# Patient Record
Sex: Female | Born: 1940 | Race: White | Hispanic: No | Marital: Married | Smoking: Never smoker
Health system: Southern US, Community
[De-identification: ages and names within clinical notes are randomized; demographics above are authoritative.]

## PROBLEM LIST (undated history)

## (undated) DIAGNOSIS — M51369 Other intervertebral disc degeneration, lumbar region without mention of lumbar back pain or lower extremity pain: Secondary | ICD-10-CM

## (undated) DIAGNOSIS — I4891 Unspecified atrial fibrillation: Secondary | ICD-10-CM

## (undated) DIAGNOSIS — G4730 Sleep apnea, unspecified: Secondary | ICD-10-CM

## (undated) DIAGNOSIS — R7303 Prediabetes: Secondary | ICD-10-CM

## (undated) DIAGNOSIS — Z87442 Personal history of urinary calculi: Secondary | ICD-10-CM

## (undated) DIAGNOSIS — Z8673 Personal history of transient ischemic attack (TIA), and cerebral infarction without residual deficits: Secondary | ICD-10-CM

## (undated) DIAGNOSIS — H9190 Unspecified hearing loss, unspecified ear: Secondary | ICD-10-CM

## (undated) DIAGNOSIS — I6789 Other cerebrovascular disease: Secondary | ICD-10-CM

## (undated) DIAGNOSIS — Z7901 Long term (current) use of anticoagulants: Secondary | ICD-10-CM

## (undated) DIAGNOSIS — F329 Major depressive disorder, single episode, unspecified: Secondary | ICD-10-CM

## (undated) DIAGNOSIS — K5790 Diverticulosis of intestine, part unspecified, without perforation or abscess without bleeding: Secondary | ICD-10-CM

## (undated) DIAGNOSIS — R011 Cardiac murmur, unspecified: Secondary | ICD-10-CM

## (undated) DIAGNOSIS — E785 Hyperlipidemia, unspecified: Secondary | ICD-10-CM

## (undated) DIAGNOSIS — M7122 Synovial cyst of popliteal space [Baker], left knee: Secondary | ICD-10-CM

## (undated) DIAGNOSIS — J4540 Moderate persistent asthma, uncomplicated: Secondary | ICD-10-CM

## (undated) DIAGNOSIS — F32A Depression, unspecified: Secondary | ICD-10-CM

## (undated) DIAGNOSIS — K219 Gastro-esophageal reflux disease without esophagitis: Secondary | ICD-10-CM

## (undated) DIAGNOSIS — M5030 Other cervical disc degeneration, unspecified cervical region: Secondary | ICD-10-CM

## (undated) DIAGNOSIS — I10 Essential (primary) hypertension: Secondary | ICD-10-CM

## (undated) DIAGNOSIS — I639 Cerebral infarction, unspecified: Secondary | ICD-10-CM

## (undated) DIAGNOSIS — K224 Dyskinesia of esophagus: Secondary | ICD-10-CM

## (undated) DIAGNOSIS — M1990 Unspecified osteoarthritis, unspecified site: Secondary | ICD-10-CM

## (undated) HISTORY — DX: Cerebral infarction, unspecified: I63.9

## (undated) HISTORY — PX: TONSILLECTOMY: SUR1361

## (undated) HISTORY — PX: CARDIAC CATHETERIZATION: SHX172

## (undated) HISTORY — DX: Unspecified atrial fibrillation: I48.91

## (undated) HISTORY — PX: COLONOSCOPY: SHX174

## (undated) SURGERY — Surgical Case
Anesthesia: *Unknown

---

## 2004-09-29 ENCOUNTER — Ambulatory Visit: Payer: Self-pay | Admitting: Internal Medicine

## 2005-11-28 ENCOUNTER — Ambulatory Visit: Payer: Self-pay | Admitting: Internal Medicine

## 2006-12-06 ENCOUNTER — Ambulatory Visit: Payer: Self-pay | Admitting: Internal Medicine

## 2007-01-02 ENCOUNTER — Ambulatory Visit: Payer: Self-pay | Admitting: Unknown Physician Specialty

## 2007-01-02 HISTORY — PX: COLONOSCOPY: SHX174

## 2007-07-28 ENCOUNTER — Inpatient Hospital Stay: Payer: Self-pay | Admitting: Internal Medicine

## 2007-07-28 ENCOUNTER — Other Ambulatory Visit: Payer: Self-pay

## 2007-08-02 ENCOUNTER — Ambulatory Visit: Payer: Self-pay | Admitting: Internal Medicine

## 2007-10-09 ENCOUNTER — Ambulatory Visit: Payer: Self-pay | Admitting: Unknown Physician Specialty

## 2007-10-09 HISTORY — PX: ESOPHAGOGASTRODUODENOSCOPY: SHX1529

## 2008-01-08 ENCOUNTER — Ambulatory Visit: Payer: Self-pay | Admitting: Internal Medicine

## 2009-01-08 ENCOUNTER — Ambulatory Visit: Payer: Self-pay | Admitting: Internal Medicine

## 2010-03-17 ENCOUNTER — Ambulatory Visit: Payer: Self-pay | Admitting: Internal Medicine

## 2010-11-23 ENCOUNTER — Ambulatory Visit: Payer: Self-pay | Admitting: Internal Medicine

## 2011-04-04 ENCOUNTER — Ambulatory Visit: Payer: Self-pay | Admitting: Internal Medicine

## 2012-04-04 ENCOUNTER — Ambulatory Visit: Payer: Self-pay | Admitting: Family Medicine

## 2013-05-05 ENCOUNTER — Ambulatory Visit: Payer: Self-pay | Admitting: Family Medicine

## 2014-05-06 ENCOUNTER — Ambulatory Visit: Payer: Self-pay | Admitting: Family Medicine

## 2014-11-13 DIAGNOSIS — Z9841 Cataract extraction status, right eye: Secondary | ICD-10-CM

## 2014-11-13 HISTORY — DX: Cataract extraction status, right eye: Z98.41

## 2015-09-09 ENCOUNTER — Ambulatory Visit: Payer: Medicare PPO | Admitting: Certified Registered Nurse Anesthetist

## 2015-09-09 ENCOUNTER — Encounter
Admission: RE | Disposition: A | Discharge status: Home / Self Care | Payer: Self-pay | Source: Ambulatory Visit | Attending: Ophthalmology

## 2015-09-09 ENCOUNTER — Ambulatory Visit
Admission: RE | Admit: 2015-09-09 | Discharge: 2015-09-09 | Disposition: A | Discharge status: Home / Self Care | Payer: Medicare PPO | Source: Ambulatory Visit | Attending: Ophthalmology | Admitting: Ophthalmology

## 2015-09-09 ENCOUNTER — Encounter: Payer: Self-pay | Admitting: *Deleted

## 2015-09-09 DIAGNOSIS — H269 Unspecified cataract: Secondary | ICD-10-CM | POA: Diagnosis present

## 2015-09-09 DIAGNOSIS — H9190 Unspecified hearing loss, unspecified ear: Secondary | ICD-10-CM | POA: Insufficient documentation

## 2015-09-09 DIAGNOSIS — Z7982 Long term (current) use of aspirin: Secondary | ICD-10-CM | POA: Insufficient documentation

## 2015-09-09 DIAGNOSIS — Z882 Allergy status to sulfonamides status: Secondary | ICD-10-CM | POA: Diagnosis not present

## 2015-09-09 DIAGNOSIS — F329 Major depressive disorder, single episode, unspecified: Secondary | ICD-10-CM | POA: Diagnosis not present

## 2015-09-09 DIAGNOSIS — Z9889 Other specified postprocedural states: Secondary | ICD-10-CM | POA: Insufficient documentation

## 2015-09-09 DIAGNOSIS — I10 Essential (primary) hypertension: Secondary | ICD-10-CM | POA: Diagnosis not present

## 2015-09-09 DIAGNOSIS — K219 Gastro-esophageal reflux disease without esophagitis: Secondary | ICD-10-CM | POA: Insufficient documentation

## 2015-09-09 DIAGNOSIS — H2512 Age-related nuclear cataract, left eye: Secondary | ICD-10-CM | POA: Diagnosis not present

## 2015-09-09 DIAGNOSIS — Z79899 Other long term (current) drug therapy: Secondary | ICD-10-CM | POA: Diagnosis not present

## 2015-09-09 HISTORY — DX: Gastro-esophageal reflux disease without esophagitis: K21.9

## 2015-09-09 HISTORY — DX: Cardiac murmur, unspecified: R01.1

## 2015-09-09 HISTORY — DX: Dyskinesia of esophagus: K22.4

## 2015-09-09 HISTORY — DX: Depression, unspecified: F32.A

## 2015-09-09 HISTORY — PX: CATARACT EXTRACTION W/PHACO: SHX586

## 2015-09-09 HISTORY — DX: Essential (primary) hypertension: I10

## 2015-09-09 HISTORY — DX: Major depressive disorder, single episode, unspecified: F32.9

## 2015-09-09 HISTORY — DX: Unspecified hearing loss, unspecified ear: H91.90

## 2015-09-09 SURGERY — PHACOEMULSIFICATION, CATARACT, WITH IOL INSERTION
Anesthesia: Monitor Anesthesia Care | Laterality: Left

## 2015-09-09 MED ORDER — BALANCED SALT IO SOLN
INTRAOCULAR | Status: DC | PRN
  Administered 2015-09-09: 200 mL via INTRAOCULAR

## 2015-09-09 MED ORDER — TETRACAINE HCL 0.5 % OP SOLN
1.0000 [drp] | Freq: Once | OPHTHALMIC | Status: AC
  Administered 2015-09-09: 1 [drp] via OPHTHALMIC

## 2015-09-09 MED ORDER — CYCLOPENTOLATE HCL 2 % OP SOLN
1.0000 [drp] | OPHTHALMIC | Status: DC
  Administered 2015-09-09 (×4): 1 [drp] via OPHTHALMIC

## 2015-09-09 MED ORDER — PHENYLEPHRINE HCL 10 % OP SOLN
OPHTHALMIC | Status: AC
  Administered 2015-09-09: 1 [drp] via OPHTHALMIC
  Filled 2015-09-09: qty 5

## 2015-09-09 MED ORDER — CYCLOPENTOLATE HCL 2 % OP SOLN
OPHTHALMIC | Status: AC
  Administered 2015-09-09: 1 [drp] via OPHTHALMIC
  Filled 2015-09-09: qty 2

## 2015-09-09 MED ORDER — SODIUM CHLORIDE 0.9 % IV SOLN
INTRAVENOUS | Status: DC
  Administered 2015-09-09: 07:00:00 via INTRAVENOUS

## 2015-09-09 MED ORDER — NA HYALUR & NA CHOND-NA HYALUR 0.4-0.35 ML IO KIT
PACK | INTRAOCULAR | Status: DC | PRN
  Administered 2015-09-09: 1 mL via INTRAOCULAR

## 2015-09-09 MED ORDER — EPINEPHRINE HCL 1 MG/ML IJ SOLN
INTRAMUSCULAR | Status: AC
  Filled 2015-09-09: qty 1

## 2015-09-09 MED ORDER — MOXIFLOXACIN HCL 0.5 % OP SOLN
OPHTHALMIC | Status: DC | PRN
  Administered 2015-09-09: 2 [drp] via OPHTHALMIC

## 2015-09-09 MED ORDER — CEFUROXIME OPHTHALMIC INJECTION 1 MG/0.1 ML
INJECTION | OPHTHALMIC | Status: DC | PRN
  Administered 2015-09-09: 0.1 mL via INTRACAMERAL

## 2015-09-09 MED ORDER — TETRACAINE HCL 0.5 % OP SOLN
OPHTHALMIC | Status: AC
  Administered 2015-09-09: 1 [drp] via OPHTHALMIC
  Filled 2015-09-09: qty 2

## 2015-09-09 MED ORDER — LIDOCAINE HCL (PF) 4 % IJ SOLN
INTRAMUSCULAR | Status: AC
  Filled 2015-09-09: qty 5

## 2015-09-09 MED ORDER — PHENYLEPHRINE HCL 10 % OP SOLN
1.0000 [drp] | OPHTHALMIC | Status: DC
  Administered 2015-09-09 (×4): 1 [drp] via OPHTHALMIC

## 2015-09-09 MED ORDER — NA CHONDROIT SULF-NA HYALURON 40-30 MG/ML IO SOLN
INTRAOCULAR | Status: DC | PRN
  Administered 2015-09-09: 0.75 mL via INTRAOCULAR

## 2015-09-09 MED ORDER — CEFUROXIME OPHTHALMIC INJECTION 1 MG/0.1 ML
INJECTION | OPHTHALMIC | Status: AC
  Filled 2015-09-09: qty 0.1

## 2015-09-09 MED ORDER — LIDOCAINE HCL (PF) 4 % IJ SOLN
INTRAOCULAR | Status: DC | PRN
  Administered 2015-09-09: .5 mL via OPHTHALMIC

## 2015-09-09 MED ORDER — FENTANYL CITRATE (PF) 100 MCG/2ML IJ SOLN
INTRAMUSCULAR | Status: DC | PRN
  Administered 2015-09-09: 50 ug via INTRAVENOUS

## 2015-09-09 MED ORDER — MOXIFLOXACIN HCL 0.5 % OP SOLN
1.0000 [drp] | OPHTHALMIC | Status: DC
  Administered 2015-09-09 (×3): 1 [drp] via OPHTHALMIC

## 2015-09-09 MED ORDER — MIDAZOLAM HCL 2 MG/2ML IJ SOLN
INTRAMUSCULAR | Status: DC | PRN
  Administered 2015-09-09: 1 mg via INTRAVENOUS

## 2015-09-09 MED ORDER — MOXIFLOXACIN HCL 0.5 % OP SOLN
OPHTHALMIC | Status: AC
  Administered 2015-09-09: 1 [drp] via OPHTHALMIC
  Filled 2015-09-09: qty 3

## 2015-09-09 MED ORDER — NA HYALUR & NA CHOND-NA HYALUR 0.55-0.5 ML IO KIT
PACK | INTRAOCULAR | Status: AC
  Filled 2015-09-09: qty 1.05

## 2015-09-09 SURGICAL SUPPLY — 22 items
CANNULA ANT/CHMB 27GA (MISCELLANEOUS) ×3 IMPLANT
CUP MEDICINE 2OZ PLAST GRAD ST (MISCELLANEOUS) ×3 IMPLANT
GLOVE BIO SURGEON STRL SZ7 (GLOVE) ×3 IMPLANT
GLOVE SURG LX 6.5 MICRO (GLOVE) ×4
GLOVE SURG LX STRL 6.5 MICRO (GLOVE) ×2 IMPLANT
GOWN STRL REUS W/ TWL LRG LVL3 (GOWN DISPOSABLE) ×2 IMPLANT
GOWN STRL REUS W/TWL LRG LVL3 (GOWN DISPOSABLE) ×4
LENS IOL ACRSF IQ PC 15.0 (Intraocular Lens) ×1 IMPLANT
LENS IOL ACRYSOF IQ POST 15.0 (Intraocular Lens) ×3 IMPLANT
NEEDLE FILTER BLUNT 18X 1/2SAF (NEEDLE) ×2
NEEDLE FILTER BLUNT 18X1 1/2 (NEEDLE) ×1 IMPLANT
PACK CATARACT (MISCELLANEOUS) ×3 IMPLANT
PACK CATARACT BRASINGTON LX (MISCELLANEOUS) ×3 IMPLANT
PACK EYE AFTER SURG (MISCELLANEOUS) ×3 IMPLANT
SOL BSS BAG (MISCELLANEOUS) ×3
SOL PREP PVP 2OZ (MISCELLANEOUS) ×3
SOLUTION BSS BAG (MISCELLANEOUS) ×1 IMPLANT
SOLUTION PREP PVP 2OZ (MISCELLANEOUS) ×1 IMPLANT
SYR 3ML LL SCALE MARK (SYRINGE) ×6 IMPLANT
SYR TB 1ML 27GX1/2 LL (SYRINGE) ×3 IMPLANT
WATER STERILE IRR 1000ML POUR (IV SOLUTION) ×3 IMPLANT
WIPE NON LINTING 3.25X3.25 (MISCELLANEOUS) ×3 IMPLANT

## 2015-09-09 NOTE — H&P (Signed)
The history and physical was faxed to the hospital. The history and physical was reviewed by me and no changes have occurred.   

## 2015-09-09 NOTE — Anesthesia Postprocedure Evaluation (Signed)
  Anesthesia Post-op Note  Patient: Emma Rivera  Procedure(s) Performed: Procedure(s) with comments: CATARACT EXTRACTION PHACO AND INTRAOCULAR LENS PLACEMENT (IOC) (Left) - US   1:10.5 AP     8.6 CDE  6.05 casette lot #  [id number]  Anesthesia type:MAC  Patient location: PACU  Post pain: Pain level controlled  Post assessment: Post-op Vital signs reviewed, Patient's Cardiovascular Status Stable, Respiratory Function Stable, Patent Airway and No signs of Nausea or vomiting  Post vital signs: Reviewed and stable  Last Vitals:  Filed Vitals:   09/09/15 0851  BP: 147/67  Pulse: 57  Temp: 36.8 C  Resp: 16    Level of consciousness: awake, alert  and patient cooperative  Complications: No apparent anesthesia complications  

## 2015-09-09 NOTE — Anesthesia Preprocedure Evaluation (Signed)
Anesthesia Evaluation  Patient identified by MRN, date of birth, ID band Patient awake    Airway Mallampati: III  TM Distance: >3 FB     Dental  (+) Teeth Intact   Pulmonary    breath sounds clear to auscultation       Cardiovascular hypertension,  Rhythm:Regular Rate:Normal     Neuro/Psych    GI/Hepatic   Endo/Other    Renal/GU      Musculoskeletal   Abdominal   Peds  Hematology   Anesthesia Other Findings   Reproductive/Obstetrics                             Anesthesia Physical Anesthesia Plan  ASA: II  Anesthesia Plan: MAC   Post-op Pain Management:    Induction:   Airway Management Planned: Nasal Cannula  Additional Equipment:   Intra-op Plan:   Post-operative Plan:   Informed Consent: I have reviewed the patients History and Physical, chart, labs and discussed the procedure including the risks, benefits and alternatives for the proposed anesthesia with the patient or authorized representative who has indicated his/her understanding and acceptance.     Plan Discussed with:   Anesthesia Plan Comments:         Anesthesia Quick Evaluation

## 2015-09-09 NOTE — Op Note (Signed)
09/09/2015  PRE-OP DIAGNOSIS: Cataract (ICD-10 H25.12) Nuclear sclerotic cataract, LEFT EYE  Post operative diagnosis: Cataract (ICD-10 H25.12) Nuclear sclerotic cataract, LEFT EYE  Procedure: Phacoemulsification with introcular lens [id number])   SURGEON: Surgeon(s) and Role:    * Lia HoppingNisha Marnita Poirier, MD - Primary  ANESTHESIA: Choice   ESTIMATED BLOOD LOSS: MINIMAL  COMPLICATIONS: None  OPERATIVE DESCRIPTION:  Therapeutic options were discussed with the patient preoperatively, including a discussion of risks and benefits of surgery.  Informed consent was obtained. A dilated fundus exam was performed within 6 months.   The patient was premedicated and brought to the operating room and placed on the operating table in the supine position.  Topical tetracaine was instilled.  After adequate anesthesia, the patient was prepped and draped in the usual fashion.  A wire lid speculum was inserted and the microscope was positioned.  A sideport was used to create a paracentesis site and a mixture of preservative-free lidocaine, BSS, and epinephrine was was instilled into the anterior chamber, followed by viscoelastic.  A clear corneal incision was created using a keratome blade.  Capsulorrhexis was then performed.  In situ phacoemulsification was performed.  Cortical material was removed with the irrigation-aspiration unit.  Viscoelastic was instilled to open the capsular bag.  A posterior chamber intraocular lens, model 15.0 diopters, was inserted and positioned.  Irrigation-aspiration was used to remove all viscoelastic. Intracameral cefuroxime was injected into the eye. Wounds were checked for leakage and confirmed to be secure.  Lid speculum was removed and a shield was placed over the eye.  Patient was returned to the recovery room in stable condition.  IMPLANTS:   Implant Name Type Inv. Item Serial No. Manufacturer Lot No. LRB No. Used  IMPLANT LENS SN60WF 15.0 - [id number] Intraocular  Lens IMPLANT LENS SN60WF 15.0 1219 ALCON   Left 1     Postoperative care and discharge medication counseling was discussed with the patient or the parents prior to discharge

## 2015-09-09 NOTE — Anesthesia Procedure Notes (Signed)
Procedure Name: MAC Performed by: Cailee Blanke Pre-anesthesia Checklist: Patient identified, Emergency Drugs available, Suction available, Patient being monitored and Timeout performed Oxygen Delivery Method: Nasal cannula       

## 2015-09-09 NOTE — Discharge Instructions (Signed)

## 2015-09-09 NOTE — Anesthesia Postprocedure Evaluation (Signed)
  Anesthesia Post-op Note  Patient: Emma Rivera  Procedure(s) Performed: Procedure(s) with comments: CATARACT EXTRACTION PHACO AND INTRAOCULAR LENS PLACEMENT (IOC) (Left) - US   1:10.5 AP     8.6 CDE  6.05 casette lot #  [id number]  Anesthesia type:MAC  Patient location: PACU  Post pain: Pain level controlled  Post assessment: Post-op Vital signs reviewed, Patient's Cardiovascular Status Stable, Respiratory Function Stable, Patent Airway and No signs of Nausea or vomiting  Post vital signs: Reviewed and stable  Last Vitals:  Filed Vitals:   09/09/15 0851  BP: 147/67  Pulse: 57  Temp: 36.8 C  Resp: 16    Level of consciousness: awake, alert  and patient cooperative  Complications: No apparent anesthesia complications

## 2015-09-09 NOTE — Transfer of Care (Signed)
Immediate Anesthesia Transfer of Care Note  Patient: Emma Rivera  Procedure(s) Performed: Procedure(s) with comments: CATARACT EXTRACTION PHACO AND INTRAOCULAR LENS PLACEMENT (IOC) (Left) - US   1:10.5 AP     8.6 CDE  6.05 casette lot #  [id number]  Patient Location: PACU  Anesthesia Type:MAC  Level of Consciousness: awake, alert  and oriented  Airway & Oxygen Therapy: Patient Spontanous Breathing and Patient connected to nasal cannula oxygen  Post-op Assessment: Report given to RN and Post -op Vital signs reviewed and stable  Post vital signs: Reviewed and stable  Last Vitals:  Filed Vitals:   09/09/15 0821  BP: 153/66  Pulse: 61  Temp: 36.8 C  Resp: 20    Complications: No apparent anesthesia complications

## 2015-09-27 ENCOUNTER — Encounter: Payer: Self-pay | Admitting: *Deleted

## 2015-09-30 ENCOUNTER — Ambulatory Visit: Payer: Medicare PPO | Admitting: Certified Registered Nurse Anesthetist

## 2015-09-30 ENCOUNTER — Ambulatory Visit
Admission: RE | Admit: 2015-09-30 | Discharge: 2015-09-30 | Disposition: A | Discharge status: Home / Self Care | Payer: Medicare PPO | Source: Ambulatory Visit | Attending: Ophthalmology | Admitting: Ophthalmology

## 2015-09-30 ENCOUNTER — Encounter
Admission: RE | Disposition: A | Discharge status: Home / Self Care | Payer: Self-pay | Source: Ambulatory Visit | Attending: Ophthalmology

## 2015-09-30 ENCOUNTER — Encounter: Payer: Self-pay | Admitting: *Deleted

## 2015-09-30 DIAGNOSIS — K219 Gastro-esophageal reflux disease without esophagitis: Secondary | ICD-10-CM | POA: Diagnosis not present

## 2015-09-30 DIAGNOSIS — F329 Major depressive disorder, single episode, unspecified: Secondary | ICD-10-CM | POA: Insufficient documentation

## 2015-09-30 DIAGNOSIS — H9193 Unspecified hearing loss, bilateral: Secondary | ICD-10-CM | POA: Diagnosis not present

## 2015-09-30 DIAGNOSIS — Z7982 Long term (current) use of aspirin: Secondary | ICD-10-CM | POA: Diagnosis not present

## 2015-09-30 DIAGNOSIS — Z79899 Other long term (current) drug therapy: Secondary | ICD-10-CM | POA: Insufficient documentation

## 2015-09-30 DIAGNOSIS — H269 Unspecified cataract: Secondary | ICD-10-CM | POA: Diagnosis present

## 2015-09-30 DIAGNOSIS — Z882 Allergy status to sulfonamides status: Secondary | ICD-10-CM | POA: Diagnosis not present

## 2015-09-30 DIAGNOSIS — I10 Essential (primary) hypertension: Secondary | ICD-10-CM | POA: Insufficient documentation

## 2015-09-30 DIAGNOSIS — R011 Cardiac murmur, unspecified: Secondary | ICD-10-CM | POA: Diagnosis not present

## 2015-09-30 DIAGNOSIS — Z9849 Cataract extraction status, unspecified eye: Secondary | ICD-10-CM | POA: Insufficient documentation

## 2015-09-30 DIAGNOSIS — H2511 Age-related nuclear cataract, right eye: Secondary | ICD-10-CM | POA: Diagnosis not present

## 2015-09-30 HISTORY — PX: CATARACT EXTRACTION W/PHACO: SHX586

## 2015-09-30 SURGERY — PHACOEMULSIFICATION, CATARACT, WITH IOL INSERTION
Anesthesia: Monitor Anesthesia Care | Laterality: Right | Wound class: Clean

## 2015-09-30 MED ORDER — TETRACAINE HCL 0.5 % OP SOLN
1.0000 [drp] | OPHTHALMIC | Status: DC | PRN
  Administered 2015-09-30: 1 [drp] via OPHTHALMIC

## 2015-09-30 MED ORDER — NA HYALUR & NA CHOND-NA HYALUR 0.55-0.5 ML IO KIT
PACK | INTRAOCULAR | Status: AC
  Filled 2015-09-30: qty 1.05

## 2015-09-30 MED ORDER — MIDAZOLAM HCL 2 MG/2ML IJ SOLN
INTRAMUSCULAR | Status: DC | PRN
  Administered 2015-09-30: 0.5 mg via INTRAVENOUS

## 2015-09-30 MED ORDER — BALANCED SALT IO SOLN
INTRAOCULAR | Status: DC | PRN
  Administered 2015-09-30: 200 mL via INTRAOCULAR

## 2015-09-30 MED ORDER — FENTANYL CITRATE (PF) 100 MCG/2ML IJ SOLN
INTRAMUSCULAR | Status: DC | PRN
  Administered 2015-09-30: 25 ug via INTRAVENOUS

## 2015-09-30 MED ORDER — CEFUROXIME OPHTHALMIC INJECTION 1 MG/0.1 ML
INJECTION | OPHTHALMIC | Status: AC
  Filled 2015-09-30: qty 0.1

## 2015-09-30 MED ORDER — PHENYLEPHRINE HCL 10 % OP SOLN
1.0000 [drp] | OPHTHALMIC | Status: AC | PRN
  Administered 2015-09-30 (×4): 1 [drp] via OPHTHALMIC

## 2015-09-30 MED ORDER — PHENYLEPHRINE HCL 10 % OP SOLN
OPHTHALMIC | Status: AC
  Filled 2015-09-30: qty 5

## 2015-09-30 MED ORDER — ONDANSETRON HCL 4 MG/2ML IJ SOLN: 4.0000 mg | Freq: Once | INTRAMUSCULAR | Status: DC | PRN

## 2015-09-30 MED ORDER — FENTANYL CITRATE (PF) 100 MCG/2ML IJ SOLN: 25.0000 ug | INTRAMUSCULAR | Status: DC | PRN

## 2015-09-30 MED ORDER — CYCLOPENTOLATE HCL 2 % OP SOLN
OPHTHALMIC | Status: AC
  Filled 2015-09-30: qty 2

## 2015-09-30 MED ORDER — CYCLOPENTOLATE HCL 2 % OP SOLN
1.0000 [drp] | OPHTHALMIC | Status: AC | PRN
  Administered 2015-09-30 (×4): 1 [drp] via OPHTHALMIC

## 2015-09-30 MED ORDER — CEFUROXIME OPHTHALMIC INJECTION 1 MG/0.1 ML
INJECTION | OPHTHALMIC | Status: DC | PRN
  Administered 2015-09-30: 1 mg via INTRACAMERAL

## 2015-09-30 MED ORDER — MOXIFLOXACIN HCL 0.5 % OP SOLN
OPHTHALMIC | Status: DC | PRN
  Administered 2015-09-30: 1 [drp]

## 2015-09-30 MED ORDER — LIDOCAINE HCL (PF) 4 % IJ SOLN
INTRAOCULAR | Status: DC | PRN
  Administered 2015-09-30: 4 mL

## 2015-09-30 MED ORDER — NA HYALUR & NA CHOND-NA HYALUR 0.4-0.35 ML IO KIT
PACK | INTRAOCULAR | Status: DC | PRN
  Administered 2015-09-30: .35 mL via INTRAOCULAR

## 2015-09-30 MED ORDER — SODIUM CHLORIDE 0.9 % IV SOLN
INTRAVENOUS | Status: DC
  Administered 2015-09-30: 10:00:00 via INTRAVENOUS

## 2015-09-30 MED ORDER — MOXIFLOXACIN HCL 0.5 % OP SOLN
OPHTHALMIC | Status: AC
  Filled 2015-09-30: qty 3

## 2015-09-30 MED ORDER — EPINEPHRINE HCL 1 MG/ML IJ SOLN
INTRAMUSCULAR | Status: AC
  Filled 2015-09-30: qty 1

## 2015-09-30 MED ORDER — MOXIFLOXACIN HCL 0.5 % OP SOLN
1.0000 [drp] | OPHTHALMIC | Status: AC | PRN
  Administered 2015-09-30 (×3): 1 [drp] via OPHTHALMIC

## 2015-09-30 MED ORDER — LIDOCAINE HCL (PF) 4 % IJ SOLN
INTRAMUSCULAR | Status: AC
  Filled 2015-09-30: qty 5

## 2015-09-30 MED ORDER — TETRACAINE HCL 0.5 % OP SOLN
OPHTHALMIC | Status: AC
  Filled 2015-09-30: qty 2

## 2015-09-30 SURGICAL SUPPLY — 22 items
CANNULA ANT/CHMB 27GA (MISCELLANEOUS) ×3 IMPLANT
CUP MEDICINE 2OZ PLAST GRAD ST (MISCELLANEOUS) ×3 IMPLANT
GLOVE BIO SURGEON STRL SZ7 (GLOVE) ×3 IMPLANT
GLOVE SURG LX 6.5 MICRO (GLOVE) ×4
GLOVE SURG LX STRL 6.5 MICRO (GLOVE) ×2 IMPLANT
GOWN STRL REUS W/ TWL LRG LVL3 (GOWN DISPOSABLE) ×2 IMPLANT
GOWN STRL REUS W/TWL LRG LVL3 (GOWN DISPOSABLE) ×4
LENS IOL ACRSF IQ PC 15.5 (Intraocular Lens) ×1 IMPLANT
LENS IOL ACRYSOF IQ POST 15.5 (Intraocular Lens) ×3 IMPLANT
NEEDLE FILTER BLUNT 18X 1/2SAF (NEEDLE) ×2
NEEDLE FILTER BLUNT 18X1 1/2 (NEEDLE) ×1 IMPLANT
PACK CATARACT (MISCELLANEOUS) ×3 IMPLANT
PACK CATARACT BRASINGTON LX (MISCELLANEOUS) ×3 IMPLANT
PACK EYE AFTER SURG (MISCELLANEOUS) ×3 IMPLANT
SOL BSS BAG (MISCELLANEOUS) ×3
SOL PREP PVP 2OZ (MISCELLANEOUS) ×3
SOLUTION BSS BAG (MISCELLANEOUS) ×1 IMPLANT
SOLUTION PREP PVP 2OZ (MISCELLANEOUS) ×1 IMPLANT
SYR 3ML LL SCALE MARK (SYRINGE) ×6 IMPLANT
SYR TB 1ML 27GX1/2 LL (SYRINGE) ×3 IMPLANT
WATER STERILE IRR 1000ML POUR (IV SOLUTION) ×3 IMPLANT
WIPE NON LINTING 3.25X3.25 (MISCELLANEOUS) ×3 IMPLANT

## 2015-09-30 NOTE — H&P (Signed)
The history and physical was faxed to the hospital. The history and physical was reviewed by me and no changes have occurred.   

## 2015-09-30 NOTE — Op Note (Signed)
  09/30/2015  PRE-OP DIAGNOSIS: Cataract (ICD-10 H25.11) Nuclear sclerotic catarct, RIGHT EYE  Post operative diagnosis: Cataract (ICD-10 H25.11) Nuclear sclerotic cataract, RIGHT EYE  Procedure: Phacoemulsification with introcular lens [id number]([id number])   SURGEON: Surgeon(s) and Role:    * Lia HoppingNisha Hamna Asa, MD - Primary  ANESTHESIA: Choice   ESTIMATED BLOOD LOSS: MINIMAL  COMPLICATIONS: None  OPERATIVE DESCRIPTION:   Therapeutic options were discussed with the patient preoperatively, including a discussion of risks and benefits of surgery.  Informed consent was obtained. A dilated fundus exam was performed within 6 months.   The patient was premedicated and brought to the operating room and placed on the operating table in the supine position.  Topical tetracaine was instilled.  After adequate anesthesia, the patient was prepped and draped in the usual fashion.  A wire lid speculum was inserted and the microscope was positioned.  A sideport was used to create a paracentesis site and a mixture of preservative-free lidocaine, BSS, and epinephrine was was instilled into the anterior chamber, followed by viscoelastic.  A clear corneal incision was created using a keratome blade.  Capsulorrhexis was then performed.  In situ phacoemulsification was performed.  Cortical material was removed with the irrigation-aspiration unit.  Viscoelastic was instilled to open the capsular bag.  A posterior chamber intraocular lens, model 15.5 diopters, was inserted and positioned.  Irrigation-aspiration was used to remove all viscoelastic. Intracameral cefuroxime was injected into the eye. Wounds were checked for leakage and confirmed to be secure.  Lid speculum was removed and a shield was placed over the eye.  Patient was returned to the recovery room in stable condition. IMPLANTS:   Implant Name Type Inv. Item Serial No. Manufacturer Lot No. LRB No. Used  IMPLANT LENS SN60WF 15.5 - [id number] Intraocular  Lens IMPLANT LENS SN60WF 15.5 [id number] ALCON   Right 1     Postoperative care and discharge medication counseling was discussed with the patient or the parents prior to discharge

## 2015-09-30 NOTE — Anesthesia Preprocedure Evaluation (Signed)
Anesthesia Evaluation  Patient identified by MRN, date of birth, ID band Patient awake    Reviewed: Allergy & Precautions, NPO status , Patient's Chart, lab work & pertinent test results  Airway Mallampati: II  TM Distance: >3 FB Neck ROM: Full    Dental  (+) Caps   Pulmonary neg pulmonary ROS,    Pulmonary exam normal breath sounds clear to auscultation       Cardiovascular hypertension, Pt. on medications Normal cardiovascular exam+ Valvular Problems/Murmurs   Heart murmur   Neuro/Psych Depression    GI/Hepatic Neg liver ROS, GERD  Medicated and Controlled,  Endo/Other  negative endocrine ROS  Renal/GU negative Renal ROS  negative genitourinary   Musculoskeletal negative musculoskeletal ROS (+)   Abdominal Normal abdominal exam  (+)   Peds negative pediatric ROS (+)  Hematology negative hematology ROS (+)   Anesthesia Other Findings   Reproductive/Obstetrics                             Anesthesia Physical Anesthesia Plan  ASA: II  Anesthesia Plan: MAC   Post-op Pain Management:    Induction: Intravenous  Airway Management Planned: Nasal Cannula  Additional Equipment:   Intra-op Plan:   Post-operative Plan:   Informed Consent: I have reviewed the patients History and Physical, chart, labs and discussed the procedure including the risks, benefits and alternatives for the proposed anesthesia with the patient or authorized representative who has indicated his/her understanding and acceptance.   Dental advisory given  Plan Discussed with: CRNA and Surgeon  Anesthesia Plan Comments:         Anesthesia Quick Evaluation

## 2015-09-30 NOTE — Transfer of Care (Signed)
Immediate Anesthesia Transfer of Care Note  Patient: Emma Rivera  Procedure(s) Performed: Procedure(s) with comments: CATARACT EXTRACTION PHACO AND INTRAOCULAR LENS PLACEMENT (IOC) (Right) - US 01:00.3 AP%: 11.6 CDE: 7.00 FLUID LOT# [id number]  Patient Location: PACU  Anesthesia Type:MAC  Level of Consciousness: awake  Airway & Oxygen Therapy: Patient Spontanous Breathing  Post-op Assessment: Report given to RN and Post -op Vital signs reviewed and stable  Post vital signs: Reviewed and stable  Last Vitals:  Filed Vitals:   09/30/15 1140  BP:   Pulse:   Temp:   Resp: 16    Complications: No apparent anesthesia complications

## 2015-09-30 NOTE — Discharge Instructions (Signed)
AMBULATORY SURGERY  DISCHARGE INSTRUCTIONS   1) The drugs that you were given will stay in your system until tomorrow so for the next 24 hours you should not:  A) Drive an automobile B) Make any legal decisions C) Drink any alcoholic beverage   2) You may resume regular meals tomorrow.  Today it is better to start with liquids and gradually work up to solid foods.  You may eat anything you prefer, but it is better to start with liquids, then soup and crackers, and gradually work up to solid foods.   3) Please notify your doctor immediately if you have any unusual bleeding, trouble breathing, redness and pain at the surgery site, drainage, fever, or pain not relieved by medication.    4) Additional Instructions:   Eye Surgery Discharge Instructions  Expect mild scratchy sensation or mild soreness. DO NOT RUB YOUR EYE!  The day of surgery:  Minimal physical activity, but bed rest is not required  No reading, computer work, or close hand work  No bending, lifting, or straining.  May watch TV  For 24 hours:  No driving, legal decisions, or alcoholic beverages  Safety precautions  Eat anything you prefer: It is better to start with liquids, then soup then solid foods.  _____ Eye patch should be worn until postoperative exam tomorrow.  ____ Solar shield eyeglasses should be worn for comfort in the sunlight/patch while sleeping  Resume all regular medications including aspirin or Coumadin if these were discontinued prior to surgery. You may shower, bathe, shave, or wash your hair. Tylenol may be taken for mild discomfort.  Call your doctor if you experience significant pain, nausea, or vomiting, fever > 101 or other signs of infection. [phone] or [phone] Specific instructions:  Follow-up Information    Follow up with Lia HoppingNisha Mukherjee, MD.   Specialty:  Ophthalmology   Why:  November 18 at 9:35am   Contact information:   [address] [place]  [id number] [phone]          Please contact your physician with any problems or Same Day Surgery at [phone], Monday through Friday 6 am to 4 pm, or [place] [facility] Main number at [phone].

## 2015-09-30 NOTE — Anesthesia Postprocedure Evaluation (Signed)
  Anesthesia Post-op Note  Patient: Emma Rivera  Procedure(s) Performed: Procedure(s) with comments: CATARACT EXTRACTION PHACO AND INTRAOCULAR LENS PLACEMENT (IOC) (Right) - US 01:00.3 AP%: 11.6 CDE: 7.00 FLUID LOT# [id number]  Anesthesia type:MAC  Patient location: PACU  Post pain: Pain level controlled  Post assessment: Post-op Vital signs reviewed, Patient's Cardiovascular Status Stable, Respiratory Function Stable, Patent Airway and No signs of Nausea or vomiting  Post vital signs: Reviewed and stable  Last Vitals:  Filed Vitals:   09/30/15 1140  BP:   Pulse:   Temp:   Resp: 16    Level of consciousness: awake, alert  and patient cooperative  Complications: No apparent anesthesia complications

## 2015-12-21 ENCOUNTER — Other Ambulatory Visit: Payer: Self-pay | Admitting: Family Medicine

## 2015-12-21 DIAGNOSIS — Z1231 Encounter for screening mammogram for malignant neoplasm of breast: Secondary | ICD-10-CM

## 2015-12-31 ENCOUNTER — Ambulatory Visit: Payer: Medicare PPO

## 2016-01-06 ENCOUNTER — Ambulatory Visit
Admission: RE | Admit: 2016-01-06 | Discharge: 2016-01-06 | Disposition: A | Discharge status: Home / Self Care | Payer: Medicare Other | Source: Ambulatory Visit | Attending: Family Medicine | Admitting: Family Medicine

## 2016-01-06 ENCOUNTER — Other Ambulatory Visit: Payer: Self-pay | Admitting: Family Medicine

## 2016-01-06 DIAGNOSIS — Z1231 Encounter for screening mammogram for malignant neoplasm of breast: Secondary | ICD-10-CM | POA: Insufficient documentation

## 2016-11-28 ENCOUNTER — Other Ambulatory Visit: Payer: Self-pay | Admitting: Family Medicine

## 2016-11-28 DIAGNOSIS — Z1231 Encounter for screening mammogram for malignant neoplasm of breast: Secondary | ICD-10-CM

## 2017-01-08 ENCOUNTER — Ambulatory Visit
Admission: RE | Admit: 2017-01-08 | Discharge: 2017-01-08 | Disposition: A | Discharge status: Home / Self Care | Payer: Medicare Other | Source: Ambulatory Visit | Attending: Family Medicine | Admitting: Family Medicine

## 2017-01-08 DIAGNOSIS — Z1231 Encounter for screening mammogram for malignant neoplasm of breast: Secondary | ICD-10-CM

## 2017-04-03 ENCOUNTER — Encounter: Payer: Self-pay | Admitting: *Deleted

## 2017-04-04 ENCOUNTER — Ambulatory Visit: Payer: Medicare Other | Admitting: Anesthesiology

## 2017-04-04 ENCOUNTER — Encounter
Admission: RE | Disposition: A | Discharge status: Home / Self Care | Payer: Self-pay | Source: Ambulatory Visit | Attending: Unknown Physician Specialty

## 2017-04-04 ENCOUNTER — Ambulatory Visit
Admission: RE | Admit: 2017-04-04 | Discharge: 2017-04-04 | Disposition: A | Discharge status: Home / Self Care | Payer: Medicare Other | Source: Ambulatory Visit | Attending: Unknown Physician Specialty | Admitting: Unknown Physician Specialty

## 2017-04-04 DIAGNOSIS — K5730 Diverticulosis of large intestine without perforation or abscess without bleeding: Secondary | ICD-10-CM | POA: Insufficient documentation

## 2017-04-04 DIAGNOSIS — K219 Gastro-esophageal reflux disease without esophagitis: Secondary | ICD-10-CM | POA: Diagnosis not present

## 2017-04-04 DIAGNOSIS — Z1211 Encounter for screening for malignant neoplasm of colon: Secondary | ICD-10-CM | POA: Insufficient documentation

## 2017-04-04 DIAGNOSIS — R7303 Prediabetes: Secondary | ICD-10-CM | POA: Insufficient documentation

## 2017-04-04 DIAGNOSIS — F329 Major depressive disorder, single episode, unspecified: Secondary | ICD-10-CM | POA: Diagnosis not present

## 2017-04-04 DIAGNOSIS — Z79899 Other long term (current) drug therapy: Secondary | ICD-10-CM | POA: Diagnosis not present

## 2017-04-04 DIAGNOSIS — Z7982 Long term (current) use of aspirin: Secondary | ICD-10-CM | POA: Insufficient documentation

## 2017-04-04 DIAGNOSIS — Z803 Family history of malignant neoplasm of breast: Secondary | ICD-10-CM | POA: Insufficient documentation

## 2017-04-04 DIAGNOSIS — K640 First degree hemorrhoids: Secondary | ICD-10-CM | POA: Diagnosis not present

## 2017-04-04 DIAGNOSIS — I10 Essential (primary) hypertension: Secondary | ICD-10-CM | POA: Diagnosis not present

## 2017-04-04 HISTORY — PX: COLONOSCOPY WITH PROPOFOL: SHX5780

## 2017-04-04 HISTORY — DX: Prediabetes: R73.03

## 2017-04-04 SURGERY — COLONOSCOPY WITH PROPOFOL
Anesthesia: General

## 2017-04-04 MED ORDER — SODIUM CHLORIDE 0.9 % IV SOLN: INTRAVENOUS | Status: DC

## 2017-04-04 MED ORDER — GLYCOPYRROLATE 0.2 MG/ML IJ SOLN
INTRAMUSCULAR | Status: AC
  Filled 2017-04-04: qty 1

## 2017-04-04 MED ORDER — PROPOFOL 10 MG/ML IV BOLUS
INTRAVENOUS | Status: AC
  Filled 2017-04-04: qty 20

## 2017-04-04 MED ORDER — GLYCOPYRROLATE 0.2 MG/ML IJ SOLN
INTRAMUSCULAR | Status: DC | PRN
  Administered 2017-04-04: 0.2 mg via INTRAVENOUS

## 2017-04-04 MED ORDER — SODIUM CHLORIDE 0.9 % IV SOLN
INTRAVENOUS | Status: DC
  Administered 2017-04-04: 10:00:00 via INTRAVENOUS

## 2017-04-04 MED ORDER — PROPOFOL 500 MG/50ML IV EMUL
INTRAVENOUS | Status: DC | PRN
  Administered 2017-04-04: 90 ug/kg/min via INTRAVENOUS

## 2017-04-04 MED ORDER — PROPOFOL 10 MG/ML IV BOLUS
INTRAVENOUS | Status: DC | PRN
  Administered 2017-04-04: 80 mg via INTRAVENOUS

## 2017-04-04 NOTE — Anesthesia Preprocedure Evaluation (Signed)
Anesthesia Evaluation  Patient identified by MRN, date of birth, ID band Patient awake    Reviewed: Allergy & Precautions, NPO status , Patient's Chart, lab work & pertinent test results  History of Anesthesia Complications Negative for: history of anesthetic complications  Airway Mallampati: II  TM Distance: >3 FB Neck ROM: Full    Dental  (+) Caps   Pulmonary neg pulmonary ROS,           Cardiovascular Exercise Tolerance: Good hypertension, Pt. on medications (-) angina(-) CAD, (-) Past MI, (-) Cardiac Stents and (-) CABG (-) dysrhythmias + Valvular Problems/Murmurs   Heart murmur   Neuro/Psych PSYCHIATRIC DISORDERS    GI/Hepatic Neg liver ROS, GERD  Medicated and Controlled,  Endo/Other  diabetes (Borderline)  Renal/GU negative Renal ROS  negative genitourinary   Musculoskeletal negative musculoskeletal ROS (+)   Abdominal Normal abdominal exam  (+)   Peds negative pediatric ROS (+)  Hematology negative hematology ROS (+)   Anesthesia Other Findings Past Medical History: No date: Depression No date: Esophageal spasm No date: Esophageal spasm No date: GERD (gastroesophageal reflux disease) No date: Heart murmur No date: HOH (hard of hearing) No date: Hypertension No date: Pre-diabetes   Reproductive/Obstetrics negative OB ROS                             Anesthesia Physical  Anesthesia Plan  ASA: II  Anesthesia Plan: General   Post-op Pain Management:    Induction: Intravenous  Airway Management Planned: Nasal Cannula  Additional Equipment:   Intra-op Plan:   Post-operative Plan:   Informed Consent: I have reviewed the patients History and Physical, chart, labs and discussed the procedure including the risks, benefits and alternatives for the proposed anesthesia with the patient or authorized representative who has indicated his/her understanding and acceptance.    Dental advisory given  Plan Discussed with: CRNA and Surgeon  Anesthesia Plan Comments:         Anesthesia Quick Evaluation

## 2017-04-04 NOTE — Transfer of Care (Signed)
Immediate Anesthesia Transfer of Care Note  Patient: Emma Rivera  Procedure(s) Performed: Procedure(s): COLONOSCOPY WITH PROPOFOL (N/A)  Patient Location: PACU and Endoscopy Unit  Anesthesia Type:General  Level of Consciousness: awake, alert  and oriented  Airway & Oxygen Therapy: Patient Spontanous Breathing and Patient connected to nasal cannula oxygen  Post-op Assessment: Report given to RN and Post -op Vital signs reviewed and stable  Post vital signs: Reviewed and stable  Last Vitals:  Vitals:   04/04/17 1123  BP: (!) 97/40  Pulse: (!) 48  Resp: 14  Temp: (!) 35.8 C    Last Pain:  Vitals:   04/04/17 1123  TempSrc: Tympanic         Complications: No apparent anesthesia complications

## 2017-04-04 NOTE — H&P (Signed)
Primary Care Physician:  Marisue IvanLinthavong, Kanhka, MD Primary Gastroenterologist:  Dr. Mechele CollinElliott  Pre-Procedure History & Physical: HPI:  Emma Rivera is a 76 y.o. female is here for an colonoscopy.   Past Medical History:  Diagnosis Date  . Depression   . Esophageal spasm   . Esophageal spasm   . GERD (gastroesophageal reflux disease)   . Heart murmur   . HOH (hard of hearing)   . Hypertension   . Pre-diabetes     Past Surgical History:  Procedure Laterality Date  . CARDIAC CATHETERIZATION    . CATARACT EXTRACTION W/PHACO Left 09/09/2015   Procedure: CATARACT EXTRACTION PHACO AND INTRAOCULAR LENS PLACEMENT (IOC);  Surgeon: Lia HoppingNisha Mukherjee, MD;  Location: [facility] ORS;  Service: Ophthalmology;  Laterality: Left;  US   1:10.5 AP     8.6 CDE  6.05 casette lot #  [id number]  . CATARACT EXTRACTION W/PHACO Right 09/30/2015   Procedure: CATARACT EXTRACTION PHACO AND INTRAOCULAR LENS PLACEMENT (IOC);  Surgeon: Lia HoppingNisha Mukherjee, MD;  Location: [facility] ORS;  Service: Ophthalmology;  Laterality: Right;  US 01:00.3 AP%: 11.6 CDE: 7.00 FLUID LOT# [id number] H  . COLONOSCOPY    . TONSILLECTOMY      Prior to Admission medications   Medication Sig Start Date End Date Taking? Authorizing Provider  amLODipine (NORVASC) 5 MG tablet Take 5 mg by mouth daily.   Yes [provider]  aspirin 81 MG tablet Take 81 mg by mouth daily.   Yes [provider]  citalopram (CELEXA) 20 MG tablet Take 20 mg by mouth daily.   Yes [provider]  losartan-hydrochlorothiazide (HYZAAR) 100-25 MG tablet Take 1 tablet by mouth daily.   Yes [provider]  omeprazole (PRILOSEC) 20 MG capsule Take 20 mg by mouth daily.   Yes [provider]  tolterodine (DETROL LA) 4 MG 24 hr capsule Take 4 mg by mouth daily.   Yes [provider]  Multiple Vitamin (MULTIVITAMIN) tablet Take 1 tablet by mouth daily.    [provider]    Allergies as of 01/22/2017 - Review Complete  09/30/2015  Allergen Reaction Noted  . Sulfa antibiotics  09/03/2015    Family History  Problem Relation Age of Onset  . Breast cancer Maternal Aunt     Social History   Social History  . Marital status: Married    Spouse name: N/A  . Number of children: N/A  . Years of education: N/A   Occupational History  . Not on file.   Social History Main Topics  . Smoking status: Never Smoker  . Smokeless tobacco: Never Used  . Alcohol use Yes     Comment: occasional  . Drug use: No  . Sexual activity: Not on file   Other Topics Concern  . Not on file   Social History Narrative  . No narrative on file    Review of Systems: See HPI, otherwise negative ROS  Physical Exam: There were no vitals taken for this visit. General:   Alert,  pleasant and cooperative in NAD Head:  Normocephalic and atraumatic. Neck:  Supple; no masses or thyromegaly. Lungs:  Clear throughout to auscultation.    Heart:  Regular rate and rhythm. Abdomen:  Soft, nontender and nondistended. Normal bowel sounds, without guarding, and without rebound.   Neurologic:  Alert and  oriented x4;  grossly normal neurologically.  Impression/Plan: Emma Rivera is here for an colonoscopy to be performed for screening colonoscopy  Risks, benefits, limitations, and alternatives regarding  colonoscopy have been reviewed with the patient.  Questions have been answered.  All parties agreeable.   Lynnae Prude, MD  04/04/2017, 11:01 AM

## 2017-04-04 NOTE — Op Note (Signed)
[facility] Gastroenterology Patient Name: Emma Rivera Procedure Date: 04/04/2017 11:01 AM MRN: [id number] Account #: [id number] Date of Birth: December 05, 1940 Admit Type: Outpatient Age: 77 Room: [facility] ENDO ROOM 3 Gender: Female Note Status: Finalized Procedure:            Colonoscopy Indications:          Screening for colorectal malignant neoplasm Providers:            Scot Jun, MD Referring MD:         [name] (Referring MD) Medicines:            Propofol per Anesthesia Complications:        No immediate complications. Procedure:            Pre-Anesthesia Assessment:                       - After reviewing the risks and benefits, the patient                        was deemed in satisfactory condition to undergo the                        procedure.                       After obtaining informed consent, the colonoscope was                        passed under direct vision. Throughout the procedure,                        the patient's blood pressure, pulse, and oxygen                        saturations were monitored continuously. The                        Colonoscope was introduced through the anus and                        advanced to the the cecum, identified by appendiceal                        orifice and ileocecal valve. The colonoscopy was                        performed without difficulty. The patient tolerated the                        procedure well. The quality of the bowel preparation                        was excellent. Findings:      Multiple small-medium-mouthed diverticula were found in the sigmoid       colon, descending colon, transverse colon and ascending colon.      Internal hemorrhoids were found during endoscopy. The hemorrhoids were       small and Grade I (internal hemorrhoids that do not prolapse).      The exam was otherwise without abnormality. Impression:           - Diverticulosis in the  sigmoid colon, in the                        descending colon, in the transverse colon and in the                        ascending colon.                       - Internal hemorrhoids.                       - The examination was otherwise normal.                       - No specimens collected. Recommendation:       No routine repeat colonoscopy needed.                       - The findings and recommendations were discussed with                        the patient's family. Scot Junobert T Kalid Ghan, MD 04/04/2017 11:22:21 AM This report has been signed electronically. Number of Addenda: 0 Note Initiated On: 04/04/2017 11:01 AM Scope Withdrawal Time: 0 hours 6 minutes 42 seconds  Total Procedure Duration: 0 hours 13 minutes 30 seconds       [facility]

## 2017-04-04 NOTE — Anesthesia Post-op Follow-up Note (Cosign Needed)
Anesthesia QCDR form completed.        

## 2017-04-05 ENCOUNTER — Encounter: Payer: Self-pay | Admitting: Unknown Physician Specialty

## 2017-04-05 NOTE — Anesthesia Postprocedure Evaluation (Signed)
Anesthesia Post Note  Patient: Emma Rivera  Procedure(s) Performed: Procedure(s) (LRB): COLONOSCOPY WITH PROPOFOL (N/A)  Patient location during evaluation: Endoscopy Anesthesia Type: General Level of consciousness: awake and alert Pain management: pain level controlled Vital Signs Assessment: post-procedure vital signs reviewed and stable Respiratory status: spontaneous breathing, nonlabored ventilation, respiratory function stable and patient connected to nasal cannula oxygen Cardiovascular status: blood pressure returned to baseline and stable Postop Assessment: no signs of nausea or vomiting Anesthetic complications: no     Last Vitals:  Vitals:   04/04/17 1143 04/04/17 1153  BP: 135/69 (!) 144/61  Pulse: (!) 53 (!) 52  Resp: 17 12  Temp:      Last Pain:  Vitals:   04/05/17 0752  TempSrc:   PainSc: 0-No pain                 Lenard SimmerAndrew Kalandra Masters

## 2017-11-29 ENCOUNTER — Other Ambulatory Visit: Payer: Self-pay | Admitting: Family Medicine

## 2017-11-29 DIAGNOSIS — Z1231 Encounter for screening mammogram for malignant neoplasm of breast: Secondary | ICD-10-CM

## 2018-02-19 ENCOUNTER — Ambulatory Visit
Admission: RE | Admit: 2018-02-19 | Discharge: 2018-02-19 | Disposition: A | Discharge status: Home / Self Care | Payer: Medicare Other | Source: Ambulatory Visit | Attending: Family Medicine | Admitting: Family Medicine

## 2018-02-19 DIAGNOSIS — Z1231 Encounter for screening mammogram for malignant neoplasm of breast: Secondary | ICD-10-CM | POA: Insufficient documentation

## 2018-07-10 ENCOUNTER — Other Ambulatory Visit: Payer: Self-pay | Admitting: Family Medicine

## 2018-07-10 DIAGNOSIS — N644 Mastodynia: Secondary | ICD-10-CM

## 2018-07-18 ENCOUNTER — Ambulatory Visit
Admission: RE | Admit: 2018-07-18 | Discharge: 2018-07-18 | Disposition: A | Discharge status: Home / Self Care | Payer: Medicare Other | Source: Ambulatory Visit | Attending: Family Medicine | Admitting: Family Medicine

## 2018-07-18 DIAGNOSIS — N644 Mastodynia: Secondary | ICD-10-CM

## 2018-12-14 DIAGNOSIS — I639 Cerebral infarction, unspecified: Secondary | ICD-10-CM

## 2018-12-14 DIAGNOSIS — I4891 Unspecified atrial fibrillation: Secondary | ICD-10-CM

## 2018-12-14 HISTORY — DX: Unspecified atrial fibrillation: I48.91

## 2018-12-14 HISTORY — DX: Cerebral infarction, unspecified: I63.9

## 2018-12-16 ENCOUNTER — Inpatient Hospital Stay (HOSPITAL_COMMUNITY)
Admission: EM | Admit: 2018-12-16 | Discharge: 2018-12-18 | DRG: 065 | Disposition: A | Discharge status: Home / Self Care | Payer: Medicare Other | Attending: Neurology | Admitting: Neurology

## 2018-12-16 ENCOUNTER — Emergency Department: Payer: Medicare Other

## 2018-12-16 ENCOUNTER — Other Ambulatory Visit: Payer: Self-pay

## 2018-12-16 ENCOUNTER — Emergency Department
Admission: EM | Admit: 2018-12-16 | Discharge: 2018-12-16 | Disposition: A | Discharge status: Other Acute Inpatient Hospital | Payer: Medicare Other | Attending: Emergency Medicine | Admitting: Emergency Medicine

## 2018-12-16 ENCOUNTER — Emergency Department (HOSPITAL_COMMUNITY): Payer: Medicare Other

## 2018-12-16 ENCOUNTER — Encounter (HOSPITAL_COMMUNITY)
Admission: EM | Disposition: A | Discharge status: Home / Self Care | Payer: Self-pay | Source: Home / Self Care | Attending: Neurology

## 2018-12-16 ENCOUNTER — Encounter (HOSPITAL_COMMUNITY): Payer: Self-pay | Admitting: Certified Registered Nurse Anesthetist

## 2018-12-16 ENCOUNTER — Ambulatory Visit: Admit: 2018-12-16 | Payer: Medicare Other

## 2018-12-16 DIAGNOSIS — I4819 Other persistent atrial fibrillation: Secondary | ICD-10-CM | POA: Diagnosis present

## 2018-12-16 DIAGNOSIS — E66811 Obesity, class 1: Secondary | ICD-10-CM | POA: Diagnosis present

## 2018-12-16 DIAGNOSIS — I63412 Cerebral infarction due to embolism of left middle cerebral artery: Secondary | ICD-10-CM | POA: Diagnosis not present

## 2018-12-16 DIAGNOSIS — R4701 Aphasia: Secondary | ICD-10-CM | POA: Diagnosis present

## 2018-12-16 DIAGNOSIS — Z882 Allergy status to sulfonamides status: Secondary | ICD-10-CM

## 2018-12-16 DIAGNOSIS — Z961 Presence of intraocular lens: Secondary | ICD-10-CM | POA: Diagnosis present

## 2018-12-16 DIAGNOSIS — H9190 Unspecified hearing loss, unspecified ear: Secondary | ICD-10-CM | POA: Diagnosis present

## 2018-12-16 DIAGNOSIS — I6300 Cerebral infarction due to thrombosis of unspecified precerebral artery: Secondary | ICD-10-CM | POA: Diagnosis not present

## 2018-12-16 DIAGNOSIS — Z7982 Long term (current) use of aspirin: Secondary | ICD-10-CM

## 2018-12-16 DIAGNOSIS — Z9842 Cataract extraction status, left eye: Secondary | ICD-10-CM | POA: Diagnosis not present

## 2018-12-16 DIAGNOSIS — R29702 NIHSS score 2: Secondary | ICD-10-CM | POA: Diagnosis present

## 2018-12-16 DIAGNOSIS — Z79899 Other long term (current) drug therapy: Secondary | ICD-10-CM | POA: Diagnosis not present

## 2018-12-16 DIAGNOSIS — E785 Hyperlipidemia, unspecified: Secondary | ICD-10-CM | POA: Diagnosis present

## 2018-12-16 DIAGNOSIS — R011 Cardiac murmur, unspecified: Secondary | ICD-10-CM | POA: Diagnosis present

## 2018-12-16 DIAGNOSIS — I6340 Cerebral infarction due to embolism of unspecified cerebral artery: Secondary | ICD-10-CM

## 2018-12-16 DIAGNOSIS — Z713 Dietary counseling and surveillance: Secondary | ICD-10-CM | POA: Diagnosis not present

## 2018-12-16 DIAGNOSIS — Z9282 Status post administration of tPA (rtPA) in a different facility within the last 24 hours prior to admission to current facility: Secondary | ICD-10-CM | POA: Diagnosis not present

## 2018-12-16 DIAGNOSIS — I63512 Cerebral infarction due to unspecified occlusion or stenosis of left middle cerebral artery: Secondary | ICD-10-CM

## 2018-12-16 DIAGNOSIS — K219 Gastro-esophageal reflux disease without esophagitis: Secondary | ICD-10-CM | POA: Diagnosis present

## 2018-12-16 DIAGNOSIS — I10 Essential (primary) hypertension: Secondary | ICD-10-CM | POA: Insufficient documentation

## 2018-12-16 DIAGNOSIS — E669 Obesity, unspecified: Secondary | ICD-10-CM | POA: Diagnosis present

## 2018-12-16 DIAGNOSIS — Z6833 Body mass index (BMI) 33.0-33.9, adult: Secondary | ICD-10-CM

## 2018-12-16 DIAGNOSIS — I2510 Atherosclerotic heart disease of native coronary artery without angina pectoris: Secondary | ICD-10-CM | POA: Diagnosis present

## 2018-12-16 DIAGNOSIS — I4891 Unspecified atrial fibrillation: Secondary | ICD-10-CM | POA: Diagnosis present

## 2018-12-16 DIAGNOSIS — Z9841 Cataract extraction status, right eye: Secondary | ICD-10-CM | POA: Diagnosis not present

## 2018-12-16 DIAGNOSIS — R7303 Prediabetes: Secondary | ICD-10-CM | POA: Diagnosis present

## 2018-12-16 DIAGNOSIS — I639 Cerebral infarction, unspecified: Secondary | ICD-10-CM | POA: Insufficient documentation

## 2018-12-16 DIAGNOSIS — I361 Nonrheumatic tricuspid (valve) insufficiency: Secondary | ICD-10-CM | POA: Diagnosis not present

## 2018-12-16 HISTORY — DX: Cerebral infarction due to unspecified occlusion or stenosis of left middle cerebral artery: I63.512

## 2018-12-16 LAB — CBC
HCT: 43.3 % (ref 36.0–46.0)
Hemoglobin: 14.2 g/dL (ref 12.0–15.0)
MCH: 32 pg (ref 26.0–34.0)
MCHC: 32.8 g/dL (ref 30.0–36.0)
MCV: 97.5 fL (ref 80.0–100.0)
Platelets: 215 10*3/uL (ref 150–400)
RBC: 4.44 MIL/uL (ref 3.87–5.11)
RDW: 13.1 % (ref 11.5–15.5)
WBC: 9.3 10*3/uL (ref 4.0–10.5)
nRBC: 0 % (ref 0.0–0.2)

## 2018-12-16 LAB — COMPREHENSIVE METABOLIC PANEL
ALK PHOS: 49 U/L (ref 38–126)
ALT: 22 U/L (ref 0–44)
AST: 26 U/L (ref 15–41)
Albumin: 3.9 g/dL (ref 3.5–5.0)
Anion gap: 8 (ref 5–15)
BUN: 19 mg/dL (ref 8–23)
CALCIUM: 9.3 mg/dL (ref 8.9–10.3)
CO2: 27 mmol/L (ref 22–32)
Chloride: 103 mmol/L (ref 98–111)
Creatinine, Ser: 1.06 mg/dL — ABNORMAL HIGH (ref 0.44–1.00)
GFR calc Af Amer: 59 mL/min — ABNORMAL LOW (ref >60)
GFR calc non Af Amer: 51 mL/min — ABNORMAL LOW (ref >60)
Glucose, Bld: 104 mg/dL — ABNORMAL HIGH (ref 70–99)
Potassium: 4 mmol/L (ref 3.5–5.1)
Sodium: 138 mmol/L (ref 135–145)
TOTAL PROTEIN: 7.3 g/dL (ref 6.5–8.1)
Total Bilirubin: 1.3 mg/dL — ABNORMAL HIGH (ref 0.3–1.2)

## 2018-12-16 LAB — DIFFERENTIAL
Abs Immature Granulocytes: 0.03 10*3/uL (ref 0.00–0.07)
Basophils Absolute: 0 10*3/uL (ref 0.0–0.1)
Basophils Relative: 0 %
Eosinophils Absolute: 0.1 10*3/uL (ref 0.0–0.5)
Eosinophils Relative: 1 %
Immature Granulocytes: 0 %
Lymphocytes Relative: 12 %
Lymphs Abs: 1.1 10*3/uL (ref 0.7–4.0)
MONOS PCT: 11 %
Monocytes Absolute: 1.1 10*3/uL — ABNORMAL HIGH (ref 0.1–1.0)
Neutro Abs: 7 10*3/uL (ref 1.7–7.7)
Neutrophils Relative %: 76 %

## 2018-12-16 LAB — PROTIME-INR
INR: 1.06
Prothrombin Time: 13.7 seconds (ref 11.4–15.2)

## 2018-12-16 LAB — APTT: aPTT: 27 seconds (ref 24–36)

## 2018-12-16 LAB — TROPONIN I: Troponin I: <0.03 ng/mL (ref <0.03)

## 2018-12-16 SURGERY — IR WITH ANESTHESIA
Anesthesia: Choice

## 2018-12-16 MED ORDER — HEPARIN (PORCINE) 25000 UT/250ML-% IV SOLN: 800.0000 [IU]/h | INTRAVENOUS | Status: DC

## 2018-12-16 MED ORDER — IOHEXOL 350 MG/ML SOLN
75.0000 mL | Freq: Once | INTRAVENOUS | Status: AC | PRN
  Administered 2018-12-16: 75 mL via INTRAVENOUS

## 2018-12-16 MED ORDER — HEPARIN BOLUS VIA INFUSION
1600.0000 [IU] | Freq: Once | INTRAVENOUS | Status: DC
  Filled 2018-12-16: qty 1600

## 2018-12-16 MED ORDER — ALTEPLASE (STROKE) FULL DOSE INFUSION
0.9000 mg/kg | Freq: Once | INTRAVENOUS | Status: DC
  Administered 2018-12-16: 71.5 mg via INTRAVENOUS

## 2018-12-16 MED ORDER — SODIUM CHLORIDE 0.9 % IV SOLN: 50.0000 mL | Freq: Once | INTRAVENOUS | Status: DC

## 2018-12-16 MED ORDER — ASPIRIN 81 MG PO CHEW
324.0000 mg | CHEWABLE_TABLET | Freq: Once | ORAL | Status: AC
  Administered 2018-12-16: 324 mg via ORAL
  Filled 2018-12-16: qty 4

## 2018-12-16 MED ORDER — ALTEPLASE 100 MG IV SOLR
INTRAVENOUS | Status: AC
  Filled 2018-12-16: qty 100

## 2018-12-16 MED ORDER — STROKE: EARLY STAGES OF RECOVERY BOOK
Freq: Once | Status: AC
  Administered 2018-12-17: 1
  Filled 2018-12-16: qty 1

## 2018-12-16 MED ORDER — CLEVIDIPINE BUTYRATE 0.5 MG/ML IV EMUL: 0.0000 mg/h | INTRAVENOUS | Status: DC

## 2018-12-16 MED ORDER — IOPAMIDOL (ISOVUE-370) INJECTION 76%
100.0000 mL | Freq: Once | INTRAVENOUS | Status: AC | PRN
  Administered 2018-12-16: 100 mL via INTRAVENOUS

## 2018-12-16 MED ORDER — LABETALOL HCL 5 MG/ML IV SOLN
INTRAVENOUS | Status: AC
  Filled 2018-12-16: qty 4

## 2018-12-16 MED ORDER — SODIUM CHLORIDE 0.9 % IV SOLN
INTRAVENOUS | Status: DC
  Administered 2018-12-16 – 2018-12-17 (×2): via INTRAVENOUS

## 2018-12-16 MED ORDER — PANTOPRAZOLE SODIUM 40 MG IV SOLR
40.0000 mg | Freq: Every day | INTRAVENOUS | Status: DC
  Administered 2018-12-16 – 2018-12-17 (×2): 40 mg via INTRAVENOUS
  Filled 2018-12-16 (×2): qty 40

## 2018-12-16 MED ORDER — SENNOSIDES-DOCUSATE SODIUM 8.6-50 MG PO TABS: 1.0000 | ORAL_TABLET | Freq: Every evening | ORAL | Status: DC | PRN

## 2018-12-16 NOTE — Code Documentation (Signed)
77 yo female coming from [facility] where she went to be evaluated for a loss of speech approximately 30 minutes. Pt reports she was on the phone at 0930 when she had a sudden onset of loss of speech. Pt went to [facility] and resolved per EDP. CT Head showed no hemorrhage and CTA showed M2 occlusion. NIHSS 0 at this time per EDP. Pt was at her normal until 1355. 1358 RN noted that patient "could not speak" and aphasia was noted. EDP called Neurology and advised to give tPA. tPA started at 1500 at [facility]. IR activated. Pt brought from [facility] to [facility] by [facility]. During transport, pt began to get better. Stroke Team met patient upon arrival to the ED. Initial NIHSS 1 due to very slight aphasia. CTA/CTP completed. Pt is not a candidate for IR at this time due to NIHSS 1. CTA showed interval resolution of left M2 occlusion. CTP shows a 9 cc penumbra in the left superolateral frontal lobe with no core. Pt brought back to ED room and placed on cardiac monitor. Noted to be in atrial fibrillation 105-118. BP stable and within parameters. Patient to be admitted to [facility]. Stroke workup to be completed. Handoff given to Michelle, RN 

## 2018-12-16 NOTE — ED Notes (Addendum)
Clydie Braun, RN charge nurse given report

## 2018-12-16 NOTE — Progress Notes (Signed)
CODE STROKE- PHARMACY COMMUNICATION   Time CODE STROKE called/page received: 1108  Time response to CODE STROKE was made (in person or via phone): 1115  Time Stroke Kit retrieved from [place] (only if needed): N/A  Name of Provider/Nurse contacted:  Past Medical History:  Diagnosis Date  . Depression   . Esophageal spasm   . Esophageal spasm   . GERD (gastroesophageal reflux disease)   . Heart murmur   . HOH (hard of hearing)   . Hypertension   . Pre-diabetes    Prior to Admission medications   Medication Sig Start Date End Date Taking? Authorizing Provider  albuterol (PROVENTIL HFA;VENTOLIN HFA) 108 (90 Base) MCG/ACT inhaler Inhale 2 puffs into the lungs every 4 (four) hours as needed.    [provider]  amLODipine (NORVASC) 5 MG tablet Take 5 mg by mouth daily.    [provider]  aspirin 81 MG tablet Take 81 mg by mouth daily.    [provider]  citalopram (CELEXA) 20 MG tablet Take 20 mg by mouth daily.    [provider]  hydrochlorothiazide (HYDRODIURIL) 25 MG tablet Take 25 mg by mouth daily. 09/17/18   [provider]  losartan (COZAAR) 100 MG tablet Take 100 mg by mouth daily. 09/17/18   [provider]  mometasone (NASONEX) 50 MCG/ACT nasal spray Place 2 sprays into the nose daily.    [provider]  Multiple Vitamin (MULTIVITAMIN) tablet Take 1 tablet by mouth daily.    [provider]  omeprazole (PRILOSEC) 20 MG capsule Take 20 mg by mouth daily.    [provider]  tolterodine (DETROL LA) 4 MG 24 hr capsule Take 4 mg by mouth daily.    [provider]  losartan-hydrochlorothiazide (HYZAAR) 100-25 MG tablet Take 1 tablet by mouth daily.  12/16/18  [provider]    Charlett Nose ,PharmD Clinical Pharmacist  12/16/2018  11:35 AM

## 2018-12-16 NOTE — ED Notes (Signed)
Nurse assisted pt to the restroom. Pt in NAD and ambulatory with no assist. Pt stating some lightheadedness while sitting in the bed and with ambulation.

## 2018-12-16 NOTE — ED Notes (Signed)
Spoke with Neurologist Willeen Niece concerning patients inability to urinate even with the pressure and pain.  Per MD can use in and out due to TPA being completed over 4 hours previous.  Upon reassessment of patient, patient was able to use purwick catheter to urinate.  200 cc in suction canister.  Patient states pain has diminished after urination.

## 2018-12-16 NOTE — H&P (Addendum)
Neurology history and physical    CC: Aphasia  History is obtained from: EMS and patient  HPI: Emma Rivera is a 78 y.o. female with history of prediabetes, hypertension, heart murmur, esophageal spasms and hard of hearing.  Patient presented [facility].  At that time her main issue was that she was on the phone this morning at approximately 9:30 AM and had been witnessed per her family that she was not able to get her words out.  She denied any focal motor weakness or slurred speech.  While in the ED all her symptoms had resolved and NIH stroke scale was 0.  She obtained a CT of the brain which showed no acute changes.  However at 1358 her symptoms again were present and then disappeared at 1430.  Patient was administered tPA at 1500 hrs. and [facility].  [facility] she did have a CT angiogram of head and neck.  The reading showed a emergent left M2 occlusion/embolism with partial reconstitution of downstream vessels.  It also showed mild atherosclerosis without flow-limiting stenosis or embolic source seen in the neck.  While arriving [facility] patient was noted to have mild aphasia and trouble following some commands otherwise normal exam.  Patient was immediately brought to CT to obtain a follow-up CTA of head and neck along with a CT perfusion.   Repeat CTA:  1. Interval resolution of left M2 occlusion. No new intracranial large vessel occlusion. 2. CT perfusion demonstrates no core infarct by standard perfusion criteria. 3. CT perfusion demonstrates a 9 cc focus of oligemia in the left superolateral frontal lobe which may reflect sequelae of recent ischemia from M2 occlusion or possibly a persistent small branch occlusion. 4. Patent carotid and vertebral arteries. No dissection, aneurysm, or hemodynamically significant stenosis utilizing NASCET criteria.   LKW: 1358 on 12/16/2018 tpa given?:  Yes and administered at 1500  hrs. Premorbid modified Rankin scale (mRS): 0 NIH stroke scale: 2   ROS: A 14 point ROS was performed and is negative except as noted in the HPI.   Past Medical History:  Diagnosis Date  . Depression   . Esophageal spasm   . Esophageal spasm   . GERD (gastroesophageal reflux disease)   . Heart murmur   . HOH (hard of hearing)   . Hypertension   . Pre-diabetes     Family History  Problem Relation Age of Onset  . Breast cancer Maternal Aunt     Social History:   reports that she has never smoked. She has never used smokeless tobacco. She reports current alcohol use. She reports that she does not use drugs.  Medications  Current Facility-Administered Medications:  .  iopamidol (ISOVUE-370) 76 % injection 100 mL, 100 mL, Intravenous, Once PRN, Long, Arlyss RepressJoshua G, MD  Current Outpatient Medications:  .  albuterol (PROVENTIL HFA;VENTOLIN HFA) 108 (90 Base) MCG/ACT inhaler, Inhale 2 puffs into the lungs every 4 (four) hours as needed., Disp: , Rfl:  .  amLODipine (NORVASC) 5 MG tablet, Take 5 mg by mouth daily., Disp: , Rfl:  .  aspirin 81 MG tablet, Take 81 mg by mouth daily., Disp: , Rfl:  .  citalopram (CELEXA) 20 MG tablet, Take 20 mg by mouth daily., Disp: , Rfl:  .  hydrochlorothiazide (HYDRODIURIL) 25 MG tablet, Take 25 mg by mouth daily., Disp: , Rfl:  .  losartan (COZAAR) 100 MG tablet, Take 100 mg by mouth daily., Disp: , Rfl:  .  mometasone (NASONEX) 50 MCG/ACT nasal spray, Place 2 sprays into the nose daily., Disp: , Rfl:  .  Multiple Vitamin (MULTIVITAMIN) tablet, Take 1 tablet by mouth daily., Disp: , Rfl:  .  omeprazole (PRILOSEC) 20 MG capsule, Take 20 mg by mouth daily., Disp: , Rfl:  .  tolterodine (DETROL LA) 4 MG 24 hr capsule, Take 4 mg by mouth daily., Disp: , Rfl:    Exam: Current vital signs: There were no vitals taken for this visit. Vital signs in last 24 hours: Temp:  [97.9 F (36.6 C)] 97.9 F (36.6 C) (02/03 1050) Pulse Rate:  [41-145] 105 (02/03  1504) Resp:  [11-21] 20 (02/03 1504) BP: (106-131)/(60-95) 130/93 (02/03 1504) SpO2:  [91 %-98 %] 98 % (02/03 1504) Weight:  [79.4 kg] 79.4 kg (02/03 1050)  Physical Exam  Constitutional: Appears well-developed and well-nourished.  Psych: Affect appropriate to situation Eyes: No scleral injection HENT: No OP obstrucion Head: Normocephalic.  Cardiovascular: Normal rate and regular rhythm.  Respiratory: Effort normal, non-labored breathing GI: Soft.  No distension. There is no tenderness.  Skin: WDI  Neuro: Mental Status: Patient is awake, alert, oriented to person, place, month, year, however she did have mild difficulty naming objects (badge, index finger and ring finger). She also has slight difficulty with 3 step commands as she was able to touch her right ear and point to the ceiling but when asked to do with her thumb she uses her index finger. Patient is able to give a clear and coherent history. No dysarthria.  Very mild expressive aphasia as noted above Cranial Nerves: II: Visual Fields are full. III,IV, VI: EOMI without ptosis or diploplia.  Pupils are equal, round, and reactive to light.   V: Facial sensation is symmetric to temperature VII: Slight right facial droop.  VIII: hearing is intact to voice X: Uvula elevates symmetrically XI: Shoulder shrug is symmetric. XII: tongue is midline without atrophy or fasciculations.  Motor: Tone is normal. Bulk is normal. 5/5 strength was present in all four extremities.  No pronator drift.  Sensory: Sensation is symmetric to light touch and temperature in the arms and legs. Deep Tendon Reflexes: 2+ and symmetric in the biceps and patellae.  Plantars: Toes are downgoing bilaterally.  Cerebellar: FNF and HKS are intact bilaterally  Labs I have reviewed labs in epic and the results pertinent to this consultation are:  CBC    Component Value Date/Time   WBC 9.3 12/16/2018 1058   RBC 4.44 12/16/2018 1058   HGB 14.2  12/16/2018 1058   HCT 43.3 12/16/2018 1058   PLT 215 12/16/2018 1058   MCV 97.5 12/16/2018 1058   MCH 32.0 12/16/2018 1058   MCHC 32.8 12/16/2018 1058   RDW 13.1 12/16/2018 1058   LYMPHSABS 1.1 12/16/2018 1058   MONOABS 1.1 (H) 12/16/2018 1058   EOSABS 0.1 12/16/2018 1058   BASOSABS 0.0 12/16/2018 1058    CMP     Component Value Date/Time   NA 138 12/16/2018 1058   K 4.0 12/16/2018 1058   CL 103 12/16/2018 1058   CO2 27 12/16/2018 1058   GLUCOSE 104 (H) 12/16/2018 1058   BUN 19 12/16/2018 1058   CREATININE 1.06 (H) 12/16/2018 1058   CALCIUM 9.3 12/16/2018 1058   PROT 7.3 12/16/2018 1058   ALBUMIN 3.9 12/16/2018 1058   AST 26 12/16/2018 1058   ALT 22 12/16/2018 1058   ALKPHOS 49 12/16/2018 1058   BILITOT 1.3 (H) 12/16/2018 1058   GFRNONAA 51 (L)  12/16/2018 1058   GFRAA 59 (L) 12/16/2018 1058    Lipid Panel  No results found for: CHOL, TRIG, HDL, CHOLHDL, VLDL, LDLCALC, LDLDIRECT   Imaging I have reviewed the images obtained:  CT-scan of the brain- negative for hemorrhage or visible acute infarct  Initial CTA of head and neck-  emergent left M2 occlusion/embolism with partial reconstruction of downstream vessels.  It also showed mild atherosclerosis without flow-limiting stenosis or embolic source seen in the neck.  Follow up CTA of head and neck along with perfusion: 1. Interval resolution of left M2 occlusion. No new intracranial large vessel occlusion. 2. CT perfusion demonstrates no core infarct by standard perfusion criteria. 3. CT perfusion demonstrates a 9 cc focus of oligemia in the left superolateral frontal lobe which may reflect sequelae of recent ischemia from M2 occlusion or possibly a persistent small branch occlusion. 4. Patent carotid and vertebral arteries. No dissection, aneurysm, or hemodynamically significant stenosis utilizing NASCET criteria.    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  At this point she has a NIH stroke scale of 2 based on subtle language deficit. 1. Left M2 occlusion resolved after tPA on repeat CTA 2. Perfusion deficit and neurological deficit on exam too mild to justify endovascular treatment from a risk/benefit standpoint 3. Classifiable as having failed ASA monotherapy   Recommendations: # MRI of the brain without contrast #Transthoracic Echo,  # Start patient on Plavix 75 mg daily if repeat CT head at 24 hours is negative for hemorrhagic conversion # Start or continue Atorvastatin 40 mg po qd # BP goal: Post-tPA guideline of BP up to 180/105; treat if BP is above this threshold # HBAIC and Lipid profile # Telemetry monitoring # Frequent neuro checks # NPO until passes stroke swallow screen # PT/OT/ST therapies and recommendations when able  CNS -Close neuro monitoring  -PM&R consult  HEME -transfuse for hgb < 7  Prophylaxis DVT: SCD  Diet: NPO until cleared by speech  Code Status: Full Code    50 minutes spent in the emergent neurological evaluation and management of this acute stroke patient.    I have interviewed and examined the patient. I have performed the neurological exam, which was documented by Felicie Morn, PA Electronically signed: Dr. Caryl Pina             # please page stroke NP  Or  PA  Or MD from 8am -4 pm  as this patient from this time will be  followed by the stroke.   You can look them up on [email or web address]  Password [email or web address]

## 2018-12-16 NOTE — Progress Notes (Signed)
   12/16/18 1100  Clinical Encounter Type  Visited With Family  Visit Type Code  Stress Factors  Family Stress Factors Lack of knowledge   Chaplain visited with the husband and neighbor/best-friend of the patient, who was receiving a CT at the time. Husband awaiting news from head CT. No needs at this time. Will return.

## 2018-12-16 NOTE — Consult Note (Addendum)
Referring Physician: Dr. Scotty CourtStafford ( ED)     Reason for Consult: Acute aphasia, R/O stroke   HPI: Emma Rivera is an 78 y.o. female with PMH significant for hearing loss, HTN presented to the hospital accompanied by her family due to a sudden onset of expressive aphasia started this morning while she was on the phone at 930 AM and witnessed by her family, pt reported that she was not able to get her words out, she denied any focal motor weakness, no slurred speech, no LOC, no vision changes, no numbness, by the time she was evaluated in the ED her symptoms has resolved, her NIHss was 0, she had CT brain which showed no acute changes, she stated that she takes daily ASA 81 mg but no blood thinner, pt denied any SOB, no N/V, no Dizziness, no chest pain, no fevers. In ED pt was in afib with RVR, HR>130  Date last known well: 12/16/2018 Time last known well: 930 AM  tPA Given: no, symptoms resolved with NIHss 0  Past Medical History Past Medical History:  Diagnosis Date  . Depression   . Esophageal spasm   . Esophageal spasm   . GERD (gastroesophageal reflux disease)   . Heart murmur   . HOH (hard of hearing)   . Hypertension   . Pre-diabetes     Surgical History Past Surgical History:  Procedure Laterality Date  . CARDIAC CATHETERIZATION    . CATARACT EXTRACTION W/PHACO Left 09/09/2015   Procedure: CATARACT EXTRACTION PHACO AND INTRAOCULAR LENS PLACEMENT (IOC);  Surgeon: Lia HoppingNisha Mukherjee, MD;  Location: [facility] ORS;  Service: Ophthalmology;  Laterality: Left;  US   1:10.5 AP     8.6 CDE  6.05 casette lot #  [id number]  . CATARACT EXTRACTION W/PHACO Right 09/30/2015   Procedure: CATARACT EXTRACTION PHACO AND INTRAOCULAR LENS PLACEMENT (IOC);  Surgeon: Lia HoppingNisha Mukherjee, MD;  Location: [facility] ORS;  Service: Ophthalmology;  Laterality: Right;  US 01:00.3 AP%: 11.6 CDE: 7.00 FLUID LOT# [id number]  . COLONOSCOPY    . COLONOSCOPY WITH PROPOFOL N/A 04/04/2017   Procedure: COLONOSCOPY WITH PROPOFOL;   Surgeon: Scot JunElliott, Robert T, MD;  [facility] END[facility];  Service: Endoscopy;  Laterality: N/A;  . TONSILLECTOMY      Family History  Family History  Problem Relation Age of Onset  . Breast cancer Maternal Aunt     Social History:   reports that she has never smoked. She has never used smokeless tobacco. She reports current alcohol use. She reports that she does not use drugs.  Allergies:  Allergies  Allergen Reactions  . Sulfa Antibiotics Nausea Only    Home Medications:  (Not in a hospital admission)   Hospital Medications . aspirin  324 mg Oral Once    ROS: Negative except of what is reported in HPI     Physical Examination:  Vitals:   12/16/18 1050  BP: 123/90  Pulse: (!) 145  Resp: 19  Temp: 97.9 F (36.6 C)  TempSrc: Oral  SpO2: 91%  Weight: 79.4 kg  Height: 5\' 2"  (1.575 m)    General -  Heart - Irregular rate and afib rhythm - no murmer appreciated Lungs - Clear to auscultation  Abdomen - Soft - non tender Extremities - Distal pulses intact - no edema Skin - Warm and dry   Neurologic Examination:  Mental Status:   NIHSS 0 1a Level of Conscious:0 1b LOC Questions: 0 1c LOC Commands: 0 2 Best Gaze: 0 3 Visual: 0 4 Facial  Palsy: 0 5a Motor Arm - left: 0 5b Motor Arm - Right: 0 6a Motor Leg - Left: 0 6b Motor Leg - Right: 0 7 Limb Ataxia: 0 8 Sensory: 0 9 Best Language: 0 10 Dysarthria:0 11 Extinct. and Inattention:0 TOTAL: 0   LABORATORY STUDIES:  Basic Metabolic Panel: No results for input(s): NA, K, CL, CO2, GLUCOSE, BUN, CREATININE, CALCIUM, MG, PHOS in the last 168 hours.  Liver Function Tests: No results for input(s): AST, ALT, ALKPHOS, BILITOT, PROT, ALBUMIN in the last 168 hours. No results for input(s): LIPASE, AMYLASE in the last 168 hours. No results for input(s): AMMONIA in the last 168 hours.  CBC: Recent Labs  Lab 12/16/18 1058  WBC 9.3  NEUTROABS 7.0  HGB 14.2  HCT 43.3  MCV 97.5  PLT 215    Cardiac  Enzymes: No results for input(s): CKTOTAL, CKMB, CKMBINDEX, TROPONINI in the last 168 hours.  BNP: Invalid input(s): POCBNP  CBG: No results for input(s): GLUCAP in the last 168 hours.  Microbiology:   Coagulation Studies: No results for input(s): LABPROT, INR in the last 72 hours.  Urinalysis: No results for input(s): COLORURINE, LABSPEC, PHURINE, GLUCOSEU, HGBUR, BILIRUBINUR, KETONESUR, PROTEINUR, UROBILINOGEN, NITRITE, LEUKOCYTESUR in the last 168 hours.  Invalid input(s): APPERANCEUR  Lipid Panel:  No results found for: CHOL, TRIG, HDL, CHOLHDL, VLDL, LDLCALC  HgbA1C:  No results found for: HGBA1C  Urine Drug Screen:  No results found for: LABOPIA, COCAINSCRNUR, LABBENZ, AMPHETMU, THCU, LABBARB   Alcohol Level:  No results for input(s): ETH in the last 168 hours.  Miscellaneous labs:  EKG  EKG   IMAGING: Ct Head Code Stroke Wo Contrast  Result Date: 12/16/2018 CLINICAL DATA:  Code stroke. Speech difficulty beginning at 9:30 a.m. EXAM: CT HEAD WITHOUT CONTRAST TECHNIQUE: Contiguous axial images were obtained from the base of the skull through the vertex without intravenous contrast. COMPARISON:  None. FINDINGS: Brain: No evidence of acute infarction, hemorrhage, hydrocephalus, extra-axial collection or mass lesion/mass effect. Mild for age cerebral volume loss and chronic small-vessel ischemia in the cerebral white matter. Vascular: No hyperdense vessel or unexpected calcification. Skull: Normal. Negative for fracture or focal lesion. Sinuses/Orbits: Bilateral cataract resection Other: These results were called by telephone at the time of interpretation on 12/16/2018 at 11:14 am to Dr. Sharman Cheek , who verbally acknowledged these results. ASPECTS [facility] Stroke Program Early CT Score) - Ganglionic level infarction (caudate, lentiform nuclei, internal capsule, insula, M1-M3 cortex): 7 - Supraganglionic infarction (M4-M6 cortex): 3 Total score (0-10 with 10 being normal):  10 IMPRESSION: Negative for hemorrhage or visible acute infarct. Electronically Signed   By: Marnee Spring M.D.   On: 12/16/2018 11:15      Assessment: 78 y.o. female        Plan:    No IVTPA due to NIHss of 0, symptoms resloved  Stat CTA head and neck to R/O LVO  Continue daily asa 81 mg  Pt is in Afib, start AC ( Eliquis)  MRI brain WO contrast  Stroke work up: A1C, ECHO, Lipide profile, PT/OT and speech  Lipitor 80 mg po QHS LDL goal < 70  Risk factor modification  Telemetry monitoring  Frequent neuro checks  Fall Precautions  DVT prophelaxis  NPO until swallowing evaluation has been passed (aspirin suppository if needed)  Plan discussed in setails with the patient and her family at bedside, plan also has been communicated with the ED staff.   Jonna Munro, MD    Addendum:  CTA  showed L M2 occlusion, pt was initially intact, she was in the process of being transferred to [facility] for neuroendovascular evaluation and possible thrombectomy, heparin gtt was orderd, before heparin gtt was started pt had another transient episode of aphasia, IVTPA was started this time since pt has bneen fluctuating, IVTPA started at 1500, pt exam was normal before she was transferred to [place] at 1510.  Jonna Munro, MD

## 2018-12-16 NOTE — ED Provider Notes (Signed)
[facility] Emergency Department Provider Note  ____________________________________________  Time seen: Approximately 2:06 PM  I have reviewed the triage vital signs and the nursing notes.   HISTORY  Chief Complaint Aphasia    HPI Emma Rivera is a 78 y.o. female with a history of hypertension GERD and prediabetes who comes to the ED for evaluation of difficulty speaking.  She was in her usual state of health, talking to a friend on the phone this morning at about 9:30 AM she had a sudden onset of inability to speak.  She found her husband in the kitchen and was ambulatory and seemed to have full strength but was unable to speak at that time.  The husband called EMS and by the time they arrived the patient was starting to feel better.  She reports limited recollection of events in the kitchen or the phone call.  No trauma recently.  No thunderclap headache.  On arrival to the hospital patient states that she feels back to normal.  She still does not have full recollection of the events when she could not talk on the phone or in the kitchen.  Denies any weakness or altered sensation in the body, no vision changes.  No headache.      Past Medical History:  Diagnosis Date  . Depression   . Esophageal spasm   . Esophageal spasm   . GERD (gastroesophageal reflux disease)   . Heart murmur   . HOH (hard of hearing)   . Hypertension   . Pre-diabetes      There are no active problems to display for this patient.    Past Surgical History:  Procedure Laterality Date  . CARDIAC CATHETERIZATION    . CATARACT EXTRACTION W/PHACO Left 09/09/2015   Procedure: CATARACT EXTRACTION PHACO AND INTRAOCULAR LENS PLACEMENT (IOC);  Surgeon: Lia HoppingNisha Mukherjee, MD;  Location: [facility] ORS;  Service: Ophthalmology;  Laterality: Left;  US   1:10.5 AP     8.6 CDE  6.05 casette lot #  [id number]  . CATARACT EXTRACTION W/PHACO Right 09/30/2015   Procedure: CATARACT EXTRACTION PHACO AND  INTRAOCULAR LENS PLACEMENT (IOC);  Surgeon: Lia HoppingNisha Mukherjee, MD;  Location: [facility] ORS;  Service: Ophthalmology;  Laterality: Right;  US 01:00.3 AP%: 11.6 CDE: 7.00 FLUID LOT# [id number]  . COLONOSCOPY    . COLONOSCOPY WITH PROPOFOL N/A 04/04/2017   Procedure: COLONOSCOPY WITH PROPOFOL;  Surgeon: Scot JunElliott, Robert T, MD;  [facility];  Service: Endoscopy;  Laterality: N/A;  . TONSILLECTOMY       Prior to Admission medications   Medication Sig Start Date End Date Taking? Authorizing Provider  albuterol (PROVENTIL HFA;VENTOLIN HFA) 108 (90 Base) MCG/ACT inhaler Inhale 2 puffs into the lungs every 4 (four) hours as needed.   Yes [provider]  amLODipine (NORVASC) 5 MG tablet Take 5 mg by mouth daily.   Yes [provider]  aspirin 81 MG tablet Take 81 mg by mouth daily.   Yes [provider]  citalopram (CELEXA) 20 MG tablet Take 20 mg by mouth daily.   Yes [provider]  hydrochlorothiazide (HYDRODIURIL) 25 MG tablet Take 25 mg by mouth daily. 09/17/18  Yes [provider]  losartan (COZAAR) 100 MG tablet Take 100 mg by mouth daily. 09/17/18  Yes [provider]  mometasone (NASONEX) 50 MCG/ACT nasal spray Place 2 sprays into the nose daily.   Yes [provider]  Multiple Vitamin (MULTIVITAMIN) tablet Take 1 tablet by mouth daily.  Yes [provider]  omeprazole (PRILOSEC) 20 MG capsule Take 20 mg by mouth daily.   Yes [provider]  tolterodine (DETROL LA) 4 MG 24 hr capsule Take 4 mg by mouth daily.   Yes [provider]     Allergies Sulfa antibiotics   Family History  Problem Relation Age of Onset  . Breast cancer Maternal Aunt     Social History Social History   Tobacco Use  . Smoking status: Never Smoker  . Smokeless tobacco: Never Used  Substance Use Topics  . Alcohol use: Yes    Comment: occasional  . Drug use: No    Review of Systems  Constitutional:   No  fever or chills.  ENT:   No sore throat. No rhinorrhea. Cardiovascular:   No chest pain or syncope. Respiratory:   No dyspnea or cough. Gastrointestinal:   Negative for abdominal pain, vomiting and diarrhea.  Musculoskeletal:   Negative for focal pain or swelling All other systems reviewed and are negative except as documented above in ROS and HPI.  ____________________________________________   PHYSICAL EXAM:  VITAL SIGNS: ED Triage Vitals [12/16/18 1050]  Enc Vitals Group     BP 123/90     Pulse Rate (!) 145     Resp 19     Temp 97.9 F (36.6 C)     Temp Source Oral     SpO2 91 %     Weight 175 lb (79.4 kg)     Height 5\' 2"  (1.575 m)     Head Circumference      Peak Flow      Pain Score 0     Pain Loc      Pain Edu?      Excl. in GC?     Vital signs reviewed, nursing assessments reviewed.   Constitutional:   Alert and oriented. Non-toxic appearance. Eyes:   Conjunctivae are normal. EOMI. PERRL. ENT      Head:   Normocephalic and atraumatic.      Nose:   No congestion/rhinnorhea.       Mouth/Throat:   MMM, no pharyngeal erythema. No peritonsillar mass.       Neck:   No meningismus. Full ROM. Hematological/Lymphatic/Immunilogical:   No cervical lymphadenopathy. Cardiovascular:   RRR. Symmetric bilateral radial and DP pulses.  No murmurs. Cap refill less than 2 seconds. Respiratory:   Normal respiratory effort without tachypnea/retractions. Breath sounds are clear and equal bilaterally. No wheezes/rales/rhonchi. Gastrointestinal:   Soft and nontender. Non distended. There is no CVA tenderness.  No rebound, rigidity, or guarding. Musculoskeletal:   Normal range of motion in all extremities. No joint effusions.  No lower extremity tenderness.  No edema. Neurologic:   Normal speech and language.  Motor grossly intact. Sensation intact No pronator drift Normal finger-to-nose NIH stroke scale 0 No acute focal neurologic deficits are appreciated.  Skin:    Skin is  warm, dry and intact. No rash noted.  No petechiae, purpura, or bullae.  ____________________________________________    LABS (pertinent positives/negatives) (all labs ordered are listed, but only abnormal results are displayed) Labs Reviewed  DIFFERENTIAL - Abnormal; Notable for the following components:      Result Value   Monocytes Absolute 1.1 (*)    All other components within normal limits  COMPREHENSIVE METABOLIC PANEL - Abnormal; Notable for the following components:   Glucose, Bld 104 (*)    Creatinine, Ser 1.06 (*)    Total Bilirubin 1.3 (*)  GFR calc non Af Amer 51 (*)    GFR calc Af Amer 59 (*)    All other components within normal limits  CBC  TROPONIN I  PROTIME-INR  APTT  CBG MONITORING, ED   ____________________________________________   EKG  Interpreted by me Atrial fibrillation rate of 96, normal axis and intervals.  Poor R wave progression, normal ST segments and T waves.  ____________________________________________    RADIOLOGY  Ct Angio Head W Or Wo Contrast  Result Date: 12/16/2018 CLINICAL DATA:  Acute aphasia, now resolved EXAM: CT ANGIOGRAPHY HEAD AND NECK TECHNIQUE: Multidetector CT imaging of the head and neck was performed using the standard protocol during bolus administration of intravenous contrast. Multiplanar CT image reconstructions and MIPs were obtained to evaluate the vascular anatomy. Carotid stenosis measurements (when applicable) are obtained utilizing NASCET criteria, using the distal internal carotid diameter as the denominator. CONTRAST:  75mL OMNIPAQUE IOHEXOL 350 MG/ML SOLN COMPARISON:  Noncontrast head CT earlier today FINDINGS: CTA NECK FINDINGS Aortic arch: Unremarkable or covered Right carotid system: Vessels are smooth and widely patent. Probable mild atheromatous thickening of the common carotid wall. Left carotid system: Vessels are smooth and sufficiently patent. Low-density plaque at the ICA bulb. Atheromatous wall  thickening of the common carotid. Vertebral arteries: Proximal subclavian atheromatous wall thickening on the left. The vertebrals are widely patent. Skeleton: Cervical spine degeneration with C3-4 anterolisthesis. Other neck: Heterogeneous thyroid, more so on the right, without discrete nodule. Upper chest: Patchy air trapping. Review of the MIP images confirms the above findings CTA HEAD FINDINGS Anterior circulation: Left M2 branch cut off with downstream variable reconstitution of the 2 attenuated downstream vessels. There is history of new onset atrial fibrillation. No additional embolism is seen. Overall mild atheromatous changes, most notably a mild stenosis of the left ICA at the clinoid segment Posterior circulation: The vertebrobasilar arteries are smooth and widely patent. No branch occlusion or flow limiting stenosis Venous sinuses: Patent Anatomic variants: None significant Delayed phase: Not obtained These results were called by telephone at the time of interpretation on 12/16/2018 at 11:55 am to Dr. Sharman Cheek , who verbally acknowledged these results. Review of the MIP images confirms the above findings IMPRESSION: 1. Emergent left M2 occlusion/embolism with partial reconstitution of downstream vessels. 2. Mild atherosclerosis without flow limiting stenosis or embolic source seen in the neck. Electronically Signed   By: Marnee Spring M.D.   On: 12/16/2018 12:01   Ct Angio Neck W And/or Wo Contrast  Result Date: 12/16/2018 CLINICAL DATA:  Acute aphasia, now resolved EXAM: CT ANGIOGRAPHY HEAD AND NECK TECHNIQUE: Multidetector CT imaging of the head and neck was performed using the standard protocol during bolus administration of intravenous contrast. Multiplanar CT image reconstructions and MIPs were obtained to evaluate the vascular anatomy. Carotid stenosis measurements (when applicable) are obtained utilizing NASCET criteria, using the distal internal carotid diameter as the denominator.  CONTRAST:  75mL OMNIPAQUE IOHEXOL 350 MG/ML SOLN COMPARISON:  Noncontrast head CT earlier today FINDINGS: CTA NECK FINDINGS Aortic arch: Unremarkable or covered Right carotid system: Vessels are smooth and widely patent. Probable mild atheromatous thickening of the common carotid wall. Left carotid system: Vessels are smooth and sufficiently patent. Low-density plaque at the ICA bulb. Atheromatous wall thickening of the common carotid. Vertebral arteries: Proximal subclavian atheromatous wall thickening on the left. The vertebrals are widely patent. Skeleton: Cervical spine degeneration with C3-4 anterolisthesis. Other neck: Heterogeneous thyroid, more so on the right, without discrete nodule. Upper chest: Patchy air  trapping. Review of the MIP images confirms the above findings CTA HEAD FINDINGS Anterior circulation: Left M2 branch cut off with downstream variable reconstitution of the 2 attenuated downstream vessels. There is history of new onset atrial fibrillation. No additional embolism is seen. Overall mild atheromatous changes, most notably a mild stenosis of the left ICA at the clinoid segment Posterior circulation: The vertebrobasilar arteries are smooth and widely patent. No branch occlusion or flow limiting stenosis Venous sinuses: Patent Anatomic variants: None significant Delayed phase: Not obtained These results were called by telephone at the time of interpretation on 12/16/2018 at 11:55 am to Dr. Sharman Cheek , who verbally acknowledged these results. Review of the MIP images confirms the above findings IMPRESSION: 1. Emergent left M2 occlusion/embolism with partial reconstitution of downstream vessels. 2. Mild atherosclerosis without flow limiting stenosis or embolic source seen in the neck. Electronically Signed   By: Marnee Spring M.D.   On: 12/16/2018 12:01   Ct Head Code Stroke Wo Contrast  Result Date: 12/16/2018 CLINICAL DATA:  Code stroke. Speech difficulty beginning at 9:30 a.m. EXAM:  CT HEAD WITHOUT CONTRAST TECHNIQUE: Contiguous axial images were obtained from the base of the skull through the vertex without intravenous contrast. COMPARISON:  None. FINDINGS: Brain: No evidence of acute infarction, hemorrhage, hydrocephalus, extra-axial collection or mass lesion/mass effect. Mild for age cerebral volume loss and chronic small-vessel ischemia in the cerebral white matter. Vascular: No hyperdense vessel or unexpected calcification. Skull: Normal. Negative for fracture or focal lesion. Sinuses/Orbits: Bilateral cataract resection Other: These results were called by telephone at the time of interpretation on 12/16/2018 at 11:14 am to Dr. Sharman Cheek , who verbally acknowledged these results. ASPECTS [facility] Stroke Program Early CT Score) - Ganglionic level infarction (caudate, lentiform nuclei, internal capsule, insula, M1-M3 cortex): 7 - Supraganglionic infarction (M4-M6 cortex): 3 Total score (0-10 with 10 being normal): 10 IMPRESSION: Negative for hemorrhage or visible acute infarct. Electronically Signed   By: Marnee Spring M.D.   On: 12/16/2018 11:15    ____________________________________________   PROCEDURES .Critical Care Performed by: Sharman Cheek, MD Authorized by: Sharman Cheek, MD   Critical care provider statement:    Critical care time (minutes):  40   Critical care time was exclusive of:  Separately billable procedures and treating other patients   Critical care was necessary to treat or prevent imminent or life-threatening deterioration of the following conditions:  CNS failure or compromise   Critical care was time spent personally by me on the following activities:  Development of treatment plan with patient or surrogate, discussions with consultants, evaluation of patient's response to treatment, examination of patient, obtaining history from patient or surrogate, ordering and performing treatments and interventions, ordering and review of laboratory  studies, ordering and review of radiographic studies, pulse oximetry, re-evaluation of patient's condition and review of old charts    ____________________________________________  DIFFERENTIAL DIAGNOSIS   Intracranial hemorrhage, ischemic stroke/TIA  CLINICAL IMPRESSION / ASSESSMENT AND PLAN / ED COURSE  Pertinent labs & imaging results that were available during my care of the patient were reviewed by me and considered in my medical decision making (see chart for details).    Patient presents with sudden onset of aphasia earlier today, resolved on its own after about 30 minutes.  Currently asymptomatic.  Clinical Course as of Dec 16 1509  Mon Dec 16, 2018  1116 D/w radiology - CT negative.  Currently in room together with neuro. Will discuss recs after his exam.   [  PS]  1208 CT NGO discussed with radiology and neurology, finding of embolism in the left M2.  Recommendation from neurology is transfer for neuro interventional evaluation/thrombectomy at [facility].  [facility] contacted by Diplomatic Services operational officer.   [PS]  1220 D/w [facility] neuro, who will discuss with other team members regarding suitability for endovascular procedure.    [PS]  1313 Discussed with [facility]. neurology, recommends no immediate intervention but the patient should be transferred to [facility] hospitalist service for further monitoring of her symptoms.   [PS]  1403 Patient now with recurrent aphasia.  Started at about 1:55 PM.  Will recontact neurology regarding expedited intervention.   [PS]  1405 Discussed with [place] neurology Dr. Lavonna Monarch again, recommends expedited transfer to [facility] for endovascular procedure, would not TPA at this time.   [PS]  1448 Discussed with Dr. Otelia Limes at [facility], recommends starting TPA in anticipation of immediate transfer with [facility] for angio/endovasc. Intervention at [facility]. D/w Dr. Arma Heading again to confirm pt meets criteria for TPA   [PS]    Clinical Course User Index [PS] Sharman Cheek, MD      ----------------------------------------- 3:10 PM on 12/16/2018 -----------------------------------------  Patient consented for TPA and was started at about 3:00 PM.  [facility] was at bedside and is transporting patient to [facility] for further evaluation and management.  Symptoms appear to be improving again although not back to baseline.  Hemodynamically stable.  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 Dictation errors may occur despite best attempts at proofreading.   Sharman Cheek, MD 12/16/18 [phone]

## 2018-12-16 NOTE — ED Notes (Signed)
Patient transported to CT 

## 2018-12-16 NOTE — ED Notes (Signed)
Dr. Stafford at pt's bedside. 

## 2018-12-16 NOTE — Progress Notes (Signed)
ANTICOAGULATION CONSULT NOTE - Initial Consult  Pharmacy Consult for heparin Indication: atrial fibrillation and stroke  Allergies  Allergen Reactions  . Sulfa Antibiotics Nausea Only    Patient Measurements: Height: 5\' 2"  (157.5 cm) Weight: 175 lb (79.4 kg) IBW/kg (Calculated) : 50.1 Heparin Dosing Weight: 67.7 kg  Vital Signs: Temp: 97.9 F (36.6 C) (02/03 1050) Temp Source: Oral (02/03 1050) BP: 109/95 (02/03 1300) Pulse Rate: 41 (02/03 1300)  Labs: Recent Labs    12/16/18 1058 12/16/18 1135  HGB 14.2  --   HCT 43.3  --   PLT 215  --   APTT  --  27  LABPROT  --  13.7  INR  --  1.06  CREATININE 1.06*  --   TROPONINI <0.03  --     Estimated Creatinine Clearance: 44.1 mL/min (A) (by C-G formula based on SCr of 1.06 mg/dL (H)).   Medications:  No oral anticoagulant listed on PTA med list (med rec not yet done), Neuro note confirms this as well  Assessment: 78 yo female with acute stroke today, NIHSS 0, and AFib with RVR. Discussed pt with ED MD who stated embolic stroke caused by AFib likely, need to anticoagulate pt. Discussed with ED MD and settled on half bolus of heparin and starting heparin drip at lower end (more stroke dosing than AFib dosing).   Hgb 14.2, plt count 215, aPTT 27, INR 1.06   Goal of Therapy:  Heparin level 0.3-0.7 units/ml Monitor platelets by anticoagulation protocol: Yes   Plan:  Heparin bolus 1600 units IV x1 then heparin drip at 800 units/hr (=8 ml/hr) Heparin level 8h after start of heparin drip CBC in AM - continue to monitor for s/sx of bleeding  Pharmacy will continue to follow.   Crist FatWang, Pax Reasoner L 12/16/2018,1:40 PM

## 2018-12-16 NOTE — ED Notes (Signed)
EMTALA reviewed by charge RN 

## 2018-12-16 NOTE — ED Notes (Signed)
Nurse grabbed Dr. Scotty CourtStafford due to pt's family stating pt "can't speak." Pt has no ability to verbalize at this time.

## 2018-12-16 NOTE — ED Triage Notes (Signed)
Pt arrives [facility]. Sudden onset of expressive apashia while on the phone. NIH negative with EMS. VSS, CBG 118. HR irregular. No hx a fib. Hx of HTN.   Family member states this happened about 9:30. States was still able to say words but was stumbling over words. He states that pt came into kitchen after phone call and he asked her a question and pt didn't answer. States she was just looking off into space for a few minutes.   Pt is A&O, following commands. Denies pian. Denies weakness. Moving all extremities.   Family states pt was c/o of abd pain Saturday.

## 2018-12-16 NOTE — ED Notes (Signed)
Nurse asked to hold Heparin until Neuro speak with Dr. Scotty Court. Verbal from Dr. Scotty Court.

## 2018-12-16 NOTE — ED Provider Notes (Signed)
[facility] EMERGENCY DEPARTMENT Provider Note   CSN: [id number] Arrival date & time: 12/16/18  1534     History   Chief Complaint No chief complaint on file.   HPI Emma Rivera is a 78 y.o. female presenting for evaluation of stroke.  Patient states at 930 this morning she had inability to speak.  She went to [facility] ED, and her symptoms were resolved without intervention.  Imaging at that time showed embolism of the left M2.  While in the ER, patient had recurrence of aphasia at 155.  TPA started, patient transported.  Upon arrival to the ER, patient reports aphasia has improved.  She denies headache, vision changes, chest pain, shortness of breath, nausea, vomiting, dumping, urinary symptoms, normal bowel movements.  Patient reports a history of hypertension, no other medical problems.  She is not on blood thinners.  Pt's workup at [facility] ED reviewed.   HPI  Past Medical History:  Diagnosis Date  . Depression   . Esophageal spasm   . Esophageal spasm   . GERD (gastroesophageal reflux disease)   . Heart murmur   . HOH (hard of hearing)   . Hypertension   . Pre-diabetes     Patient Active Problem List   Diagnosis Date Noted  . CVA (cerebral vascular accident) (HCC) 12/16/2018    Past Surgical History:  Procedure Laterality Date  . CARDIAC CATHETERIZATION    . CATARACT EXTRACTION W/PHACO Left 09/09/2015   Procedure: CATARACT EXTRACTION PHACO AND INTRAOCULAR LENS PLACEMENT (IOC);  Surgeon: Lia Hopping, MD;  Location: [facility] ORS;  Service: Ophthalmology;  Laterality: Left;  [place]   1:10.5 AP     8.6 CDE  6.05 casette lot #  [id number]  . CATARACT EXTRACTION W/PHACO Right 09/30/2015   Procedure: CATARACT EXTRACTION PHACO AND INTRAOCULAR LENS PLACEMENT (IOC);  Surgeon: Lia Hopping, MD;  Location: [facility] ORS;  Service: Ophthalmology;  Laterality: Right;  [place] 01:00.3 AP%: 11.6 CDE: 7.00 FLUID LOT# [id number]  . COLONOSCOPY    . COLONOSCOPY WITH PROPOFOL N/A  04/04/2017   Procedure: COLONOSCOPY WITH PROPOFOL;  Surgeon: Scot Jun, MD;  Location: [facility] ENDOSCOPY;  Service: Endoscopy;  Laterality: N/A;  . TONSILLECTOMY       OB History   No obstetric history on file.      Home Medications    Prior to Admission medications   Medication Sig Start Date End Date Taking? Authorizing Provider  amLODipine (NORVASC) 5 MG tablet Take 5 mg by mouth daily.   Yes [provider]  aspirin 81 MG tablet Take 81 mg by mouth daily.   Yes [provider]  citalopram (CELEXA) 20 MG tablet Take 20 mg by mouth daily.   Yes [provider]  hydrochlorothiazide (HYDRODIURIL) 25 MG tablet Take 25 mg by mouth daily. 09/17/18  Yes [provider]  losartan (COZAAR) 100 MG tablet Take 100 mg by mouth daily. 09/17/18  Yes [provider]  Multiple Vitamin (MULTIVITAMIN) tablet Take 1 tablet by mouth daily.   Yes [provider]  omeprazole (PRILOSEC) 20 MG capsule Take 20 mg by mouth daily.   Yes [provider]  tolterodine (DETROL LA) 2 MG 24 hr capsule Take 2 mg by mouth 2 (two) times daily.    Yes [provider]    Family History Family History  Problem Relation Age of Onset  . Breast cancer Maternal Aunt     Social History Social History   Tobacco Use  . Smoking  status: Never Smoker  . Smokeless tobacco: Never Used  Substance Use Topics  . Alcohol use: Yes    Comment: occasional  . Drug use: No     Allergies   Sulfa antibiotics   Review of Systems Review of Systems  Neurological: Positive for speech difficulty (resolving).  All other systems reviewed and are negative.    Physical Exam Updated Vital Signs BP 128/82 (BP Location: Left Arm)   Pulse 82   Temp 98.7 F (37.1 C) (Oral)   Resp (!) 21   Ht 5\' 2"  (1.575 m)   Wt 82.3 kg   SpO2 96%   BMI 33.19 kg/m   Physical Exam Vitals signs and nursing note reviewed.  Constitutional:      General: She is not  in acute distress.    Appearance: She is well-developed.     Comments: Laying in the bed in no acute distress  HENT:     Head: Normocephalic and atraumatic.  Eyes:     Extraocular Movements: Extraocular movements intact.     Conjunctiva/sclera: Conjunctivae normal.     Pupils: Pupils are equal, round, and reactive to light.     Comments: EOMI and PERRLA.  No nystagmus.  Neck:     Musculoskeletal: Normal range of motion and neck supple.  Cardiovascular:     Rate and Rhythm: Normal rate and regular rhythm.  Pulmonary:     Effort: Pulmonary effort is normal. No respiratory distress.     Breath sounds: Normal breath sounds. No wheezing.  Abdominal:     General: There is no distension.     Palpations: Abdomen is soft.     Tenderness: There is no abdominal tenderness.  Musculoskeletal: Normal range of motion.     Comments: Strength of upper lower extremities intact bilaterally.  Sensation intact bilaterally.  Skin:    General: Skin is warm and dry.     Capillary Refill: Capillary refill takes less than 2 seconds.  Neurological:     General: No focal deficit present.     Mental Status: She is alert and oriented to person, place, and time.     GCS: GCS eye subscore is 4. GCS verbal subscore is 5. GCS motor subscore is 6.     Cranial Nerves: Cranial nerves are intact.     Sensory: Sensation is intact.     Motor: Motor function is intact.     Coordination: Coordination is intact.     Comments: No focal neurologic deficits.  CN intact.  Nose to finger intact.  Grip strength intact.  5 movement and coordination intact.      ED Treatments / Results  Labs (all labs ordered are listed, but only abnormal results are displayed) Labs Reviewed  MRSA PCR SCREENING  HEMOGLOBIN A1C  LIPID PANEL    EKG None  Radiology Ct Angio Head W Or Wo Contrast  Result Date: 12/16/2018 CLINICAL DATA:  78 y/o  F; evaluation of stroke. EXAM: CT ANGIOGRAPHY HEAD AND NECK CT PERFUSION BRAIN TECHNIQUE:  Multidetector CT imaging of the head and neck was performed using the standard protocol during bolus administration of intravenous contrast. Multiplanar CT image reconstructions and MIPs were obtained to evaluate the vascular anatomy. Carotid stenosis measurements (when applicable) are obtained utilizing NASCET criteria, using the distal internal carotid diameter as the denominator. Multiphase CT imaging of the brain was performed following IV bolus contrast injection. Subsequent parametric perfusion maps were calculated using RAPID software. CONTRAST:  100mL ISOVUE-370 IOPAMIDOL (ISOVUE-370) INJECTION  76% COMPARISON:  12/16/2018 CT head, CTA head, CTA neck. FINDINGS: CTA NECK FINDINGS Aortic arch: Standard branching. Imaged portion shows no evidence of aneurysm or dissection. No significant stenosis of the major arch vessel origins. Right carotid system: No evidence of dissection, stenosis (50% or greater) or occlusion. Left carotid system: No evidence of dissection, stenosis (50% or greater) or occlusion. Non stenotic mixed plaque of carotid bifurcation. Vertebral arteries: Codominant. No evidence of dissection, stenosis (50% or greater) or occlusion. Skeleton: Cervical spondylosis with moderate discogenic degenerative changes from C4-C7. C3-4 and C7-T1 grade 1 anterolisthesis. Predominant left-sided upper cervical facet arthropathy. Other neck: Heterogeneous thyroid. Upper chest: Negative. Review of the MIP images confirms the above findings CTA HEAD FINDINGS Anterior circulation: No significant stenosis, proximal occlusion, aneurysm, or vascular malformation. Interval resolution of left M2 occlusion (series 13, image 31 compared with prior series 11, image 26). Mild atherosclerosis of carotid siphons with mild left paraclinoid ICA stenosis. Posterior circulation: No significant stenosis, proximal occlusion, aneurysm, or vascular malformation. Venous sinuses: As permitted by contrast timing, patent. Anatomic  variants: Complete circle-of-Willis. Review of the MIP images confirms the above findings CT Brain Perfusion Findings: CBF (<30%) Volume: 0mL Perfusion (Tmax>6.0s) volume: 9mL Mismatch Volume: 9mL Infarction Location:No core infarct by standard perfusion criteria. Small focus of oligemia in the left superolateral frontal lobe. IMPRESSION: 1. Interval resolution of left M2 occlusion. No new intracranial large vessel occlusion. 2. CT perfusion demonstrates no core infarct by standard perfusion criteria. 3. CT perfusion demonstrates a 9 cc focus of oligemia in the left superolateral frontal lobe which may reflect sequelae of recent ischemia from M2 occlusion or possibly a persistent small branch occlusion. 4. Patent carotid and vertebral arteries. No dissection, aneurysm, or hemodynamically significant stenosis utilizing NASCET criteria. These results were called by telephone at the time of interpretation on 12/16/2018 at 4:08 pm to Dr. Otelia LimesLindzen , who verbally acknowledged these results. Electronically Signed   By: Mitzi HansenLance  Furusawa-Stratton M.D.   On: 12/16/2018 16:21   Ct Angio Head W Or Wo Contrast  Result Date: 12/16/2018 CLINICAL DATA:  Acute aphasia, now resolved EXAM: CT ANGIOGRAPHY HEAD AND NECK TECHNIQUE: Multidetector CT imaging of the head and neck was performed using the standard protocol during bolus administration of intravenous contrast. Multiplanar CT image reconstructions and MIPs were obtained to evaluate the vascular anatomy. Carotid stenosis measurements (when applicable) are obtained utilizing NASCET criteria, using the distal internal carotid diameter as the denominator. CONTRAST:  75mL OMNIPAQUE IOHEXOL 350 MG/ML SOLN COMPARISON:  Noncontrast head CT earlier today FINDINGS: CTA NECK FINDINGS Aortic arch: Unremarkable or covered Right carotid system: Vessels are smooth and widely patent. Probable mild atheromatous thickening of the common carotid wall. Left carotid system: Vessels are smooth and  sufficiently patent. Low-density plaque at the ICA bulb. Atheromatous wall thickening of the common carotid. Vertebral arteries: Proximal subclavian atheromatous wall thickening on the left. The vertebrals are widely patent. Skeleton: Cervical spine degeneration with C3-4 anterolisthesis. Other neck: Heterogeneous thyroid, more so on the right, without discrete nodule. Upper chest: Patchy air trapping. Review of the MIP images confirms the above findings CTA HEAD FINDINGS Anterior circulation: Left M2 branch cut off with downstream variable reconstitution of the 2 attenuated downstream vessels. There is history of new onset atrial fibrillation. No additional embolism is seen. Overall mild atheromatous changes, most notably a mild stenosis of the left ICA at the clinoid segment Posterior circulation: The vertebrobasilar arteries are smooth and widely patent. No branch occlusion or flow limiting stenosis  Venous sinuses: Patent Anatomic variants: None significant Delayed phase: Not obtained These results were called by telephone at the time of interpretation on 12/16/2018 at 11:55 am to Dr. Sharman Cheek , who verbally acknowledged these results. Review of the MIP images confirms the above findings IMPRESSION: 1. Emergent left M2 occlusion/embolism with partial reconstitution of downstream vessels. 2. Mild atherosclerosis without flow limiting stenosis or embolic source seen in the neck. Electronically Signed   By: Marnee Spring M.D.   On: 12/16/2018 12:01   Ct Angio Neck W Or Wo Contrast  Result Date: 12/16/2018 CLINICAL DATA:  78 y/o  F; evaluation of stroke. EXAM: CT ANGIOGRAPHY HEAD AND NECK CT PERFUSION BRAIN TECHNIQUE: Multidetector CT imaging of the head and neck was performed using the standard protocol during bolus administration of intravenous contrast. Multiplanar CT image reconstructions and MIPs were obtained to evaluate the vascular anatomy. Carotid stenosis measurements (when applicable) are  obtained utilizing NASCET criteria, using the distal internal carotid diameter as the denominator. Multiphase CT imaging of the brain was performed following IV bolus contrast injection. Subsequent parametric perfusion maps were calculated using RAPID software. CONTRAST:  ISOVUE-370 IOPAMIDOL (ISOVUE-370) INJECTION 76% COMPARISON:  12/16/2018 CT head, CTA head, CTA neck. FINDINGS: CTA NECK FINDINGS Aortic arch: Standard branching. Imaged portion shows no evidence of aneurysm or dissection. No significant stenosis of the major arch vessel origins. Right carotid system: No evidence of dissection, stenosis (50% or greater) or occlusion. Left carotid system: No evidence of dissection, stenosis (50% or greater) or occlusion. Non stenotic mixed plaque of carotid bifurcation. Vertebral arteries: Codominant. No evidence of dissection, stenosis (50% or greater) or occlusion. Skeleton: Cervical spondylosis with moderate discogenic degenerative changes from C4-C7. C3-4 and C7-T1 grade 1 anterolisthesis. Predominant left-sided upper cervical facet arthropathy. Other neck: Heterogeneous thyroid. Upper chest: Negative. Review of the MIP images confirms the above findings CTA HEAD FINDINGS Anterior circulation: No significant stenosis, proximal occlusion, aneurysm, or vascular malformation. Interval resolution of left M2 occlusion (series 13, image 31 compared with prior series 11, image 26). Mild atherosclerosis of carotid siphons with mild left paraclinoid ICA stenosis. Posterior circulation: No significant stenosis, proximal occlusion, aneurysm, or vascular malformation. Venous sinuses: As permitted by contrast timing, patent. Anatomic variants: Complete circle-of-Willis. Review of the MIP images confirms the above findings CT Brain Perfusion Findings: CBF (<30%) Volume: 0mL Perfusion (Tmax>6.0s) volume: 9mL Mismatch Volume: 9mL Infarction Location:No core infarct by standard perfusion criteria. Small focus of oligemia in  the left superolateral frontal lobe. IMPRESSION: 1. Interval resolution of left M2 occlusion. No new intracranial large vessel occlusion. 2. CT perfusion demonstrates no core infarct by standard perfusion criteria. 3. CT perfusion demonstrates a 9 cc focus of oligemia in the left superolateral frontal lobe which may reflect sequelae of recent ischemia from M2 occlusion or possibly a persistent small branch occlusion. 4. Patent carotid and vertebral arteries. No dissection, aneurysm, or hemodynamically significant stenosis utilizing NASCET criteria. These results were called by telephone at the time of interpretation on 12/16/2018 at 4:08 pm to Dr. Otelia Limes , who verbally acknowledged these results. Electronically Signed   By: Mitzi Hansen M.D.   On: 12/16/2018 16:21   Ct Angio Neck W And/or Wo Contrast  Result Date: 12/16/2018 CLINICAL DATA:  Acute aphasia, now resolved EXAM: CT ANGIOGRAPHY HEAD AND NECK TECHNIQUE: Multidetector CT imaging of the head and neck was performed using the standard protocol during bolus administration of intravenous contrast. Multiplanar CT image reconstructions and MIPs were obtained to evaluate the  vascular anatomy. Carotid stenosis measurements (when applicable) are obtained utilizing NASCET criteria, using the distal internal carotid diameter as the denominator. CONTRAST:  75mL OMNIPAQUE IOHEXOL 350 MG/ML SOLN COMPARISON:  Noncontrast head CT earlier today FINDINGS: CTA NECK FINDINGS Aortic arch: Unremarkable or covered Right carotid system: Vessels are smooth and widely patent. Probable mild atheromatous thickening of the common carotid wall. Left carotid system: Vessels are smooth and sufficiently patent. Low-density plaque at the ICA bulb. Atheromatous wall thickening of the common carotid. Vertebral arteries: Proximal subclavian atheromatous wall thickening on the left. The vertebrals are widely patent. Skeleton: Cervical spine degeneration with C3-4 anterolisthesis.  Other neck: Heterogeneous thyroid, more so on the right, without discrete nodule. Upper chest: Patchy air trapping. Review of the MIP images confirms the above findings CTA HEAD FINDINGS Anterior circulation: Left M2 branch cut off with downstream variable reconstitution of the 2 attenuated downstream vessels. There is history of new onset atrial fibrillation. No additional embolism is seen. Overall mild atheromatous changes, most notably a mild stenosis of the left ICA at the clinoid segment Posterior circulation: The vertebrobasilar arteries are smooth and widely patent. No branch occlusion or flow limiting stenosis Venous sinuses: Patent Anatomic variants: None significant Delayed phase: Not obtained These results were called by telephone at the time of interpretation on 12/16/2018 at 11:55 am to Dr. Sharman Cheek , who verbally acknowledged these results. Review of the MIP images confirms the above findings IMPRESSION: 1. Emergent left M2 occlusion/embolism with partial reconstitution of downstream vessels. 2. Mild atherosclerosis without flow limiting stenosis or embolic source seen in the neck. Electronically Signed   By: Marnee Spring M.D.   On: 12/16/2018 12:01   Ct Cerebral Perfusion W Contrast  Result Date: 12/16/2018 CLINICAL DATA:  78 y/o  F; evaluation of stroke. EXAM: CT ANGIOGRAPHY HEAD AND NECK CT PERFUSION BRAIN TECHNIQUE: Multidetector CT imaging of the head and neck was performed using the standard protocol during bolus administration of intravenous contrast. Multiplanar CT image reconstructions and MIPs were obtained to evaluate the vascular anatomy. Carotid stenosis measurements (when applicable) are obtained utilizing NASCET criteria, using the distal internal carotid diameter as the denominator. Multiphase CT imaging of the brain was performed following IV bolus contrast injection. Subsequent parametric perfusion maps were calculated using RAPID software. CONTRAST:  ISOVUE-370  IOPAMIDOL (ISOVUE-370) INJECTION 76% COMPARISON:  12/16/2018 CT head, CTA head, CTA neck. FINDINGS: CTA NECK FINDINGS Aortic arch: Standard branching. Imaged portion shows no evidence of aneurysm or dissection. No significant stenosis of the major arch vessel origins. Right carotid system: No evidence of dissection, stenosis (50% or greater) or occlusion. Left carotid system: No evidence of dissection, stenosis (50% or greater) or occlusion. Non stenotic mixed plaque of carotid bifurcation. Vertebral arteries: Codominant. No evidence of dissection, stenosis (50% or greater) or occlusion. Skeleton: Cervical spondylosis with moderate discogenic degenerative changes from C4-C7. C3-4 and C7-T1 grade 1 anterolisthesis. Predominant left-sided upper cervical facet arthropathy. Other neck: Heterogeneous thyroid. Upper chest: Negative. Review of the MIP images confirms the above findings CTA HEAD FINDINGS Anterior circulation: No significant stenosis, proximal occlusion, aneurysm, or vascular malformation. Interval resolution of left M2 occlusion (series 13, image 31 compared with prior series 11, image 26). Mild atherosclerosis of carotid siphons with mild left paraclinoid ICA stenosis. Posterior circulation: No significant stenosis, proximal occlusion, aneurysm, or vascular malformation. Venous sinuses: As permitted by contrast timing, patent. Anatomic variants: Complete circle-of-Willis. Review of the MIP images confirms the above findings CT Brain Perfusion Findings: CBF (<30%)  Volume: 38mL Perfusion (Tmax>6.0s) volume: 70mL Mismatch Volume: 28mL Infarction Location:No core infarct by standard perfusion criteria. Small focus of oligemia in the left superolateral frontal lobe. IMPRESSION: 1. Interval resolution of left M2 occlusion. No new intracranial large vessel occlusion. 2. CT perfusion demonstrates no core infarct by standard perfusion criteria. 3. CT perfusion demonstrates a 9 cc focus of oligemia in the left  superolateral frontal lobe which may reflect sequelae of recent ischemia from M2 occlusion or possibly a persistent small branch occlusion. 4. Patent carotid and vertebral arteries. No dissection, aneurysm, or hemodynamically significant stenosis utilizing NASCET criteria. These results were called by telephone at the time of interpretation on 12/16/2018 at 4:08 pm to Dr. Otelia Limes , who verbally acknowledged these results. Electronically Signed   By: Mitzi Hansen M.D.   On: 12/16/2018 16:21   Ct Head Code Stroke Wo Contrast  Result Date: 12/16/2018 CLINICAL DATA:  Code stroke. Speech difficulty beginning at 9:30 a.m. EXAM: CT HEAD WITHOUT CONTRAST TECHNIQUE: Contiguous axial images were obtained from the base of the skull through the vertex without intravenous contrast. COMPARISON:  None. FINDINGS: Brain: No evidence of acute infarction, hemorrhage, hydrocephalus, extra-axial collection or mass lesion/mass effect. Mild for age cerebral volume loss and chronic small-vessel ischemia in the cerebral white matter. Vascular: No hyperdense vessel or unexpected calcification. Skull: Normal. Negative for fracture or focal lesion. Sinuses/Orbits: Bilateral cataract resection Other: These results were called by telephone at the time of interpretation on 12/16/2018 at 11:14 am to Dr. Sharman Cheek , who verbally acknowledged these results. ASPECTS [facility] Stroke Program Early CT Score) - Ganglionic level infarction (caudate, lentiform nuclei, internal capsule, insula, M1-M3 cortex): 7 - Supraganglionic infarction (M4-M6 cortex): 3 Total score (0-10 with 10 being normal): 10 IMPRESSION: Negative for hemorrhage or visible acute infarct. Electronically Signed   By: Marnee Spring M.D.   On: 12/16/2018 11:15    Procedures Procedures (including critical care time)  Medications Ordered in ED Medications   stroke: mapping our early stages of recovery book (has no administration in time range)  0.9 %  sodium  chloride infusion ( Intravenous IV Pump [employer] 12/17/18 0024)  pantoprazole (PROTONIX) injection 40 mg (40 mg Intravenous Given 12/16/18 2248)  clevidipine (CLEVIPREX) infusion 0.5 mg/mL (0 mg/hr Intravenous Hold 12/16/18 1825)  senna-docusate (Senokot-S) tablet 1 tablet (has no administration in time range)  iopamidol (ISOVUE-370) 76 % injection 100 mL (100 mLs Intravenous Contrast Given 12/16/18 1557)     Initial Impression / Assessment and Plan / ED Course  I have reviewed the triage vital signs and the nursing notes.  Pertinent labs & imaging results that were available during my care of the patient were reviewed by me and considered in my medical decision making (see chart for details).     Pt presenting for evaluation of aphasia and stroke.  Physical exam upon my evaluation reassuring, no neuro deficits.  Patient has already received TPA.  Repeat CT head imaging was obtained upon arrival to the ED.  Patient to be admitted by neurology.  CTA shows improvement of M2 occlusion. On reassessment, pt remains stable and without new neuro deficits.   Final Clinical Impressions(s) / ED Diagnoses   Final diagnoses:  Cerebrovascular accident (CVA) due to embolism of cerebral artery [facility])    ED Discharge Orders    None       Alveria Apley, PA-C 12/17/18 0045    Maia Plan, MD 12/17/18 1325

## 2018-12-16 NOTE — ED Notes (Signed)
Pt stating some issues in the last 3 months with confusion when arriving to places she knows, but was unsure what was causing it.

## 2018-12-17 ENCOUNTER — Inpatient Hospital Stay (HOSPITAL_COMMUNITY): Payer: Medicare Other

## 2018-12-17 ENCOUNTER — Ambulatory Visit (HOSPITAL_COMMUNITY): Payer: Medicare Other

## 2018-12-17 DIAGNOSIS — I6300 Cerebral infarction due to thrombosis of unspecified precerebral artery: Secondary | ICD-10-CM

## 2018-12-17 DIAGNOSIS — I4819 Other persistent atrial fibrillation: Secondary | ICD-10-CM

## 2018-12-17 LAB — MRSA PCR SCREENING: MRSA BY PCR: NEGATIVE

## 2018-12-17 LAB — LIPID PANEL
Cholesterol: 164 mg/dL (ref 0–200)
HDL: 56 mg/dL (ref >40)
LDL Cholesterol: 97 mg/dL (ref 0–99)
Total CHOL/HDL Ratio: 2.9 RATIO
Triglycerides: 54 mg/dL (ref <150)
VLDL: 11 mg/dL (ref 0–40)

## 2018-12-17 LAB — HEMOGLOBIN A1C
Hgb A1c MFr Bld: 5.7 % — ABNORMAL HIGH (ref 4.8–5.6)
Mean Plasma Glucose: 116.89 mg/dL

## 2018-12-17 MED ORDER — ONE-DAILY MULTI VITAMINS PO TABS: 1.0000 | ORAL_TABLET | Freq: Every day | ORAL | Status: DC

## 2018-12-17 MED ORDER — ATORVASTATIN CALCIUM 10 MG PO TABS
20.0000 mg | ORAL_TABLET | Freq: Every day | ORAL | Status: DC
  Administered 2018-12-17: 20 mg via ORAL
  Filled 2018-12-17: qty 2

## 2018-12-17 MED ORDER — CITALOPRAM HYDROBROMIDE 10 MG PO TABS
20.0000 mg | ORAL_TABLET | Freq: Every day | ORAL | Status: DC
  Administered 2018-12-17 – 2018-12-18 (×2): 20 mg via ORAL
  Filled 2018-12-17 (×2): qty 2

## 2018-12-17 MED ORDER — APIXABAN 5 MG PO TABS
5.0000 mg | ORAL_TABLET | Freq: Two times a day (BID) | ORAL | Status: DC
  Administered 2018-12-17 – 2018-12-18 (×2): 5 mg via ORAL
  Filled 2018-12-17 (×2): qty 1

## 2018-12-17 MED ORDER — ADULT MULTIVITAMIN W/MINERALS CH
1.0000 | ORAL_TABLET | Freq: Every day | ORAL | Status: DC
  Administered 2018-12-17 – 2018-12-18 (×2): 1 via ORAL
  Filled 2018-12-17 (×2): qty 1

## 2018-12-17 MED ORDER — FESOTERODINE FUMARATE ER 4 MG PO TB24
4.0000 mg | ORAL_TABLET | Freq: Every day | ORAL | Status: DC
  Administered 2018-12-17 – 2018-12-18 (×2): 4 mg via ORAL
  Filled 2018-12-17 (×2): qty 1

## 2018-12-17 NOTE — Evaluation (Signed)
Speech Language Pathology Evaluation Patient Details Name: Emma Rivera MRN: [id number] DOB: [birth year] Today's Date: 12/17/2018 Time: [id number] SLP Time Calculation (min) (ACUTE ONLY): 29 min  Problem List:  Patient Active Problem List   Diagnosis Date Noted  . CVA (cerebral vascular accident) (HCC) 12/16/2018   Past Medical History:  Past Medical History:  Diagnosis Date  . Depression   . Esophageal spasm   . Esophageal spasm   . GERD (gastroesophageal reflux disease)   . Heart murmur   . HOH (hard of hearing)   . Hypertension   . Pre-diabetes    Past Surgical History:  Past Surgical History:  Procedure Laterality Date  . CARDIAC CATHETERIZATION    . CATARACT EXTRACTION W/PHACO Left 09/09/2015   Procedure: CATARACT EXTRACTION PHACO AND INTRAOCULAR LENS PLACEMENT (IOC);  Surgeon: Lia HoppingNisha Mukherjee, MD;  Location: [facility] ORS;  Service: Ophthalmology;  Laterality: Left;  US   1:10.5 AP     8.6 CDE  6.05 casette lot #  [id number]  . CATARACT EXTRACTION W/PHACO Right 09/30/2015   Procedure: CATARACT EXTRACTION PHACO AND INTRAOCULAR LENS PLACEMENT (IOC);  Surgeon: Lia HoppingNisha Mukherjee, MD;  Location: [facility] ORS;  Service: Ophthalmology;  Laterality: Right;  US 01:00.3 AP%: 11.6 CDE: 7.00 FLUID LOT# [id number]  . COLONOSCOPY    . COLONOSCOPY WITH PROPOFOL N/A 04/04/2017   Procedure: COLONOSCOPY WITH PROPOFOL;  Surgeon: Scot JunElliott, Robert T, MD;  [facility];  Service: Endoscopy;  Laterality: N/A;  . TONSILLECTOMY     HPI:  Pt is a 78 y.o. female with history of prediabetes, hypertension, heart murmur, esophageal spasms and hard of hearing.  He presented to [facility] due to her not being able to get her words out. While in the ED all her symptoms had resolved but at 1358 her symptoms again were present and then disappeared at 1430. The CT of the head of 12/16/18 was negative for hemorrhage or visible acute infarct. A repeat CT and MRI have been ordered but have not been completed  as yet.    Assessment / Plan / Recommendation Clinical Impression  Pt participated in speech/language evaluation with her daughters, husband, and minister present. The pt reported that her spech has been improving, and is currently approximately 85% back to baseline but she still has difficulty with word retrieval. The pt's family agreed, that her language skills are notably improved but are still not at baseline. Pt presents with mild expressive aphasia characterized by impaired word retrieval and mild difficulty with auditory comprehension of complex information. She benefited from additional processing time and this strategy was sucessfull in facilitating accuracy but she expressed that the need for this time has caused her fatigue. Skilled SLP services are clinically indicated at this time for aphasia intervention.     SLP Assessment  SLP Recommendation/Assessment: Patient needs continued Speech Lanaguage Pathology Services SLP Visit Diagnosis: Aphasia (R47.01)    Follow Up Recommendations  Outpatient SLP    Frequency and Duration min 2x/week  2 weeks      SLP Evaluation Cognition  Overall Cognitive Status: Within Functional Limits for tasks assessed Arousal/Alertness: Awake/alert Orientation Level: Oriented X4 Attention: Focused Focused Attention: Appears intact Memory: Appears intact(Immediate: 3/3; Delayed 2/3; 3/3 with min cues) Awareness: Appears intact Safety/Judgment: Appears intact       Comprehension  Auditory Comprehension Overall Auditory Comprehension: Appears within functional limits for tasks assessed Yes/No Questions: Within Functional Limits(Increased processing time needed for complex yes/no) Commands: Within Functional Limits Conversation: Complex EffectiveTechniques: Extra processing time Reading  Comprehension Reading Status: Within funtional limits    Expression Expression Primary Mode of Expression: Verbal Verbal Expression Overall Verbal Expression:  Impaired(MIldly) Initiation: Impaired Automatic Speech: Counting;Day of week;Month of year(WNL) Level of Generative/Spontaneous Verbalization: Conversation Repetition: No impairment Naming: Impairment Responsive: (5/5) Confrontation: (10/10) Divergent: Other (comment)(Concrete: 12/minute; Abstract: 7/minute) Other Naming Comments: Pt required additional processing time during responsive naming tasks.  Pragmatics: No impairment   Oral / Motor  Oral Motor/Sensory Function Overall Oral Motor/Sensory Function: Within functional limits Motor Speech Overall Motor Speech: Appears within functional limits for tasks assessed Respiration: Within functional limits Phonation: Normal Resonance: Within functional limits Articulation: Within functional limitis Intelligibility: Intelligible Motor Planning: Witnin functional limits Motor Speech Errors: Not applicable   Emma Weckwerth I. Emma Clock, MS, CCC-SLP Acute Rehabilitation Services Office number [phone] Pager [phone]                   Emma Rivera 12/17/2018, 10:29 AM

## 2018-12-17 NOTE — CV Procedure (Signed)
2D echo attempted but pt in chair and OT is waiting to see her. Will try later

## 2018-12-17 NOTE — Consult Note (Addendum)
Cardiology Consultation:   Patient ID: Emma Rivera MRN: [id number]; DOB: May 05, 1941  Admit date: 12/16/2018 Date of Consult: 12/17/2018  Primary Care Provider: [name], MD Primary Cardiologist: No primary care provider on file. new lives in [place] MD there Primary Electrophysiologist:  None    Patient Profile:   Emma Rivera is a 78 y.o. female with a hx of HTN, murmur, pre diabetes and coronary cath with no disease many years ago though records say PTCA who is being seen today for the evaluation of a fib with admit yesterday for CVA at the request of Dr. Pearlean Rivera.  History of Present Illness:   Emma Rivera with hx of HTN, murmur, and pre-diabetes and hx of coronary cath but no coronary artery disease, no angioplasty and told she had spasm or aorta. Echo 01/2017 with normal LV function, also normal RV function.  Mild valvular stenosis.    She was in her usual state of health until yesterday and developed expressive aphasia while on the phone.  Also with staring into space.  On presentation to ER was in a fib.   Her symptoms returned prior to transport to [facility] tPA given at that time  Pt has no awareness of a fib, she notes in the past may have felt irregular HR but did not last long.  She denies chest pain and she chronically has DOE more so with walking up the stairs, she does do silver sneakers and no SOB with that exercise.             EKG:  The EKG was personally reviewed and demonstrates:  A fib rate controlled at 96  Telemetry:  Telemetry was personally reviewed and demonstrates:  A fib rate up to 130s at times, mostly around 100  Labs  Troponin <0.3  Na 138 K+ 4.0  Cr. 1.06 LDL 97, TChol 164 HDL 56 Hgb 14.2, WBC 9.3 plts 215.   Past Medical History:  Diagnosis Date  . Depression   . Esophageal spasm   . Esophageal spasm   . GERD (gastroesophageal reflux disease)   . Heart murmur   . HOH (hard of hearing)   . Hypertension   . Pre-diabetes     Past Surgical History:   Procedure Laterality Date  . CARDIAC CATHETERIZATION    . CATARACT EXTRACTION W/PHACO Left 09/09/2015   Procedure: CATARACT EXTRACTION PHACO AND INTRAOCULAR LENS PLACEMENT (IOC);  Surgeon: Lia Hopping, MD;  Location: [facility] ORS;  Service: Ophthalmology;  Laterality: Left;  [place]   1:10.5 AP     8.6 CDE  6.05 casette lot #  [id number]  . CATARACT EXTRACTION W/PHACO Right 09/30/2015   Procedure: CATARACT EXTRACTION PHACO AND INTRAOCULAR LENS PLACEMENT (IOC);  Surgeon: Lia Hopping, MD;  Location: [facility] ORS;  Service: Ophthalmology;  Laterality: Right;  [place] 01:00.3 AP%: 11.6 CDE: 7.00 FLUID LOT# [id number]  . COLONOSCOPY    . COLONOSCOPY WITH PROPOFOL N/A 04/04/2017   Procedure: COLONOSCOPY WITH PROPOFOL;  Surgeon: Scot Jun, MD;  Location: [facility] ENDOSCOPY;  Service: Endoscopy;  Laterality: N/A;  . TONSILLECTOMY       Home Medications:  Prior to Admission medications   Medication Sig Start Date End Date Taking? Authorizing Provider  amLODipine (NORVASC) 5 MG tablet Take 5 mg by mouth daily.   Yes [provider]  aspirin 81 MG tablet Take 81 mg by mouth daily.   Yes [provider]  citalopram (CELEXA) 20 MG tablet Take 20 mg by mouth daily.  Yes [provider]  hydrochlorothiazide (HYDRODIURIL) 25 MG tablet Take 25 mg by mouth daily. 09/17/18  Yes [provider]  losartan (COZAAR) 100 MG tablet Take 100 mg by mouth daily. 09/17/18  Yes [provider]  Multiple Vitamin (MULTIVITAMIN) tablet Take 1 tablet by mouth daily.   Yes [provider]  omeprazole (PRILOSEC) 20 MG capsule Take 20 mg by mouth daily.   Yes [provider]  tolterodine (DETROL LA) 2 MG 24 hr capsule Take 2 mg by mouth 2 (two) times daily.    Yes [provider]    Inpatient Medications: Scheduled Meds: . atorvastatin  20 mg Oral q1800  . citalopram  20 mg Oral Daily  . fesoterodine  4 mg Oral Daily  . multivitamin with minerals  1 tablet  Oral Daily  . pantoprazole (PROTONIX) IV  40 mg Intravenous QHS   Continuous Infusions: . sodium chloride 75 mL/hr at 12/17/18 0751  . clevidipine Stopped (12/16/18 1825)   PRN Meds: senna-docusate  Allergies:    Allergies  Allergen Reactions  . Sulfa Antibiotics Nausea Only    Social History:   Social History   Socioeconomic History  . Marital status: Married    Spouse name: Not on file  . Number of children: Not on file  . Years of education: Not on file  . Highest education level: Not on file  Occupational History  . Not on file  Social Needs  . Financial resource strain: Not on file  . Food insecurity:    Worry: Not on file    Inability: Not on file  . Transportation needs:    Medical: Not on file    Non-medical: Not on file  Tobacco Use  . Smoking status: Never Smoker  . Smokeless tobacco: Never Used  Substance and Sexual Activity  . Alcohol use: Yes    Comment: occasional  . Drug use: No  . Sexual activity: Not on file  Lifestyle  . Physical activity:    Days per week: Not on file    Minutes per session: Not on file  . Stress: Not on file  Relationships  . Social connections:    Talks on phone: Not on file    Gets together: Not on file    Attends religious service: Not on file    Active member of club or organization: Not on file    Attends meetings of clubs or organizations: Not on file    Relationship status: Not on file  . Intimate partner violence:    Fear of current or ex partner: Not on file    Emotionally abused: Not on file    Physically abused: Not on file    Forced sexual activity: Not on file  Other Topics Concern  . Not on file  Social History Narrative  . Not on file    Family History:    Family History  Problem Relation Age of Onset  . Breast cancer Maternal Aunt      ROS:  Please see the history of present illness.  General:no colds or fevers, no weight changes Skin:no rashes or ulcers HEENT:no blurred vision, no  congestion CV:see HPI PUL:see HPI GI:no diarrhea constipation or melena, no indigestion GU:no hematuria, no dysuria MS:no joint pain, no claudication Neuro:no syncope, no lightheadedness Endo:pre- diabetes, no thyroid disease  All other ROS reviewed and negative.     Physical Exam/Data:   Vitals:   12/17/18 0900 12/17/18 1000 12/17/18 1100 12/17/18 1200  BP: 101/69 123/89 117/71   Pulse: (!) 107 (!) 50    Resp: 16 14    Temp:    98.8 F (37.1 C)  TempSrc:    Oral  SpO2: 94% 96% 96%   Weight:      Height:        Intake/Output Summary (Last 24 hours) at 12/17/2018 1357 Last data filed at 12/17/2018 1100 Gross per 24 hour  Intake 645.71 ml  Output 100 ml  Net 545.71 ml   Last 3 Weights 12/16/2018 12/16/2018 09/09/2015  Weight (lbs) 181 lb 7 oz 175 lb 170 lb  Weight (kg) 82.3 kg 79.379 kg 77.111 kg     Body mass index is 33.19 kg/m.  General:  Well nourished, well developed, in no acute distress HEENT: normal Lymph: no adenopathy Neck: no JVD Endocrine:  No thryomegaly Vascular: No carotid bruits; pedal pulses 2+ bilaterally   Cardiac:  irreg irreg; no murmur gallup rub or click Lungs:  clear to auscultation bilaterally, no wheezing, rhonchi or rales  Abd: soft, nontender, no hepatomegaly  Ext: no edema Musculoskeletal:  No deformities, BUE and BLE strength normal and equal Skin: warm and dry  Neuro:  Alert and oriented X 3 MAE follows commands, no focal abnormalities noted Psych:  Normal affect    Relevant CV Studies: Echo pending  Laboratory Data:  Chemistry Recent Labs  Lab 12/16/18 1058  NA 138  K 4.0  CL 103  CO2 27  GLUCOSE 104*  BUN 19  CREATININE 1.06*  CALCIUM 9.3  GFRNONAA 51*  GFRAA 59*  ANIONGAP 8    Recent Labs  Lab 12/16/18 1058  PROT 7.3  ALBUMIN 3.9  AST 26  ALT 22  ALKPHOS 49  BILITOT 1.3*   Hematology Recent Labs  Lab 12/16/18 1058  WBC 9.3  RBC 4.44  HGB 14.2  HCT 43.3  MCV 97.5  MCH 32.0  MCHC 32.8  RDW 13.1    PLT 215   Cardiac Enzymes Recent Labs  Lab 12/16/18 1058  TROPONINI <0.03   No results for input(s): TROPIPOC in the last 168 hours.  BNPNo results for input(s): BNP, PROBNP in the last 168 hours.  DDimer No results for input(s): DDIMER in the last 168 hours.  Radiology/Studies:  Ct Angio Head W Or Wo Contrast  Result Date: 12/16/2018 CLINICAL DATA:  78 y/o  F; evaluation of stroke. EXAM: CT ANGIOGRAPHY HEAD AND NECK CT PERFUSION BRAIN TECHNIQUE: Multidetector CT imaging of the head and neck was performed using the standard protocol during bolus administration of intravenous contrast. Multiplanar CT image reconstructions and MIPs were obtained to evaluate the vascular anatomy. Carotid stenosis measurements (when applicable) are obtained utilizing NASCET criteria, using the distal internal carotid diameter as the denominator. Multiphase CT imaging of the brain was performed following IV bolus contrast injection. Subsequent parametric perfusion maps were calculated using RAPID software. CONTRAST:  ISOVUE-370 IOPAMIDOL (ISOVUE-370) INJECTION 76% COMPARISON:  12/16/2018 CT head, CTA head, CTA neck. FINDINGS: CTA NECK FINDINGS Aortic arch: Standard branching. Imaged portion shows no evidence of aneurysm or dissection. No significant stenosis of the major arch vessel origins. Right carotid system: No evidence of dissection, stenosis (50% or greater) or occlusion. Left carotid system: No evidence of dissection, stenosis (50% or greater) or occlusion. Non stenotic mixed plaque of carotid bifurcation. Vertebral arteries: Codominant. No evidence of dissection, stenosis (50% or greater) or occlusion. Skeleton: Cervical spondylosis with moderate discogenic degenerative changes from C4-C7. C3-4 and C7-T1 grade 1 anterolisthesis.  Predominant left-sided upper cervical facet arthropathy. Other neck: Heterogeneous thyroid. Upper chest: Negative. Review of the MIP images confirms the above findings CTA HEAD  FINDINGS Anterior circulation: No significant stenosis, proximal occlusion, aneurysm, or vascular malformation. Interval resolution of left M2 occlusion (series 13, image 31 compared with prior series 11, image 26). Mild atherosclerosis of carotid siphons with mild left paraclinoid ICA stenosis. Posterior circulation: No significant stenosis, proximal occlusion, aneurysm, or vascular malformation. Venous sinuses: As permitted by contrast timing, patent. Anatomic variants: Complete circle-of-Willis. Review of the MIP images confirms the above findings CT Brain Perfusion Findings: CBF (<30%) Volume: 57mL Perfusion (Tmax>6.0s) volume: 51mL Mismatch Volume: 34mL Infarction Location:No core infarct by standard perfusion criteria. Small focus of oligemia in the left superolateral frontal lobe. IMPRESSION: 1. Interval resolution of left M2 occlusion. No new intracranial large vessel occlusion. 2. CT perfusion demonstrates no core infarct by standard perfusion criteria. 3. CT perfusion demonstrates a 9 cc focus of oligemia in the left superolateral frontal lobe which may reflect sequelae of recent ischemia from M2 occlusion or possibly a persistent small branch occlusion. 4. Patent carotid and vertebral arteries. No dissection, aneurysm, or hemodynamically significant stenosis utilizing NASCET criteria. These results were called by telephone at the time of interpretation on 12/16/2018 at 4:08 pm to Dr. Otelia Limes , who verbally acknowledged these results. Electronically Signed   By: Mitzi Hansen M.D.   On: 12/16/2018 16:21   Ct Angio Head W Or Wo Contrast  Result Date: 12/16/2018 CLINICAL DATA:  Acute aphasia, now resolved EXAM: CT ANGIOGRAPHY HEAD AND NECK TECHNIQUE: Multidetector CT imaging of the head and neck was performed using the standard protocol during bolus administration of intravenous contrast. Multiplanar CT image reconstructions and MIPs were obtained to evaluate the vascular anatomy. Carotid stenosis  measurements (when applicable) are obtained utilizing NASCET criteria, using the distal internal carotid diameter as the denominator. CONTRAST:  70mL OMNIPAQUE IOHEXOL 350 MG/ML SOLN COMPARISON:  Noncontrast head CT earlier today FINDINGS: CTA NECK FINDINGS Aortic arch: Unremarkable or covered Right carotid system: Vessels are smooth and widely patent. Probable mild atheromatous thickening of the common carotid wall. Left carotid system: Vessels are smooth and sufficiently patent. Low-density plaque at the ICA bulb. Atheromatous wall thickening of the common carotid. Vertebral arteries: Proximal subclavian atheromatous wall thickening on the left. The vertebrals are widely patent. Skeleton: Cervical spine degeneration with C3-4 anterolisthesis. Other neck: Heterogeneous thyroid, more so on the right, without discrete nodule. Upper chest: Patchy air trapping. Review of the MIP images confirms the above findings CTA HEAD FINDINGS Anterior circulation: Left M2 branch cut off with downstream variable reconstitution of the 2 attenuated downstream vessels. There is history of new onset atrial fibrillation. No additional embolism is seen. Overall mild atheromatous changes, most notably a mild stenosis of the left ICA at the clinoid segment Posterior circulation: The vertebrobasilar arteries are smooth and widely patent. No branch occlusion or flow limiting stenosis Venous sinuses: Patent Anatomic variants: None significant Delayed phase: Not obtained These results were called by telephone at the time of interpretation on 12/16/2018 at 11:55 am to Dr. Sharman Cheek , who verbally acknowledged these results. Review of the MIP images confirms the above findings IMPRESSION: 1. Emergent left M2 occlusion/embolism with partial reconstitution of downstream vessels. 2. Mild atherosclerosis without flow limiting stenosis or embolic source seen in the neck. Electronically Signed   By: Marnee Spring M.D.   On: 12/16/2018 12:01    Ct Angio Neck W Or Wo Contrast  Result  Date: 12/16/2018 CLINICAL DATA:  78 y/o  F; evaluation of stroke. EXAM: CT ANGIOGRAPHY HEAD AND NECK CT PERFUSION BRAIN TECHNIQUE: Multidetector CT imaging of the head and neck was performed using the standard protocol during bolus administration of intravenous contrast. Multiplanar CT image reconstructions and MIPs were obtained to evaluate the vascular anatomy. Carotid stenosis measurements (when applicable) are obtained utilizing NASCET criteria, using the distal internal carotid diameter as the denominator. Multiphase CT imaging of the brain was performed following IV bolus contrast injection. Subsequent parametric perfusion maps were calculated using RAPID software. CONTRAST:  ISOVUE-370 IOPAMIDOL (ISOVUE-370) INJECTION 76% COMPARISON:  12/16/2018 CT head, CTA head, CTA neck. FINDINGS: CTA NECK FINDINGS Aortic arch: Standard branching. Imaged portion shows no evidence of aneurysm or dissection. No significant stenosis of the major arch vessel origins. Right carotid system: No evidence of dissection, stenosis (50% or greater) or occlusion. Left carotid system: No evidence of dissection, stenosis (50% or greater) or occlusion. Non stenotic mixed plaque of carotid bifurcation. Vertebral arteries: Codominant. No evidence of dissection, stenosis (50% or greater) or occlusion. Skeleton: Cervical spondylosis with moderate discogenic degenerative changes from C4-C7. C3-4 and C7-T1 grade 1 anterolisthesis. Predominant left-sided upper cervical facet arthropathy. Other neck: Heterogeneous thyroid. Upper chest: Negative. Review of the MIP images confirms the above findings CTA HEAD FINDINGS Anterior circulation: No significant stenosis, proximal occlusion, aneurysm, or vascular malformation. Interval resolution of left M2 occlusion (series 13, image 31 compared with prior series 11, image 26). Mild atherosclerosis of carotid siphons with mild left paraclinoid ICA stenosis.  Posterior circulation: No significant stenosis, proximal occlusion, aneurysm, or vascular malformation. Venous sinuses: As permitted by contrast timing, patent. Anatomic variants: Complete circle-of-Willis. Review of the MIP images confirms the above findings CT Brain Perfusion Findings: CBF (<30%) Volume: 0mL Perfusion (Tmax>6.0s) volume: 9mL Mismatch Volume: 9mL Infarction Location:No core infarct by standard perfusion criteria. Small focus of oligemia in the left superolateral frontal lobe. IMPRESSION: 1. Interval resolution of left M2 occlusion. No new intracranial large vessel occlusion. 2. CT perfusion demonstrates no core infarct by standard perfusion criteria. 3. CT perfusion demonstrates a 9 cc focus of oligemia in the left superolateral frontal lobe which may reflect sequelae of recent ischemia from M2 occlusion or possibly a persistent small branch occlusion. 4. Patent carotid and vertebral arteries. No dissection, aneurysm, or hemodynamically significant stenosis utilizing NASCET criteria. These results were called by telephone at the time of interpretation on 12/16/2018 at 4:08 pm to Dr. Otelia Limes , who verbally acknowledged these results. Electronically Signed   By: Mitzi Hansen M.D.   On: 12/16/2018 16:21   Ct Angio Neck W And/or Wo Contrast  Result Date: 12/16/2018 CLINICAL DATA:  Acute aphasia, now resolved EXAM: CT ANGIOGRAPHY HEAD AND NECK TECHNIQUE: Multidetector CT imaging of the head and neck was performed using the standard protocol during bolus administration of intravenous contrast. Multiplanar CT image reconstructions and MIPs were obtained to evaluate the vascular anatomy. Carotid stenosis measurements (when applicable) are obtained utilizing NASCET criteria, using the distal internal carotid diameter as the denominator. CONTRAST:  75mL OMNIPAQUE IOHEXOL 350 MG/ML SOLN COMPARISON:  Noncontrast head CT earlier today FINDINGS: CTA NECK FINDINGS Aortic arch: Unremarkable or covered  Right carotid system: Vessels are smooth and widely patent. Probable mild atheromatous thickening of the common carotid wall. Left carotid system: Vessels are smooth and sufficiently patent. Low-density plaque at the ICA bulb. Atheromatous wall thickening of the common carotid. Vertebral arteries: Proximal subclavian atheromatous wall thickening on the left. The vertebrals  are widely patent. Skeleton: Cervical spine degeneration with C3-4 anterolisthesis. Other neck: Heterogeneous thyroid, more so on the right, without discrete nodule. Upper chest: Patchy air trapping. Review of the MIP images confirms the above findings CTA HEAD FINDINGS Anterior circulation: Left M2 branch cut off with downstream variable reconstitution of the 2 attenuated downstream vessels. There is history of new onset atrial fibrillation. No additional embolism is seen. Overall mild atheromatous changes, most notably a mild stenosis of the left ICA at the clinoid segment Posterior circulation: The vertebrobasilar arteries are smooth and widely patent. No branch occlusion or flow limiting stenosis Venous sinuses: Patent Anatomic variants: None significant Delayed phase: Not obtained These results were called by telephone at the time of interpretation on 12/16/2018 at 11:55 am to Dr. Sharman CheekPHILLIP STAFFORD , who verbally acknowledged these results. Review of the MIP images confirms the above findings IMPRESSION: 1. Emergent left M2 occlusion/embolism with partial reconstitution of downstream vessels. 2. Mild atherosclerosis without flow limiting stenosis or embolic source seen in the neck. Electronically Signed   By: Marnee SpringJonathon  Watts M.D.   On: 12/16/2018 12:01   Mr Brain Wo Contrast  Result Date: 12/17/2018 CLINICAL DATA:  Stroke, follow-up. Left MCA branch vessel occlusion. Status post tPA. EXAM: MRI HEAD WITHOUT CONTRAST TECHNIQUE: Multiplanar, multiecho pulse sequences of the brain and surrounding structures were obtained without intravenous  contrast. COMPARISON:  CTA head and neck 12/16/2018 FINDINGS: Brain: 3 focal areas of restricted diffusion are consistent with small foci of acute nonhemorrhagic infarction. The largest involves the left insular cortex measuring up to 6 mm. Two subcortical areas are present in the subinsular region and higher anterior left frontal lobe. T2 signal changes are associated with each of these areas. Moderate atrophy and white matter disease is present in addition. Dilated perivascular spaces are present in the basal ganglia. White matter changes extend into the brainstem. Remote posterior inferior left cerebellar infarct is present. The ventricles are of proportionate to the degree of atrophy. No significant extraaxial fluid collection is present. Vascular: Flow is present in the major intracranial arteries. Skull and upper cervical spine: The craniocervical junction is normal. Upper cervical spine is within normal limits. Marrow signal is unremarkable. Sinuses/Orbits: The paranasal sinuses and mastoid air cells are clear. Bilateral lens replacements are noted. Globes and orbits are otherwise unremarkable. IMPRESSION: 1. Three punctate focal areas of restricted diffusion indicating acute nonhemorrhagic infarction in the left MCA territory without other larger confluent infarct. The largest area involves the left insular cortex. 2. Atrophy and white matter disease is moderately advanced for age. This is consistent with chronic microvascular ischemic changes. 3. White matter disease extends into the brainstem. 4. 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 Electronically Signed   By: Marin Robertshristopher  Mattern M.D.   On: 12/17/2018 12:04   Ct Cerebral Perfusion W Contrast  Result Date: 12/16/2018 CLINICAL DATA:  78 y/o  F; evaluation of stroke. EXAM: CT ANGIOGRAPHY HEAD AND NECK CT PERFUSION BRAIN TECHNIQUE: Multidetector CT imaging of the head and neck was performed using the standard protocol during bolus  administration of intravenous contrast. Multiplanar CT image reconstructions and MIPs were obtained to evaluate the vascular anatomy. Carotid stenosis measurements (when applicable) are obtained utilizing NASCET criteria, using the distal internal carotid diameter as the denominator. Multiphase CT imaging of the brain was performed following IV bolus contrast injection. Subsequent parametric perfusion maps were calculated using RAPID software. CONTRAST:  100mL ISOVUE-370 IOPAMIDOL (ISOVUE-370) INJECTION 76% COMPARISON:  12/16/2018 CT head, CTA head, CTA  neck. FINDINGS: CTA NECK FINDINGS Aortic arch: Standard branching. Imaged portion shows no evidence of aneurysm or dissection. No significant stenosis of the major arch vessel origins. Right carotid system: No evidence of dissection, stenosis (50% or greater) or occlusion. Left carotid system: No evidence of dissection, stenosis (50% or greater) or occlusion. Non stenotic mixed plaque of carotid bifurcation. Vertebral arteries: Codominant. No evidence of dissection, stenosis (50% or greater) or occlusion. Skeleton: Cervical spondylosis with moderate discogenic degenerative changes from C4-C7. C3-4 and C7-T1 grade 1 anterolisthesis. Predominant left-sided upper cervical facet arthropathy. Other neck: Heterogeneous thyroid. Upper chest: Negative. Review of the MIP images confirms the above findings CTA HEAD FINDINGS Anterior circulation: No significant stenosis, proximal occlusion, aneurysm, or vascular malformation. Interval resolution of left M2 occlusion (series 13, image 31 compared with prior series 11, image 26). Mild atherosclerosis of carotid siphons with mild left paraclinoid ICA stenosis. Posterior circulation: No significant stenosis, proximal occlusion, aneurysm, or vascular malformation. Venous sinuses: As permitted by contrast timing, patent. Anatomic variants: Complete circle-of-Willis. Review of the MIP images confirms the above findings CT Brain  Perfusion Findings: CBF (<30%) Volume: 0mL Perfusion (Tmax>6.0s) volume: 9mL Mismatch Volume: 9mL Infarction Location:No core infarct by standard perfusion criteria. Small focus of oligemia in the left superolateral frontal lobe. IMPRESSION: 1. Interval resolution of left M2 occlusion. No new intracranial large vessel occlusion. 2. CT perfusion demonstrates no core infarct by standard perfusion criteria. 3. CT perfusion demonstrates a 9 cc focus of oligemia in the left superolateral frontal lobe which may reflect sequelae of recent ischemia from M2 occlusion or possibly a persistent small branch occlusion. 4. Patent carotid and vertebral arteries. No dissection, aneurysm, or hemodynamically significant stenosis utilizing NASCET criteria. These results were called by telephone at the time of interpretation on 12/16/2018 at 4:08 pm to Dr. Otelia LimesLindzen , who verbally acknowledged these results. Electronically Signed   By: Mitzi HansenLance  Furusawa-Stratton M.D.   On: 12/16/2018 16:21   Ct Head Code Stroke Wo Contrast  Result Date: 12/16/2018 CLINICAL DATA:  Code stroke. Speech difficulty beginning at 9:30 a.m. EXAM: CT HEAD WITHOUT CONTRAST TECHNIQUE: Contiguous axial images were obtained from the base of the skull through the vertex without intravenous contrast. COMPARISON:  None. FINDINGS: Brain: No evidence of acute infarction, hemorrhage, hydrocephalus, extra-axial collection or mass lesion/mass effect. Mild for age cerebral volume loss and chronic small-vessel ischemia in the cerebral white matter. Vascular: No hyperdense vessel or unexpected calcification. Skull: Normal. Negative for fracture or focal lesion. Sinuses/Orbits: Bilateral cataract resection Other: These results were called by telephone at the time of interpretation on 12/16/2018 at 11:14 am to Dr. Sharman CheekPHILLIP STAFFORD , who verbally acknowledged these results. ASPECTS [facility]([place] Stroke Program Early CT Score) - Ganglionic level infarction (caudate, lentiform nuclei,  internal capsule, insula, M1-M3 cortex): 7 - Supraganglionic infarction (M4-M6 cortex): 3 Total score (0-10 with 10 being normal): 10 IMPRESSION: Negative for hemorrhage or visible acute infarct. Electronically Signed   By: Marnee SpringJonathon  Watts M.D.   On: 12/16/2018 11:15    Assessment and Plan:   1. A fib new found with presentation of CVA.  CHA2DS2VASC score of 6,  [facility] hours [facility] post tPA infusion given at 1500 12/16/18.  And no hemorrhage on MRI of head.  No need for ASA per neuro  -hx of cardiac cath with normal coronary arteries, + spasm of Aorta per pt  No chest pain, no awareness of atrial fib.   Dr. Elease HashimotoNahser to see.  Most likely rate control and anticoagualtion, for 3-4 weeks the DCCV.  Will need anticoagulation.  2.   CVA with L M2 occlusion and tPA given yesterday afternoon with second episode of aphasia.  Symptoms improved still with difficulty with word retrieval.    3.   HTN  127/80 to 106/66 current meds none   Had been on amlodipine, HCTZ and losartan  Monitor BP for now.  meds depending on echo as well.    4.   CAD with hx of cardiac cath per pt and no angioplasty or stent  Troponin now neg   5.   HLD LDL 97, T chol 164, HDL 56 on lipitor       For questions or updates, please contact [facility] Please consult [email or web address] for contact info under     Signed, Nada Boozer, NP  12/17/2018 1:57 PM  Attending Note:   The patient was seen and examined.  Agree with assessment and plan as noted above.  Changes made to the above note as needed.  Patient seen and independently examined with  Nada Boozer, NP .   We discussed all aspects of the encounter. I agree with the assessment and plan as stated above.  1.   Atrial fib:   New dx of Afib.   Now presents with a CVA . Her HR is well controlled.    Will need to start Eliquis .  We discussed the relatively noncritical nature of atrial fibrillation.  We did discuss the absolute need for  anticoagulation. Getting echo .  Has a cruise set up for the end of February.  I think will be just fine for her to go on her cruise without any limitations.  2.  Hypertension: Pressure is fairly well controlled.  I suspect that she is eating less salt while in the hospital.      I have spent a total of 40 minutes with patient reviewing hospital  notes , telemetry, EKGs, labs and examining patient as well as establishing an assessment and plan that was discussed with the patient. > 50% of time was spent in direct patient care.    Vesta Mixer, Montez Hageman., MD, [facility] 12/17/2018, 4:07 PM [address],  [address] Office [phone] Pager [phone]

## 2018-12-17 NOTE — Evaluation (Signed)
Physical Therapy Evaluation Patient Details Name: Emma Rivera MRN: [id number] DOB: November 08, 1941 Today's Date: 12/17/2018   History of Present Illness  Emma Rivera is a 78 y.o. female with history of prediabetes, hypertension, heart murmur, esophageal spasms and hard of hearing.  Patient transferred from [facility] due to aphasia. CTA revealed L M2 occulsion. tPA given.  Clinical Impression  Pt admitted with above. Pt with mild word finding difficulties otherwise functioning near baseline. PT to assess higher level balance and stair negotiation next session and anticipate patient to progress quickly and not need further PT s/p discharge, when deemed medical stable.    Follow Up Recommendations No PT follow up;Supervision - Intermittent    Equipment Recommendations  None recommended by PT    Recommendations for Other Services       Precautions / Restrictions Precautions Precautions: Fall Precaution Comments: received tPA Restrictions Weight Bearing Restrictions: No      Mobility  Bed Mobility Overal bed mobility: Modified Independent             General bed mobility comments: HOB flat, no physical assist, no dizziness  Transfers Overall transfer level: Modified independent Equipment used: None             General transfer comment: no difficulty  Ambulation/Gait Ambulation/Gait assistance: Min guard Gait Distance (Feet): 300 Feet Assistive device: None Gait Pattern/deviations: WFL(Within Functional Limits) Gait velocity: wfl Gait velocity interpretation: >2.62 ft/sec, indicative of community ambulatory General Gait Details: pt initially guarded with decreased pace however transitioned to normal pace and appropriate gait pattern, no episodes of LOB  Stairs            Wheelchair Mobility    Modified Rankin (Stroke Patients Only) Modified Rankin (Stroke Patients Only) Pre-Morbid Rankin Score: No symptoms Modified Rankin: No significant  disability     Balance Overall balance assessment: Mild deficits observed, not formally tested                                           Pertinent Vitals/Pain Pain Assessment: No/denies pain    Home Living Family/patient expects to be discharged to:: Private residence Living Arrangements: Spouse/significant other Available Help at Discharge: Family;Available 24 hours/day Type of Home: House Home Access: Level entry     Home Layout: Multi-level Home Equipment: None      Prior Function Level of Independence: Independent         Comments: pt very active, does meals on wheels, red hatter, involved with church     Hand Dominance   Dominant Hand: Right    Extremity/Trunk Assessment   Upper Extremity Assessment Upper Extremity Assessment: Overall WFL for tasks assessed    Lower Extremity Assessment Lower Extremity Assessment: Overall WFL for tasks assessed    Cervical / Trunk Assessment Cervical / Trunk Assessment: Normal  Communication   Communication: Expressive difficulties(mild word finding difficulties)  Cognition Arousal/Alertness: Awake/alert Behavior During Therapy: WFL for tasks assessed/performed Overall Cognitive Status: Within Functional Limits for tasks assessed                                        General Comments General comments (skin integrity, edema, etc.): Pt's HR with sporadic jump into 140s however pt with AFIB, pt assisted to bathroom, pt tolieted and washed hands  with supervision    Exercises     Assessment/Plan    PT Assessment Patient needs continued PT services  PT Problem List Decreased activity tolerance       PT Treatment Interventions Gait training;Stair training;Functional mobility training;Therapeutic activities;Therapeutic exercise;Balance training;Neuromuscular re-education;Cognitive remediation    PT Goals (Current goals can be found in the Care Plan section)  Acute Rehab PT  Goals Patient Stated Goal: go home PT Goal Formulation: With patient Time For Goal Achievement: 12/31/18 Potential to Achieve Goals: Good    Frequency Min 4X/week   Barriers to discharge        Co-evaluation               AM-PAC PT "6 Clicks" Mobility  Outcome Measure Help needed turning from your back to your side while in a flat bed without using bedrails?: None Help needed moving from lying on your back to sitting on the side of a flat bed without using bedrails?: None Help needed moving to and from a bed to a chair (including a wheelchair)?: A Little Help needed standing up from a chair using your arms (e.g., wheelchair or bedside chair)?: A Little Help needed to walk in hospital room?: A Little Help needed climbing 3-5 steps with a railing? : A Little 6 Click Score: 20    End of Session Equipment Utilized During Treatment: Gait belt Activity Tolerance: Patient tolerated treatment well Patient left: in chair;with call bell/phone within reach;with family/visitor present Nurse Communication: Need for lift equipment PT Visit Diagnosis: Unsteadiness on feet (R26.81)    Time: 3790-2409 PT Time Calculation (min) (ACUTE ONLY): 25 min   Charges:   PT Evaluation $PT Eval Low Complexity: 1 Low PT Treatments $Gait Training: 8-22 mins        Lewis Shock, PT, DPT Acute Rehabilitation Services Pager #: [phone] Office #: [phone]   Iona Hansen 12/17/2018, 2:10 PM

## 2018-12-17 NOTE — Progress Notes (Addendum)
STROKE TEAM PROGRESS NOTE   INTERVAL HISTORY Her husband and 2 dtrs are at the bedside.  She is lying in the bed. Pt with new AF, felt to be etiology of stroke. Cardiology consulted to manage. Hold cardiac meds for now until they see. ST to assess language, passed swallow. Plan transfer to floor later today it diet controlled.  Vitals:   12/17/18 0500 12/17/18 0600 12/17/18 0700 12/17/18 0800  BP: 109/78 114/78 116/65 90/76  Pulse: (!) 102 64 94 (!) 41  Resp: 18 (!) 9 19 15   Temp:    98.1 F (36.7 C)  TempSrc:    Oral  SpO2: 94% 97% 93% 95%  Weight:      Height:        CBC:  Recent Labs  Lab 12/16/18 1058  WBC 9.3  NEUTROABS 7.0  HGB 14.2  HCT 43.3  MCV 97.5  PLT 215    Basic Metabolic Panel:  Recent Labs  Lab 12/16/18 1058  NA 138  K 4.0  CL 103  CO2 27  GLUCOSE 104*  BUN 19  CREATININE 1.06*  CALCIUM 9.3   Lipid Panel:     Component Value Date/Time   CHOL 164 12/17/2018 0507   TRIG 54 12/17/2018 0507   HDL 56 12/17/2018 0507   CHOLHDL 2.9 12/17/2018 0507   VLDL 11 12/17/2018 0507   LDLCALC 97 12/17/2018 0507   HgbA1c:  Lab Results  Component Value Date   HGBA1C 5.7 (H) 12/17/2018   Urine Drug Screen: No results found for: LABOPIA, COCAINSCRNUR, LABBENZ, AMPHETMU, THCU, LABBARB  Alcohol Level No results found for: ETH  IMAGING Ct Angio Head W Or Wo Contrast  Result Date: 12/16/2018 CLINICAL DATA:  78 y/o  F; evaluation of stroke. EXAM: CT ANGIOGRAPHY HEAD AND NECK CT PERFUSION BRAIN TECHNIQUE: Multidetector CT imaging of the head and neck was performed using the standard protocol during bolus administration of intravenous contrast. Multiplanar CT image reconstructions and MIPs were obtained to evaluate the vascular anatomy. Carotid stenosis measurements (when applicable) are obtained utilizing NASCET criteria, using the distal internal carotid diameter as the denominator. Multiphase CT imaging of the brain was performed following IV bolus contrast  injection. Subsequent parametric perfusion maps were calculated using RAPID software. CONTRAST:  100mL ISOVUE-370 IOPAMIDOL (ISOVUE-370) INJECTION 76% COMPARISON:  12/16/2018 CT head, CTA head, CTA neck. FINDINGS: CTA NECK FINDINGS Aortic arch: Standard branching. Imaged portion shows no evidence of aneurysm or dissection. No significant stenosis of the major arch vessel origins. Right carotid system: No evidence of dissection, stenosis (50% or greater) or occlusion. Left carotid system: No evidence of dissection, stenosis (50% or greater) or occlusion. Non stenotic mixed plaque of carotid bifurcation. Vertebral arteries: Codominant. No evidence of dissection, stenosis (50% or greater) or occlusion. Skeleton: Cervical spondylosis with moderate discogenic degenerative changes from C4-C7. C3-4 and C7-T1 grade 1 anterolisthesis. Predominant left-sided upper cervical facet arthropathy. Other neck: Heterogeneous thyroid. Upper chest: Negative. Review of the MIP images confirms the above findings CTA HEAD FINDINGS Anterior circulation: No significant stenosis, proximal occlusion, aneurysm, or vascular malformation. Interval resolution of left M2 occlusion (series 13, image 31 compared with prior series 11, image 26). Mild atherosclerosis of carotid siphons with mild left paraclinoid ICA stenosis. Posterior circulation: No significant stenosis, proximal occlusion, aneurysm, or vascular malformation. Venous sinuses: As permitted by contrast timing, patent. Anatomic variants: Complete circle-of-Willis. Review of the MIP images confirms the above findings CT Brain Perfusion Findings: CBF (<30%) Volume: 0mL Perfusion (Tmax>6.0s) volume: 9mL  Mismatch Volume: 9mL Infarction Location:No core infarct by standard perfusion criteria. Small focus of oligemia in the left superolateral frontal lobe. IMPRESSION: 1. Interval resolution of left M2 occlusion. No new intracranial large vessel occlusion. 2. CT perfusion demonstrates no core  infarct by standard perfusion criteria. 3. CT perfusion demonstrates a 9 cc focus of oligemia in the left superolateral frontal lobe which may reflect sequelae of recent ischemia from M2 occlusion or possibly a persistent small branch occlusion. 4. Patent carotid and vertebral arteries. No dissection, aneurysm, or hemodynamically significant stenosis utilizing NASCET criteria. These results were called by telephone at the time of interpretation on 12/16/2018 at 4:08 pm to Dr. Otelia Limes , who verbally acknowledged these results. Electronically Signed   By: Mitzi Hansen M.D.   On: 12/16/2018 16:21   Ct Angio Head W Or Wo Contrast  Result Date: 12/16/2018 CLINICAL DATA:  Acute aphasia, now resolved EXAM: CT ANGIOGRAPHY HEAD AND NECK TECHNIQUE: Multidetector CT imaging of the head and neck was performed using the standard protocol during bolus administration of intravenous contrast. Multiplanar CT image reconstructions and MIPs were obtained to evaluate the vascular anatomy. Carotid stenosis measurements (when applicable) are obtained utilizing NASCET criteria, using the distal internal carotid diameter as the denominator. CONTRAST:  75mL OMNIPAQUE IOHEXOL 350 MG/ML SOLN COMPARISON:  Noncontrast head CT earlier today FINDINGS: CTA NECK FINDINGS Aortic arch: Unremarkable or covered Right carotid system: Vessels are smooth and widely patent. Probable mild atheromatous thickening of the common carotid wall. Left carotid system: Vessels are smooth and sufficiently patent. Low-density plaque at the ICA bulb. Atheromatous wall thickening of the common carotid. Vertebral arteries: Proximal subclavian atheromatous wall thickening on the left. The vertebrals are widely patent. Skeleton: Cervical spine degeneration with C3-4 anterolisthesis. Other neck: Heterogeneous thyroid, more so on the right, without discrete nodule. Upper chest: Patchy air trapping. Review of the MIP images confirms the above findings CTA HEAD  FINDINGS Anterior circulation: Left M2 branch cut off with downstream variable reconstitution of the 2 attenuated downstream vessels. There is history of new onset atrial fibrillation. No additional embolism is seen. Overall mild atheromatous changes, most notably a mild stenosis of the left ICA at the clinoid segment Posterior circulation: The vertebrobasilar arteries are smooth and widely patent. No branch occlusion or flow limiting stenosis Venous sinuses: Patent Anatomic variants: None significant Delayed phase: Not obtained These results were called by telephone at the time of interpretation on 12/16/2018 at 11:55 am to Dr. Sharman Cheek , who verbally acknowledged these results. Review of the MIP images confirms the above findings IMPRESSION: 1. Emergent left M2 occlusion/embolism with partial reconstitution of downstream vessels. 2. Mild atherosclerosis without flow limiting stenosis or embolic source seen in the neck. Electronically Signed   By: Marnee Spring M.D.   On: 12/16/2018 12:01   Ct Angio Neck W Or Wo Contrast  Result Date: 12/16/2018 CLINICAL DATA:  78 y/o  F; evaluation of stroke. EXAM: CT ANGIOGRAPHY HEAD AND NECK CT PERFUSION BRAIN TECHNIQUE: Multidetector CT imaging of the head and neck was performed using the standard protocol during bolus administration of intravenous contrast. Multiplanar CT image reconstructions and MIPs were obtained to evaluate the vascular anatomy. Carotid stenosis measurements (when applicable) are obtained utilizing NASCET criteria, using the distal internal carotid diameter as the denominator. Multiphase CT imaging of the brain was performed following IV bolus contrast injection. Subsequent parametric perfusion maps were calculated using RAPID software. CONTRAST:  ISOVUE-370 IOPAMIDOL (ISOVUE-370) INJECTION 76% COMPARISON:  12/16/2018 CT head,  CTA head, CTA neck. FINDINGS: CTA NECK FINDINGS Aortic arch: Standard branching. Imaged portion shows no evidence  of aneurysm or dissection. No significant stenosis of the major arch vessel origins. Right carotid system: No evidence of dissection, stenosis (50% or greater) or occlusion. Left carotid system: No evidence of dissection, stenosis (50% or greater) or occlusion. Non stenotic mixed plaque of carotid bifurcation. Vertebral arteries: Codominant. No evidence of dissection, stenosis (50% or greater) or occlusion. Skeleton: Cervical spondylosis with moderate discogenic degenerative changes from C4-C7. C3-4 and C7-T1 grade 1 anterolisthesis. Predominant left-sided upper cervical facet arthropathy. Other neck: Heterogeneous thyroid. Upper chest: Negative. Review of the MIP images confirms the above findings CTA HEAD FINDINGS Anterior circulation: No significant stenosis, proximal occlusion, aneurysm, or vascular malformation. Interval resolution of left M2 occlusion (series 13, image 31 compared with prior series 11, image 26). Mild atherosclerosis of carotid siphons with mild left paraclinoid ICA stenosis. Posterior circulation: No significant stenosis, proximal occlusion, aneurysm, or vascular malformation. Venous sinuses: As permitted by contrast timing, patent. Anatomic variants: Complete circle-of-Willis. Review of the MIP images confirms the above findings CT Brain Perfusion Findings: CBF (<30%) Volume: 0mL Perfusion (Tmax>6.0s) volume: 9mL Mismatch Volume: 9mL Infarction Location:No core infarct by standard perfusion criteria. Small focus of oligemia in the left superolateral frontal lobe. IMPRESSION: 1. Interval resolution of left M2 occlusion. No new intracranial large vessel occlusion. 2. CT perfusion demonstrates no core infarct by standard perfusion criteria. 3. CT perfusion demonstrates a 9 cc focus of oligemia in the left superolateral frontal lobe which may reflect sequelae of recent ischemia from M2 occlusion or possibly a persistent small branch occlusion. 4. Patent carotid and vertebral arteries. No  dissection, aneurysm, or hemodynamically significant stenosis utilizing NASCET criteria. These results were called by telephone at the time of interpretation on 12/16/2018 at 4:08 pm to Dr. Otelia Limes , who verbally acknowledged these results. Electronically Signed   By: Mitzi Hansen M.D.   On: 12/16/2018 16:21   Ct Angio Neck W And/or Wo Contrast  Result Date: 12/16/2018 CLINICAL DATA:  Acute aphasia, now resolved EXAM: CT ANGIOGRAPHY HEAD AND NECK TECHNIQUE: Multidetector CT imaging of the head and neck was performed using the standard protocol during bolus administration of intravenous contrast. Multiplanar CT image reconstructions and MIPs were obtained to evaluate the vascular anatomy. Carotid stenosis measurements (when applicable) are obtained utilizing NASCET criteria, using the distal internal carotid diameter as the denominator. CONTRAST:  75mL OMNIPAQUE IOHEXOL 350 MG/ML SOLN COMPARISON:  Noncontrast head CT earlier today FINDINGS: CTA NECK FINDINGS Aortic arch: Unremarkable or covered Right carotid system: Vessels are smooth and widely patent. Probable mild atheromatous thickening of the common carotid wall. Left carotid system: Vessels are smooth and sufficiently patent. Low-density plaque at the ICA bulb. Atheromatous wall thickening of the common carotid. Vertebral arteries: Proximal subclavian atheromatous wall thickening on the left. The vertebrals are widely patent. Skeleton: Cervical spine degeneration with C3-4 anterolisthesis. Other neck: Heterogeneous thyroid, more so on the right, without discrete nodule. Upper chest: Patchy air trapping. Review of the MIP images confirms the above findings CTA HEAD FINDINGS Anterior circulation: Left M2 branch cut off with downstream variable reconstitution of the 2 attenuated downstream vessels. There is history of new onset atrial fibrillation. No additional embolism is seen. Overall mild atheromatous changes, most notably a mild stenosis of the  left ICA at the clinoid segment Posterior circulation: The vertebrobasilar arteries are smooth and widely patent. No branch occlusion or flow limiting stenosis Venous sinuses: Patent Anatomic variants:  None significant Delayed phase: Not obtained These results were called by telephone at the time of interpretation on 12/16/2018 at 11:55 am to Dr. Sharman CheekPHILLIP STAFFORD , who verbally acknowledged these results. Review of the MIP images confirms the above findings IMPRESSION: 1. Emergent left M2 occlusion/embolism with partial reconstitution of downstream vessels. 2. Mild atherosclerosis without flow limiting stenosis or embolic source seen in the neck. Electronically Signed   By: Marnee SpringJonathon  Watts M.D.   On: 12/16/2018 12:01   Ct Cerebral Perfusion W Contrast  Result Date: 12/16/2018 CLINICAL DATA:  78 y/o  F; evaluation of stroke. EXAM: CT ANGIOGRAPHY HEAD AND NECK CT PERFUSION BRAIN TECHNIQUE: Multidetector CT imaging of the head and neck was performed using the standard protocol during bolus administration of intravenous contrast. Multiplanar CT image reconstructions and MIPs were obtained to evaluate the vascular anatomy. Carotid stenosis measurements (when applicable) are obtained utilizing NASCET criteria, using the distal internal carotid diameter as the denominator. Multiphase CT imaging of the brain was performed following IV bolus contrast injection. Subsequent parametric perfusion maps were calculated using RAPID software. CONTRAST:  100mL ISOVUE-370 IOPAMIDOL (ISOVUE-370) INJECTION 76% COMPARISON:  12/16/2018 CT head, CTA head, CTA neck. FINDINGS: CTA NECK FINDINGS Aortic arch: Standard branching. Imaged portion shows no evidence of aneurysm or dissection. No significant stenosis of the major arch vessel origins. Right carotid system: No evidence of dissection, stenosis (50% or greater) or occlusion. Left carotid system: No evidence of dissection, stenosis (50% or greater) or occlusion. Non stenotic mixed  plaque of carotid bifurcation. Vertebral arteries: Codominant. No evidence of dissection, stenosis (50% or greater) or occlusion. Skeleton: Cervical spondylosis with moderate discogenic degenerative changes from C4-C7. C3-4 and C7-T1 grade 1 anterolisthesis. Predominant left-sided upper cervical facet arthropathy. Other neck: Heterogeneous thyroid. Upper chest: Negative. Review of the MIP images confirms the above findings CTA HEAD FINDINGS Anterior circulation: No significant stenosis, proximal occlusion, aneurysm, or vascular malformation. Interval resolution of left M2 occlusion (series 13, image 31 compared with prior series 11, image 26). Mild atherosclerosis of carotid siphons with mild left paraclinoid ICA stenosis. Posterior circulation: No significant stenosis, proximal occlusion, aneurysm, or vascular malformation. Venous sinuses: As permitted by contrast timing, patent. Anatomic variants: Complete circle-of-Willis. Review of the MIP images confirms the above findings CT Brain Perfusion Findings: CBF (<30%) Volume: 0mL Perfusion (Tmax>6.0s) volume: 9mL Mismatch Volume: 9mL Infarction Location:No core infarct by standard perfusion criteria. Small focus of oligemia in the left superolateral frontal lobe. IMPRESSION: 1. Interval resolution of left M2 occlusion. No new intracranial large vessel occlusion. 2. CT perfusion demonstrates no core infarct by standard perfusion criteria. 3. CT perfusion demonstrates a 9 cc focus of oligemia in the left superolateral frontal lobe which may reflect sequelae of recent ischemia from M2 occlusion or possibly a persistent small branch occlusion. 4. Patent carotid and vertebral arteries. No dissection, aneurysm, or hemodynamically significant stenosis utilizing NASCET criteria. These results were called by telephone at the time of interpretation on 12/16/2018 at 4:08 pm to Dr. Otelia LimesLindzen , who verbally acknowledged these results. Electronically Signed   By: Mitzi HansenLance   Furusawa-Stratton M.D.   On: 12/16/2018 16:21   Ct Head Code Stroke Wo Contrast  Result Date: 12/16/2018 CLINICAL DATA:  Code stroke. Speech difficulty beginning at 9:30 a.m. EXAM: CT HEAD WITHOUT CONTRAST TECHNIQUE: Contiguous axial images were obtained from the base of the skull through the vertex without intravenous contrast. COMPARISON:  None. FINDINGS: Brain: No evidence of acute infarction, hemorrhage, hydrocephalus, extra-axial collection or mass lesion/mass effect. Mild  for age cerebral volume loss and chronic small-vessel ischemia in the cerebral white matter. Vascular: No hyperdense vessel or unexpected calcification. Skull: Normal. Negative for fracture or focal lesion. Sinuses/Orbits: Bilateral cataract resection Other: These results were called by telephone at the time of interpretation on 12/16/2018 at 11:14 am to Dr. Sharman Cheek , who verbally acknowledged these results. ASPECTS [facility] Stroke Program Early CT Score) - Ganglionic level infarction (caudate, lentiform nuclei, internal capsule, insula, M1-M3 cortex): 7 - Supraganglionic infarction (M4-M6 cortex): 3 Total score (0-10 with 10 being normal): 10 IMPRESSION: Negative for hemorrhage or visible acute infarct. Electronically Signed   By: Marnee Spring M.D.   On: 12/16/2018 11:15    PHYSICAL EXAM  Pleasant elderly Caucasian lady currently not in distress. . Afebrile. Head is nontraumatic. Neck is supple without bruit.    Cardiac exam no murmur or gallop. Lungs are clear to auscultation. Distal pulses are well felt. Neurological Exam ;  Awake  Alert oriented x 3. Normal speech and language visit on the minimum word hesitancy. Able to name and repeat quite well. Good comprehension.[place] movements full without nystagmus.fundi were not visualized. Vision acuity and fields appear normal. Hearing is normal. Palatal movements are normal. Face symmetric. Tongue midline. Normal strength, tone, reflexes and coordination. Normal sensation.  Gait deferred.   ASSESSMENT/PLAN Ms. KRYSSA GREENSTONE is a 79 y.o. female with history of prediabetes, hypertension, heart murmur, esophageal spasms and hard of hearing presenting to [facility] with aphasia, found to have a L M2 occlusion. Treated with tPA 12/16/2018 at 1500 and transferred to [facility]. Once at [facility], repeat imaging showed resolution of M2 clot.   Stroke:  left frontal infarct s/p IV tPA, infarct embolic secondary to new dx AF  Code Stroke CT head 2/3 1115 No acute stroke. ASPECTS 10.     CTA head & neck 2/3 1201 ELVO L M2 w/ partial reconstitution. Mild atherosclerosis   CTA head & neck, CT perfusion 2/3 1608 interval resolution L M2 occlusion. No core infarct. 9cc L frontal lobe ischemia.  MRI  Tiny punctate left insular cortex and LMCA branch infarcts  2D Echo  pending   LDL 97  HgbA1c 5.7  SCDs for VTE prophylaxis  Passed swallow, ok for diet  aspirin 81 mg daily prior to admission, now on No antithrombotic as within 24 h of tPA.  See [facility] recommendations below  Therapy recommendations:  Pending. Ok to be OOB  Disposition:  pending   Atrial Fibrillation, new dx  Home anticoagulation:  No antithrombotic continued in the hospital  CHA2DS2-VASc Score = 6, ?2 oral anticoagulation recommended  Age in Years:  ?78   +2    Sex:  Female   Female   +1    Hypertension History:  yes   +1     Diabetes Mellitus:  0  Congestive Heart Failure History:  0  Vascular Disease History:  0     Stroke/TIA/Thromboembolism History:  yes   +2 . Cardiology consult for new AF . Defer card meds to cardiology . Ok for [facility] once 24h post tPA completed if MRI without significant hmg. No indication for additional aspirin along with AC from the stroke standpoint  Hypertension  Stable . BP goal per post tPA guidelines x 24h  . Long-term BP goal normotensive  Hyperlipidemia  Home meds:  No statin  LDL 97, goal < 70  Add statin LIPITOR  20  Continue statin at discharge  Pre-Diabetes  HgbA1c  5.7, at goal <  7.0  Other Stroke Risk Factors  Advanced age  ETOH use, advised to drink no more than 1 drink(s) a day  Obesity, Body mass index is 33.19 kg/m., recommend weight loss, diet and exercise as appropriate   Other Active Problems  Normal memory changes, no noted cognitive decline PTA  Cr 1.06  Hospital day # 1  Annie Main, MSN, APRN, ANVP-BC, AGPCNP-BC Advanced Practice Stroke Nurse [facility] See Amion for Schedule & Pager information 12/17/2018 9:27 AM  I have personally obtained history,examined this patient, reviewed notes, independently viewed imaging studies, participated in medical decision making and plan of care.ROS completed by me personally and pertinent positives fully documented  I have made any additions or clarifications directly to the above note. Agree with note above.  She presented with fluctuating aphasia due to left M2 clot which resolved after IV tPA and is doing well. She has new onset atrial fibrillation. Recommend anticoagulation with eliquis and appreciate cardiology help for A. Fib management. Continue ongoing stroke workup and aggressive risk factor modification.Continue strict blood pressure control an neurological monitoring as per post TPA protocol. Long discussion with patient, husband, daughters and answered questions.This patient is critically ill and at significant risk of neurological worsening, death and care requires constant monitoring of vital signs, hemodynamics,respiratory and cardiac monitoring, extensive review of multiple databases, frequent neurological assessment, discussion with family, other specialists and medical decision making of high complexity.I have made any additions or clarifications directly to the above note.This critical care time does not reflect procedure time, or teaching time or supervisory time of PA/NP/Med Resident etc but could involve care discussion time.  I spent 30 minutes of neurocritical care  time  in the care of  this patient.      Delia Heady, MD Medical Director [facility] Stroke Center Pager: [phone] 12/17/2018 4:23 PM  To contact Stroke Continuity provider, please refer to [email or web address]. After hours, contact General Neurology

## 2018-12-17 NOTE — Discharge Instructions (Signed)

## 2018-12-17 NOTE — Progress Notes (Signed)
ANTICOAGULATION CONSULT NOTE - Initial Consult  Pharmacy Consult for Apixaban Indication: atrial fibrillation and stroke  Allergies  Allergen Reactions  . Sulfa Antibiotics Nausea Only    Patient Measurements: Height: 5\' 2"  (157.5 cm) Weight: 181 lb 7 oz (82.3 kg) IBW/kg (Calculated) : 50.1  Vital Signs: Temp: 98.8 F (37.1 C) (02/04 1200) Temp Source: Oral (02/04 1200) BP: 131/82 (02/04 1400) Pulse Rate: 110 (02/04 1400)  Labs: Recent Labs    12/16/18 1058 12/16/18 1135  HGB 14.2  --   HCT 43.3  --   PLT 215  --   APTT  --  27  LABPROT  --  13.7  INR  --  1.06  CREATININE 1.06*  --   TROPONINI <0.03  --     Estimated Creatinine Clearance: 44.9 mL/min (A) (by C-G formula based on SCr of 1.06 mg/dL (H)).   Medical History: Past Medical History:  Diagnosis Date  . Depression   . Esophageal spasm   . Esophageal spasm   . GERD (gastroesophageal reflux disease)   . Heart murmur   . HOH (hard of hearing)   . Hypertension   . Pre-diabetes     Assessment: 15 YOF who presented on 2/3 as a code stroke with MRI confirming new acute nonhemorrhagic infarctions in the L-MCA. The patient did receive IV tPA while at [facility] prior to transfer to [facility] around 1500. Work- up has now revealed, new onset Afib. A follow-up MRI this afternoon did not show hemorrhagic transformation and the stroke team has ordered the start of apixaban since now >24h out for tPA. Pharmacy has been consulted to dose.   Given age<80, wt>60 kg, SCr<1.5 - will initiate with 5 mg bid dosing.  Goal of Therapy:  Appropriate anticoagulation for indication and hepatic/renal function    Plan:  - Start Apixaban 5 mg bid - Pharmacy will plan to educate prior to discharge - Will sign off of consult but continue to monitor peripherally for signs/symptoms of bleeding and any necessary dose adjustments.  Thank you for allowing pharmacy to be a part of this patient's care.  Georgina Pillion, PharmD,  BCPS Clinical Pharmacist Clinical phone for 12/17/2018: [phone] 12/17/2018 3:43 PM   **Pharmacist phone directory can now be found on [email or web address] ([email or web address]).  Listed under [facility].

## 2018-12-17 NOTE — Evaluation (Signed)
Occupational Therapy Evaluation Patient Details Name: Emma Rivera MRN: [id number] DOB: 03-27-1941 Today's Date: 12/17/2018    History of Present Illness Emma Rivera is a 78 y.o. female with history of prediabetes, hypertension, heart murmur, esophageal spasms and hard of hearing.  Patient transferred from [facility] due to aphasia. CTA revealed L M2 occulsion. tPA given.   Clinical Impression   PTA patient independent and driving.  Admitted for above and limited by problem list below, including decreased short term memory.  Patient functioning at baseline independent level for self care, transfers and mobility.  Completed cognitive short blessed assessment with patient, revealing short term memory deficits and patient educated on compensatory techniques to maximize independence and safety.  Anticipate she will progress well, will follow while admitted but anticipate no needs after dc.     Follow Up Recommendations  Supervision/Assistance - 24 hour;No OT follow up    Equipment Recommendations  None recommended by OT    Recommendations for Other Services       Precautions / Restrictions Precautions Precautions: Fall Precaution Comments: received tPA Restrictions Weight Bearing Restrictions: No      Mobility Bed Mobility Overal bed mobility: Modified Independent             General bed mobility comments: HOB flat, no physical assist returning to supine   Transfers Overall transfer level: Modified independent Equipment used: None             General transfer comment: no difficulty    Balance Overall balance assessment: Mild deficits observed, not formally tested                                         ADL either performed or assessed with clinical judgement   ADL Overall ADL's : Modified independent;At baseline                                       General ADL Comments: modified independent level for bathing, dressing,  grooming and toilet transfers      Vision Baseline Vision/History: Wears glasses(with prisms (hx double vision)) Wears Glasses: At all times Patient Visual Report: No change from baseline Vision Assessment?: No apparent visual deficits     Perception     Praxis      Pertinent Vitals/Pain Pain Assessment: No/denies pain     Hand Dominance Right   Extremity/Trunk Assessment Upper Extremity Assessment Upper Extremity Assessment: Overall WFL for tasks assessed   Lower Extremity Assessment Lower Extremity Assessment: Defer to PT evaluation   Cervical / Trunk Assessment Cervical / Trunk Assessment: Normal   Communication Communication Communication: Expressive difficulties(mild word finding difficulties)   Cognition Arousal/Alertness: Awake/alert Behavior During Therapy: WFL for tasks assessed/performed Overall Cognitive Status: Impaired/Different from baseline Area of Impairment: Memory                     Memory: Decreased short-term memory         General Comments: Short blessed test completed: scoring 10/28 significant impairments in short term memory only    General Comments  HR increased to 120s during mobility, educated on BE-FAST and reviewed techniques to compensate for STM changes since admission     Exercises     Shoulder Instructions      Home Living Family/patient  expects to be discharged to:: Private residence Living Arrangements: Spouse/significant other Available Help at Discharge: Family;Available 24 hours/day Type of Home: House Home Access: Level entry     Home Layout: Multi-level Alternate Level Stairs-Number of Steps: 8 Alternate Level Stairs-Rails: Right Bathroom Shower/Tub: Chief Strategy Officer: Standard     Home Equipment: None          Prior Functioning/Environment Level of Independence: Independent        Comments: pt very active, does meals on wheels, red hatter, involved with church        OT  Problem List: Decreased activity tolerance;Decreased cognition      OT Treatment/Interventions: Cognitive remediation/compensation;Self-care/ADL training;Therapeutic activities    OT Goals(Current goals can be found in the care plan section) Acute Rehab OT Goals Patient Stated Goal: go home OT Goal Formulation: With patient Time For Goal Achievement: 12/31/18 Potential to Achieve Goals: Good  OT Frequency: Min 2X/week   Barriers to D/C:            Co-evaluation              AM-PAC OT "6 Clicks" Daily Activity     Outcome Measure Help from another person eating meals?: None Help from another person taking care of personal grooming?: None Help from another person toileting, which includes using toliet, bedpan, or urinal?: None Help from another person bathing (including washing, rinsing, drying)?: None Help from another person to put on and taking off regular upper body clothing?: None Help from another person to put on and taking off regular lower body clothing?: None 6 Click Score: 24   End of Session Nurse Communication: Mobility status;Other (comment)(short term memory)  Activity Tolerance: Patient tolerated treatment well Patient left: in bed;with call bell/phone within reach;with family/visitor present  OT Visit Diagnosis: Other symptoms and signs involving the nervous system (R29.898);Other symptoms and signs involving cognitive function                Time: 1354-1415 OT Time Calculation (min): 21 min Charges:  OT General Charges $OT Visit: 1 Visit OT Evaluation $OT Eval Moderate Complexity: 1 Mod  Chancy Milroy, OT Acute Rehabilitation Services Pager [phone] Office [phone]    Chancy Milroy 12/17/2018, 3:29 PM

## 2018-12-17 NOTE — Progress Notes (Signed)
PT Cancellation Note  Patient Details Name: MCCALL DEBERG MRN: [id number] DOB: [birth year]   Cancelled Treatment:    Reason Eval/Treat Not Completed: Patient at procedure or test/unavailable. Pt off floor at MRI. PT to return as able to complete PT eval.   Lewis Shock, PT, DPT Acute Rehabilitation Services Pager #: [phone] Office #: [phone]    Iona Hansen 12/17/2018, 11:17 AM

## 2018-12-18 ENCOUNTER — Encounter (HOSPITAL_COMMUNITY): Payer: Self-pay

## 2018-12-18 ENCOUNTER — Inpatient Hospital Stay (HOSPITAL_COMMUNITY): Payer: Medicare Other

## 2018-12-18 ENCOUNTER — Other Ambulatory Visit: Payer: Self-pay

## 2018-12-18 DIAGNOSIS — I4891 Unspecified atrial fibrillation: Secondary | ICD-10-CM

## 2018-12-18 DIAGNOSIS — I4819 Other persistent atrial fibrillation: Secondary | ICD-10-CM | POA: Diagnosis present

## 2018-12-18 DIAGNOSIS — E785 Hyperlipidemia, unspecified: Secondary | ICD-10-CM | POA: Diagnosis present

## 2018-12-18 DIAGNOSIS — R7303 Prediabetes: Secondary | ICD-10-CM | POA: Diagnosis present

## 2018-12-18 DIAGNOSIS — E669 Obesity, unspecified: Secondary | ICD-10-CM | POA: Diagnosis present

## 2018-12-18 DIAGNOSIS — I361 Nonrheumatic tricuspid (valve) insufficiency: Secondary | ICD-10-CM

## 2018-12-18 DIAGNOSIS — I10 Essential (primary) hypertension: Secondary | ICD-10-CM | POA: Diagnosis present

## 2018-12-18 LAB — ECHOCARDIOGRAM COMPLETE
Height: 62 in
Weight: 2948.87 oz

## 2018-12-18 MED ORDER — PANTOPRAZOLE SODIUM 40 MG PO TBEC: 40.0000 mg | DELAYED_RELEASE_TABLET | Freq: Every day | ORAL | Status: DC

## 2018-12-18 MED ORDER — METOPROLOL SUCCINATE ER 25 MG PO TB24: 25.0000 mg | ORAL_TABLET | Freq: Every day | ORAL | 2 refills | Status: DC

## 2018-12-18 MED ORDER — APIXABAN 5 MG PO TABS: 5.0000 mg | ORAL_TABLET | Freq: Two times a day (BID) | ORAL | 0 refills | Status: DC

## 2018-12-18 MED ORDER — ATORVASTATIN CALCIUM 20 MG PO TABS: 20.0000 mg | ORAL_TABLET | Freq: Every day | ORAL | 2 refills | Status: DC

## 2018-12-18 MED ORDER — METOPROLOL SUCCINATE ER 25 MG PO TB24
25.0000 mg | ORAL_TABLET | Freq: Every day | ORAL | Status: DC
  Administered 2018-12-18: 25 mg via ORAL
  Filled 2018-12-18: qty 1

## 2018-12-18 MED FILL — ELIQUIS 5 MG TABLET: 5 | 30 days supply | Qty: 60 | Fill #0

## 2018-12-18 NOTE — Progress Notes (Signed)
  Echocardiogram 2D Echocardiogram has been performed.  Karole Oo G Drew Herman 12/18/2018, 11:26 AM

## 2018-12-18 NOTE — Care Management Note (Signed)
Case Management Note  Patient Details  Name: Emma Rivera MRN: [id number] Date of Birth: [birth year]  Subjective/Objective:     Pt admitted with a stroke. She is from home with spouse.                Action/Plan: Pt discharging home with self care. Pt will be on Eliquis. [facility]OC pharmacy filled the medication and used the 30 day free card. Pt to f/u with her pharmacy on the cost post 30 days.  Spouse to provide transportation home.  Expected Discharge Date:  12/18/18               Expected Discharge Plan:  Home/Self Care  In-House Referral:     Discharge planning Services     Post Acute Care Choice:    Choice offered to:     DME Arranged:    DME Agency:     HH Arranged:    HH Agency:     Status of Service:  Completed, signed off  If discussed at [employer] of Stay Meetings, dates discussed:    Additional Comments:  Kermit Balo, RN 12/18/2018, 2:31 PM

## 2018-12-18 NOTE — Progress Notes (Signed)
Physical Therapy Treatment and Discharge Patient Details Name: Emma Rivera MRN: [id number] DOB: [birth year] Today's Date: 12/18/2018    History of Present Illness Emma Rivera is a 78 y.o. female with history of prediabetes, hypertension, heart murmur, esophageal spasms and hard of hearing.  Patient transferred from [facility] due to aphasia. CTA revealed L M2 occulsion. tPA given.    PT Comments    Pt progressing well towards goals. Performed gait and stair training at a supervision to independent level this session and reports she feels close to baseline with mobility. Scored 22 on DGI indicating low fall risk.  Educated about BE FAST acronym in recognizing CVA symptoms. No further acute PT needs at this time. Will d/c from acute PT services. If needs change, please re-consult.   Follow Up Recommendations  No PT follow up;Supervision - Intermittent     Equipment Recommendations  None recommended by PT    Recommendations for Other Services       Precautions / Restrictions Precautions Precautions: Fall Restrictions Weight Bearing Restrictions: No    Mobility  Bed Mobility Overal bed mobility: Independent                Transfers Overall transfer level: Independent Equipment used: None                Ambulation/Gait Ambulation/Gait assistance: Independent Gait Distance (Feet): 250 Feet Assistive device: None Gait Pattern/deviations: WFL(Within Functional Limits) Gait velocity: WFL    General Gait Details: Able to perform dynamic gait tasks of DGI without LOB this session. Reports feeling back to baseline with ambulation.    Stairs Stairs: Yes Stairs assistance: Supervision Stair Management: One rail Right;Alternating pattern;Forwards Number of Stairs: 4 General stair comments: Overall safe stair navigation. Supervision for safety. No LOB noted.    Wheelchair Mobility    Modified Rankin (Stroke Patients Only) Modified Rankin (Stroke Patients  Only) Pre-Morbid Rankin Score: No symptoms Modified Rankin: No significant disability     Balance Overall balance assessment: Independent                               Standardized Balance Assessment Standardized Balance Assessment : Dynamic Gait Index   Dynamic Gait Index Level Surface: Normal Change in Gait Speed: Normal Gait with Horizontal Head Turns: Normal Gait with Vertical Head Turns: Normal Gait and Pivot Turn: Normal Step Over Obstacle: Mild Impairment Step Around Obstacles: Normal Steps: Mild Impairment Total Score: 22      Cognition Arousal/Alertness: Awake/alert Behavior During Therapy: WFL for tasks assessed/performed Overall Cognitive Status: Within Functional Limits for tasks assessed                                        Exercises      General Comments General comments (skin integrity, edema, etc.): Reviewed BE FAST acronym with pt and pt's husband.       Pertinent Vitals/Pain Pain Assessment: No/denies pain    Home Living                      Prior Function            PT Goals (current goals can now be found in the care plan section) Acute Rehab PT Goals Patient Stated Goal: to go home today PT Goal Formulation: With patient Time For Goal Achievement:  12/31/18 Potential to Achieve Goals: Good Progress towards PT goals: Goals met/education completed, patient discharged from PT    Frequency    Min 4X/week      PT Plan Current plan remains appropriate    Co-evaluation              AM-PAC PT "6 Clicks" Mobility   Outcome Measure  Help needed turning from your back to your side while in a flat bed without using bedrails?: None Help needed moving from lying on your back to sitting on the side of a flat bed without using bedrails?: None Help needed moving to and from a bed to a chair (including a wheelchair)?: None Help needed standing up from a chair using your arms (e.g., wheelchair or  bedside chair)?: None Help needed to walk in hospital room?: None Help needed climbing 3-5 steps with a railing? : None 6 Click Score: 24    End of Session Equipment Utilized During Treatment: Gait belt Activity Tolerance: Patient tolerated treatment well Patient left: in chair;with call bell/phone within reach;with family/visitor present Nurse Communication: Mobility status PT Visit Diagnosis: Unsteadiness on feet (R26.81)     Time: 5183-4373 PT Time Calculation (min) (ACUTE ONLY): 16 min  Charges:  $Gait Training: 8-22 mins                     Leighton Ruff, PT, DPT  Acute Rehabilitation Services  Pager: [phone] Office: [phone]    Rudean Hitt 12/18/2018, 12:30 PM

## 2018-12-18 NOTE — Progress Notes (Signed)
Pt d/c, instructions given, all pt belongings left with pt, IV access removed. Pt left unit with volunteer services and spouse. Emma Rivera

## 2018-12-18 NOTE — Progress Notes (Signed)
ANTICOAGULATION CONSULT NOTE  Pharmacy Consult for Apixaban Indication: atrial fibrillation and stroke  Allergies  Allergen Reactions  . 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 The patient did receive IV tPA while at [facility] prior to transfer to [facility] around 1500. Work- up has now revealed, new onset Afib. A follow-up MRI this afternoon did not show hemorrhagic transformation and the stroke team has ordered the start of apixaban since now >24h out for tPA. Pharmacy has been consulted to dose.   Given age<80, wt>60 kg, SCr<1.5 - will initiate with 5 mg bid dosing.  Goal of Therapy:  Appropriate anticoagulation for indication and hepatic/renal function    Plan:  Eliquis 5 mg po BID Education complete  Delivered 30 day supply of medication to room using Eliquis free 30 day card   Thank you for allowing pharmacy to be a part of this patient's care. Okey Regal, PharmD [phone]  12/18/2018 10:01 AM   **Pharmacist phone directory can now be found on [email or web address] ([email or web address]).  Listed under [facility].

## 2018-12-18 NOTE — Discharge Summary (Addendum)
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 Depression   . Esophageal spasm   . Esophageal spasm   . GERD (gastroesophageal reflux disease)   . Heart murmur   . HOH (hard of hearing)   . Hypertension   . Pre-diabetes    Past Surgical History:  Procedure Laterality Date  . CARDIAC CATHETERIZATION    . CATARACT EXTRACTION W/PHACO Left 09/09/2015   Procedure: CATARACT EXTRACTION PHACO AND INTRAOCULAR LENS PLACEMENT (IOC);  Surgeon: Lia Hopping, MD;  Location: [facility] ORS;  Service: Ophthalmology;  Laterality: Left;  [place]   1:10.5 AP     8.6 CDE  6.05 casette lot #  [id number]  . CATARACT EXTRACTION W/PHACO Right 09/30/2015   Procedure: CATARACT EXTRACTION PHACO AND INTRAOCULAR LENS PLACEMENT (IOC);  Surgeon: Lia Hopping, MD;  Location: [facility] ORS;  Service: Ophthalmology;  Laterality: Right;  [place] 01:00.3 AP%: 11.6 CDE: 7.00 FLUID LOT# [id number]  . COLONOSCOPY    . COLONOSCOPY WITH PROPOFOL N/A 04/04/2017   Procedure: COLONOSCOPY WITH PROPOFOL;  Surgeon: Scot Jun, MD;  Location: [facility] ENDOSCOPY;  Service: Endoscopy;  Laterality: N/A;  . TONSILLECTOMY      Allergies as of 12/18/2018      Reactions   Sulfa Antibiotics Nausea Only      Medication List    STOP taking these medications   amLODipine 5 MG tablet Commonly known as:  NORVASC   aspirin 81 MG tablet   hydrochlorothiazide 25 MG tablet Commonly known as:  HYDRODIURIL      TAKE these medications   apixaban 5 MG Tabs tablet Commonly known as:  ELIQUIS Take 1 tablet (5 mg total) by mouth 2 (two) times daily.   atorvastatin 20 MG tablet Commonly known as:  LIPITOR Take 1 tablet (20 mg total) by mouth daily at 6 PM.   citalopram 20 MG tablet Commonly known as:  CELEXA Take 20 mg by mouth daily.   losartan 100 MG tablet Commonly known as:  COZAAR Take 100 mg by mouth daily.   metoprolol succinate 25 MG 24 hr tablet Commonly known as:  TOPROL-XL Take 1 tablet (25 mg total) by mouth daily. Start taking on:  December 19, 2018   multivitamin tablet Take 1 tablet by mouth daily.   omeprazole 20 MG capsule Commonly known as:  PRILOSEC Take 20 mg by mouth daily.   tolterodine 2 MG 24 hr capsule Commonly known as:  DETROL LA Take 2 mg by mouth 2 (two) times daily.       LABORATORY STUDIES CBC    Component Value Date/Time   WBC 9.3 12/16/2018 1058   RBC 4.44 12/16/2018 1058   HGB 14.2 12/16/2018 1058   HCT 43.3 12/16/2018 1058   PLT 215 12/16/2018 1058   MCV 97.5 12/16/2018 1058   MCH 32.0 12/16/2018 1058   MCHC 32.8 12/16/2018 1058   RDW 13.1 12/16/2018 1058  LYMPHSABS 1.1 12/16/2018 1058   MONOABS 1.1 (H) 12/16/2018 1058   EOSABS 0.1 12/16/2018 1058   BASOSABS 0.0 12/16/2018 1058   CMP    Component Value Date/Time   NA 138 12/16/2018 1058   K 4.0 12/16/2018 1058   CL 103 12/16/2018 1058   CO2 27 12/16/2018 1058   GLUCOSE 104 (H) 12/16/2018 1058   BUN 19 12/16/2018 1058   CREATININE 1.06 (H) 12/16/2018 1058   CALCIUM 9.3 12/16/2018 1058   PROT 7.3 12/16/2018 1058   ALBUMIN 3.9 12/16/2018 1058   AST 26 12/16/2018 1058   ALT 22 12/16/2018 1058   ALKPHOS 49 12/16/2018 1058   BILITOT 1.3 (H) 12/16/2018 1058   GFRNONAA 51 (L) 12/16/2018 1058   GFRAA 59 (L) 12/16/2018 1058   COAGS Lab Results  Component Value Date   INR 1.06 12/16/2018   Lipid Panel    Component Value Date/Time   CHOL 164 12/17/2018 0507   TRIG 54  12/17/2018 0507   HDL 56 12/17/2018 0507   CHOLHDL 2.9 12/17/2018 0507   VLDL 11 12/17/2018 0507   LDLCALC 97 12/17/2018 0507   HgbA1C  Lab Results  Component Value Date   HGBA1C 5.7 (H) 12/17/2018    SIGNIFICANT DIAGNOSTIC STUDIES Ct Angio Head W Or Wo Contrast  Result Date: 12/16/2018 CLINICAL DATA:  78 y/o  F; evaluation of stroke. EXAM: CT ANGIOGRAPHY HEAD AND NECK CT PERFUSION BRAIN TECHNIQUE: Multidetector CT imaging of the head and neck was performed using the standard protocol during bolus administration of intravenous contrast. Multiplanar CT image reconstructions and MIPs were obtained to evaluate the vascular anatomy. Carotid stenosis measurements (when applicable) are obtained utilizing NASCET criteria, using the distal internal carotid diameter as the denominator. Multiphase CT imaging of the brain was performed following IV bolus contrast injection. Subsequent parametric perfusion maps were calculated using RAPID software. CONTRAST:  100mL ISOVUE-370 IOPAMIDOL (ISOVUE-370) INJECTION 76% COMPARISON:  12/16/2018 CT head, CTA head, CTA neck. FINDINGS: CTA NECK FINDINGS Aortic arch: Standard branching. Imaged portion shows no evidence of aneurysm or dissection. No significant stenosis of the major arch vessel origins. Right carotid system: No evidence of dissection, stenosis (50% or greater) or occlusion. Left carotid system: No evidence of dissection, stenosis (50% or greater) or occlusion. Non stenotic mixed plaque of carotid bifurcation. Vertebral arteries: Codominant. No evidence of dissection, stenosis (50% or greater) or occlusion. Skeleton: Cervical spondylosis with moderate discogenic degenerative changes from C4-C7. C3-4 and C7-T1 grade 1 anterolisthesis. Predominant left-sided upper cervical facet arthropathy. Other neck: Heterogeneous thyroid. Upper chest: Negative. Review of the MIP images confirms the above findings CTA HEAD FINDINGS Anterior circulation: No significant stenosis,  proximal occlusion, aneurysm, or vascular malformation. Interval resolution of left M2 occlusion (series 13, image 31 compared with prior series 11, image 26). Mild atherosclerosis of carotid siphons with mild left paraclinoid ICA stenosis. Posterior circulation: No significant stenosis, proximal occlusion, aneurysm, or vascular malformation. Venous sinuses: As permitted by contrast timing, patent. Anatomic variants: Complete circle-of-Willis. Review of the MIP images confirms the above findings CT Brain Perfusion Findings: CBF (<30%) Volume: 0mL Perfusion (Tmax>6.0s) volume: 9mL Mismatch Volume: 9mL Infarction Location:No core infarct by standard perfusion criteria. Small focus of oligemia in the left superolateral frontal lobe. IMPRESSION: 1. Interval resolution of left M2 occlusion. No new intracranial large vessel occlusion. 2. CT perfusion demonstrates no core infarct by standard perfusion criteria. 3. CT perfusion demonstrates a 9 cc focus of oligemia in the left superolateral frontal lobe which may reflect  sequelae of recent ischemia from M2 occlusion or possibly a persistent small branch occlusion. 4. Patent carotid and vertebral arteries. No dissection, aneurysm, or hemodynamically significant stenosis utilizing NASCET criteria. These results were called by telephone at the time of interpretation on 12/16/2018 at 4:08 pm to Dr. Otelia LimesLindzen , who verbally acknowledged these results. Electronically Signed   By: Mitzi HansenLance  Furusawa-Stratton M.D.   On: 12/16/2018 16:21   Ct Angio Head W Or Wo Contrast  Result Date: 12/16/2018 CLINICAL DATA:  Acute aphasia, now resolved EXAM: CT ANGIOGRAPHY HEAD AND NECK TECHNIQUE: Multidetector CT imaging of the head and neck was performed using the standard protocol during bolus administration of intravenous contrast. Multiplanar CT image reconstructions and MIPs were obtained to evaluate the vascular anatomy. Carotid stenosis measurements (when applicable) are obtained utilizing  NASCET criteria, using the distal internal carotid diameter as the denominator. CONTRAST:  75mL OMNIPAQUE IOHEXOL 350 MG/ML SOLN COMPARISON:  Noncontrast head CT earlier today FINDINGS: CTA NECK FINDINGS Aortic arch: Unremarkable or covered Right carotid system: Vessels are smooth and widely patent. Probable mild atheromatous thickening of the common carotid wall. Left carotid system: Vessels are smooth and sufficiently patent. Low-density plaque at the ICA bulb. Atheromatous wall thickening of the common carotid. Vertebral arteries: Proximal subclavian atheromatous wall thickening on the left. The vertebrals are widely patent. Skeleton: Cervical spine degeneration with C3-4 anterolisthesis. Other neck: Heterogeneous thyroid, more so on the right, without discrete nodule. Upper chest: Patchy air trapping. Review of the MIP images confirms the above findings CTA HEAD FINDINGS Anterior circulation: Left M2 branch cut off with downstream variable reconstitution of the 2 attenuated downstream vessels. There is history of new onset atrial fibrillation. No additional embolism is seen. Overall mild atheromatous changes, most notably a mild stenosis of the left ICA at the clinoid segment Posterior circulation: The vertebrobasilar arteries are smooth and widely patent. No branch occlusion or flow limiting stenosis Venous sinuses: Patent Anatomic variants: None significant Delayed phase: Not obtained These results were called by telephone at the time of interpretation on 12/16/2018 at 11:55 am to Dr. Sharman CheekPHILLIP STAFFORD , who verbally acknowledged these results. Review of the MIP images confirms the above findings IMPRESSION: 1. Emergent left M2 occlusion/embolism with partial reconstitution of downstream vessels. 2. Mild atherosclerosis without flow limiting stenosis or embolic source seen in the neck. Electronically Signed   By: Marnee SpringJonathon  Watts M.D.   On: 12/16/2018 12:01   Ct Angio Neck W Or Wo Contrast  Result Date:  12/16/2018 CLINICAL DATA:  78 y/o  F; evaluation of stroke. EXAM: CT ANGIOGRAPHY HEAD AND NECK CT PERFUSION BRAIN TECHNIQUE: Multidetector CT imaging of the head and neck was performed using the standard protocol during bolus administration of intravenous contrast. Multiplanar CT image reconstructions and MIPs were obtained to evaluate the vascular anatomy. Carotid stenosis measurements (when applicable) are obtained utilizing NASCET criteria, using the distal internal carotid diameter as the denominator. Multiphase CT imaging of the brain was performed following IV bolus contrast injection. Subsequent parametric perfusion maps were calculated using RAPID software. CONTRAST:  100mL ISOVUE-370 IOPAMIDOL (ISOVUE-370) INJECTION 76% COMPARISON:  12/16/2018 CT head, CTA head, CTA neck. FINDINGS: CTA NECK FINDINGS Aortic arch: Standard branching. Imaged portion shows no evidence of aneurysm or dissection. No significant stenosis of the major arch vessel origins. Right carotid system: No evidence of dissection, stenosis (50% or greater) or occlusion. Left carotid system: No evidence of dissection, stenosis (50% or greater) or occlusion. Non stenotic mixed plaque of carotid bifurcation. Vertebral arteries:  Codominant. No evidence of dissection, stenosis (50% or greater) or occlusion. Skeleton: Cervical spondylosis with moderate discogenic degenerative changes from C4-C7. C3-4 and C7-T1 grade 1 anterolisthesis. Predominant left-sided upper cervical facet arthropathy. Other neck: Heterogeneous thyroid. Upper chest: Negative. Review of the MIP images confirms the above findings CTA HEAD FINDINGS Anterior circulation: No significant stenosis, proximal occlusion, aneurysm, or vascular malformation. Interval resolution of left M2 occlusion (series 13, image 31 compared with prior series 11, image 26). Mild atherosclerosis of carotid siphons with mild left paraclinoid ICA stenosis. Posterior circulation: No significant stenosis,  proximal occlusion, aneurysm, or vascular malformation. Venous sinuses: As permitted by contrast timing, patent. Anatomic variants: Complete circle-of-Willis. Review of the MIP images confirms the above findings CT Brain Perfusion Findings: CBF (<30%) Volume: 0mL Perfusion (Tmax>6.0s) volume: 9mL Mismatch Volume: 9mL Infarction Location:No core infarct by standard perfusion criteria. Small focus of oligemia in the left superolateral frontal lobe. IMPRESSION: 1. Interval resolution of left M2 occlusion. No new intracranial large vessel occlusion. 2. CT perfusion demonstrates no core infarct by standard perfusion criteria. 3. CT perfusion demonstrates a 9 cc focus of oligemia in the left superolateral frontal lobe which may reflect sequelae of recent ischemia from M2 occlusion or possibly a persistent small branch occlusion. 4. Patent carotid and vertebral arteries. No dissection, aneurysm, or hemodynamically significant stenosis utilizing NASCET criteria. These results were called by telephone at the time of interpretation on 12/16/2018 at 4:08 pm to Dr. Otelia Limes , who verbally acknowledged these results. Electronically Signed   By: Mitzi Hansen M.D.   On: 12/16/2018 16:21   Ct Angio Neck W And/or Wo Contrast  Result Date: 12/16/2018 CLINICAL DATA:  Acute aphasia, now resolved EXAM: CT ANGIOGRAPHY HEAD AND NECK TECHNIQUE: Multidetector CT imaging of the head and neck was performed using the standard protocol during bolus administration of intravenous contrast. Multiplanar CT image reconstructions and MIPs were obtained to evaluate the vascular anatomy. Carotid stenosis measurements (when applicable) are obtained utilizing NASCET criteria, using the distal internal carotid diameter as the denominator. CONTRAST:  75mL OMNIPAQUE IOHEXOL 350 MG/ML SOLN COMPARISON:  Noncontrast head CT earlier today FINDINGS: CTA NECK FINDINGS Aortic arch: Unremarkable or covered Right carotid system: Vessels are smooth and  widely patent. Probable mild atheromatous thickening of the common carotid wall. Left carotid system: Vessels are smooth and sufficiently patent. Low-density plaque at the ICA bulb. Atheromatous wall thickening of the common carotid. Vertebral arteries: Proximal subclavian atheromatous wall thickening on the left. The vertebrals are widely patent. Skeleton: Cervical spine degeneration with C3-4 anterolisthesis. Other neck: Heterogeneous thyroid, more so on the right, without discrete nodule. Upper chest: Patchy air trapping. Review of the MIP images confirms the above findings CTA HEAD FINDINGS Anterior circulation: Left M2 branch cut off with downstream variable reconstitution of the 2 attenuated downstream vessels. There is history of new onset atrial fibrillation. No additional embolism is seen. Overall mild atheromatous changes, most notably a mild stenosis of the left ICA at the clinoid segment Posterior circulation: The vertebrobasilar arteries are smooth and widely patent. No branch occlusion or flow limiting stenosis Venous sinuses: Patent Anatomic variants: None significant Delayed phase: Not obtained These results were called by telephone at the time of interpretation on 12/16/2018 at 11:55 am to Dr. Sharman Cheek , who verbally acknowledged these results. Review of the MIP images confirms the above findings IMPRESSION: 1. Emergent left M2 occlusion/embolism with partial reconstitution of downstream vessels. 2. Mild atherosclerosis without flow limiting stenosis or embolic source seen in the  neck. Electronically Signed   By: Marnee Spring M.D.   On: 12/16/2018 12:01   Mr Brain Wo Contrast  Result Date: 12/17/2018 CLINICAL DATA:  Stroke, follow-up. Left MCA branch vessel occlusion. Status post tPA. EXAM: MRI HEAD WITHOUT CONTRAST TECHNIQUE: Multiplanar, multiecho pulse sequences of the brain and surrounding structures were obtained without intravenous contrast. COMPARISON:  CTA head and neck  12/16/2018 FINDINGS: Brain: 3 focal areas of restricted diffusion are consistent with small foci of acute nonhemorrhagic infarction. The largest involves the left insular cortex measuring up to 6 mm. Two subcortical areas are present in the subinsular region and higher anterior left frontal lobe. T2 signal changes are associated with each of these areas. Moderate atrophy and white matter disease is present in addition. Dilated perivascular spaces are present in the basal ganglia. White matter changes extend into the brainstem. Remote posterior inferior left cerebellar infarct is present. The ventricles are of proportionate to the degree of atrophy. No significant extraaxial fluid collection is present. Vascular: Flow is present in the major intracranial arteries. Skull and upper cervical spine: The craniocervical junction is normal. Upper cervical spine is within normal limits. Marrow signal is unremarkable. Sinuses/Orbits: The paranasal sinuses and mastoid air cells are clear. Bilateral lens replacements are noted. Globes and orbits are otherwise unremarkable. IMPRESSION: 1. Three punctate focal areas of restricted diffusion indicating acute nonhemorrhagic infarction in the left MCA territory without other larger confluent infarct. The largest area involves the left insular cortex. 2. Atrophy and white matter disease is moderately advanced for age. This is consistent with chronic microvascular ischemic changes. 3. White matter disease extends into the brainstem. 4. 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 Electronically Signed   By: Marin Roberts M.D.   On: 12/17/2018 12:04   Ct Cerebral Perfusion W Contrast  Result Date: 12/16/2018 CLINICAL DATA:  78 y/o  F; evaluation of stroke. EXAM: CT ANGIOGRAPHY HEAD AND NECK CT PERFUSION BRAIN TECHNIQUE: Multidetector CT imaging of the head and neck was performed using the standard protocol during bolus administration of intravenous contrast.  Multiplanar CT image reconstructions and MIPs were obtained to evaluate the vascular anatomy. Carotid stenosis measurements (when applicable) are obtained utilizing NASCET criteria, using the distal internal carotid diameter as the denominator. Multiphase CT imaging of the brain was performed following IV bolus contrast injection. Subsequent parametric perfusion maps were calculated using RAPID software. CONTRAST:  ISOVUE-370 IOPAMIDOL (ISOVUE-370) INJECTION 76% COMPARISON:  12/16/2018 CT head, CTA head, CTA neck. FINDINGS: CTA NECK FINDINGS Aortic arch: Standard branching. Imaged portion shows no evidence of aneurysm or dissection. No significant stenosis of the major arch vessel origins. Right carotid system: No evidence of dissection, stenosis (50% or greater) or occlusion. Left carotid system: No evidence of dissection, stenosis (50% or greater) or occlusion. Non stenotic mixed plaque of carotid bifurcation. Vertebral arteries: Codominant. No evidence of dissection, stenosis (50% or greater) or occlusion. Skeleton: Cervical spondylosis with moderate discogenic degenerative changes from C4-C7. C3-4 and C7-T1 grade 1 anterolisthesis. Predominant left-sided upper cervical facet arthropathy. Other neck: Heterogeneous thyroid. Upper chest: Negative. Review of the MIP images confirms the above findings CTA HEAD FINDINGS Anterior circulation: No significant stenosis, proximal occlusion, aneurysm, or vascular malformation. Interval resolution of left M2 occlusion (series 13, image 31 compared with prior series 11, image 26). Mild atherosclerosis of carotid siphons with mild left paraclinoid ICA stenosis. Posterior circulation: No significant stenosis, proximal occlusion, aneurysm, or vascular malformation. Venous sinuses: As permitted by contrast timing, patent. Anatomic variants:  Complete circle-of-Willis. Review of the MIP images confirms the above findings CT Brain Perfusion Findings: CBF (<30%) Volume: 25mL  Perfusion (Tmax>6.0s) volume: 81mL Mismatch Volume: 68mL Infarction Location:No core infarct by standard perfusion criteria. Small focus of oligemia in the left superolateral frontal lobe. IMPRESSION: 1. Interval resolution of left M2 occlusion. No new intracranial large vessel occlusion. 2. CT perfusion demonstrates no core infarct by standard perfusion criteria. 3. CT perfusion demonstrates a 9 cc focus of oligemia in the left superolateral frontal lobe which may reflect sequelae of recent ischemia from M2 occlusion or possibly a persistent small branch occlusion. 4. Patent carotid and vertebral arteries. No dissection, aneurysm, or hemodynamically significant stenosis utilizing NASCET criteria. These results were called by telephone at the time of interpretation on 12/16/2018 at 4:08 pm to Dr. Otelia Limes , who verbally acknowledged these results. Electronically Signed   By: Mitzi Hansen M.D.   On: 12/16/2018 16:21   Ct Head Code Stroke Wo Contrast  Result Date: 12/16/2018 CLINICAL DATA:  Code stroke. Speech difficulty beginning at 9:30 a.m. EXAM: CT HEAD WITHOUT CONTRAST TECHNIQUE: Contiguous axial images were obtained from the base of the skull through the vertex without intravenous contrast. COMPARISON:  None. FINDINGS: Brain: No evidence of acute infarction, hemorrhage, hydrocephalus, extra-axial collection or mass lesion/mass effect. Mild for age cerebral volume loss and chronic small-vessel ischemia in the cerebral white matter. Vascular: No hyperdense vessel or unexpected calcification. Skull: Normal. Negative for fracture or focal lesion. Sinuses/Orbits: Bilateral cataract resection Other: These results were called by telephone at the time of interpretation on 12/16/2018 at 11:14 am to Dr. Sharman Cheek , who verbally acknowledged these results. ASPECTS [facility] Stroke Program Early CT Score) - Ganglionic level infarction (caudate, lentiform nuclei, internal capsule, insula, M1-M3 cortex): 7 -  Supraganglionic infarction (M4-M6 cortex): 3 Total score (0-10 with 10 being normal): 10 IMPRESSION: Negative for hemorrhage or visible acute infarct. Electronically Signed   By: Marnee Spring M.D.   On: 12/16/2018 11:15    2D Echocardiogram   1. The left ventricle has normal systolic function of 60-65%. The cavity size is normal. There is no increased left ventricular wall thickness. Left ventricular diastology could not be evaluated secondary to atrial fibrillation.  2. The right ventricle has normal systolic function. The cavity in normal in size. There is no increase in right ventricular wall thickness.  3. Mildly dilated left atrial size.  4. The mitral valve is normal in structure There is mild thickening.  5. The aortic valve is tricuspid. There is mild thickening of the aortic valve.  6. Normal LV systolic function; mild TR.     HISTORY OF PRESENT ILLNESS EMY TARANTINO is a 78 y.o. female with history of prediabetes, hypertension, heart murmur, esophageal spasms and hard of hearing.  Patient presented [facility].  At that time her main issue was that she was on the phone this morning 12/16/2018 at approximately 9:30 AM and had been witnessed per her family that she was not able to get her words out.  She denied any focal motor weakness or slurred speech.  While in the ED all her symptoms had resolved and NIH stroke scale was 0.  She obtained a CT of the brain which showed no acute changes.  However at 1358 her symptoms again were present and then disappeared at 1430.  Patient was administered tPA at 1500 hrs. and transferred to [facility].  While at [facility] she did have a CT angiogram of head  and neck.  The reading showed a emergent left M2 occlusion/embolism with partial reconstitution of downstream vessels.  It also showed mild atherosclerosis without flow-limiting stenosis or embolic source seen in the neck. Premorbid modified Rankin scale (mRS): 0. NIH stroke scale: 2.  While arriving at [facility] patient was noted to have mild aphasia and trouble following some commands otherwise normal exam.  Patient was immediately taken for follow-up CTA of head and neck along with a CT perfusion which showed interval resolution of left M2 occlusion with no new occlusion. No core infarct seen on standard perfusion criteria with a 9 cc focus of oligemia in the left superolateral frontal lobe which may reflect sequelae of recent ischemia from M2 occlusion or possibly a persistent small branch occlusion.  She was admitted to the neuro ICU for further evaluation and treatment.    HOSPITAL COURSE Ms. BRIYAH WHEELWRIGHT is a 78 y.o. 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 Treated with tPA 12/16/2018 at 1500 and transferred to [facility]. Once at [facility], repeat imaging showed resolution of M2 clot.  Infarct felt to be embolic secondary to new diagnosis of atrial fibrillation.  Cardiology consulted. Started on Eliquis.  Planned cardioversion in 3-4 weeks. OP SLP recommmended.  Stroke:  left frontal infarct s/p IV tPA, infarct embolic secondary to new dx AF  Code Stroke CT head 2/3 1115 No acute stroke. ASPECTS 10.     CTA head & neck 2/3 1201 ELVO L M2 w/ partial reconstitution. Mild atherosclerosis   CTA head & neck, CT perfusion 2/3 1608 interval resolution L M2 occlusion. No core infarct. 9cc L frontal lobe ischemia.  MRI  Tiny punctate left insular cortex and LMCA branch infarcts  2D Echo  EF 60-65%. No source of embolus   LDL 97  HgbA1c 5.7  aspirin 81 mg daily prior to admission, now on Eliquis bid.   Therapy recommendations:  OP SLP, no PT or OT  Disposition:  return home with family  Atrial Fibrillation, new dx  Home anticoagulation:  No antithrombotic continued in the hospital  CHA2DS2-VASc Score = 6, ?2 oral anticoagulation recommended              Age in Years:  ?63   +2                        Sex:  Female   Female   +1                      Hypertension History:  yes   +1                        Diabetes Mellitus:  0    Congestive Heart Failure History:  0             Vascular Disease History:  0                           Stroke/TIA/Thromboembolism History:  yes   +2  Cardiology consulted for new AF  They plan cardioversion in 3 to 4 weeks.  They will set follow-up appointment.    Hypertension  Stable  Blood pressure stable in hospital off home medications, felt to be due to low-salt /heart healthy diet   Home meds:  norvasc 5, HCTZ 25, cozaar 100  BP 100s in hospital along with new addition of metoprolol  Resume cozaar. Continue metoprolol  Hold HCTZ and norvasc  Follow up with medical MD and cardiologist. Discussed with husband.  BP goal normotensive  Hyperlipidemia  Home meds:  No statin  LDL 97, goal < 70  Added statin LIPITOR  20  Continue statin at discharge  Pre-Diabetes  HgbA1c 5.7, at goal < 7.0  Other Stroke Risk Factors  Advanced age  ETOH use, advised to drink no more than 1 drink(s) a day  Obesity, Body mass index is 33.19 kg/m., recommend weight loss, diet and exercise as appropriate   Other Active Problems  Normal memory changes, no noted cognitive decline PTA  Cr 1.06   DISCHARGE EXAM Blood pressure 133/75, pulse 72, temperature 97.8 F (36.6 C), temperature source Oral, resp. rate 18, height 5\' 2"  (1.575 m), weight 83.6 kg, SpO2 97 %. Pleasant elderly Caucasian lady currently not in distress. Afebrile. Head is nontraumatic. Neck is supple without bruit. Cardiac exam no murmur or gallop. Lungs are clear to auscultation. Distal pulses are well felt. Neurological Exam Awake  Alert oriented x 3. Normal speech and language visit on the minimum word hesitancy. Able to name and repeat quite well. Good comprehension.[place] movements full without nystagmus.fundi were not visualized.  Vision acuity and fields appear normal. Hearing is normal. Palatal movements are normal. Face symmetric. Tongue midline. Normal strength, tone, reflexes and coordination. Normal sensation. Gait deferred.  Discharge Diet   heart healthy carb modified thin liquids  DISCHARGE PLAN  Disposition:  Home with family  Outpatient speech therapy for language, cognition  Eliquis (apixaban) daily for secondary stroke prevention.  Cardioversion planned in 3 to 4 weeks.  Cardiology will set up follow-up appointment.  Ongoing risk factor control by Primary Care Physician at time of discharge  Follow-up [name], MD in 2 weeks.  Follow-up in [facility] in 4 weeks, office to schedule an appointment.   40 minutes were spent preparing discharge.  Annie Main, MSN, APRN, ANVP-BC, AGPCNP-BC Advanced Practice Stroke Nurse [facility] See Amion for Schedule & Pager information 12/18/2018 1:39 PM   I have personally obtained history,examined this patient, reviewed notes, independently viewed imaging studies, participated in medical decision making and plan of care.ROS completed by me personally and pertinent positives fully documented  I have made any additions or clarifications directly to the above note. Agree with note above.    Delia Heady, MD Medical Director [facility] Pager: [phone] 12/18/2018 8:15 PM

## 2018-12-18 NOTE — Progress Notes (Signed)
Progress Note  Patient Name: Emma Rivera Date of Encounter: 12/18/2018  Primary Cardiologist: New , Zanaria Morell   Subjective   78 year old female history of hypertension, prediabetes with a recent stroke.  She was found to have new onset atrial fibrillation.  She is feeling quite well.  She remains in atrial fibrillation.  She cannot tell that her heart rate is irregular. She received TPA when she was found to have a stroke and has made a near complete recovery following her stroke.  Inpatient Medications    Scheduled Meds: . apixaban  5 mg Oral BID  . atorvastatin  20 mg Oral q1800  . citalopram  20 mg Oral Daily  . fesoterodine  4 mg Oral Daily  . multivitamin with minerals  1 tablet Oral Daily  . pantoprazole  40 mg Oral QHS   Continuous Infusions: . sodium chloride 75 mL/hr at 12/17/18 0751   PRN Meds: senna-docusate   Vital Signs    Vitals:   12/18/18 0100 12/18/18 0300 12/18/18 0339 12/18/18 0725  BP: 103/63 (!) 137/53 109/70 133/80  Pulse: 95 96 89 74  Resp: 19 18 18 18   Temp: 98.4 F (36.9 C) 98.6 F (37 C) 97.8 F (36.6 C) 97.8 F (36.6 C)  TempSrc: Oral Oral Oral Oral  SpO2: 94% 96% 99% 97%  Weight:      Height:        Intake/Output Summary (Last 24 hours) at 12/18/2018 1013 Last data filed at 12/18/2018 0600 Gross per 24 hour  Intake 1835 ml  Output 1001 ml  Net 834 ml   Last 3 Weights 12/17/2018 12/16/2018 12/16/2018  Weight (lbs) 184 lb 4.9 oz 181 lb 7 oz 175 lb  Weight (kg) 83.6 kg 82.3 kg 79.379 kg      Telemetry    Atrial fib with controlled V response  - Personally Reviewed  ECG     - Personally Reviewed  Physical Exam    GEN:   Middle age female, NAD    Neck: No JVD Cardiac:   Irreg. Irreg. Marland Kitchen  Respiratory: Clear to auscultation bilaterally. GI: Soft, nontender, non-distended  MS: No edema; No deformity. Neuro:  Nonfocal  Psych: Normal affect   Labs    Chemistry Recent Labs  Lab 12/16/18 1058  NA 138  K 4.0  CL 103  CO2 27    GLUCOSE 104*  BUN 19  CREATININE 1.06*  CALCIUM 9.3  PROT 7.3  ALBUMIN 3.9  AST 26  ALT 22  ALKPHOS 49  BILITOT 1.3*  GFRNONAA 51*  GFRAA 59*  ANIONGAP 8     Hematology Recent Labs  Lab 12/16/18 1058  WBC 9.3  RBC 4.44  HGB 14.2  HCT 43.3  MCV 97.5  MCH 32.0  MCHC 32.8  RDW 13.1  PLT 215    Cardiac Enzymes Recent Labs  Lab 12/16/18 1058  TROPONINI <0.03   No results for input(s): TROPIPOC in the last 168 hours.   BNPNo results for input(s): BNP, PROBNP in the last 168 hours.   DDimer No results for input(s): DDIMER in the last 168 hours.   Radiology    Ct Angio Head W Or Wo Contrast  Result Date: 12/16/2018 CLINICAL DATA:  78 y/o  F; evaluation of stroke. EXAM: CT ANGIOGRAPHY HEAD AND NECK CT PERFUSION BRAIN TECHNIQUE: Multidetector CT imaging of the head and neck was performed using the standard protocol during bolus administration of intravenous contrast. Multiplanar CT image reconstructions and MIPs were obtained  to evaluate the vascular anatomy. Carotid stenosis measurements (when applicable) are obtained utilizing NASCET criteria, using the distal internal carotid diameter as the denominator. Multiphase CT imaging of the brain was performed following IV bolus contrast injection. Subsequent parametric perfusion maps were calculated using RAPID software. CONTRAST:  ISOVUE-370 IOPAMIDOL (ISOVUE-370) INJECTION 76% COMPARISON:  12/16/2018 CT head, CTA head, CTA neck. FINDINGS: CTA NECK FINDINGS Aortic arch: Standard branching. Imaged portion shows no evidence of aneurysm or dissection. No significant stenosis of the major arch vessel origins. Right carotid system: No evidence of dissection, stenosis (50% or greater) or occlusion. Left carotid system: No evidence of dissection, stenosis (50% or greater) or occlusion. Non stenotic mixed plaque of carotid bifurcation. Vertebral arteries: Codominant. No evidence of dissection, stenosis (50% or greater) or occlusion.  Skeleton: Cervical spondylosis with moderate discogenic degenerative changes from C4-C7. C3-4 and C7-T1 grade 1 anterolisthesis. Predominant left-sided upper cervical facet arthropathy. Other neck: Heterogeneous thyroid. Upper chest: Negative. Review of the MIP images confirms the above findings CTA HEAD FINDINGS Anterior circulation: No significant stenosis, proximal occlusion, aneurysm, or vascular malformation. Interval resolution of left M2 occlusion (series 13, image 31 compared with prior series 11, image 26). Mild atherosclerosis of carotid siphons with mild left paraclinoid ICA stenosis. Posterior circulation: No significant stenosis, proximal occlusion, aneurysm, or vascular malformation. Venous sinuses: As permitted by contrast timing, patent. Anatomic variants: Complete circle-of-Willis. Review of the MIP images confirms the above findings CT Brain Perfusion Findings: CBF (<30%) Volume: 54mL Perfusion (Tmax>6.0s) volume: 37mL Mismatch Volume: 19mL Infarction Location:No core infarct by standard perfusion criteria. Small focus of oligemia in the left superolateral frontal lobe. IMPRESSION: 1. Interval resolution of left M2 occlusion. No new intracranial large vessel occlusion. 2. CT perfusion demonstrates no core infarct by standard perfusion criteria. 3. CT perfusion demonstrates a 9 cc focus of oligemia in the left superolateral frontal lobe which may reflect sequelae of recent ischemia from M2 occlusion or possibly a persistent small branch occlusion. 4. Patent carotid and vertebral arteries. No dissection, aneurysm, or hemodynamically significant stenosis utilizing NASCET criteria. These results were called by telephone at the time of interpretation on 12/16/2018 at 4:08 pm to Dr. Otelia Limes , who verbally acknowledged these results. Electronically Signed   By: Mitzi Hansen M.D.   On: 12/16/2018 16:21   Ct Angio Head W Or Wo Contrast  Result Date: 12/16/2018 CLINICAL DATA:  Acute aphasia, now  resolved EXAM: CT ANGIOGRAPHY HEAD AND NECK TECHNIQUE: Multidetector CT imaging of the head and neck was performed using the standard protocol during bolus administration of intravenous contrast. Multiplanar CT image reconstructions and MIPs were obtained to evaluate the vascular anatomy. Carotid stenosis measurements (when applicable) are obtained utilizing NASCET criteria, using the distal internal carotid diameter as the denominator. CONTRAST:  55mL OMNIPAQUE IOHEXOL 350 MG/ML SOLN COMPARISON:  Noncontrast head CT earlier today FINDINGS: CTA NECK FINDINGS Aortic arch: Unremarkable or covered Right carotid system: Vessels are smooth and widely patent. Probable mild atheromatous thickening of the common carotid wall. Left carotid system: Vessels are smooth and sufficiently patent. Low-density plaque at the ICA bulb. Atheromatous wall thickening of the common carotid. Vertebral arteries: Proximal subclavian atheromatous wall thickening on the left. The vertebrals are widely patent. Skeleton: Cervical spine degeneration with C3-4 anterolisthesis. Other neck: Heterogeneous thyroid, more so on the right, without discrete nodule. Upper chest: Patchy air trapping. Review of the MIP images confirms the above findings CTA HEAD FINDINGS Anterior circulation: Left M2 branch cut off with downstream variable reconstitution  of the 2 attenuated downstream vessels. There is history of new onset atrial fibrillation. No additional embolism is seen. Overall mild atheromatous changes, most notably a mild stenosis of the left ICA at the clinoid segment Posterior circulation: The vertebrobasilar arteries are smooth and widely patent. No branch occlusion or flow limiting stenosis Venous sinuses: Patent Anatomic variants: None significant Delayed phase: Not obtained These results were called by telephone at the time of interpretation on 12/16/2018 at 11:55 am to Dr. Sharman CheekPHILLIP STAFFORD , who verbally acknowledged these results. Review of the  MIP images confirms the above findings IMPRESSION: 1. Emergent left M2 occlusion/embolism with partial reconstitution of downstream vessels. 2. Mild atherosclerosis without flow limiting stenosis or embolic source seen in the neck. Electronically Signed   By: Marnee SpringJonathon  Watts M.D.   On: 12/16/2018 12:01   Ct Angio Neck W Or Wo Contrast  Result Date: 12/16/2018 CLINICAL DATA:  78 y/o  F; evaluation of stroke. EXAM: CT ANGIOGRAPHY HEAD AND NECK CT PERFUSION BRAIN TECHNIQUE: Multidetector CT imaging of the head and neck was performed using the standard protocol during bolus administration of intravenous contrast. Multiplanar CT image reconstructions and MIPs were obtained to evaluate the vascular anatomy. Carotid stenosis measurements (when applicable) are obtained utilizing NASCET criteria, using the distal internal carotid diameter as the denominator. Multiphase CT imaging of the brain was performed following IV bolus contrast injection. Subsequent parametric perfusion maps were calculated using RAPID software. CONTRAST:  100mL ISOVUE-370 IOPAMIDOL (ISOVUE-370) INJECTION 76% COMPARISON:  12/16/2018 CT head, CTA head, CTA neck. FINDINGS: CTA NECK FINDINGS Aortic arch: Standard branching. Imaged portion shows no evidence of aneurysm or dissection. No significant stenosis of the major arch vessel origins. Right carotid system: No evidence of dissection, stenosis (50% or greater) or occlusion. Left carotid system: No evidence of dissection, stenosis (50% or greater) or occlusion. Non stenotic mixed plaque of carotid bifurcation. Vertebral arteries: Codominant. No evidence of dissection, stenosis (50% or greater) or occlusion. Skeleton: Cervical spondylosis with moderate discogenic degenerative changes from C4-C7. C3-4 and C7-T1 grade 1 anterolisthesis. Predominant left-sided upper cervical facet arthropathy. Other neck: Heterogeneous thyroid. Upper chest: Negative. Review of the MIP images confirms the above findings  CTA HEAD FINDINGS Anterior circulation: No significant stenosis, proximal occlusion, aneurysm, or vascular malformation. Interval resolution of left M2 occlusion (series 13, image 31 compared with prior series 11, image 26). Mild atherosclerosis of carotid siphons with mild left paraclinoid ICA stenosis. Posterior circulation: No significant stenosis, proximal occlusion, aneurysm, or vascular malformation. Venous sinuses: As permitted by contrast timing, patent. Anatomic variants: Complete circle-of-Willis. Review of the MIP images confirms the above findings CT Brain Perfusion Findings: CBF (<30%) Volume: 0mL Perfusion (Tmax>6.0s) volume: 9mL Mismatch Volume: 9mL Infarction Location:No core infarct by standard perfusion criteria. Small focus of oligemia in the left superolateral frontal lobe. IMPRESSION: 1. Interval resolution of left M2 occlusion. No new intracranial large vessel occlusion. 2. CT perfusion demonstrates no core infarct by standard perfusion criteria. 3. CT perfusion demonstrates a 9 cc focus of oligemia in the left superolateral frontal lobe which may reflect sequelae of recent ischemia from M2 occlusion or possibly a persistent small branch occlusion. 4. Patent carotid and vertebral arteries. No dissection, aneurysm, or hemodynamically significant stenosis utilizing NASCET criteria. These results were called by telephone at the time of interpretation on 12/16/2018 at 4:08 pm to Dr. Otelia LimesLindzen , who verbally acknowledged these results. Electronically Signed   By: Mitzi HansenLance  Furusawa-Stratton M.D.   On: 12/16/2018 16:21   Ct Angio Neck  W And/or Wo Contrast  Result Date: 12/16/2018 CLINICAL DATA:  Acute aphasia, now resolved EXAM: CT ANGIOGRAPHY HEAD AND NECK TECHNIQUE: Multidetector CT imaging of the head and neck was performed using the standard protocol during bolus administration of intravenous contrast. Multiplanar CT image reconstructions and MIPs were obtained to evaluate the vascular anatomy.  Carotid stenosis measurements (when applicable) are obtained utilizing NASCET criteria, using the distal internal carotid diameter as the denominator. CONTRAST:  75mL OMNIPAQUE IOHEXOL 350 MG/ML SOLN COMPARISON:  Noncontrast head CT earlier today FINDINGS: CTA NECK FINDINGS Aortic arch: Unremarkable or covered Right carotid system: Vessels are smooth and widely patent. Probable mild atheromatous thickening of the common carotid wall. Left carotid system: Vessels are smooth and sufficiently patent. Low-density plaque at the ICA bulb. Atheromatous wall thickening of the common carotid. Vertebral arteries: Proximal subclavian atheromatous wall thickening on the left. The vertebrals are widely patent. Skeleton: Cervical spine degeneration with C3-4 anterolisthesis. Other neck: Heterogeneous thyroid, more so on the right, without discrete nodule. Upper chest: Patchy air trapping. Review of the MIP images confirms the above findings CTA HEAD FINDINGS Anterior circulation: Left M2 branch cut off with downstream variable reconstitution of the 2 attenuated downstream vessels. There is history of new onset atrial fibrillation. No additional embolism is seen. Overall mild atheromatous changes, most notably a mild stenosis of the left ICA at the clinoid segment Posterior circulation: The vertebrobasilar arteries are smooth and widely patent. No branch occlusion or flow limiting stenosis Venous sinuses: Patent Anatomic variants: None significant Delayed phase: Not obtained These results were called by telephone at the time of interpretation on 12/16/2018 at 11:55 am to Dr. Sharman Cheek , who verbally acknowledged these results. Review of the MIP images confirms the above findings IMPRESSION: 1. Emergent left M2 occlusion/embolism with partial reconstitution of downstream vessels. 2. Mild atherosclerosis without flow limiting stenosis or embolic source seen in the neck. Electronically Signed   By: Marnee Spring M.D.   On:  12/16/2018 12:01   Mr Brain Wo Contrast  Result Date: 12/17/2018 CLINICAL DATA:  Stroke, follow-up. Left MCA branch vessel occlusion. Status post tPA. EXAM: MRI HEAD WITHOUT CONTRAST TECHNIQUE: Multiplanar, multiecho pulse sequences of the brain and surrounding structures were obtained without intravenous contrast. COMPARISON:  CTA head and neck 12/16/2018 FINDINGS: Brain: 3 focal areas of restricted diffusion are consistent with small foci of acute nonhemorrhagic infarction. The largest involves the left insular cortex measuring up to 6 mm. Two subcortical areas are present in the subinsular region and higher anterior left frontal lobe. T2 signal changes are associated with each of these areas. Moderate atrophy and white matter disease is present in addition. Dilated perivascular spaces are present in the basal ganglia. White matter changes extend into the brainstem. Remote posterior inferior left cerebellar infarct is present. The ventricles are of proportionate to the degree of atrophy. No significant extraaxial fluid collection is present. Vascular: Flow is present in the major intracranial arteries. Skull and upper cervical spine: The craniocervical junction is normal. Upper cervical spine is within normal limits. Marrow signal is unremarkable. Sinuses/Orbits: The paranasal sinuses and mastoid air cells are clear. Bilateral lens replacements are noted. Globes and orbits are otherwise unremarkable. IMPRESSION: 1. Three punctate focal areas of restricted diffusion indicating acute nonhemorrhagic infarction in the left MCA territory without other larger confluent infarct. The largest area involves the left insular cortex. 2. Atrophy and white matter disease is moderately advanced for age. This is consistent with chronic microvascular ischemic changes. 3. White  matter disease extends into the brainstem. 4. 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 Electronically Signed   By: Marin Roberts  M.D.   On: 12/17/2018 12:04   Ct Cerebral Perfusion W Contrast  Result Date: 12/16/2018 CLINICAL DATA:  78 y/o  F; evaluation of stroke. EXAM: CT ANGIOGRAPHY HEAD AND NECK CT PERFUSION BRAIN TECHNIQUE: Multidetector CT imaging of the head and neck was performed using the standard protocol during bolus administration of intravenous contrast. Multiplanar CT image reconstructions and MIPs were obtained to evaluate the vascular anatomy. Carotid stenosis measurements (when applicable) are obtained utilizing NASCET criteria, using the distal internal carotid diameter as the denominator. Multiphase CT imaging of the brain was performed following IV bolus contrast injection. Subsequent parametric perfusion maps were calculated using RAPID software. CONTRAST:  ISOVUE-370 IOPAMIDOL (ISOVUE-370) INJECTION 76% COMPARISON:  12/16/2018 CT head, CTA head, CTA neck. FINDINGS: CTA NECK FINDINGS Aortic arch: Standard branching. Imaged portion shows no evidence of aneurysm or dissection. No significant stenosis of the major arch vessel origins. Right carotid system: No evidence of dissection, stenosis (50% or greater) or occlusion. Left carotid system: No evidence of dissection, stenosis (50% or greater) or occlusion. Non stenotic mixed plaque of carotid bifurcation. Vertebral arteries: Codominant. No evidence of dissection, stenosis (50% or greater) or occlusion. Skeleton: Cervical spondylosis with moderate discogenic degenerative changes from C4-C7. C3-4 and C7-T1 grade 1 anterolisthesis. Predominant left-sided upper cervical facet arthropathy. Other neck: Heterogeneous thyroid. Upper chest: Negative. Review of the MIP images confirms the above findings CTA HEAD FINDINGS Anterior circulation: No significant stenosis, proximal occlusion, aneurysm, or vascular malformation. Interval resolution of left M2 occlusion (series 13, image 31 compared with prior series 11, image 26). Mild atherosclerosis of carotid siphons with mild  left paraclinoid ICA stenosis. Posterior circulation: No significant stenosis, proximal occlusion, aneurysm, or vascular malformation. Venous sinuses: As permitted by contrast timing, patent. Anatomic variants: Complete circle-of-Willis. Review of the MIP images confirms the above findings CT Brain Perfusion Findings: CBF (<30%) Volume: 0mL Perfusion (Tmax>6.0s) volume: 9mL Mismatch Volume: 9mL Infarction Location:No core infarct by standard perfusion criteria. Small focus of oligemia in the left superolateral frontal lobe. IMPRESSION: 1. Interval resolution of left M2 occlusion. No new intracranial large vessel occlusion. 2. CT perfusion demonstrates no core infarct by standard perfusion criteria. 3. CT perfusion demonstrates a 9 cc focus of oligemia in the left superolateral frontal lobe which may reflect sequelae of recent ischemia from M2 occlusion or possibly a persistent small branch occlusion. 4. Patent carotid and vertebral arteries. No dissection, aneurysm, or hemodynamically significant stenosis utilizing NASCET criteria. These results were called by telephone at the time of interpretation on 12/16/2018 at 4:08 pm to Dr. Otelia Limes , who verbally acknowledged these results. Electronically Signed   By: Mitzi Hansen M.D.   On: 12/16/2018 16:21   Ct Head Code Stroke Wo Contrast  Result Date: 12/16/2018 CLINICAL DATA:  Code stroke. Speech difficulty beginning at 9:30 a.m. EXAM: CT HEAD WITHOUT CONTRAST TECHNIQUE: Contiguous axial images were obtained from the base of the skull through the vertex without intravenous contrast. COMPARISON:  None. FINDINGS: Brain: No evidence of acute infarction, hemorrhage, hydrocephalus, extra-axial collection or mass lesion/mass effect. Mild for age cerebral volume loss and chronic small-vessel ischemia in the cerebral white matter. Vascular: No hyperdense vessel or unexpected calcification. Skull: Normal. Negative for fracture or focal lesion. Sinuses/Orbits:  Bilateral cataract resection Other: These results were called by telephone at the time of interpretation on 12/16/2018 at 11:14 am to  Dr. Sharman Cheek , who verbally acknowledged these results. ASPECTS [facility] Stroke Program Early CT Score) - Ganglionic level infarction (caudate, lentiform nuclei, internal capsule, insula, M1-M3 cortex): 7 - Supraganglionic infarction (M4-M6 cortex): 3 Total score (0-10 with 10 being normal): 10 IMPRESSION: Negative for hemorrhage or visible acute infarct. Electronically Signed   By: Marnee Spring M.D.   On: 12/16/2018 11:15    Cardiac Studies     Patient Profile     78 y.o. female with a new diagnosis of atrial fibrillation.  She also has a history of hypertension and prediabetes.  She was admitted with a stroke.  Assessment & Plan    1.  Atrial fibrillation: Her rate is fairly well controlled.  It gets a little fast when she is walking.  We will add Toprol-XL 25 mg a day.  She is on Eliquis 5 mg twice a day.  Anticipate doing a cardioversion in approximately 4 weeks.  She is has a cruise planned approximately 3 weeks from now and we anticipate getting her back into the office following her cruise and setting her up for an outpatient cardioversion.  2.  Hypertension: Her blood pressure has been very well controlled here in the hospital.  I suspect it is because her diet has been better than it is when she is outside the hospital. Amlodipine, losartan, and HCTZ have all been held. Pressure remains fairly normal. Back in the office in approximately 2 weeks to make sure that her blood pressure remained stable.  She is doing very well.  Anticipate that she could be discharged to home very soon-possibly today.   [facility] will sign off.   Medication Recommendations:  Continue meds  Other recommendations (labs, testing, etc):   Follow up as an outpatient:  Follow up with me or APP in 2 weeks .  For questions or updates, please contact [facility] Please consult [email or web address] for contact info under        Signed, Kristeen Miss, MD  12/18/2018, 10:13 AM

## 2018-12-18 NOTE — Progress Notes (Signed)
Physical Therapy Discharge Patient Details Name: Emma Rivera MRN: [id number] DOB: 12/12/1940 Today's Date: 12/18/2018 Time: 1016-1032 PT Time Calculation (min) (ACUTE ONLY): 16 min  Patient discharged from PT services secondary to goals met and no further PT needs identified.  Please see latest therapy progress note for current level of functioning and progress toward goals.    Progress and discharge plan discussed with patient and/or caregiver: Patient/Caregiver agrees with plan   See treatment note for further details.   Brittany Groseclose, PT, DPT  Acute Rehabilitation Services  Pager: [phone] Office: [phone]      Brittany S Groseclose 12/18/2018, 12:33 PM  

## 2018-12-23 ENCOUNTER — Telehealth: Payer: Self-pay | Admitting: Nurse Practitioner

## 2018-12-23 NOTE — Telephone Encounter (Addendum)
-----   Message from Vesta Mixer, MD sent at 12/19/2018  9:27 AM EST ----- Good morning Emma Rivera is a patient who I just picked up after she had a CVA She was found to have Afib Started on Eliquis She also has HTN but we have greatly cut back on her BP meds because her BP was running low / normal in the hospital  I have asked her to record her BP daily and she is to see [place] or an APP in 2 weeks. She is leaving on a cruise in 3 weeks and I wanted to make sure she was stable for the cruise  I told her to call us if her BP needed attention sooner than the 2 week follow up so you may get a phone call  from her     Left message for patient to call back to schedule appointment

## 2018-12-23 NOTE — Telephone Encounter (Signed)
New Message   Patient returning phone call would like to speak to nurse before making appointment, due to seeing her PCP and he made some changes so she would like to discuss it with the nurse.

## 2018-12-23 NOTE — Telephone Encounter (Signed)
Patient states she went to her PCP today and was advised that ECG did not show a fib, reports HR was low so he advised her to reduce Toprol to 12.5 mg once daily. He also restarted her HCTZ for high BP.  She has a follow-up appointment with Dr. Burnadette Pop on 2/21 prior to her cruise. She states at this time she will call back to follow-up after the cruise if she continues to have a fib. I answered questions to her satisfaction and she thanked me for the call.

## 2018-12-25 ENCOUNTER — Other Ambulatory Visit: Payer: Self-pay | Admitting: *Deleted

## 2018-12-25 NOTE — Patient Outreach (Signed)
[facility]) Care Management  12/25/2018  Emma Rivera 1941-06-27 [id number]   EMMI-stroke  RED ON EMMI ALERT Day # 6 Date: Tuesday 12/24/2018 1005  Red Alert Reason: Questions/problems with meds? Yes Smoked or been around smoke? Yes Insurance: united health care medicare   [facility] admissions x 1 ED visits x 1  in the last 6 months  Last admission 12/16/2018 to 12/18/2018  Outreach attempt # 1  Patient is able to verify HIPAA [facility] Care Management RN reviewed and addressed red alert with patient  Emma Rivera reports that all the documented answers to the above EMMI questions were incorrect She denies having questions/problems with meds nor has she smoked or been around smoke   Emma Rivera reports mechanical errors with the EMMI questioniong She reports questions were repeated and when she did not respond it would pick up answers and proceed to the next question. She reports pausing for periods of time to allow the automated system to discontinue speaking   She reports she is doing very well since her discharge and denies any medical concerns She is getting ready to go on a cruise   Social: Emma Rivera lives at home with her spouse  She is independent with her care needs She denies concern with transportation to medical appointments   Conditions: CVA, atrial fibrillation on elquis  HTN, HDL, prediabetes, obesity, GERD, heart murmur, HOH, esophageal spasm, depression   Medications: denies concerns with taking medications as prescribed, affording medications, side effects of medications and questions about medications    Appointments: Saw primary MD Linthavong on 12/23/2018 follows up on 01/03/2019   Advance Directives: She has a living will and POA   Consent: [facility] RN CM reviewed [facility] services with patient. Patient gave verbal consent for services.   Advised patient that there will be further automated EMMI- post discharge calls to assess how the patient is doing following  the recent hospitalization Advised the patient that another call may be received from a nurse if any of their responses were abnormal. Patient voiced understanding and was appreciative of f/u call.   Plan: [facility] RN CM will close case at this time as patient has been assessed and no needs identified/needs resolved.   Pt encouraged to return a call to [facility] RN CM prn  [facility] RN CM sent a successful outreach letter as discussed with [facility] brochure enclosed for review  [name] RN CM will prepare for case closure per call attempts workflow if no return call from this patient.  An unsuccessful outreach letter was sent on ______.   Emma L. Noelle Penner, RN, BSN, CCM [facility] Telephonic Care Management Care Coordinator Office number [phone] Mobile number [phone]  Main [facility] number [phone] Fax number [phone]

## 2019-01-01 ENCOUNTER — Other Ambulatory Visit: Payer: Self-pay | Admitting: *Deleted

## 2019-01-01 NOTE — Patient Outreach (Signed)
[facility]) Care Management  01/01/2019  Emma Rivera 09-06-1941 [id number]  EMMI- stroke  RED ON EMMI ALERT Day # 13 Date: Tuesday 12/31/2018 1000 Red Alert Reason: Feeling worse overall? Yes Questions/problems with meds? Yes Problems refilling medications?Yes  Insurance:  united health care medicare  [facility] admissions x 1  ED visits x 1 in the last 6 months    Outreach attempt # 1  Patient is able to verify HIPAA [facility] Care Management RN reviewed and addressed red alert with patient  Emma Rivera states the [facility] automated system was repeating questions and unable to understand her responses on 12/31/2018  She reports all her answers to the above questions are "No" She denies feeling worse overall. She denies questions/problems with meds or refilling medications  Social: Emma Rivera lives at home with her spouse Dimas Aguas She is independent with her care needs She denies concern with transportation to medical appointments   Conditions: CVA, atrial fibrillation on elquis  HTN, HDL, prediabetes, obesity, GERD, heart murmur, HOH, esophageal spasm, depression   Medications: denies concerns with taking medications as prescribed, affording medications, side effects of medications and questions about medications    Appointments: Saw primary MD Linthavong on 12/23/2018 follows up on 01/03/2019   Advance Directives: She has a living will and POA   Consent: [facility] RN CM reviewed [facility] services with patient. Patient gave verbal consent for services.   Advised patient that there will be further automated EMMI-post discharge calls to assess how the patient is doing following the recent hospitalization Advised the patient that another call may be received from a nurse if any of their responses were abnormal. Patient voiced understanding and was appreciative of f/u call.   Plan: [facility] RN CM will close case at this time as patient has been assessed and no needs  identified/needs resolved.   Pt encouraged to return a call to [facility] RN CM prn   [name], RN, BSN, CCM [facility] Telephonic Care Management Care Coordinator Office number [phone] Mobile number [phone]  Main [facility] number [phone] Fax number [phone]

## 2019-01-03 DIAGNOSIS — I6523 Occlusion and stenosis of bilateral carotid arteries: Secondary | ICD-10-CM | POA: Insufficient documentation

## 2019-01-14 ENCOUNTER — Encounter: Payer: Self-pay | Admitting: Physician Assistant

## 2019-01-14 ENCOUNTER — Other Ambulatory Visit: Payer: Self-pay | Admitting: *Deleted

## 2019-01-14 ENCOUNTER — Ambulatory Visit: Payer: Medicare Other | Admitting: Physician Assistant

## 2019-01-14 VITALS — BP 122/68 | HR 60 | Ht 62.0 in | Wt 179.8 lb

## 2019-01-14 DIAGNOSIS — I480 Paroxysmal atrial fibrillation: Secondary | ICD-10-CM

## 2019-01-14 DIAGNOSIS — I4891 Unspecified atrial fibrillation: Secondary | ICD-10-CM

## 2019-01-14 DIAGNOSIS — I10 Essential (primary) hypertension: Secondary | ICD-10-CM

## 2019-01-14 NOTE — Progress Notes (Signed)
Cardiology Office Note    Date:  01/14/2019   ID:  Emma Rivera, DOB 1941/07/23, MRN [id number]  PCP:  [name], MD  Cardiologist: Dr. Elease Hashimoto  Chief Complaint: Hospital follow up  History of Present Illness:   Emma Rivera is a 78 y.o. female history of hypertension, prediabetes with a recent stroke 12/2018 in setting of new onset atrial fibrillation presents for hospital follow up. She received TPA when she was found to have a stroke and has made a near complete recovery following her stroke. Remained in afib.  She cannot tell that her heart rate is irregular. Plan for outpatient cardioversion after 4 weeks of anticoagulation with Eliquis.   Per telephone note 12/23/18. Patient was not in afib at PCP office. Reduced Toprol to 12.5 due to slow rate. Restarted HCTZ for high BP.   Here today for follow up. She was in afib at reate of 84 bpm (reviewed ekg personally, will scan in system)when seen by PCP 01/03/19 prior to going to cruise. No issue while on vacation. In sinus rhythm today. She has severe fatigue. Seems no energy.  She has DOE since discharge. No change. She sometime tells when goes out of rhythm. She can feel irregular pulse at radial site. No chest pain, orthopnea, PND, syncope or melena. Compliant with medication. HR in 60-80s range.   Past Medical History:  Diagnosis Date  . Depression   . Esophageal spasm   . Esophageal spasm   . GERD (gastroesophageal reflux disease)   . Heart murmur   . HOH (hard of hearing)   . Hypertension   . Pre-diabetes     Past Surgical History:  Procedure Laterality Date  . CARDIAC CATHETERIZATION    . CATARACT EXTRACTION W/PHACO Left 09/09/2015   Procedure: CATARACT EXTRACTION PHACO AND INTRAOCULAR LENS PLACEMENT (IOC);  Surgeon: Lia Hopping, MD;  Location: [facility] ORS;  Service: Ophthalmology;  Laterality: Left;  [place]   1:10.5 AP     8.6 CDE  6.05 casette lot #  [id number]  . CATARACT EXTRACTION W/PHACO Right 09/30/2015   Procedure: CATARACT EXTRACTION PHACO AND INTRAOCULAR LENS PLACEMENT (IOC);  Surgeon: Lia Hopping, MD;  Location: [facility] ORS;  Service: Ophthalmology;  Laterality: Right;  [place] 01:00.3 AP%: 11.6 CDE: 7.00 FLUID LOT# [id number]  . COLONOSCOPY    . COLONOSCOPY WITH PROPOFOL N/A 04/04/2017   Procedure: COLONOSCOPY WITH PROPOFOL;  Surgeon: Scot Jun, MD;  Location[facility] END[facility]COPY;  Service: Endoscopy;  Laterality: N/A;  . TONSILLECTOMY      Current Medications: Prior to Admission medications   Medication Sig Start Date End Date Taking? Authorizing Provider  apixaban (ELIQUIS) 5 MG TABS tablet Take 1 tablet (5 mg total) by mouth 2 (two) times daily. 12/18/18   Layne Benton, NP  atorvastatin (LIPITOR) 20 MG tablet Take 1 tablet (20 mg total) by mouth daily at 6 PM. 12/18/18   Layne Benton, NP  citalopram (CELEXA) 20 MG tablet Take 20 mg by mouth daily.    [provider]  hydrochlorothiazide (HYDRODIURIL) 25 MG tablet Take 25 mg by mouth daily. 12/28/18   [provider]  losartan (COZAAR) 100 MG tablet Take 100 mg by mouth daily. 09/17/18   [provider]  metoprolol succinate (TOPROL-XL) 25 MG 24 hr tablet Take 1 tablet (25 mg total) by mouth daily. 12/19/18   Layne Benton, NP  Multiple Vitamin (MULTIVITAMIN) tablet Take 1 tablet by mouth daily.    [provider]  omeprazole (  PRILOSEC) 20 MG capsule Take 20 mg by mouth daily.    [provider]  tolterodine (DETROL LA) 2 MG 24 hr capsule Take 2 mg by mouth 2 (two) times daily.     [provider]    Allergies:   Sulfa antibiotics   Social History   Socioeconomic History  . Marital status: Married    Spouse name: Not on file  . Number of children: Not on file  . Years of education: Not on file  . Highest education level: Not on file  Occupational History  . Not on file  Social Needs  . Financial resource strain: Not on file  . Food insecurity:    Worry: Not on file     Inability: Not on file  . Transportation needs:    Medical: Not on file    Non-medical: Not on file  Tobacco Use  . Smoking status: Never Smoker  . Smokeless tobacco: Never Used  Substance and Sexual Activity  . Alcohol use: Yes    Comment: occasional  . Drug use: No  . Sexual activity: Not on file  Lifestyle  . Physical activity:    Days per week: Not on file    Minutes per session: Not on file  . Stress: Not on file  Relationships  . Social connections:    Talks on phone: Not on file    Gets together: Not on file    Attends religious service: Not on file    Active member of club or organization: Not on file    Attends meetings of clubs or organizations: Not on file    Relationship status: Not on file  Other Topics Concern  . Not on file  Social History Narrative  . Not on file     Family History:  The patient's family history includes Breast cancer in her maternal aunt.  ROS:   Please see the history of present illness.    ROS All other systems reviewed and are negative.   PHYSICAL EXAM:   VS:  BP 122/68   Pulse 60   Ht  (1.575 m)   Wt 179 lb 12.8 oz (81.6 kg)   SpO2 97%   BMI 32.89 kg/m    GEN: Well nourished, well developed, in no acute distress  HEENT: normal  Neck: no JVD, carotid bruits, or masses Cardiac: RRR; no murmurs, rubs, or gallops,no edema  Respiratory:  clear to auscultation bilaterally, normal work of breathing GI: soft, nontender, nondistended, + BS MS: no deformity or atrophy  Skin: warm and dry, no rash Neuro:  Alert and Oriented x 3, Strength and sensation are intact Psych: euthymic mood, full affect  Wt Readings from Last 3 Encounters:  01/14/19 179 lb 12.8 oz (81.6 kg)  12/17/18 184 lb 4.9 oz (83.6 kg)  12/16/18 175 lb (79.4 kg)      Studies/Labs Reviewed:   EKG:  EKG is ordered today.  The ekg ordered today demonstrates SR at rate of 61 bpm, PACs  Recent Labs: 12/16/2018: ALT 22; BUN 19; Creatinine, Ser 1.06; Hemoglobin  14.2; Platelets 215; Potassium 4.0; Sodium 138   Lipid Panel    Component Value Date/Time   CHOL 164 12/17/2018 0507   TRIG 54 12/17/2018 0507   HDL 56 12/17/2018 0507   CHOLHDL 2.9 12/17/2018 0507   VLDL 11 12/17/2018 0507   LDLCALC 97 12/17/2018 0507    Additional studies/ records that were reviewed today include:   Echocardiogram: 12/18/2018  1.  The left ventricle has normal systolic function of 60-65%. The cavity size is normal. There is no increased left ventricular wall thickness. Left ventricular diastology could not be evaluated secondary to atrial fibrillation.  2. The right ventricle has normal systolic function. The cavity in normal in size. There is no increase in right ventricular wall thickness.  3. Mildly dilated left atrial size.  4. The mitral valve is normal in structure There is mild thickening.  5. The aortic valve is tricuspid. There is mild thickening of the aortic valve.  6. Normal LV systolic function; mild TR.   ASSESSMENT & PLAN:    1. PAF -she was in afib 2/21. Sometime she can tell when goes out of rhythm. She is fatigue. Denies palpitations but lack of energy is main issue. Discussed antiarrhythmic if going in and out of rhythm frequently. Suspects this based on her symptoms. Will get 30 days monitor for further evaluation. Continue Toprol XL at 12.5mg  daily (reduced by PCP due to bradycardia). Now her HR ranges in 60-80s.  -  CHADSVASCs score of 6 for age, sex, HTN and stroke.  Continue Eliquis. No bleeding issue  2. HTN - BP stable on current medications  She likes to follow up at [facility] office as she lives there.   Medication Adjustments/Labs and Tests Ordered: Current medicines are reviewed at length with the patient today.  Concerns regarding medicines are outlined above.  Medication changes, Labs and Tests ordered today are listed in the Patient Instructions below. Patient Instructions  Medication Instructions:  Your physician recommends  that you continue on your current medications as directed. Please refer to the Current Medication list given to you today.  If you need a refill on your cardiac medications before your next appointment, please call your pharmacy.   Lab work: None ordered  If you have labs (blood work) drawn today and your tests are completely normal, you will receive your results only by: Marland Kitchen MyChart Message (if you have MyChart) OR . A paper copy in the mail If you have any lab test that is abnormal or we need to change your treatment, we will call you to review the results.  Testing/Procedures: Your physician has recommended that you wear an event monitor. Event monitors are medical devices that record the heart's electrical activity. Doctors most often [place] these monitors to diagnose arrhythmias. Arrhythmias are problems with the speed or rhythm of the heartbeat. The monitor is a small, portable device. You can wear one while you do your normal daily activities. This is usually used to diagnose what is causing palpitations/syncope (passing out).    Follow-Up: Your physician recommends that you schedule a follow-up appointment in: 6-8 WEEKS WITH 1ST AVAILABLE PROVIDER IN THE [place] OFFICE.  PREFERABLY DR. END   Any Other Special Instructions Will Be Listed Below (If Applicable).  Ambulatory Cardiac Monitoring An ambulatory cardiac monitor is a small recording device that is used to detect abnormal heart rhythms (arrhythmias). Most monitors are connected by wires to flat, sticky disks (electrodes) that are then attached to your chest. You may need to wear a monitor if you have had symptoms such as:  Fast heartbeats (palpitations).  Dizziness.  Fainting or light-headedness.  Unexplained weakness.  Shortness of breath. There are several types of monitors. Some common monitors include:  Holter monitor. This records your heart rhythm continuously, usually for 24-48 hours.  Event (episodic) monitor.  This monitor has a symptoms button, and when pushed, it will begin recording. You need  to activate this monitor to record when you have a heart-related symptom.  Automatic detection monitor. This monitor will begin recording when it detects an abnormal heartbeat. What are the risks? Generally, these devices are safe to use. However, it is possible that the skin under the electrodes will become irritated. How to prepare for monitoring Your health care provider will prepare your chest for the electrode placement and show you how to use the monitor.  Do not apply lotions to your chest before monitoring.  Follow directions on how to care for the monitor, and how to return the monitor when the testing period is complete. How to use your cardiac monitor  Follow directions about how long to wear the monitor, and if you can take the monitor off in order to shower or bathe. ? Do not let the monitor get wet. ? Do not bathe, swim, or use a hot tub while wearing the monitor.  Keep your skin clean. Do not put body lotion or moisturizer on your chest.  Change the electrodes as told by your health care provider, or any time they stop sticking to your skin. You may need to use medical tape to keep them on.  Try to put the electrodes in slightly different places on your chest to help prevent skin irritation. Follow directions from your health care provider about where to place the electrodes.  Make sure the monitor is safely clipped to your clothing or in a location close to your body as recommended by your health care provider.  If your monitor has a symptoms button, press the button to mark an event as soon as you feel a heart-related symptom, such as: ? Dizziness. ? Weakness. ? Light-headedness. ? Palpitations. ? Thumping or pounding in your chest. ? Shortness of breath. ? Unexplained weakness.  Keep a diary of your activities, such as walking, doing chores, and taking medicine. It is very  important to note what you were doing when you pushed the button to record your symptoms. This will help your health care provider determine what might be contributing to your symptoms.  Send the recorded information as recommended by your health care provider. It may take some time for your health care provider to process the results.  Change the batteries as told by your health care provider.  Keep electronic devices away from your monitor. These include: ? Tablets. ? MP3 players. ? Cell phones.  While wearing your monitor you should avoid: ? Electric blankets. ? Firefighter. ? Electric toothbrushes. ? Microwave ovens. ? Magnets. ? Metal detectors. Get help right away if:  You have chest pain.  You have shortness of breath or extreme difficulty breathing.  You develop a very fast heartbeat that does not get better.  You develop dizziness that does not go away.  You faint or constantly feel like you are about to faint. Summary  An ambulatory cardiac monitor is a small recording device that is used to detect abnormal heart rhythms (arrhythmias).  Make sure you understand how to send the information from the monitor to your health care provider.  It is important to press the button on the monitor when you have any heart-related symptoms.  Keep a diary of your activities, such as walking, doing chores, and taking medicine. It is very important to note what you were doing when you pushed the button to record your symptoms. This will help your health care provider learn what might be causing your symptoms. This information is not  intended to replace advice given to you by your health care provider. Make sure you discuss any questions you have with your health care provider. Document Released: 08/08/2008 Document Revised: 08/15/2017 Document Reviewed: 10/14/2016 Elsevier Interactive Patient Education  [address]       Signed, [place], [place]  01/14/2019  1:55 PM    [facility] [address] [place], [place], [place]  [address] Phone: [phone]; Fax: [phone]

## 2019-01-14 NOTE — Patient Instructions (Signed)
Medication Instructions:  Your physician recommends that you continue on your current medications as directed. Please refer to the Current Medication list given to you today.  If you need a refill on your cardiac medications before your next appointment, please call your pharmacy.   Lab work: None ordered  If you have labs (blood work) drawn today and your tests are completely normal, you will receive your results only by: Marland Kitchen MyChart Message (if you have MyChart) OR . A paper copy in the mail If you have any lab test that is abnormal or we need to change your treatment, we will call you to review the results.  Testing/Procedures: Your physician has recommended that you wear an event monitor. Event monitors are medical devices that record the heart's electrical activity. Doctors most often [place] these monitors to diagnose arrhythmias. Arrhythmias are problems with the speed or rhythm of the heartbeat. The monitor is a small, portable device. You can wear one while you do your normal daily activities. This is usually used to diagnose what is causing palpitations/syncope (passing out).    Follow-Up: Your physician recommends that you schedule a follow-up appointment in: 6-8 WEEKS WITH 1ST AVAILABLE PROVIDER IN THE [place] OFFICE.  PREFERABLY DR. END   Any Other Special Instructions Will Be Listed Below (If Applicable).  Ambulatory Cardiac Monitoring An ambulatory cardiac monitor is a small recording device that is used to detect abnormal heart rhythms (arrhythmias). Most monitors are connected by wires to flat, sticky disks (electrodes) that are then attached to your chest. You may need to wear a monitor if you have had symptoms such as:  Fast heartbeats (palpitations).  Dizziness.  Fainting or light-headedness.  Unexplained weakness.  Shortness of breath. There are several types of monitors. Some common monitors include:  Holter monitor. This records your heart rhythm continuously,  usually for 24-48 hours.  Event (episodic) monitor. This monitor has a symptoms button, and when pushed, it will begin recording. You need to activate this monitor to record when you have a heart-related symptom.  Automatic detection monitor. This monitor will begin recording when it detects an abnormal heartbeat. What are the risks? Generally, these devices are safe to use. However, it is possible that the skin under the electrodes will become irritated. How to prepare for monitoring Your health care provider will prepare your chest for the electrode placement and show you how to use the monitor.  Do not apply lotions to your chest before monitoring.  Follow directions on how to care for the monitor, and how to return the monitor when the testing period is complete. How to use your cardiac monitor  Follow directions about how long to wear the monitor, and if you can take the monitor off in order to shower or bathe. ? Do not let the monitor get wet. ? Do not bathe, swim, or use a hot tub while wearing the monitor.  Keep your skin clean. Do not put body lotion or moisturizer on your chest.  Change the electrodes as told by your health care provider, or any time they stop sticking to your skin. You may need to use medical tape to keep them on.  Try to put the electrodes in slightly different places on your chest to help prevent skin irritation. Follow directions from your health care provider about where to place the electrodes.  Make sure the monitor is safely clipped to your clothing or in a location close to your body as recommended by your health care  provider.  If your monitor has a symptoms button, press the button to mark an event as soon as you feel a heart-related symptom, such as: ? Dizziness. ? Weakness. ? Light-headedness. ? Palpitations. ? Thumping or pounding in your chest. ? Shortness of breath. ? Unexplained weakness.  Keep a diary of your activities, such as  walking, doing chores, and taking medicine. It is very important to note what you were doing when you pushed the button to record your symptoms. This will help your health care provider determine what might be contributing to your symptoms.  Send the recorded information as recommended by your health care provider. It may take some time for your health care provider to process the results.  Change the batteries as told by your health care provider.  Keep electronic devices away from your monitor. These include: ? Tablets. ? MP3 players. ? Cell phones.  While wearing your monitor you should avoid: ? Electric blankets. ? Firefighter. ? Electric toothbrushes. ? Microwave ovens. ? Magnets. ? Metal detectors. Get help right away if:  You have chest pain.  You have shortness of breath or extreme difficulty breathing.  You develop a very fast heartbeat that does not get better.  You develop dizziness that does not go away.  You faint or constantly feel like you are about to faint. Summary  An ambulatory cardiac monitor is a small recording device that is used to detect abnormal heart rhythms (arrhythmias).  Make sure you understand how to send the information from the monitor to your health care provider.  It is important to press the button on the monitor when you have any heart-related symptoms.  Keep a diary of your activities, such as walking, doing chores, and taking medicine. It is very important to note what you were doing when you pushed the button to record your symptoms. This will help your health care provider learn what might be causing your symptoms. This information is not intended to replace advice given to you by your health care provider. Make sure you discuss any questions you have with your health care provider. Document Released: 08/08/2008 Document Revised: 08/15/2017 Document Reviewed: 10/14/2016 Elsevier Interactive Patient Education  2019 [employer].

## 2019-01-21 ENCOUNTER — Encounter: Payer: Self-pay | Admitting: *Deleted

## 2019-01-21 ENCOUNTER — Ambulatory Visit (INDEPENDENT_AMBULATORY_CARE_PROVIDER_SITE_OTHER): Payer: Medicare Other

## 2019-01-21 ENCOUNTER — Other Ambulatory Visit: Payer: Self-pay | Admitting: *Deleted

## 2019-01-21 DIAGNOSIS — I4729 Other ventricular tachycardia: Secondary | ICD-10-CM

## 2019-01-21 DIAGNOSIS — I4891 Unspecified atrial fibrillation: Secondary | ICD-10-CM

## 2019-01-21 HISTORY — DX: Other ventricular tachycardia: I47.29

## 2019-01-21 NOTE — Progress Notes (Unsigned)
Patient to come in today for event monitor. Clarified with Ward Givens, NP that ok to order ZIO XT instead.

## 2019-02-03 ENCOUNTER — Telehealth: Payer: Self-pay | Admitting: Adult Health

## 2019-02-03 NOTE — Telephone Encounter (Signed)
I called pt back about her appt tomorrow. I stated due to th e COVID 19 epidemic we are severely reducing in office visits. We are offering telephone visits via phone with pts consent if they are stable. Pt stated she is feeling fine. Pt gave consent for telephone office visit and knows her insurance will be filed.

## 2019-02-03 NOTE — Telephone Encounter (Signed)
Pt is requesting a call regarding her appt tomorrow

## 2019-02-04 ENCOUNTER — Other Ambulatory Visit: Payer: Self-pay

## 2019-02-04 ENCOUNTER — Telehealth (INDEPENDENT_AMBULATORY_CARE_PROVIDER_SITE_OTHER): Payer: Medicare Other | Admitting: Adult Health

## 2019-02-04 DIAGNOSIS — E785 Hyperlipidemia, unspecified: Secondary | ICD-10-CM

## 2019-02-04 DIAGNOSIS — I480 Paroxysmal atrial fibrillation: Secondary | ICD-10-CM | POA: Diagnosis not present

## 2019-02-04 DIAGNOSIS — I63412 Cerebral infarction due to embolism of left middle cerebral artery: Secondary | ICD-10-CM

## 2019-02-04 DIAGNOSIS — I10 Essential (primary) hypertension: Secondary | ICD-10-CM | POA: Diagnosis not present

## 2019-02-04 NOTE — Progress Notes (Signed)
[facility] [address] [place]. [place] [address] [phone]     Virtual Visit via Telephone Note  I connected with [name] on 02/04/19 at  2:15 PM EDT by telephone located at [facility] and verified that I am speaking with the correct person using two identifiers who reports being located in her own home.    I discussed the limitations, risks, security and privacy concerns of performing an evaluation and management service by telephone and the availability of in person appointments. I also discussed with the patient that there may be a patient responsible charge related to this service. The patient expressed understanding and agreed to proceed.   History of Present Illness:  Emma Rivera is a 78 y.o. female who was initially scheduled for face-to-face office visit today at this time but due to COVID19, face-to-face office visit rescheduled for non-face-to-face telephone visit.   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 Fib.  Eliquis initiated for secondary stroke prevention and A. fib management with recommendation of following with cardiology outpatient.  Since she has been home, she states she has been doing well with complete resolution of aphasic symptoms and denies any recurrence or new stroke/TIA symptoms.  She has continued on Eliquis without side effects of bleeding or bruising.  Continues on atorvastatin without side effects myalgias.  She does monitor BP at home which has been stable.  She continues to follow with cardiology for PAF and Eliquis management and has completed 14-day cardiac monitor to assess for PAF burden.  She does report daytime fatigue, easily to nap and snores.  No further concerns at this time.  Denies new or worsening stroke/TIA symptoms.    Observations/Objective:  All imaging during admission including CT head, CT angio  head/neck, CT cerebral perfusion, 2D echo and MRI brain personally reviewed  LDL 97 A1c 5.7   Assessment and Plan:  [name] participated in telephone visit today with discussion in regards to recent left frontal infarct secondary to newly diagnosed A. fib.  She has been doing well since discharge without residual or recurrent symptoms.  1. Left frontal infarct: Continue Eliquis (apixaban) daily  and Lipitor for secondary stroke prevention. Maintain strict control of hypertension with blood pressure goal below 130/90, diabetes with hemoglobin A1c goal below 6.5% and cholesterol with LDL cholesterol (bad cholesterol) goal below 70 mg/dL.  I also advised the patient to eat a healthy diet with plenty of whole grains, cereals, fruits and vegetables, exercise regularly with at least 30 minutes of continuous activity daily and maintain ideal body weight. 2. PAF: Continue Eliquis along with ongoing follow-up with cardiologist for monitoring and management 3. HTN: Advised to continue current treatment regimen.  Advised to continue to monitor at home along with continued follow-up with PCP for management 4. HLD: Advised to continue current treatment regimen along with continued follow-up with PCP for future prescribing and monitoring of lipid panel 5. Potential OSA: She would like to hold off at this time and undergoing evaluation for potential sleep apnea willing to rediscuss at follow-up appointment.  Education regarding importance of treating sleep apnea for both risk factor management and symptom management    Follow Up Instructions:   She will follow-up in 3 to 4 months or call earlier if needed    I discussed the assessment and treatment plan with the patient.  The patient was provided an  opportunity to ask questions and all were answered to their satisfaction. The patient agreed with the plan and verbalized an understanding of the instructions.   I provided 20 minutes of non-face-to-face  time during this encounter.    George Hugh, AGNP-BC  [facility] [address] [place], [place] [address]  Phone [phone] Fax [phone] Note: This document was prepared with digital dictation and possible smart phrase technology. Any transcriptional errors that result from this process are unintentional.

## 2019-02-05 NOTE — Progress Notes (Signed)
Agree with above note

## 2019-02-11 ENCOUNTER — Telehealth: Payer: Self-pay | Admitting: Physician Assistant

## 2019-02-11 NOTE — Telephone Encounter (Signed)
Thanks for the update.  Yvonne Kendall, MD [facility] Pager: [phone]

## 2019-02-11 NOTE — Telephone Encounter (Signed)
Reviewed monitor result with patient. She denies any syncope or dizziness. Intermittent palpitations with chest pressure. Will not up-titrate BB given pause. Discuss possible evaluation with EP and may even require pacemaker. She will call us if worsening of symptoms.   She is agree with virtual visit with Dr. Okey Dupre next month. Will send consent.

## 2019-02-24 ENCOUNTER — Telehealth: Payer: Self-pay | Admitting: Internal Medicine

## 2019-02-24 NOTE — Telephone Encounter (Signed)
Virtual Visit Pre-Appointment Phone Call  Steps For Call:  1. Confirm consent - "In the setting of the current Covid19 crisis, you are scheduled for a (phone or video) visit with your provider on (date) at (time).  Just as we do with many in-office visits, in order for you to participate in this visit, we must obtain consent.  If you'd like, I can send this to your mychart (if signed up) or email for you to review.  Otherwise, I can obtain your verbal consent now.  All virtual visits are billed to your insurance company just like a normal visit would be.  By agreeing to a virtual visit, we'd like you to understand that the technology does not allow for your provider to perform an examination, and thus may limit your provider's ability to fully assess your condition.  Finally, though the technology is pretty good, we cannot assure that it will always work on either your or our end, and in the setting of a video visit, we may have to convert it to a phone-only visit.  In either situation, we cannot ensure that we have a secure connection.  Are you willing to proceed?"  2. Give patient instructions for WebEx download to smartphone as below if video visit  3. Advise patient to be prepared with any vital sign or heart rhythm information, their current medicines, and a piece of paper and pen handy for any instructions they may receive the day of their visit  4. Inform patient they will receive a phone call 15 minutes prior to their appointment time (may be from unknown caller ID) so they should be prepared to answer  5. Confirm that appointment type is correct in Epic appointment notes (video vs telephone)    TELEPHONE CALL NOTE  Emma Rivera has been deemed a candidate for a follow-up tele-health visit to limit community exposure during the Covid-19 pandemic. I spoke with the patient via phone to ensure availability of phone/video source, confirm preferred email & phone number, and discuss  instructions and expectations.  I reminded Emma Rivera to be prepared with any vital sign and/or heart rhythm information that could potentially be obtained via home monitoring, at the time of her visit. I reminded Emma Rivera to expect a phone call at the time of her visit if her visit.  Did the patient verbally acknowledge consent to treatment? Yes   Norman Herrlich 02/24/2019 4:10 PM   DOWNLOADING THE WEBEX SOFTWARE TO SMARTPHONE  - If Apple, go to [employer] and type in WebEx in the search bar. Download [employer], the blue/green circle. The app is free but as with any other app downloads, their phone may require them to verify saved payment information or Apple password. The patient does NOT have to create an account.  - If Android, ask patient to go to [employer] and type in WebEx in the search bar. Download [employer], the blue/green circle. The app is free but as with any other app downloads, their phone may require them to verify saved payment information or Android password. The patient does NOT have to create an account.   CONSENT FOR TELE-HEALTH VISIT - PLEASE REVIEW  I hereby voluntarily request, consent and authorize [facility] and its employed or contracted physicians, physician assistants, nurse practitioners or other licensed health care professionals (the Practitioner), to provide me with telemedicine health care services (the Services") as deemed necessary by the treating Practitioner. I  acknowledge and consent to receive the Services by the Practitioner via telemedicine. I understand that the telemedicine visit will involve communicating with the Practitioner through live audiovisual communication technology and the disclosure of certain medical information by electronic transmission. I acknowledge that I have been given the opportunity to request an in-person assessment or other available alternative prior to the telemedicine visit and am  voluntarily participating in the telemedicine visit.  I understand that I have the right to withhold or withdraw my consent to the use of telemedicine in the course of my care at any time, without affecting my right to future care or treatment, and that the Practitioner or I may terminate the telemedicine visit at any time. I understand that I have the right to inspect all information obtained and/or recorded in the course of the telemedicine visit and may receive copies of available information for a reasonable fee.  I understand that some of the potential risks of receiving the Services via telemedicine include:   Delay or interruption in medical evaluation due to technological equipment failure or disruption;  Information transmitted may not be sufficient (e.g. poor resolution of images) to allow for appropriate medical decision making by the Practitioner; and/or   In rare instances, security protocols could fail, causing a breach of personal health information.  Furthermore, I acknowledge that it is my responsibility to provide information about my medical history, conditions and care that is complete and accurate to the best of my ability. I acknowledge that Practitioner's advice, recommendations, and/or decision may be based on factors not within their control, such as incomplete or inaccurate data provided by me or distortions of diagnostic images or specimens that may result from electronic transmissions. I understand that the practice of medicine is not an exact science and that Practitioner makes no warranties or guarantees regarding treatment outcomes. I acknowledge that I will receive a copy of this consent concurrently upon execution via email to the email address I last provided but may also request a printed copy by calling the office of [place].    I understand that my insurance will be billed for this visit.   I have read or had this consent read to me.  I understand the  contents of this consent, which adequately explains the benefits and risks of the Services being provided via telemedicine.   I have been provided ample opportunity to ask questions regarding this consent and the Services and have had my questions answered to my satisfaction.  I give my informed consent for the services to be provided through the use of telemedicine in my medical care  By participating in this telemedicine visit I agree to the above.

## 2019-03-07 ENCOUNTER — Telehealth (INDEPENDENT_AMBULATORY_CARE_PROVIDER_SITE_OTHER): Payer: Medicare Other | Admitting: Internal Medicine

## 2019-03-07 ENCOUNTER — Encounter: Payer: Self-pay | Admitting: Internal Medicine

## 2019-03-07 ENCOUNTER — Other Ambulatory Visit: Payer: Self-pay

## 2019-03-07 VITALS — BP 139/89 | HR 73 | Ht 62.0 in | Wt 175.0 lb

## 2019-03-07 DIAGNOSIS — R0609 Other forms of dyspnea: Secondary | ICD-10-CM | POA: Diagnosis not present

## 2019-03-07 DIAGNOSIS — E785 Hyperlipidemia, unspecified: Secondary | ICD-10-CM | POA: Diagnosis not present

## 2019-03-07 DIAGNOSIS — I4819 Other persistent atrial fibrillation: Secondary | ICD-10-CM

## 2019-03-07 DIAGNOSIS — I10 Essential (primary) hypertension: Secondary | ICD-10-CM

## 2019-03-07 NOTE — Progress Notes (Addendum)
Virtual Visit via Telephone Note   This visit type was conducted due to national recommendations for restrictions regarding the COVID-19 Pandemic (e.g. social distancing) in an effort to limit this patient's exposure and mitigate transmission in our community.  Due to her co-morbid illnesses, this patient is at least at moderate risk for complications without adequate follow up.  This format is felt to be most appropriate for this patient at this time.  The patient did not have access to video technology/had technical difficulties with video requiring transitioning to audio format only (telephone).  All issues noted in this document were discussed and addressed.  No physical exam could be performed with this format.  Please refer to the patient's chart for her  consent to telehealth for [facility].   Evaluation Performed:  Follow-up visit  Date:  03/07/2019   ID:  Emma Rivera, DOB 12-Dec-1940, MRN [id number]  Patient Location: Home Provider Location: Office  PCP:  Emma Ivan, MD  Cardiologist: Previously seen by Nahser during hospitalization in 12/2018 Electrophysiologist:  None   Chief Complaint:  Shortness of breath  History of Present Illness:    Emma Rivera is a 78 y.o. female with recent stroke in 12/2018 with new diagnosis of atrial fibrillation, hypertension, GERD, and esophageal spasm.  She was admitted in early February with acute stroke.  She received TPA and was found to be in atrial fibrillation.  She was discharged on apixaban with plan for outpatient cardioversion if she remained in atrial fibrillation.  At her follow-up visit on 01/13/1929 with [facility], she was noted to be in sinus rhythm.  Subsequent monitor showed predominantly atrial fibrillation.  Today, Emma Rivera reports that she has been feeling quite well.  However, she notes considerable exertional dyspnea when going up the 10 steps in her house or walking for any length of time.  This was present before  her stroke in February, but has worsened.  She denies chest pain, orthopnea, and edema.  She has rare palpitations.  She also notes a single episode of lightheadedness that lasted <1 second and was without associated symptoms.  Emma Rivera also reports hair loss since her stroke.  New medications include apixaban, atorvastatin, and metoprolol.  Of note, metoprolol was decreased from 25 mg daily to 12.5 mg daily due to lethargy.  This has resolved.  She remains on apixaban without significant bleeding.  Blood pressure has ranged 114/79-142/98.  Her pulse remains irregularly but is usually 70-80 bpm when at rest.  The patient does not have symptoms concerning for COVID-19 infection (fever, chills, cough, or new shortness of breath).    Past Medical History:  Diagnosis Date   Atrial fibrillation (HCC)    Depression    Esophageal spasm    Esophageal spasm    GERD (gastroesophageal reflux disease)    Heart murmur    HOH (hard of hearing)    Hypertension    Pre-diabetes    Stroke [facility])    Past Surgical History:  Procedure Laterality Date   CARDIAC CATHETERIZATION     CATARACT EXTRACTION W/PHACO Left 09/09/2015   Procedure: CATARACT EXTRACTION PHACO AND INTRAOCULAR LENS PLACEMENT (IOC);  Surgeon: Lia Hopping, MD;  Location: [facility] ORS;  Service: Ophthalmology;  Laterality: Left;  [place]   1:10.5 AP     8.6 CDE  6.05 casette lot #  [id number]   CATARACT EXTRACTION W/PHACO Right 09/30/2015   Procedure: CATARACT EXTRACTION PHACO AND INTRAOCULAR LENS PLACEMENT (IOC);  Surgeon: Lia Hopping,  MD;  Location: [facility] ORS;  Service: Ophthalmology;  Laterality: Right;  [place] 01:00.3 AP%: 11.6 CDE: 7.00 FLUID LOT# [id number]   COLONOSCOPY     COLONOSCOPY WITH PROPOFOL N/A 04/04/2017   Procedure: COLONOSCOPY WITH PROPOFOL;  Surgeon: Scot Jun, MD;  Location: [facility] ENDOSCOPY;  Service: Endoscopy;  Laterality: N/A;   TONSILLECTOMY       Current Meds  Medication Sig   apixaban  (ELIQUIS) 5 MG TABS tablet Take 1 tablet (5 mg total) by mouth 2 (two) times daily.   atorvastatin (LIPITOR) 20 MG tablet Take 1 tablet (20 mg total) by mouth daily at 6 PM.   citalopram (CELEXA) 20 MG tablet Take 20 mg by mouth daily.   hydrochlorothiazide (HYDRODIURIL) 25 MG tablet Take 25 mg by mouth daily.   losartan (COZAAR) 100 MG tablet Take 100 mg by mouth daily.   metoprolol succinate (TOPROL-XL) 25 MG 24 hr tablet Take 12.5 mg by mouth daily.   Multiple Vitamin (MULTIVITAMIN) tablet Take 1 tablet by mouth daily.   omeprazole (PRILOSEC) 20 MG capsule Take 20 mg by mouth daily.   tolterodine (DETROL LA) 2 MG 24 hr capsule Take 2 mg by mouth 2 (two) times daily.      Allergies:   Sulfa antibiotics   Social History   Tobacco Use   Smoking status: Never Smoker   Smokeless tobacco: Never Used  Substance Use Topics   Alcohol use: Yes    Comment: occasional   Drug use: No     Family Hx: The patient's family history includes Breast cancer in her maternal aunt.  ROS:   Please see the history of present illness.   All other systems reviewed and are negative.   Prior CV studies:   The following studies were reviewed today:  14-day event monitor (01/21/2019): Predominantly atrial fibrillation with two post-termination pauses lasting up to 4.6 seconds, as well as two episodes of likely NSVT lasting up to 18 beats.  TTE (12/18/2018):  1. The left ventricle has normal systolic function of 60-65%. The cavity size is normal. There is no increased left ventricular wall thickness. Left ventricular diastology could not be evaluated secondary to atrial fibrillation.  2. The right ventricle has normal systolic function. The cavity in normal in size. There is no increase in right ventricular wall thickness.  3. Mildly dilated left atrial size.  4. The mitral valve is normal in structure There is mild thickening.  5. The aortic valve is tricuspid. There is mild thickening of the  aortic valve.  6. Normal LV systolic function; mild TR.  Labs/Other Tests and Data Reviewed:    EKG:  An ECG dated 01/14/2019 was personally reviewed today and demonstrated:  Normal sinus rhythm with PACs and rightward axis.  Otherwise, no significant abnormality.Normal sinus rhythm with PAC's and rightward axis.  Otherwise, no significant abnormality.  Recent Labs: 12/16/2018: ALT 22; BUN 19; Creatinine, Ser 1.06; Hemoglobin 14.2; Platelets 215; Potassium 4.0; Sodium 138   Recent Lipid Panel Lab Results  Component Value Date/Time   CHOL 164 12/17/2018 05:07 AM   TRIG 54 12/17/2018 05:07 AM   HDL 56 12/17/2018 05:07 AM   CHOLHDL 2.9 12/17/2018 05:07 AM   LDLCALC 97 12/17/2018 05:07 AM    Wt Readings from Last 3 Encounters:  03/07/19 175 lb (79.4 kg)  01/14/19 179 lb 12.8 oz (81.6 kg)  12/17/18 184 lb 4.9 oz (83.6 kg)     Objective:    Vital Signs:  BP 139/89 (BP  Location: Left Arm, Patient Position: Sitting, Cuff Size: Normal)    Pulse 73    Ht 5\' 2"  (1.575 m)    Wt 175 lb (79.4 kg)    BMI 32.01 kg/m    VITAL SIGNS:  reviewed GEN:  no acute distress (patient seen briefly by video feed before audio connection was lost and visit was converted to telephone).  ASSESSMENT & PLAN:    Persistent atrial fibrillation: Recent monitor showed 99% atrial fibrillation burden, though at least two spontaneous conversions to sinus rhythm with associated offset pauses lasting up to 4.6 seconds were noted.  I am concerned that worsening dyspnea on exertion is a manifestation of atrial fibrillation.  Given that she has converted in the past without maintenance of sinus rhythm, I think that she would benefit from antiarrhythmic therapy.  I will refer her to EP for recommendations.  In the meantime, we will continue current doses of apixaban and metoprolol.  I do not see that a TSH has ever been checked (no value in our system or Care Everywhere).  Given a-fib and recent hair loss, this should be checked  the next time that the patient presents for in-person visit/testing.  Dyspnea on exertion: Most likely due to atrial fibrillation.  LVEF was normal by echo in February.  Underlying CAD is a possibility, though Ms. [name] denies chest pain.  We will manage a-fib, as above.  I have recommended a myocardial perfusion stress test, though I think this would be most helpful after sinus rhythm has been restored.  Hypertension: Blood pressure adequately controlled.  Continue current medications.  Hyperlipidemia: Continue atorvastatin for secondary prevention, with history of stroke.  I will defer ongoing monitoring and management to Ms. [name]' PCP.  COVID-19 Education: The signs and symptoms of COVID-19 were discussed with the patient and how to seek care for testing (follow up with PCP or arrange E-visit).  The importance of social distancing was discussed today.  Time:   Today, I have spent 15 minutes with the patient with telehealth technology discussing the above problems.  An additional 10 minutes were spent reviewing the patient's chart and documenting today's encounter.   Medication Adjustments/Labs and Tests Ordered: Current medicines are reviewed at length with the patient today.  Concerns regarding medicines are outlined above.   Tests Ordered: Referral to electrophysiology.  Medication Changes: None.  Disposition:  Follow up in 3 month(s)  Signed, Yvonne Kendallhristopher Nehemyah Foushee, MD  03/07/2019 9:36 AM    [facility]

## 2019-03-07 NOTE — Patient Instructions (Signed)
Medication Instructions:  Your physician recommends that you continue on your current medications as directed. Please refer to the Current Medication list given to you today.  If you need a refill on your cardiac medications before your next appointment, please call your pharmacy.   Lab work: none If you have labs (blood work) drawn today and your tests are completely normal, you will receive your results only by: Marland Kitchen MyChart Message (if you have MyChart) OR . A paper copy in the mail If you have any lab test that is abnormal or we need to change your treatment, we will call you to review the results.  Testing/Procedures: none  Follow-Up: At [facility], you and your health needs are our priority.  As part of our continuing mission to provide you with exceptional heart care, we have created designated Provider Care Teams.  These Care Teams include your primary Cardiologist (physician) and Advanced Practice Providers (APPs -  Physician Assistants and Nurse Practitioners) who all work together to provide you with the care you need, when you need it. You will need a follow up appointment in 3 months.  Please call our office 2 months in advance to schedule this appointment.  You may see DR Cristal Deer END or one of the following Advanced Practice Providers on your designated Care Team:   Nicolasa Ducking, NP Eula Listen, PA-C . [name], PA-C    You have been referred to Dr Graciela Husbands with Electrophysiology in the next 1-2 weeks. Someone will call you to schedule this appointment. If you do not receive a call, please call us at [phone].

## 2019-03-10 ENCOUNTER — Telehealth: Payer: Self-pay

## 2019-03-10 ENCOUNTER — Telehealth: Payer: Self-pay | Admitting: *Deleted

## 2019-03-10 NOTE — Telephone Encounter (Signed)
Spoke with patient concerning her appointment wit Dr. Elberta Fortis she will keep that appointment which is scheduled for 03/11/2019.

## 2019-03-10 NOTE — Telephone Encounter (Signed)
[name] pt to call reference an appointment with Graciela Husbands 4/30 8:30, if possible

## 2019-03-10 NOTE — Telephone Encounter (Signed)
Virtual Visit Pre-Appointment Phone Call  "(Name), I am calling you today to discuss your upcoming appointment. We are currently trying to limit exposure to the virus that causes COVID-19 by seeing patients at home rather than in the office."  1. "What is the BEST phone number to call the day of the visit?" - include this in appointment notes  2. "Do you have or have access to (through a family member/friend) a smartphone with video capability that we can use for your visit?" a. If yes - list this number in appt notes as "cell" (if different from BEST phone #) and list the appointment type as a VIDEO visit in appointment notes b. If no - list the appointment type as a PHONE visit in appointment notes  3. Confirm consent - "In the setting of the current Covid19 crisis, you are scheduled for a (phone or video) visit with your provider on (date) at (time).  Just as we do with many in-office visits, in order for you to participate in this visit, we must obtain consent.  If you'd like, I can send this to your mychart (if signed up) or email for you to review.  Otherwise, I can obtain your verbal consent now.  All virtual visits are billed to your insurance company just like a normal visit would be.  By agreeing to a virtual visit, we'd like you to understand that the technology does not allow for your provider to perform an examination, and thus may limit your provider's ability to fully assess your condition. If your provider identifies any concerns that need to be evaluated in person, we will make arrangements to do so.  Finally, though the technology is pretty good, we cannot assure that it will always work on either your or our end, and in the setting of a video visit, we may have to convert it to a phone-only visit.  In either situation, we cannot ensure that we have a secure connection.  Are you willing to proceed?" STAFF: Did the patient verbally acknowledge consent to telehealth visit? Document  YES/NO here: YES  4. Advise patient to be prepared - "Two hours prior to your appointment, go ahead and check your blood pressure, pulse, oxygen saturation, and your weight (if you have the equipment to check those) and write them all down. When your visit starts, your provider will ask you for this information. If you have an Apple Watch or Kardia device, please plan to have heart rate information ready on the day of your appointment. Please have a pen and paper handy nearby the day of the visit as well."  5. Give patient instructions for MyChart download to smartphone OR [email or web address] as below if video visit (depending on what platform provider is using)  6. Inform patient they will receive a phone call 15 minutes prior to their appointment time (may be from unknown caller ID) so they should be prepared to answer    TELEPHONE CALL NOTE  Emma Rivera has been deemed a candidate for a follow-up tele-health visit to limit community exposure during the Covid-19 pandemic. I spoke with the patient via phone to ensure availability of phone/video source, confirm preferred email & phone number, and discuss instructions and expectations.  I reminded Emma LeepAnn B Rivera to be prepared with any vital sign and/or heart rhythm information that could potentially be obtained via home monitoring, at the time of her visit. I reminded Emma LeepAnn B Rivera to expect a phone call prior to  her visit.  Baird Lyons, RN 03/10/2019 12:30 PM   INSTRUCTIONS FOR DOWNLOADING THE MYCHART APP TO SMARTPHONE  - The patient must first make sure to have activated MyChart and know their login information - If Apple, go to [employer] and type in MyChart in the search bar and download the app. If Android, ask patient to go to [employer] and type in [place] in the search bar and download the app. The app is free but as with any other app downloads, their phone may require them to verify saved payment information or Apple/Android  password.  - The patient will need to then log into the app with their MyChart username and password, and select [place] as their healthcare provider to link the account. When it is time for your visit, go to the MyChart app, find appointments, and click Begin Video Visit. Be sure to Select Allow for your device to access the Microphone and Camera for your visit. You will then be connected, and your provider will be with you shortly.  **If they have any issues connecting, or need assistance please contact MyChart service desk [phone])**  **If using a computer, in order to ensure the best quality for their visit they will need to use either of the following Internet Browsers: [employer] or Google Chrome**  IF USING DOXIMITY or DOXY.ME - The patient will receive a link just prior to their visit by text.     FULL LENGTH CONSENT FOR TELE-HEALTH VISIT   I hereby voluntarily request, consent and authorize [facility] and its employed or contracted physicians, physician assistants, nurse practitioners or other licensed health care professionals (the Practitioner), to provide me with telemedicine health care services (the "Services") as deemed necessary by the treating Practitioner. I acknowledge and consent to receive the Services by the Practitioner via telemedicine. I understand that the telemedicine visit will involve communicating with the Practitioner through live audiovisual communication technology and the disclosure of certain medical information by electronic transmission. I acknowledge that I have been given the opportunity to request an in-person assessment or other available alternative prior to the telemedicine visit and am voluntarily participating in the telemedicine visit.  I understand that I have the right to withhold or withdraw my consent to the use of telemedicine in the course of my care at any time, without affecting my right to future care or treatment,  and that the Practitioner or I may terminate the telemedicine visit at any time. I understand that I have the right to inspect all information obtained and/or recorded in the course of the telemedicine visit and may receive copies of available information for a reasonable fee.  I understand that some of the potential risks of receiving the Services via telemedicine include:  Marland Kitchen Delay or interruption in medical evaluation due to technological equipment failure or disruption; . Information transmitted may not be sufficient (e.g. poor resolution of images) to allow for appropriate medical decision making by the Practitioner; and/or  . In rare instances, security protocols could fail, causing a breach of personal health information.  Furthermore, I acknowledge that it is my responsibility to provide information about my medical history, conditions and care that is complete and accurate to the best of my ability. I acknowledge that Practitioner's advice, recommendations, and/or decision may be based on factors not within their control, such as incomplete or inaccurate data provided by me or distortions of diagnostic images or specimens that may result from electronic transmissions.  I understand that the practice of medicine is not an exact science and that Practitioner makes no warranties or guarantees regarding treatment outcomes. I acknowledge that I will receive a copy of this consent concurrently upon execution via email to the email address I last provided but may also request a printed copy by calling the office of [place].    I understand that my insurance will be billed for this visit.   I have read or had this consent read to me. . I understand the contents of this consent, which adequately explains the benefits and risks of the Services being provided via telemedicine.  . I have been provided ample opportunity to ask questions regarding this consent and the Services and have had my questions  answered to my satisfaction. . I give my informed consent for the services to be provided through the use of telemedicine in my medical care  By participating in this telemedicine visit I agree to the above.

## 2019-03-11 ENCOUNTER — Other Ambulatory Visit: Payer: Self-pay

## 2019-03-11 ENCOUNTER — Encounter: Payer: Self-pay | Admitting: Cardiology

## 2019-03-11 ENCOUNTER — Other Ambulatory Visit: Payer: Self-pay | Admitting: Cardiology

## 2019-03-11 ENCOUNTER — Telehealth (INDEPENDENT_AMBULATORY_CARE_PROVIDER_SITE_OTHER): Payer: Medicare Other | Admitting: Cardiology

## 2019-03-11 DIAGNOSIS — I4819 Other persistent atrial fibrillation: Secondary | ICD-10-CM

## 2019-03-11 MED ORDER — DILTIAZEM HCL ER COATED BEADS 120 MG PO CP24: 120.0000 mg | ORAL_CAPSULE | Freq: Every day | ORAL | 3 refills | Status: DC

## 2019-03-11 MED ORDER — FLECAINIDE ACETATE 50 MG PO TABS: 50.0000 mg | ORAL_TABLET | Freq: Two times a day (BID) | ORAL | 3 refills | Status: DC

## 2019-03-11 NOTE — H&P (View-Only) (Signed)
Electrophysiology TeleHealth Note   Due to national recommendations of social distancing due to COVID 19, an audio/video telehealth visit is felt to be most appropriate for this patient at this time.  See Epic message for the patient's consent to telehealth for [facility].   Date:  03/11/2019   ID:  Emma Leepnn B Mcphearson, DOB 04-Apr-1941, MRN [id number]  Location: patient's home  Provider location: [address], [place]  Evaluation Performed: Follow-up visit  PCP:  Marisue IvanLinthavong, Kanhka, MD  Cardiologist:  End Electrophysiologist:  Dr Elberta Fortisamnitz  Chief Complaint:  Atrial fibrillaiton  History of Present Illness:    Emma Rivera is a 78 y.o. female who presents via audio/video conferencing for a telehealth visit today.  Since last being seen in our clinic, the patient reports doing very well.  Today, she denies symptoms of palpitations, chest pain, shortness of breath,  lower extremity edema, dizziness, presyncope, or syncope.  The patient is otherwise without complaint today.  The patient denies symptoms of fevers, chills, cough, or new SOB worrisome for COVID 19.  Emma Rivera is a 78 y.o. female who is being seen today for the evaluation of atrial fibrillation at the request of End, Cristal Deerhristopher, MD.  She has a history of atrial fibrillation, hypertension, GERD, and esophageal spasm.  She was admitted to hospital in February with a stroke.  She received TPA.  At that time she was found to be in atrial fibrillation and was discharged on Eliquis.  She had follow-up 02/10/2019 and was noted to be in sinus rhythm.  Subsequent monitoring showed atrial fibrillation.  Unfortunately, she does get short of breath.  This occurs with walking around the house for any length of time.  Prior to her stroke in February, this had worsened.  Today, denies symptoms of palpitations, chest pain, orthopnea, PND, lower extremity edema, claudication, dizziness, presyncope, syncope, bleeding, or neurologic  sequela. The patient is tolerating medications without difficulties.  Her main complaint continues to be shortness of breath.  She does not have any prior cardiac history and thus it is likely that her shortness of breath is due to atrial fibrillation.  Past Medical History:  Diagnosis Date  . Atrial fibrillation (HCC)   . Depression   . Esophageal spasm   . Esophageal spasm   . GERD (gastroesophageal reflux disease)   . Heart murmur   . HOH (hard of hearing)   . Hypertension   . Pre-diabetes   . Stroke [facility](HCC)     Past Surgical History:  Procedure Laterality Date  . CARDIAC CATHETERIZATION    . CATARACT EXTRACTION W/PHACO Left 09/09/2015   Procedure: CATARACT EXTRACTION PHACO AND INTRAOCULAR LENS PLACEMENT (IOC);  Surgeon: Lia HoppingNisha Mukherjee, MD;  Location: [facility];  Service: Ophthalmology;  Laterality: Left;  US   1:10.5 AP     8.6 CDE  6.05 casette lot #  [id number]  . CATARACT EXTRACTION W/PHACO Right 09/30/2015   Procedure: CATARACT EXTRACTION PHACO AND INTRAOCULAR LENS PLACEMENT (IOC);  Surgeon: Lia HoppingNisha Mukherjee, MD;  Location: [facility];  Service: Ophthalmology;  Laterality: Right;  US 01:00.3 AP%: 11.6 CDE: 7.00 FLUID LOT# [id number]  . COLONOSCOPY    . COLONOSCOPY WITH PROPOFOL N/A 04/04/2017   Procedure: COLONOSCOPY WITH PROPOFOL;  Surgeon: Scot JunElliott, Robert T, MD;  [facility];  Service: Endoscopy;  Laterality: N/A;  . TONSILLECTOMY      Current Outpatient Medications  Medication Sig Dispense Refill  . apixaban (ELIQUIS) 5 MG TABS tablet Take  1 tablet (5 mg total) by mouth 2 (two) times daily. 60 tablet 0  . atorvastatin (LIPITOR) 20 MG tablet Take 1 tablet (20 mg total) by mouth daily at 6 PM. 30 tablet 2  . citalopram (CELEXA) 20 MG tablet Take 20 mg by mouth daily.    . hydrochlorothiazide (HYDRODIURIL) 25 MG tablet Take 25 mg by mouth daily.    Marland Kitchen losartan (COZAAR) 100 MG tablet Take 100 mg by mouth daily.    . metoprolol succinate (TOPROL-XL) 25 MG 24 hr  tablet Take 12.5 mg by mouth daily.    . Multiple Vitamin (MULTIVITAMIN) tablet Take 1 tablet by mouth daily.    Marland Kitchen omeprazole (PRILOSEC) 20 MG capsule Take 20 mg by mouth daily.    Marland Kitchen tolterodine (DETROL LA) 2 MG 24 hr capsule Take 2 mg by mouth 2 (two) times daily.      No current facility-administered medications for this visit.     Allergies:   Sulfa antibiotics   Social History:  The patient  reports that she has never smoked. She has never used smokeless tobacco. She reports current alcohol use. She reports that she does not use drugs.   Family History:  The patient's  family history includes Breast cancer in her maternal aunt.   ROS:  Please see the history of present illness.   All other systems are personally reviewed and negative.    Exam:    Vital Signs:  BP 128/83   Pulse 80   Well appearing, alert and conversant, regular work of breathing,  good skin color Eyes- anicteric, neuro- grossly intact, skin- no apparent rash or lesions or cyanosis, mouth- oral mucosa is pink   Labs/Other Tests and Data Reviewed:    Recent Labs: 12/16/2018: ALT 22; BUN 19; Creatinine, Ser 1.06; Hemoglobin 14.2; Platelets 215; Potassium 4.0; Sodium 138   Wt Readings from Last 3 Encounters:  03/07/19 175 lb (79.4 kg)  01/14/19 179 lb 12.8 oz (81.6 kg)  12/17/18 184 lb 4.9 oz (83.6 kg)     Other studies personally reviewed: Additional studies/ records that were reviewed today include: TTE 12/18/18  Review of the above records today demonstrates:    1. The left ventricle has normal systolic function of 60-65%. The cavity size is normal. There is no increased left ventricular wall thickness. Left ventricular diastology could not be evaluated secondary to atrial fibrillation.  2. The right ventricle has normal systolic function. The cavity in normal in size. There is no increase in right ventricular wall thickness.  3. Mildly dilated left atrial size.  4. The mitral valve is normal in structure  There is mild thickening.  5. The aortic valve is tricuspid. There is mild thickening of the aortic valve.  6. Normal LV systolic function; mild TR.  ECG personally reviewed 01/14/19 SR, PACs  Monitor 02/10/19 personally reviewed Predominantly atrial fibrillation with two post-termination pauses lasting up to 4.6 seconds, as well as two episodes of likely NSVT lasting up to 18 beats.   ASSESSMENT & PLAN:    1.  Persistent atrial fibrillation: Monitor shows 99% burden.  She converted to sinus rhythm twice with pauses of up to 4.6 seconds.  It is certainly possible that her dyspnea on exertion is related to her atrial fibrillation.  Currently on Eliquis.  She is symptomatic from her atrial fibrillation.  We Tyron Manetta just use a rhythm control strategy.  We Kamela Blansett plan to start her on 50 mg of flecainide twice a day.  If  she does not convert to normal rhythm, she Milissa Fesperman likely need a cardioversion.  We Zulema Pulaski plan for that potentially next week.  She is also having some hair loss with her metoprolol.  We Nasire Reali stop that and start her on 120 mg of diltiazem.  If flecainide does not control her atrial fibrillation, she would be a candidate for ablation.  This patients CHA2DS2-VASc Score and unadjusted Ischemic Stroke Rate (% per year) is equal to 7.2 % stroke rate/year from a score of 5  Above score calculated as 1 point each if present [CHF, HTN, DM, Vascular=MI/PAD/Aortic Plaque, Age if 65-74, or Female] Above score calculated as 2 points each if present [Age > 75, or Stroke/TIA/TE]  2.  Dyspnea on exertion: Likely due to atrial fibrillation.  She has had a normal echo in the past.  We Lavonya Hoerner plan for a rhythm control strategy.  3.  Hypertension: Currently well controlled  4.  Hyperlipidemia: Continue atorvastatin   COVID 19 screen The patient denies symptoms of COVID 19 at this time.  The importance of social distancing was discussed today.  Follow-up: 3 months  Current medicines are reviewed at length  with the patient today.   The patient does not have concerns regarding her medicines.  The following changes were made today:  none  Labs/ tests ordered today include:  No orders of the defined types were placed in this encounter.  Case discussed with primary cardiology  Patient Risk:  after full review of this patients clinical status, I feel that they are at moderate risk at this time.  Today, I have spent 15 minutes with the patient with telehealth technology discussing atrial fibrillation.    Signed, Nishanth Mccaughan Jorja Loa, MD  03/11/2019 11:31 AM     [facility] [address] [place] [address] [phone] (office) [phone] (fax)

## 2019-03-11 NOTE — Addendum Note (Signed)
Addended by: Baird Lyons on: 03/11/2019 12:35 PM   Modules accepted: Orders

## 2019-03-11 NOTE — Progress Notes (Signed)
Electrophysiology TeleHealth Note   Due to national recommendations of social distancing due to COVID 19, an audio/video telehealth visit is felt to be most appropriate for this patient at this time.  See Epic message for the patient's consent to telehealth for [facility].   Date:  03/11/2019   ID:  Emma Rivera, DOB 04-Apr-1941, MRN [id number]  Location: patient's home  Provider location: [address], [place]  Evaluation Performed: Follow-up visit  PCP:  Marisue IvanLinthavong, Kanhka, MD  Cardiologist:  End Electrophysiologist:  Dr Elberta Fortisamnitz  Chief Complaint:  Atrial fibrillaiton  History of Present Illness:    Emma Rivera is a 78 y.o. female who presents via audio/video conferencing for a telehealth visit today.  Since last being seen in our clinic, the patient reports doing very well.  Today, she denies symptoms of palpitations, chest pain, shortness of breath,  lower extremity edema, dizziness, presyncope, or syncope.  The patient is otherwise without complaint today.  The patient denies symptoms of fevers, chills, cough, or new SOB worrisome for COVID 19.  Emma Rivera is a 78 y.o. female who is being seen today for the evaluation of atrial fibrillation at the request of End, Cristal Deerhristopher, MD.  She has a history of atrial fibrillation, hypertension, GERD, and esophageal spasm.  She was admitted to hospital in February with a stroke.  She received TPA.  At that time she was found to be in atrial fibrillation and was discharged on Eliquis.  She had follow-up 02/10/2019 and was noted to be in sinus rhythm.  Subsequent monitoring showed atrial fibrillation.  Unfortunately, she does get short of breath.  This occurs with walking around the house for any length of time.  Prior to her stroke in February, this had worsened.  Today, denies symptoms of palpitations, chest pain, orthopnea, PND, lower extremity edema, claudication, dizziness, presyncope, syncope, bleeding, or neurologic  sequela. The patient is tolerating medications without difficulties.  Her main complaint continues to be shortness of breath.  She does not have any prior cardiac history and thus it is likely that her shortness of breath is due to atrial fibrillation.  Past Medical History:  Diagnosis Date  . Atrial fibrillation (HCC)   . Depression   . Esophageal spasm   . Esophageal spasm   . GERD (gastroesophageal reflux disease)   . Heart murmur   . HOH (hard of hearing)   . Hypertension   . Pre-diabetes   . Stroke [facility](HCC)     Past Surgical History:  Procedure Laterality Date  . CARDIAC CATHETERIZATION    . CATARACT EXTRACTION W/PHACO Left 09/09/2015   Procedure: CATARACT EXTRACTION PHACO AND INTRAOCULAR LENS PLACEMENT (IOC);  Surgeon: Lia HoppingNisha Mukherjee, MD;  Location: [facility] OR[facility];  Service: Ophthalmology;  Laterality: Left;  US   1:10.5 AP     8.6 CDE  6.05 casette lot #  [id number]  . CATARACT EXTRACTION W/PHACO Right 09/30/2015   Procedure: CATARACT EXTRACTION PHACO AND INTRAOCULAR LENS PLACEMENT (IOC);  Surgeon: Lia HoppingNisha Mukherjee, MD;  Location: [facility];  Service: Ophthalmology;  Laterality: Right;  US 01:00.3 AP%: 11.6 CDE: 7.00 FLUID LOT# [id number] H  . COLONOSCOPY    . COLONOSCOPY WITH PROPOFOL N/A 04/04/2017   Procedure: COLONOSCOPY WITH PROPOFOL;  Surgeon: Scot JunElliott, Robert T, MD;  Location[facility];  Service: Endoscopy;  Laterality: N/A;  . TONSILLECTOMY      Current Outpatient Medications  Medication Sig Dispense Refill  . apixaban (ELIQUIS) 5 MG TABS tablet Take  1 tablet (5 mg total) by mouth 2 (two) times daily. 60 tablet 0  . atorvastatin (LIPITOR) 20 MG tablet Take 1 tablet (20 mg total) by mouth daily at 6 PM. 30 tablet 2  . citalopram (CELEXA) 20 MG tablet Take 20 mg by mouth daily.    . hydrochlorothiazide (HYDRODIURIL) 25 MG tablet Take 25 mg by mouth daily.    Marland Kitchen losartan (COZAAR) 100 MG tablet Take 100 mg by mouth daily.    . metoprolol succinate (TOPROL-XL) 25 MG 24 hr  tablet Take 12.5 mg by mouth daily.    . Multiple Vitamin (MULTIVITAMIN) tablet Take 1 tablet by mouth daily.    Marland Kitchen omeprazole (PRILOSEC) 20 MG capsule Take 20 mg by mouth daily.    Marland Kitchen tolterodine (DETROL LA) 2 MG 24 hr capsule Take 2 mg by mouth 2 (two) times daily.      No current facility-administered medications for this visit.     Allergies:   Sulfa antibiotics   Social History:  The patient  reports that she has never smoked. She has never used smokeless tobacco. She reports current alcohol use. She reports that she does not use drugs.   Family History:  The patient's  family history includes Breast cancer in her maternal aunt.   ROS:  Please see the history of present illness.   All other systems are personally reviewed and negative.    Exam:    Vital Signs:  BP 128/83   Pulse 80   Well appearing, alert and conversant, regular work of breathing,  good skin color Eyes- anicteric, neuro- grossly intact, skin- no apparent rash or lesions or cyanosis, mouth- oral mucosa is pink   Labs/Other Tests and Data Reviewed:    Recent Labs: 12/16/2018: ALT 22; BUN 19; Creatinine, Ser 1.06; Hemoglobin 14.2; Platelets 215; Potassium 4.0; Sodium 138   Wt Readings from Last 3 Encounters:  03/07/19 175 lb (79.4 kg)  01/14/19 179 lb 12.8 oz (81.6 kg)  12/17/18 184 lb 4.9 oz (83.6 kg)     Other studies personally reviewed: Additional studies/ records that were reviewed today include: TTE 12/18/18  Review of the above records today demonstrates:    1. The left ventricle has normal systolic function of 60-65%. The cavity size is normal. There is no increased left ventricular wall thickness. Left ventricular diastology could not be evaluated secondary to atrial fibrillation.  2. The right ventricle has normal systolic function. The cavity in normal in size. There is no increase in right ventricular wall thickness.  3. Mildly dilated left atrial size.  4. The mitral valve is normal in structure  There is mild thickening.  5. The aortic valve is tricuspid. There is mild thickening of the aortic valve.  6. Normal LV systolic function; mild TR.  ECG personally reviewed 01/14/19 SR, PACs  Monitor 02/10/19 personally reviewed Predominantly atrial fibrillation with two post-termination pauses lasting up to 4.6 seconds, as well as two episodes of likely NSVT lasting up to 18 beats.   ASSESSMENT & PLAN:    1.  Persistent atrial fibrillation: Monitor shows 99% burden.  She converted to sinus rhythm twice with pauses of up to 4.6 seconds.  It is certainly possible that her dyspnea on exertion is related to her atrial fibrillation.  Currently on Eliquis.  She is symptomatic from her atrial fibrillation.  We Mattelyn Imhoff just use a rhythm control strategy.  We Janiene Aarons plan to start her on 50 mg of flecainide twice a day.  If  she does not convert to normal rhythm, she Roe Wilner likely need a cardioversion.  We Adelbert Gaspard plan for that potentially next week.  She is also having some hair loss with her metoprolol.  We Bernardine Langworthy stop that and start her on 120 mg of diltiazem.  If flecainide does not control her atrial fibrillation, she would be a candidate for ablation.  This patients CHA2DS2-VASc Score and unadjusted Ischemic Stroke Rate (% per year) is equal to 7.2 % stroke rate/year from a score of 5  Above score calculated as 1 point each if present [CHF, HTN, DM, Vascular=MI/PAD/Aortic Plaque, Age if 65-74, or Female] Above score calculated as 2 points each if present [Age > 75, or Stroke/TIA/TE]  2.  Dyspnea on exertion: Likely due to atrial fibrillation.  She has had a normal echo in the past.  We Silverio Hagan plan for a rhythm control strategy.  3.  Hypertension: Currently well controlled  4.  Hyperlipidemia: Continue atorvastatin   COVID 19 screen The patient denies symptoms of COVID 19 at this time.  The importance of social distancing was discussed today.  Follow-up: 3 months  Current medicines are reviewed at length  with the patient today.   The patient does not have concerns regarding her medicines.  The following changes were made today:  none  Labs/ tests ordered today include:  No orders of the defined types were placed in this encounter.  Case discussed with primary cardiology  Patient Risk:  after full review of this patients clinical status, I feel that they are at moderate risk at this time.  Today, I have spent 15 minutes with the patient with telehealth technology discussing atrial fibrillation.    Signed, Geovanna Simko Martin Adriane Guglielmo, MD  03/11/2019 11:31 AM     [facility] [address] [place] [address] [phone] (office) [phone] (fax)  

## 2019-03-17 ENCOUNTER — Telehealth: Payer: Self-pay | Admitting: Cardiology

## 2019-03-17 NOTE — Telephone Encounter (Signed)
New message  Pt c/o medication issue:  1. Name of Medication: flecainide (TAMBOCOR) 50 MG tablet  2. How are you currently taking this medication (dosage and times per day)? Twice daily  3. Are you having a reaction (difficulty breathing--STAT)? No   4. What is your medication issue?Patient states that she is dizzy, brain fog, flushed and lips are numb. Patient wants to know if these are side effects of the medication?

## 2019-03-17 NOTE — Telephone Encounter (Addendum)
Pt reports feeling anxious, numb lips, fatigue, weak and foggy/confused.   Pt will hold Flecainide until advisement from Dr. Elberta Fortis. Pt reports still being in AFib. Pt aware we will wait for advisement from [facility] about a possibile different medication and/or DCCV. (per note from last week, if still in Afib this week schedule DCCV. However, I explained to pt that Camnitz may want another antiarrythmic on board before DCCV - she understood. I relieved her concern explaining that we can still schedule DCCV this week if needed)

## 2019-03-17 NOTE — Telephone Encounter (Signed)
Lmtcb.  Advised in message to hold medication until she talks to me

## 2019-03-18 MED ORDER — CARVEDILOL 6.25 MG PO TABS: 6.2500 mg | ORAL_TABLET | Freq: Two times a day (BID) | ORAL | 6 refills | Status: DC

## 2019-03-18 NOTE — Telephone Encounter (Signed)
Patient would like to speak to nurse, she is thinking now it's another medication she is taking as she is till having the same symptoms.

## 2019-03-18 NOTE — Telephone Encounter (Signed)
Pt reports she held her Flecainide last night and this morning.  She did take her Diltiazem this morning and symptoms reappeared. Advised to hold Diltiazem until further instructions. Aware I will call her back this afternoon after discussion with Camnitz.

## 2019-03-18 NOTE — Telephone Encounter (Signed)
Advised pt to restart Flecainide. Advised to stop Diltiazem and start Carvedilol 6.25 mg BID.  Pt advised to start this tomorrow as she has already taken Diltiazem this morning. Pt understands I will call her back to arrange DCCV for next week. She is agreeable to plan

## 2019-03-19 NOTE — Telephone Encounter (Signed)
Informed pt that I would contact her tomorrow about DCCV.  Endo is going into mtg now to discuss procedures changes. Pt agreeable to speaking tomorrow.

## 2019-03-20 NOTE — Telephone Encounter (Signed)
Pt feeling better today.  States her she thinks she may be back in NSR. States her BP have been a little low 95/63, HR 54 at 11:20 this am.  At 1:50 it was 120/76, HR 59.  States she is dizzy when BP low.   Advised to hold Hydrochlorothiazide if BP low, per Dr. Elberta Fortis. Pt understands I will follow up on Monday to see how her BP is doing and determine if DCCV needs to be scheduled. Pt agreeable to plan

## 2019-03-24 NOTE — Telephone Encounter (Signed)
Pt reports she is back in AFib.  Pt aware I will arrange for DCCV net week and call her this week to go over instructions. (she understands COVID testing prior) She reports feeling jittery/dizzy still.  Worsening SOB r/t AFib. She also reports BPs have increased since speaking last week and no other low ones. Recent BPs: 142/75, 165/99, 157/94, 156/98, 146/93.  Pt would like to continue to monitor these and not make any changes yet.  States there have already been so many changes and she would like to give this more time before considering another change. Pt and I agreed to speak in next couple days about COVID testing and DCCV

## 2019-03-25 ENCOUNTER — Other Ambulatory Visit: Payer: Self-pay

## 2019-03-25 ENCOUNTER — Telehealth: Payer: Self-pay | Admitting: *Deleted

## 2019-03-25 NOTE — Telephone Encounter (Signed)
Reviewed procedure instructions with pt. DCCV 5/19 Arrive at 9:30 am to [facility] NPO after MN Meds with sip of water morning of. Driver to drop her off/pick her up. F/u scheduled for July. Patient verbalized understanding and agreeable to plan.

## 2019-03-25 NOTE — Telephone Encounter (Signed)
Pre Procedure COVID19 Testing scheduled by scheduler fro May15th @ 11am.

## 2019-03-25 NOTE — Patient Outreach (Signed)
Telephone outreach to patient to obtain mRs was successfully completed. mRs= 1. 

## 2019-03-28 ENCOUNTER — Inpatient Hospital Stay (HOSPITAL_COMMUNITY): Admission: RE | Admit: 2019-03-28 | Payer: Medicare Other | Source: Ambulatory Visit

## 2019-03-28 ENCOUNTER — Other Ambulatory Visit (HOSPITAL_COMMUNITY)
Admission: RE | Admit: 2019-03-28 | Discharge: 2019-03-28 | Disposition: A | Discharge status: Home / Self Care | Payer: Medicare Other | Source: Ambulatory Visit | Attending: Cardiology | Admitting: Cardiology

## 2019-03-28 DIAGNOSIS — Z1159 Encounter for screening for other viral diseases: Secondary | ICD-10-CM | POA: Insufficient documentation

## 2019-03-29 LAB — NOVEL CORONAVIRUS, NAA (HOSP ORDER, SEND-OUT TO REF LAB; TAT 18-24 HRS): SARS-CoV-2, NAA: NOT DETECTED

## 2019-04-01 ENCOUNTER — Ambulatory Visit (HOSPITAL_COMMUNITY)
Admission: RE | Admit: 2019-04-01 | Discharge: 2019-04-01 | Disposition: A | Discharge status: Home / Self Care | Payer: Medicare Other | Attending: Cardiology | Admitting: Cardiology

## 2019-04-01 ENCOUNTER — Ambulatory Visit (HOSPITAL_COMMUNITY): Payer: Medicare Other | Admitting: Certified Registered"

## 2019-04-01 ENCOUNTER — Other Ambulatory Visit: Payer: Self-pay

## 2019-04-01 ENCOUNTER — Encounter (HOSPITAL_COMMUNITY): Payer: Self-pay | Admitting: *Deleted

## 2019-04-01 ENCOUNTER — Encounter (HOSPITAL_COMMUNITY)
Admission: RE | Disposition: A | Discharge status: Home / Self Care | Payer: Self-pay | Source: Home / Self Care | Attending: Cardiology

## 2019-04-01 DIAGNOSIS — E119 Type 2 diabetes mellitus without complications: Secondary | ICD-10-CM | POA: Insufficient documentation

## 2019-04-01 DIAGNOSIS — E785 Hyperlipidemia, unspecified: Secondary | ICD-10-CM | POA: Insufficient documentation

## 2019-04-01 DIAGNOSIS — I4891 Unspecified atrial fibrillation: Secondary | ICD-10-CM

## 2019-04-01 DIAGNOSIS — Z7901 Long term (current) use of anticoagulants: Secondary | ICD-10-CM | POA: Insufficient documentation

## 2019-04-01 DIAGNOSIS — Z8673 Personal history of transient ischemic attack (TIA), and cerebral infarction without residual deficits: Secondary | ICD-10-CM | POA: Diagnosis not present

## 2019-04-01 DIAGNOSIS — I4819 Other persistent atrial fibrillation: Secondary | ICD-10-CM | POA: Insufficient documentation

## 2019-04-01 DIAGNOSIS — Z79899 Other long term (current) drug therapy: Secondary | ICD-10-CM | POA: Diagnosis not present

## 2019-04-01 DIAGNOSIS — F329 Major depressive disorder, single episode, unspecified: Secondary | ICD-10-CM | POA: Diagnosis not present

## 2019-04-01 DIAGNOSIS — Z882 Allergy status to sulfonamides status: Secondary | ICD-10-CM | POA: Insufficient documentation

## 2019-04-01 DIAGNOSIS — R0609 Other forms of dyspnea: Secondary | ICD-10-CM | POA: Diagnosis not present

## 2019-04-01 DIAGNOSIS — K219 Gastro-esophageal reflux disease without esophagitis: Secondary | ICD-10-CM | POA: Diagnosis not present

## 2019-04-01 DIAGNOSIS — I10 Essential (primary) hypertension: Secondary | ICD-10-CM | POA: Diagnosis not present

## 2019-04-01 HISTORY — PX: CARDIOVERSION: SHX1299

## 2019-04-01 LAB — POCT I-STAT 4, (NA,K, GLUC, HGB,HCT)
Glucose, Bld: 96 mg/dL (ref 70–99)
HCT: 43 % (ref 36.0–46.0)
Hemoglobin: 14.6 g/dL (ref 12.0–15.0)
Potassium: 3.8 mmol/L (ref 3.5–5.1)
Sodium: 142 mmol/L (ref 135–145)

## 2019-04-01 SURGERY — CARDIOVERSION
Anesthesia: General

## 2019-04-01 MED ORDER — SODIUM CHLORIDE 0.9 % IV SOLN
INTRAVENOUS | Status: DC | PRN
  Administered 2019-04-01: 10:00:00 via INTRAVENOUS

## 2019-04-01 MED ORDER — PROPOFOL 10 MG/ML IV BOLUS
INTRAVENOUS | Status: DC | PRN
  Administered 2019-04-01: 50 mg via INTRAVENOUS

## 2019-04-01 MED ORDER — LIDOCAINE HCL (CARDIAC) PF 100 MG/5ML IV SOSY
PREFILLED_SYRINGE | INTRAVENOUS | Status: DC | PRN
  Administered 2019-04-01: 20 mg via INTRAVENOUS

## 2019-04-01 MED ORDER — SODIUM CHLORIDE 0.9 % IV SOLN
INTRAVENOUS | Status: AC | PRN
  Administered 2019-04-01: 500 mL via INTRAVENOUS

## 2019-04-01 NOTE — CV Procedure (Signed)
    Cardioversion Note  Emma Rivera [id number] 1940-11-18  Procedure: DC Cardioversion Indications: atrial fibrillation  Procedure Details Consent: Obtained Time Out: Verified patient identification, verified procedure, site/side was marked, verified correct patient position, special equipment/implants available, Radiology Safety Procedures followed,  medications/allergies/relevent history reviewed, required imaging and test results available.  Performed  The patient has been on adequate anticoagulation.  The patient received IV propofol administered by anesthesia staff for sedation.  Synchronous cardioversion was performed at 120 joules.  The cardioversion was successful.   Complications: No apparent complications Patient did tolerate procedure well.   Tobias Alexander, MD, [facility] 04/01/2019, 10:16 AM

## 2019-04-01 NOTE — Interval H&P Note (Signed)
History and Physical Interval Note:  04/01/2019 9:28 AM  Emma Rivera  has presented today for surgery, with the diagnosis of AFIB.  The various methods of treatment have been discussed with the patient and family. After consideration of risks, benefits and other options for treatment, the patient has consented to  Procedure(s): CARDIOVERSION (N/A) as a surgical intervention.  The patient's history has been reviewed, patient examined, no change in status, stable for surgery.  I have reviewed the patient's chart and labs.  Questions were answered to the patient's satisfaction.     Tobias Alexander

## 2019-04-01 NOTE — Anesthesia Postprocedure Evaluation (Signed)
Anesthesia Post Note  Patient: [name]  Procedure(s) Performed: CARDIOVERSION (N/A )     Patient location during evaluation: PACU Anesthesia Type: General Level of consciousness: awake and alert Pain management: pain level controlled Vital Signs Assessment: post-procedure vital signs reviewed and stable Respiratory status: spontaneous breathing, nonlabored ventilation, respiratory function stable and patient connected to nasal cannula oxygen Cardiovascular status: blood pressure returned to baseline and stable Postop Assessment: no apparent nausea or vomiting Anesthetic complications: no    Last Vitals:  Vitals:   04/01/19 1015 04/01/19 1025  BP: (!) 115/59 133/64  Pulse: (!) 55 (!) 57  Resp: 16 15  Temp: 36.5 C   SpO2: 97% 98%    Last Pain:  Vitals:   04/01/19 1015  TempSrc: Oral  PainSc: 0-No pain                 Kennieth Rad

## 2019-04-01 NOTE — Anesthesia Preprocedure Evaluation (Signed)
Anesthesia Evaluation  Patient identified by MRN, date of birth, ID band Patient awake    Reviewed: Allergy & Precautions, NPO status , Patient's Chart, lab work & pertinent test results  History of Anesthesia Complications Negative for: history of anesthetic complications  Airway Mallampati: II  TM Distance: >3 FB Neck ROM: Full    Dental  (+) Caps   Pulmonary neg pulmonary ROS,    breath sounds clear to auscultation       Cardiovascular Exercise Tolerance: Good hypertension, Pt. on medications (-) angina(-) CAD, (-) Past MI, (-) Cardiac Stents and (-) CABG + dysrhythmias Atrial Fibrillation + Valvular Problems/Murmurs  Rhythm:Irregular Rate:Normal  Heart murmur   Neuro/Psych PSYCHIATRIC DISORDERS    GI/Hepatic Neg liver ROS, GERD  Medicated and Controlled,  Endo/Other  diabetes (Borderline)  Renal/GU negative Renal ROS  negative genitourinary   Musculoskeletal negative musculoskeletal ROS (+)   Abdominal Normal abdominal exam  (+)   Peds negative pediatric ROS (+)  Hematology negative hematology ROS (+)   Anesthesia Other Findings   Reproductive/Obstetrics negative OB ROS                             Anesthesia Physical  Anesthesia Plan  ASA: II  Anesthesia Plan: General   Post-op Pain Management:    Induction: Intravenous  PONV Risk Score and Plan: 2 and Treatment may vary due to age or medical condition  Airway Management Planned: Mask and Natural Airway  Additional Equipment:   Intra-op Plan:   Post-operative Plan:   Informed Consent: I have reviewed the patients History and Physical, chart, labs and discussed the procedure including the risks, benefits and alternatives for the proposed anesthesia with the patient or authorized representative who has indicated his/her understanding and acceptance.       Plan Discussed with: CRNA and Surgeon  Anesthesia Plan  Comments:         Anesthesia Quick Evaluation

## 2019-04-01 NOTE — Transfer of Care (Signed)
Immediate Anesthesia Transfer of Care Note  Patient: Emma Rivera  Procedure(s) Performed: CARDIOVERSION (N/A )  Patient Location: Endoscopy Unit  Anesthesia Type:General  Level of Consciousness: awake and drowsy  Airway & Oxygen Therapy: Patient Spontanous Breathing  Post-op Assessment: Report given to RN, Post -op Vital signs reviewed and stable and Patient moving all extremities X 4  Post vital signs: Reviewed and stable  Last Vitals:  Vitals Value Taken Time  BP    Temp    Pulse    Resp    SpO2      Last Pain:  Vitals:   04/01/19 0938  TempSrc: Oral  PainSc: 0-No pain         Complications: No apparent anesthesia complications

## 2019-04-02 ENCOUNTER — Encounter (HOSPITAL_COMMUNITY): Payer: Self-pay | Admitting: Cardiology

## 2019-04-03 LAB — POCT I-STAT 4, (NA,K, GLUC, HGB,HCT)
Glucose, Bld: 99 mg/dL (ref 70–99)
HCT: 42 % (ref 36.0–46.0)
Hemoglobin: 14.3 g/dL (ref 12.0–15.0)
Potassium: >8.5 mmol/L (ref 3.5–5.1)
Sodium: 135 mmol/L (ref 135–145)

## 2019-04-11 MED ORDER — FUROSEMIDE 40 MG PO TABS: 40.0000 mg | ORAL_TABLET | Freq: Every day | ORAL | 0 refills | Status: DC

## 2019-04-11 NOTE — Telephone Encounter (Signed)
Advised pt to take Lasix 40 mg for 3 days.  Rx sent. Advised to increase potassium intake over the next 3 days. Advised that I would follow up on Monday. Advised to call over weekend if symptoms worsen. Patient verbalized understanding and agreeable to plan.

## 2019-04-14 NOTE — Telephone Encounter (Signed)
Pt reports she is still experiencing SOB and she is now back in AFib. Pt aware AFib clinic will contact her to arrange OV this week to evaluate/discuss further.

## 2019-04-17 ENCOUNTER — Ambulatory Visit (HOSPITAL_COMMUNITY)
Admission: RE | Admit: 2019-04-17 | Discharge: 2019-04-17 | Disposition: A | Discharge status: Home / Self Care | Payer: Medicare Other | Source: Ambulatory Visit | Attending: Physician Assistant | Admitting: Physician Assistant

## 2019-04-17 ENCOUNTER — Other Ambulatory Visit: Payer: Self-pay

## 2019-04-17 ENCOUNTER — Encounter (HOSPITAL_COMMUNITY): Payer: Self-pay | Admitting: Physician Assistant

## 2019-04-17 VITALS — BP 124/76 | HR 95 | Ht 62.0 in | Wt 180.4 lb

## 2019-04-17 DIAGNOSIS — F329 Major depressive disorder, single episode, unspecified: Secondary | ICD-10-CM | POA: Diagnosis not present

## 2019-04-17 DIAGNOSIS — E669 Obesity, unspecified: Secondary | ICD-10-CM | POA: Insufficient documentation

## 2019-04-17 DIAGNOSIS — I4819 Other persistent atrial fibrillation: Secondary | ICD-10-CM | POA: Insufficient documentation

## 2019-04-17 DIAGNOSIS — K219 Gastro-esophageal reflux disease without esophagitis: Secondary | ICD-10-CM | POA: Insufficient documentation

## 2019-04-17 DIAGNOSIS — R0600 Dyspnea, unspecified: Secondary | ICD-10-CM | POA: Diagnosis not present

## 2019-04-17 DIAGNOSIS — I10 Essential (primary) hypertension: Secondary | ICD-10-CM | POA: Insufficient documentation

## 2019-04-17 DIAGNOSIS — Z7901 Long term (current) use of anticoagulants: Secondary | ICD-10-CM | POA: Diagnosis not present

## 2019-04-17 DIAGNOSIS — R7303 Prediabetes: Secondary | ICD-10-CM | POA: Diagnosis not present

## 2019-04-17 DIAGNOSIS — R011 Cardiac murmur, unspecified: Secondary | ICD-10-CM | POA: Insufficient documentation

## 2019-04-17 DIAGNOSIS — Z79899 Other long term (current) drug therapy: Secondary | ICD-10-CM | POA: Diagnosis not present

## 2019-04-17 DIAGNOSIS — Z6833 Body mass index (BMI) 33.0-33.9, adult: Secondary | ICD-10-CM | POA: Insufficient documentation

## 2019-04-17 NOTE — Progress Notes (Signed)
Primary Care Physician: Marisue IvanLinthavong, Kanhka, MD Primary Cardiologist: Dr End Primary Electrophysiologist: Dr Elberta Fortisamnitz Referring Physician: Dr Darnelle Bosamnitz   Emma Rivera is a 78 y.o. female with a history of persistent atrial fibrillation, hypertension, GERD, CVA, and esophageal spasm who presents for follow up in the [facility]. Patient is currently on flecainide and Eliquis. She underwent DCCV on 04/01/19 but began to have symptoms of SOB on 04/11/19 which has typically been associated with her afib in the past. She is in rate controlled afib today. She brings in her BP and HR log which shows rates have been well controlled even in afib. She continues to have fatigue and SOB. She denies snoring or significant alcohol use.  Today, she denies symptoms of palpitations, chest pain, orthopnea, PND, lower extremity edema, presyncope, syncope, snoring, daytime somnolence, bleeding, or neurologic sequela. The patient is tolerating medications without difficulties and is otherwise without complaint today.    Atrial Fibrillation Risk Factors:  she does not have symptoms or diagnosis of sleep apnea. she does not have a history of rheumatic fever. she does not have a history of alcohol use. The patient does not have a history of early familial atrial fibrillation or other arrhythmias.  she has a BMI of Body mass index is 33 kg/m.Marland Kitchen. Filed Weights   04/17/19 0856  Weight: 81.8 kg    Family History  Problem Relation Age of Onset  . Breast cancer Maternal Aunt      Atrial Fibrillation Management history:  Previous antiarrhythmic drugs: flecainide Previous cardioversions: 04/03/19 Previous ablations: none CHADS2VASC score: 556 (age, female, CVA, HTN) Anticoagulation history: Eliquis   Past Medical History:  Diagnosis Date  . Atrial fibrillation (HCC)   . Depression   . Esophageal spasm   . Esophageal spasm   . GERD (gastroesophageal reflux disease)   . Heart murmur    . HOH (hard of hearing)   . Hypertension   . Pre-diabetes   . Stroke [facility](HCC)    Past Surgical History:  Procedure Laterality Date  . CARDIAC CATHETERIZATION    . CARDIOVERSION N/A 04/01/2019   Procedure: CARDIOVERSION;  Surgeon: Lars MassonNelson, Katarina H, MD;  [facility];  Service: Cardiovascular;  Laterality: N/A;  . CATARACT EXTRACTION W/PHACO Left 09/09/2015   Procedure: CATARACT EXTRACTION PHACO AND INTRAOCULAR LENS PLACEMENT (IOC);  Surgeon: Lia HoppingNisha Mukherjee, MD;  Location: [facility] ORS;  Service: Ophthalmology;  Laterality: Left;  US   1:10.5 AP     8.6 CDE  6.05 casette lot #  [id number]  . CATARACT EXTRACTION W/PHACO Right 09/30/2015   Procedure: CATARACT EXTRACTION PHACO AND INTRAOCULAR LENS PLACEMENT (IOC);  Surgeon: Lia HoppingNisha Mukherjee, MD;  Location: [facility] ORS;  Service: Ophthalmology;  Laterality: Right;  US 01:00.3 AP%: 11.6 CDE: 7.00 FLUID LOT# [id number]  . COLONOSCOPY    . COLONOSCOPY WITH PROPOFOL N/A 04/04/2017   Procedure: COLONOSCOPY WITH PROPOFOL;  Surgeon: Scot JunElliott, Robert T, MD;  [facility];  Service: Endoscopy;  Laterality: N/A;  . TONSILLECTOMY      Current Outpatient Medications  Medication Sig Dispense Refill  . acetaminophen (TYLENOL) 500 MG tablet Take 1,000 mg by mouth every 6 (six) hours as needed for moderate pain or headache.    Marland Kitchen. apixaban (ELIQUIS) 5 MG TABS tablet Take 1 tablet (5 mg total) by mouth 2 (two) times daily. 60 tablet 0  . atorvastatin (LIPITOR) 20 MG tablet TAKE 1 TABLET (20 MG TOTAL) BY MOUTH DAILY AT 6 PM. 90 tablet 2  .  carvedilol (COREG) 6.25 MG tablet Take 1 tablet (6.25 mg total) by mouth 2 (two) times daily. 60 tablet 6  . citalopram (CELEXA) 20 MG tablet Take 20 mg by mouth daily.    . flecainide (TAMBOCOR) 50 MG tablet Take 1 tablet (50 mg total) by mouth 2 (two) times daily. 60 tablet 3  . furosemide (LASIX) 40 MG tablet Take 1 tablet (40 mg total) by mouth daily. 5 tablet 0  . hydrochlorothiazide (HYDRODIURIL) 25 MG  tablet Take 25 mg by mouth daily.    Marland Kitchen losartan (COZAAR) 100 MG tablet Take 100 mg by mouth daily.    . Multiple Vitamin (MULTIVITAMIN) tablet Take 1 tablet by mouth daily.    Marland Kitchen omeprazole (PRILOSEC) 20 MG capsule Take 20 mg by mouth daily.    Marland Kitchen tolterodine (DETROL LA) 2 MG 24 hr capsule Take 2 mg by mouth 2 (two) times daily.      No current facility-administered medications for this encounter.     Allergies  Allergen Reactions  . Sulfa Antibiotics Nausea Only    Social History   Socioeconomic History  . Marital status: Married    Spouse name: Not on file  . Number of children: Not on file  . Years of education: Not on file  . Highest education level: Not on file  Occupational History  . Not on file  Social Needs  . Financial resource strain: Not on file  . Food insecurity:    Worry: Not on file    Inability: Not on file  . Transportation needs:    Medical: Not on file    Non-medical: Not on file  Tobacco Use  . Smoking status: Never Smoker  . Smokeless tobacco: Never Used  Substance and Sexual Activity  . Alcohol use: Yes    Comment: occasional  . Drug use: No  . Sexual activity: Not on file  Lifestyle  . Physical activity:    Days per week: Not on file    Minutes per session: Not on file  . Stress: Not on file  Relationships  . Social connections:    Talks on phone: Not on file    Gets together: Not on file    Attends religious service: Not on file    Active member of club or organization: Not on file    Attends meetings of clubs or organizations: Not on file    Relationship status: Not on file  . Intimate partner violence:    Fear of current or ex partner: Not on file    Emotionally abused: Not on file    Physically abused: Not on file    Forced sexual activity: Not on file  Other Topics Concern  . Not on file  Social History Narrative  . Not on file     ROS- All systems are reviewed and negative except as per the HPI above.  Physical Exam:  Vitals:   04/17/19 0856  BP: 124/76  Pulse: 95  Weight: 81.8 kg  Height: 5\' 2"  (1.575 m)    GEN- The patient is well appearing obese female, alert and oriented x 3 today.   Head- normocephalic, atraumatic Eyes-  Sclera clear, conjunctiva pink Ears- hearing intact Oropharynx- clear Neck- supple  Lungs- Clear to ausculation bilaterally, normal work of breathing Heart- irregular rate and rhythm, no murmurs, rubs or gallops  GI- soft, NT, ND, + BS Extremities- no clubbing, cyanosis, or edema MS- no significant deformity or atrophy Skin- no rash or lesion Psych-  euthymic mood, full affect Neuro- strength and sensation are intact  Wt Readings from Last 3 Encounters:  04/17/19 81.8 kg  04/01/19 79.4 kg  03/07/19 79.4 kg    EKG today demonstrates afib HR 95, QRS 76, QTc 394  Echo 12/18/18 demonstrated   1. The left ventricle has normal systolic function of 60-65%. The cavity size is normal. There is no increased left ventricular wall thickness. Left ventricular diastology could not be evaluated secondary to atrial fibrillation.  2. The right ventricle has normal systolic function. The cavity in normal in size. There is no increase in right ventricular wall thickness.  3. Mildly dilated left atrial size.  4. The mitral valve is normal in structure There is mild thickening.  5. The aortic valve is tricuspid. There is mild thickening of the aortic valve.  6. Normal LV systolic function; mild TR.  Epic records are reviewed at length today  Assessment and Plan:  1. Persistent atrial fibrillation The patient has persistent atrial fibrillation.  S/p DCCV 04/01/19 with ERAF. Pt failing flecainide. Would be hesitant to increase flecainide further given significant prolongation of PR interval above baseline. We discussed therapeutic options including dofetilide, sotalol, amiodarone, and ablation. After discussing the risks and benefits, pt would like to pursue ablation.  Continue  flecainide 50 mg BID Continue Coreg 6.25 mg BID Continue Eliquis 5 mg BID  This patients CHA2DS2-VASc Score and unadjusted Ischemic Stroke Rate (% per year) is equal to 9.7 % stroke rate/year from a score of 6  Above score calculated as 1 point each if present [CHF, HTN, DM, Vascular=MI/PAD/Aortic Plaque, Age if 65-74, or Female] Above score calculated as 2 points each if present [Age > 75, or Stroke/TIA/TE]   2. Obesity Body mass index is 33 kg/m. Lifestyle modification was discussed at length including regular exercise and weight reduction.  3. HTN Stable, no changes today.  4. Dyspnea Likely related to afib. Plans for stress testing per Dr End noted. No signs of fluid overload.   Follow up with Dr Elberta Fortis to discuss ablation.   Jorja Loa PA-C Afib Clinic [facility] [address] [place], [place] [address] [phone] 04/17/2019 10:06 AM

## 2019-04-24 ENCOUNTER — Telehealth: Payer: Self-pay | Admitting: *Deleted

## 2019-04-24 ENCOUNTER — Telehealth: Payer: Self-pay | Admitting: Cardiology

## 2019-04-24 NOTE — Telephone Encounter (Signed)
Patient would like a call back about the message she sent in earlier today via the patient portal.  She doesn't want a virtual visit, she wants to come into the office.

## 2019-04-24 NOTE — Telephone Encounter (Signed)
   Primary Cardiologist: No primary care provider on file.   Pt contacted.  History and symptoms reviewed.  Pt will f/u with [facility] provider as scheduled.  Pt. advised that we are restricting visitors at this time and request that only patients present for check-in prior to their appointment.  All other visitors should remain in their car.  If necessary, only one visitor may come with the patient, into the building. For everyone's safety, all patients and visitor entering our practice area should expect to be screened again prior to entering our waiting area.  Tamari Redwine  04/24/2019 12:55 PM     COVID-19 Pre-Screening Questions:  . In the past 7 to 10 days have you had a cough,  shortness of breath, headache, congestion, fever (100 or greater) body aches, chills, sore throat, or sudden loss of taste or sense of smell?  no . Have you been around anyone with known Covid 19?  no . Have you been around anyone who is awaiting Covid 19 test results in the past 7 to 10 days?  no Have you been around anyone who has been exposed to Covid 19, or has mentioned symptoms of Covid 19 within the past 7 to 10 days?  no If you have any concerns/questions about symptoms patients report during screening (either on the phone or at threshold). Contact the provider seeing the patient or DOD for further guidance.  If neither are available contact a member of the leadership team.

## 2019-04-25 MED ORDER — CARVEDILOL 3.125 MG PO TABS: 3.1250 mg | ORAL_TABLET | Freq: Two times a day (BID) | ORAL | 3 refills | Status: DC

## 2019-04-25 NOTE — Telephone Encounter (Signed)
Pt reports that she has not been in AFib for 5 days. When she gets up in the morning, pt feels ok.  After taking Flecainide & Carvedilol, w/i 30 minutes she feels bad.  By the afternoon she "is normal again". She is tired of the extreme fatigue, no energy to do anything. She also has some edema b/d she reports not being able to get wedding ring off b/c her fingers are swollen. Yesterday morning 9:40 am HR was 52, this morning at 10:00 am HR was 48. Pt reports HRs  Will discuss w/ Dr. Curt Bears about holding Carvedilol until we see her Tuesday d/t low HRs.  Informed that we cannot stop both medications at the same time (because then we won't know which is causing SE), but can stop one at a time to see if either medication is the culprit. Pt understands I will call her once reviewed/advised by Dr. Curt Bears. She is agreeable to plan.

## 2019-04-25 NOTE — Telephone Encounter (Signed)
Advised pt to decrease Carvedilol in half, to 3.125 mg BID. Will address further at [place] Tuesday. Pt will call if issues worsen over w/e. Patient verbalized understanding and agreeable to plan.

## 2019-04-29 ENCOUNTER — Encounter: Payer: Self-pay | Admitting: Cardiology

## 2019-04-29 ENCOUNTER — Other Ambulatory Visit: Payer: Self-pay

## 2019-04-29 ENCOUNTER — Ambulatory Visit: Payer: Medicare Other | Admitting: Cardiology

## 2019-04-29 VITALS — BP 130/86 | HR 65 | Ht 61.0 in | Wt 181.0 lb

## 2019-04-29 DIAGNOSIS — I480 Paroxysmal atrial fibrillation: Secondary | ICD-10-CM

## 2019-04-29 MED ORDER — FLECAINIDE ACETATE 100 MG PO TABS: 100.0000 mg | ORAL_TABLET | Freq: Two times a day (BID) | ORAL | 3 refills | Status: DC

## 2019-04-29 MED ORDER — CARVEDILOL 3.125 MG PO TABS: 3.1250 mg | ORAL_TABLET | Freq: Two times a day (BID) | ORAL | 1 refills | Status: DC

## 2019-04-29 NOTE — Progress Notes (Signed)
PCP:  Marisue IvanLinthavong, Kanhka, MD Primary Cardiologist: No primary care provider on file. Electrophysiologist: Will Jorja LoaMartin Camnitz, MD   Emma Rivera is a 78 y.o. female who presents today for electrophysiology followup for paroxysmal afib. Since last being seen in the Afib clinic. She is doing well.   She keeps meticulous track of her rhythm, but she only feels a difference in her pulse. She does not have palpitations, but feels an irregular pulse. She has SOB with her ADLs at baseline. She is SOB walking up about 10 stairs, and occasionally with bending over. She denies orthopnea or PND. She occasionally has a "light" feeling in her 1-2 times a week. She can't correlate this with being in sinus or afib. No focal neurological symptoms. No bleeding on Eliquis. She feels better on decreased coreg.     Past Medical History:  Diagnosis Date  . Atrial fibrillation (HCC)   . Depression   . Esophageal spasm   . Esophageal spasm   . GERD (gastroesophageal reflux disease)   . Heart murmur   . HOH (hard of hearing)   . Hypertension   . Pre-diabetes   . Stroke [facility](HCC)    Past Surgical History:  Procedure Laterality Date  . CARDIAC CATHETERIZATION    . CARDIOVERSION N/A 04/01/2019   Procedure: CARDIOVERSION;  Surgeon: Lars MassonNelson, Katarina H, MD;  [facility];  Service: Cardiovascular;  Laterality: N/A;  . CATARACT EXTRACTION W/PHACO Left 09/09/2015   Procedure: CATARACT EXTRACTION PHACO AND INTRAOCULAR LENS PLACEMENT (IOC);  Surgeon: Lia HoppingNisha Mukherjee, MD;  Location: [facility] ORS;  Service: Ophthalmology;  Laterality: Left;  US   1:10.5 AP     8.6 CDE  6.05 casette lot #  [id number]  . CATARACT EXTRACTION W/PHACO Right 09/30/2015   Procedure: CATARACT EXTRACTION PHACO AND INTRAOCULAR LENS PLACEMENT (IOC);  Surgeon: Lia HoppingNisha Mukherjee, MD;  Location: [facility] ORS;  Service: Ophthalmology;  Laterality: Right;  US 01:00.3 AP%: 11.6 CDE: 7.00 FLUID LOT# [id number]  . COLONOSCOPY    . COLONOSCOPY WITH PROPOFOL  N/A 04/04/2017   Procedure: COLONOSCOPY WITH PROPOFOL;  Surgeon: Scot JunElliott, Robert T, MD;  [facility];  Service: Endoscopy;  Laterality: N/A;  . TONSILLECTOMY      Current Outpatient Medications  Medication Sig Dispense Refill  . acetaminophen (TYLENOL) 500 MG tablet Take 1,000 mg by mouth every 6 (six) hours as needed for moderate pain or headache.    Marland Kitchen. apixaban (ELIQUIS) 5 MG TABS tablet Take 1 tablet (5 mg total) by mouth 2 (two) times daily. 60 tablet 0  . atorvastatin (LIPITOR) 20 MG tablet TAKE 1 TABLET (20 MG TOTAL) BY MOUTH DAILY AT 6 PM. 90 tablet 2  . carvedilol (COREG) 3.125 MG tablet Take 1 tablet (3.125 mg total) by mouth 2 (two) times daily. 180 tablet 3  . citalopram (CELEXA) 20 MG tablet Take 20 mg by mouth daily.    . flecainide (TAMBOCOR) 50 MG tablet Take 1 tablet (50 mg total) by mouth 2 (two) times daily. 60 tablet 3  . furosemide (LASIX) 40 MG tablet Take 1 tablet (40 mg total) by mouth daily. 5 tablet 0  . hydrochlorothiazide (HYDRODIURIL) 25 MG tablet Take 25 mg by mouth daily.    Marland Kitchen. losartan (COZAAR) 100 MG tablet Take 100 mg by mouth daily.    . Multiple Vitamin (MULTIVITAMIN) tablet Take 1 tablet by mouth daily.    Marland Kitchen. omeprazole (PRILOSEC) 20 MG capsule Take 20 mg by mouth daily.    Marland Kitchen. tolterodine (DETROL LA)  2 MG 24 hr capsule Take 2 mg by mouth 2 (two) times daily.      No current facility-administered medications for this visit.     Allergies  Allergen Reactions  . Sulfa Antibiotics Nausea Only    Social History   Socioeconomic History  . Marital status: Married    Spouse name: Not on file  . Number of children: Not on file  . Years of education: Not on file  . Highest education level: Not on file  Occupational History  . Not on file  Social Needs  . Financial resource strain: Not on file  . Food insecurity    Worry: Not on file    Inability: Not on file  . Transportation needs    Medical: Not on file    Non-medical: Not on file   Tobacco Use  . Smoking status: Never Smoker  . Smokeless tobacco: Never Used  Substance and Sexual Activity  . Alcohol use: Yes    Comment: occasional  . Drug use: No  . Sexual activity: Not on file  Lifestyle  . Physical activity    Days per week: Not on file    Minutes per session: Not on file  . Stress: Not on file  Relationships  . Social Musicianconnections    Talks on phone: Not on file    Gets together: Not on file    Attends religious service: Not on file    Active member of club or organization: Not on file    Attends meetings of clubs or organizations: Not on file    Relationship status: Not on file  . Intimate partner violence    Fear of current or ex partner: Not on file    Emotionally abused: Not on file    Physically abused: Not on file    Forced sexual activity: Not on file  Other Topics Concern  . Not on file  Social History Narrative  . Not on file     Review of Systems: General: No chills, fever, night sweats or weight changes  Cardiovascular:  No chest pain, dyspnea on exertion, edema, orthopnea, palpitations, paroxysmal nocturnal dyspnea Dermatological: No rash, lesions or masses Respiratory: No cough, dyspnea Urologic: No hematuria, dysuria Abdominal: No nausea, vomiting, diarrhea, bright red blood per rectum, melena, or hematemesis Neurologic: No visual changes, weakness, changes in mental status All other systems reviewed and are otherwise negative except as noted above.  Physical Exam: Vitals:   04/29/19 1417  BP: 130/86  Pulse: 65  Weight: 181 lb (82.1 kg)  Height: 5\' 1"  (1.549 m)    GEN- The patient is well appearing, alert and oriented x 3 today.   HEENT: normocephalic, atraumatic; sclera clear, conjunctiva pink; hearing intact; oropharynx clear; neck supple, no JVP Lymph- no cervical lymphadenopathy Lungs- Clear to ausculation bilaterally, normal work of breathing.  No wheezes, rales, rhonchi Heart- Regular rate and rhythm, no murmurs, rubs  or gallops, PMI not laterally displaced GI- soft, non-tender, non-distended, bowel sounds present, no hepatosplenomegaly Extremities- no clubbing, cyanosis, or edema; DP/PT/radial pulses 2+ bilaterally MS- no significant deformity or atrophy Skin- warm and dry, no rash or lesion Psych- euthymic mood, full affect Neuro- strength and sensation are intact  EKG is ordered today Personal review shows NSR at 65 bpm with PR interval of 216 ms.  Assessment and Plan:  1. Paroxysmal atrial fibrillation S/p DCCV 04/01/19 with ERAF.  - Will increase flecainide to 100 mg BID and follow closely. Discussed ablation at length  today and willing to consider. We will schedule her for this, and re-consider at a visit prior to if she is not feeling better in sinus on flecainide.  Stop coreg with fatigue. Will try on low dose verapamil.  Continue Eliquis 5 mg BID. She denies bleeding or missing any doses.  This patients CHA2DS2-VASc Score and unadjusted Ischemic Stroke Rate (% per year) is equal to 9.7 % stroke rate/year from a score of 6  Above score calculated as 1 point each if present [CHF, HTN, DM, Vascular=MI/PAD/Aortic Plaque, Age if 65-74, or Female] Above score calculated as 2 points each if present [Age > 75, or Stroke/TIA/TE]  2. HTN Stable on current regimen. Med changes as above.   3. Dyspnea Likely related to afib. Plans for stress testing per Dr End noted. Normal EF on Echo 12/18/2018. Euvolemic on exam today.   Shirley Friar, PA-C  04/29/19 2:56 PM   I have seen and examined this patient with Oda Kilts.  Agree with above, note added to reflect my findings.  On exam, RRR, no murmurs, lungs clear.  She has been going in and out of atrial fibrillation over the past few months.  She is tolerating her flecainide, but had a reduced dose of carvedilol due to weakness and fatigue.  We will plan to increase her flecainide to see if that we will keep her in normal rhythm.  We will  also plan for ablation.  Risks of ablation include bleeding, tamponade, heart block, stroke, damage surrounding organs.  She understands these risks and is agreed to the procedure.  Will M. Camnitz MD 04/29/2019 3:13 PM

## 2019-04-29 NOTE — Patient Instructions (Addendum)
Medication Instructions:  Your physician has recommended you make the following change in your medication:  1.  INCREASE Flecainide to 100 mg twice daily  * If you need a refill on your cardiac medications before your next appointment, please call your pharmacy.   Labwork: None ordered  Testing/Procedures: None ordered  Follow-Up: Your physician recommends that you schedule a follow-up appointment in: 2 weeks for nurse visit EKG.   Your physician recommends that you schedule a follow-up appointment in: 4-6 weeks with Dr. Curt Bears.  Thank you for choosing [facility]!!   Trinidad Curet, RN [phone]

## 2019-05-01 ENCOUNTER — Telehealth: Payer: Medicare Other | Admitting: Adult Health

## 2019-05-01 ENCOUNTER — Encounter: Payer: Self-pay | Admitting: Adult Health

## 2019-05-12 ENCOUNTER — Other Ambulatory Visit: Payer: Self-pay

## 2019-05-12 ENCOUNTER — Ambulatory Visit (INDEPENDENT_AMBULATORY_CARE_PROVIDER_SITE_OTHER): Payer: Medicare Other

## 2019-05-12 VITALS — BP 112/70 | HR 66 | Wt 180.0 lb

## 2019-05-12 DIAGNOSIS — I4891 Unspecified atrial fibrillation: Secondary | ICD-10-CM | POA: Diagnosis not present

## 2019-05-12 NOTE — Progress Notes (Signed)
1.) Reason for visit: EKG  2.) Name of MD requesting visit: Dr. Curt Bears  3.) H&P: Afib  4.) Assessment and plan per MD: The patient stated she has been more SOB and weak lately. Dr. Irish Lack (DOD) reviewed the EKG and had no changes.

## 2019-05-13 ENCOUNTER — Telehealth: Payer: Self-pay | Admitting: *Deleted

## 2019-05-13 NOTE — Telephone Encounter (Signed)
I have been in Afib for 11 days. Today I am not in Afib. I feel terrible-dizzy, no energy, short of breath. My blood pressure readings and pulse are following:  11:15am.    84/55.  43  11:45am.    81/52.  45  12:30pm.    99/61.  45  I am concerned.

## 2019-05-13 NOTE — Telephone Encounter (Signed)
Dr. Curt Bears reviewed. Pt advised to hold Carvedilol and Losartan.  Pt aware I will follow up daily for next several days.   Pt will call after hours if issues worsen. Holding ablation spot for 7/31.  Will determine by next week if want to proceed w/ scheduling procedure. Pt also reports slight swelling in hands/feet.  Cannot get ring off finger.  Pt aware I will f/u tomorrow to ensure number have improved and we can discuss couple days of Lasix for fluid retention. Patient verbalized understanding and agreeable to plan.

## 2019-05-14 NOTE — Telephone Encounter (Signed)
BPs improved.  Today it is 124/70 & 136/70. HRs are still in the 40s and pt still experiencing some dizziness. Swelling is stable and has not worsened. D/t current dizziness we will wait to address Lasix tomorrow. Pt aware that Carvedilol may still be lingering in her system. Will follow up tomorrow and if HRs still low I will address w/ Dr. Curt Bears. She will call back, even if after hours, if problems worsen before tomorrow. Patient verbalized understanding and agreeable to plan.

## 2019-05-15 ENCOUNTER — Ambulatory Visit: Payer: Medicare Other | Admitting: Adult Health

## 2019-05-15 ENCOUNTER — Other Ambulatory Visit: Payer: Self-pay

## 2019-05-15 ENCOUNTER — Encounter: Payer: Self-pay | Admitting: Adult Health

## 2019-05-15 VITALS — BP 148/72 | HR 41 | Temp 97.1°F | Ht 61.0 in | Wt 181.8 lb

## 2019-05-15 DIAGNOSIS — E785 Hyperlipidemia, unspecified: Secondary | ICD-10-CM

## 2019-05-15 DIAGNOSIS — I4819 Other persistent atrial fibrillation: Secondary | ICD-10-CM | POA: Diagnosis not present

## 2019-05-15 DIAGNOSIS — I6300 Cerebral infarction due to thrombosis of unspecified precerebral artery: Secondary | ICD-10-CM | POA: Diagnosis not present

## 2019-05-15 DIAGNOSIS — R42 Dizziness and giddiness: Secondary | ICD-10-CM

## 2019-05-15 DIAGNOSIS — I10 Essential (primary) hypertension: Secondary | ICD-10-CM | POA: Diagnosis not present

## 2019-05-15 NOTE — Telephone Encounter (Signed)
BP today 150/81, HR 40 & 116/71, HR 48. Pt had neurology OV today and HR was 41. I had pt medication list reviewed by [facility]. Reviewed w/ Camnitz Pt advised to re-start Losartan.  Advised to stop Flecainide & Carvedilol. Advised to call over w/e if AFib issues begin after stopping medications. Pt aware that he recommends scheduling ablation d/t issues.  Ablation for 7/31 was already on hold for the pt. Pt is agreeable to procedure and holding medications. Aware that I will follow up next week to see how she is doing after stopping the medications and discuss procedure instructions. Patient verbalized understanding and agreeable to plan.

## 2019-05-15 NOTE — Patient Instructions (Addendum)
Continue Eliquis (apixaban) daily  and atorvastatin for secondary stroke prevention  Continue to follow up with PCP regarding cholesterol and blood pressure management   Due to your sleeping tightness sensation, we will check an EEG to ensure there is no abnormality within your brain causing this to happen  Continue to follow with cardiology for atrial fibrillation and Eliquis management along with ongoing management of your blood pressure and heart rate  Continue stay active and maintain a healthy diet  Continue to monitor blood pressure at home  Maintain strict control of hypertension with blood pressure goal below 130/90, diabetes with hemoglobin A1c goal below 6.5% and cholesterol with LDL cholesterol (bad cholesterol) goal below 70 mg/dL. I also advised the patient to eat a healthy diet with plenty of whole grains, cereals, fruits and vegetables, exercise regularly and maintain ideal body weight.  Followup in the future with me in 3 months or call earlier if needed       Thank you for coming to see [place] at [facility]. I hope we have been able to provide you high quality care today.  You may receive a patient satisfaction survey over the next few weeks. We would appreciate your feedback and comments so that we may continue to improve ourselves and the health of our patients.

## 2019-05-15 NOTE — Progress Notes (Signed)
[facility] [address] [place] [phone]       OFFICE FOLLOW UP NOTE  Ms. Emma LeepAnn B Exantus Date of Birth:  09-04-1941 Medical Record Number:  [id number]   Reason for Referral:  hospital stroke follow up     CHIEF COMPLAINT:  Chief Complaint  Patient presents with  . Follow-up    4 mon f/u. Alone. Treatment room. Patient stated that she has a sweeping of the brain feeling. She stated that she feels like she's going to pass out when this happens. She stated that she hasn't passed out and that the "sweeping" feeling doesn't last that long.     HPI:  05/15/19 visit Ms. 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 fib.  She continues to be stable from a stroke standpoint without residual deficits or reoccurring of symptoms.  Continues on Eliquis without bleeding or bruising.  She continues to follow with cardiology for atrial fibrillation and underwent cardioversion on 04/01/2019 which was successful.  Continues on atorvastatin without myalgias.   She does endorse "sweeping" sensation  Zip sensation prior to stroke but now more drawn out sensation Now feels as though its like a hand going over face - feels as though she may pass out - denies vision loss or any other neurological symptoms. Can happen randomly. Lasts only a couple seconds. Lightheadedness. Denies palpitations, SOB or chest pain. Denies pain. Has occurred x2. Has never lost consciousness. Mother had same sensation who passed of a heart attack.  Dizziness - cardiology believes related to medications  Blood pressure this AM 155/78, HR 40 Today at visit 178/78 HR 55 Cardiology has been adjusting medications as she was experiencing hypotension with SBP 80s She also endorses dyspnea sensation She alsmost feels better in Afib   No further concerns at this time.     Initial telemedicine visit 02/04/2019: Emma Rivera is a 78 y.o. female who was initially scheduled for face-to-face office visit today at this time but [facility] to [id number],face-to-face office visit rescheduled for non-face-to-face telephone visit.  [facility] after left frontal infarct s/p L M2 occlusion secondary to new diagnosis of A. Fib.  Eliquis initiated for secondary stroke prevention and A. fib management with recommendation of following with cardiology outpatient.  Since she has been home, she states she has been doing well with complete resolution of aphasic symptoms and denies any recurrence or new stroke/TIA symptoms.  She has continued on Eliquis without side effects of bleeding or bruising.  Continues on atorvastatin without side effects myalgias.  She does monitor BP at home which has been stable.  She continues to follow with cardiology for PAF and Eliquis management and has completed 14-day cardiac monitor to assess for PAF burden.  She does report daytime fatigue, easily to nap and snores.  No further concerns at this time.  Denies new or worsening stroke/TIA symptoms.    ROS:   14 system review of systems performed and negative with exception of dizziness  PMH:  Past Medical History:  Diagnosis Date  . Atrial fibrillation (HCC)   . Depression   . Esophageal spasm   . Esophageal spasm   . GERD (gastroesophageal reflux disease)   . Heart murmur   . HOH (hard of hearing)   . Hypertension   . Pre-diabetes   . Stroke (  HCC)     PSH:  Past Surgical History:  Procedure Laterality Date  . CARDIAC CATHETERIZATION    . CARDIOVERSION N/A 04/01/2019   Procedure: CARDIOVERSION;  Surgeon: Lars MassonNelson, Katarina H, MD;  [facility];  Service: Cardiovascular;  Laterality: N/A;  . CATARACT EXTRACTION W/PHACO Left 09/09/2015   Procedure: CATARACT EXTRACTION PHACO AND INTRAOCULAR LENS PLACEMENT (IOC);  Surgeon: Lia HoppingNisha Mukherjee, MD;  Location:  [facility] ORS;  Service: Ophthalmology;  Laterality: Left;  US   1:10.5 AP     8.6 CDE  6.05 casette lot #  [id number]  . CATARACT EXTRACTION W/PHACO Right 09/30/2015   Procedure: CATARACT EXTRACTION PHACO AND INTRAOCULAR LENS PLACEMENT (IOC);  Surgeon: Lia HoppingNisha Mukherjee, MD;  Location: [facility] ORS;  Service: Ophthalmology;  Laterality: Right;  US 01:00.3 AP%: 11.6 CDE: 7.00 FLUID LOT# [id number]  . COLONOSCOPY    . COLONOSCOPY WITH PROPOFOL N/A 04/04/2017   Procedure: COLONOSCOPY WITH PROPOFOL;  Surgeon: Scot JunElliott, Robert T, MD;  [facility];  Service: Endoscopy;  Laterality: N/A;  . TONSILLECTOMY      Social History:  Social History   Socioeconomic History  . Marital status: Married    Spouse name: Not on file  . Number of children: Not on file  . Years of education: Not on file  . Highest education level: Not on file  Occupational History  . Not on file  Social Needs  . Financial resource strain: Not on file  . Food insecurity    Worry: Not on file    Inability: Not on file  . Transportation needs    Medical: Not on file    Non-medical: Not on file  Tobacco Use  . Smoking status: Never Smoker  . Smokeless tobacco: Never Used  Substance and Sexual Activity  . Alcohol use: Yes    Comment: occasional  . Drug use: No  . Sexual activity: Not on file  Lifestyle  . Physical activity    Days per week: Not on file    Minutes per session: Not on file  . Stress: Not on file  Relationships  . Social Musicianconnections    Talks on phone: Not on file    Gets together: Not on file    Attends religious service: Not on file    Active member of club or organization: Not on file    Attends meetings of clubs or organizations: Not on file    Relationship status: Not on file  . Intimate partner violence    Fear of current or ex partner: Not on file    Emotionally abused: Not on file    Physically abused: Not on file    Forced sexual activity: Not on file  Other Topics Concern  . Not on  file  Social History Narrative  . Not on file    Family History:  Family History  Problem Relation Age of Onset  . Breast cancer Maternal Aunt     Medications:   Current Outpatient Medications on File Prior to Visit  Medication Sig Dispense Refill  . acetaminophen (TYLENOL) 500 MG tablet Take 1,000 mg by mouth every 6 (six) hours as needed for moderate pain or headache.    Marland Kitchen. apixaban (ELIQUIS) 5 MG TABS tablet Take 1 tablet (5 mg total) by mouth 2 (two) times daily. 60 tablet 0  . atorvastatin (LIPITOR) 20 MG tablet TAKE 1 TABLET (20 MG TOTAL) BY MOUTH DAILY AT 6 PM. 90 tablet 2  . citalopram (CELEXA) 20 MG tablet Take 20  mg by mouth daily.    . flecainide (TAMBOCOR) 100 MG tablet Take 1 tablet (100 mg total) by mouth 2 (two) times daily. 60 tablet 3  . hydrochlorothiazide (HYDRODIURIL) 25 MG tablet Take 25 mg by mouth daily.    . Multiple Vitamin (MULTIVITAMIN) tablet Take 1 tablet by mouth daily.    Marland Kitchen. omeprazole (PRILOSEC) 20 MG capsule Take 20 mg by mouth daily.    Marland Kitchen. tolterodine (DETROL LA) 2 MG 24 hr capsule Take 2 mg by mouth 2 (two) times daily.     . carvedilol (COREG) 3.125 MG tablet Take 1 tablet (3.125 mg total) by mouth 2 (two) times daily. (Patient not taking: Reported on 05/15/2019) 180 tablet 1  . losartan (COZAAR) 100 MG tablet Take 100 mg by mouth daily.     No current facility-administered medications on file prior to visit.     Allergies:   Allergies  Allergen Reactions  . Sulfa Antibiotics Nausea Only     Physical Exam  Vitals:   05/15/19 0757  BP: (!) 178/78  Pulse: (!) 55  Temp: (!) 97.1 F (36.2 C)  TempSrc: Oral  Weight: 181 lb 12.8 oz (82.5 kg)  Height: 5\' 1"  (1.549 m)   Body mass index is 34.35 kg/m. No exam data present  [facility]/01/2019  Decreased Interest 0  Down, Depressed, Hopeless 0  PHQ - 2 Score 0     General: well developed, well nourished,  pleasant elderly Caucasian female, seated, in no evident distress  Head: head normocephalic and atraumatic.   Neck: supple with no carotid or supraclavicular bruits Cardiovascular: regular rate and rhythm, no murmurs Musculoskeletal: no deformity Skin:  no rash/petichiae Vascular:  Normal pulses all extremities   Neurologic Exam Mental Status: Awake and fully alert. Oriented to place and time. Recent and remote memory intact. Attention span, concentration and fund of knowledge appropriate. Mood and affect appropriate.  Cranial Nerves: Pupils equal, briskly reactive to light. Extraocular movements full without nystagmus. Visual fields full to confrontation. Hearing intact. Facial sensation intact. Face, tongue, palate moves normally and symmetrically.  Motor: Normal bulk and tone. Normal strength in all tested extremity muscles. Sensory.: intact to touch , pinprick , position and vibratory sensation.  Coordination: Rapid alternating movements normal in all extremities. Finger-to-nose and heel-to-shin performed accurately bilaterally. Gait and Station: Arises from chair without difficulty. Stance is normal. Gait demonstrates normal stride length and balance Reflexes: 1+ and symmetric. Toes downgoing.        ASSESSMENT: Ms. Emma Rivera participated in telephone visit today with discussion in regards to recent left frontal infarct secondary to newly diagnosed A. fib. Denies residual deficits.  Does endorse "sweeping" sensation which is further described in HPI.   PLAN:   1. Left frontal infarct: Continue Eliquis (apixaban) daily  and Lipitor for secondary stroke prevention. Maintain strict control of hypertension with blood pressure goal below 130/90, diabetes with hemoglobin A1c goal below 6.5% and cholesterol with LDL cholesterol (bad cholesterol) goal below 70 mg/dL.  I also advised the patient to eat a healthy diet with plenty of whole grains, cereals, fruits and vegetables, exercise regularly with at least 30 minutes of continuous activity daily and maintain  ideal body weight. 2. PAF: Continue Eliquis along with ongoing follow-up with cardiologist for monitoring and management 3. HTN: Advised to continue current treatment regimen.  Advised to continue to monitor at home along with continued follow-up with PCP for management 4. HLD: Advised to continue current treatment regimen along  with continued follow-up with PCP for future prescribing and monitoring of lipid panel 5. "Sweeping" sensation: Recommend EEG to ensure no further abnormality neurologically which could be causing sensation.    Follow up in 3 months or call earlier if needed   Greater than 50% of time during this 25 minute visit was spent on counseling, explanation of diagnosis of left frontal infarct, reviewing risk factor management of PAF, HTN and HLD, planning of further management along with potential future management, and discussion with patient and family answering all questions.    Venancio Poisson, AGNP-BC  [facility] [address] [place], [place] [address]  Phone [phone] Fax [phone] Note: This document was prepared with digital dictation and possible smart phrase technology. Any transcriptional errors that result from this process are unintentional.

## 2019-05-19 ENCOUNTER — Other Ambulatory Visit: Payer: Self-pay

## 2019-05-19 ENCOUNTER — Ambulatory Visit: Payer: Medicare Other | Admitting: Neurology

## 2019-05-19 DIAGNOSIS — R55 Syncope and collapse: Secondary | ICD-10-CM | POA: Diagnosis not present

## 2019-05-23 ENCOUNTER — Encounter: Payer: Self-pay | Admitting: Adult Health

## 2019-05-23 ENCOUNTER — Telehealth: Payer: Self-pay | Admitting: *Deleted

## 2019-05-23 DIAGNOSIS — I4891 Unspecified atrial fibrillation: Secondary | ICD-10-CM

## 2019-05-23 NOTE — Telephone Encounter (Signed)
AFib ablation scheduled for 7/31. Proceudre & CT instructions sent via mychart and reviewed w/ pt. COVID screening scheduled for 7/28. Pt aware office will call to arrange pre CT testing and post procedure follow up appts. Patient verbalized understanding and agreeable to plan.

## 2019-05-23 NOTE — Progress Notes (Signed)
I agree with the above plan 

## 2019-05-23 NOTE — Telephone Encounter (Signed)
Followed up with patient. She tells me she is doing much better off the medications.  HRs improved.  Feels better.  She will stay off medications and continue to monitor.

## 2019-05-28 ENCOUNTER — Telehealth: Payer: Self-pay | Admitting: *Deleted

## 2019-05-28 NOTE — Telephone Encounter (Signed)
Spoke with patient and informed her that she had a normal-appearing EEG. She stated she had already seen her result in my chart,  verbalized understanding, appreciation.

## 2019-05-30 ENCOUNTER — Ambulatory Visit: Payer: Medicare Other | Admitting: Cardiology

## 2019-06-03 NOTE — Telephone Encounter (Signed)
Returned call to Pt.  Advised Pt that this nurse could not advise what she should do.   Did advise that Pt has [place] afib and it could return.  Per review of records Pt does not feel well in afib.  Advised if she wanted to cancel that would be ok.  Discussed rescheduling could take time if she went back into afib and felt bad.  Pt thanked nurse for calling her.  Pt will keep ablation as scheduled at this time.

## 2019-06-06 ENCOUNTER — Telehealth (HOSPITAL_COMMUNITY): Payer: Self-pay | Admitting: Emergency Medicine

## 2019-06-06 NOTE — Telephone Encounter (Signed)
Reaching out to patient to offer assistance regarding upcoming cardiac imaging study; pt verbalizes understanding of appt date/time, parking situation and where to check in, pre-test NPO status and medications ordered, and verified current allergies; name and call back number provided for further questions should they arise Tamme Mozingo RN Navigator Cardiac Imaging [facility] [phone] office [phone] cell 

## 2019-06-09 ENCOUNTER — Ambulatory Visit (HOSPITAL_COMMUNITY): Payer: Medicare Other

## 2019-06-09 ENCOUNTER — Other Ambulatory Visit: Payer: Self-pay

## 2019-06-09 ENCOUNTER — Ambulatory Visit: Payer: Medicare Other | Admitting: Cardiology

## 2019-06-09 ENCOUNTER — Ambulatory Visit (HOSPITAL_COMMUNITY)
Admission: RE | Admit: 2019-06-09 | Discharge: 2019-06-09 | Disposition: A | Discharge status: Home / Self Care | Payer: Medicare Other | Source: Ambulatory Visit | Attending: Cardiology | Admitting: Cardiology

## 2019-06-09 DIAGNOSIS — I4891 Unspecified atrial fibrillation: Secondary | ICD-10-CM | POA: Diagnosis not present

## 2019-06-09 MED ORDER — IOHEXOL 350 MG/ML SOLN
80.0000 mL | Freq: Once | INTRAVENOUS | Status: AC | PRN
  Administered 2019-06-09: 80 mL via INTRAVENOUS

## 2019-06-10 ENCOUNTER — Other Ambulatory Visit (HOSPITAL_COMMUNITY)
Admission: RE | Admit: 2019-06-10 | Discharge: 2019-06-10 | Disposition: A | Discharge status: Home / Self Care | Payer: Medicare Other | Source: Ambulatory Visit | Attending: Cardiology | Admitting: Cardiology

## 2019-06-10 DIAGNOSIS — Z20828 Contact with and (suspected) exposure to other viral communicable diseases: Secondary | ICD-10-CM | POA: Insufficient documentation

## 2019-06-11 LAB — SARS CORONAVIRUS 2 (TAT 6-24 HRS): SARS Coronavirus 2: NEGATIVE

## 2019-06-12 NOTE — Anesthesia Preprocedure Evaluation (Addendum)
Anesthesia Evaluation  Patient identified by MRN, date of birth, ID band Patient awake    Reviewed: Allergy & Precautions, NPO status , Patient's Chart, lab work & pertinent test results  Airway Mallampati: III  TM Distance: >3 FB Neck ROM: Full    Dental  (+) Teeth Intact, Dental Advisory Given   Pulmonary neg pulmonary ROS,    Pulmonary exam normal breath sounds clear to auscultation       Cardiovascular hypertension, Pt. on medications + dysrhythmias Atrial Fibrillation  Rhythm:Irregular Rate:Abnormal     Neuro/Psych PSYCHIATRIC DISORDERS Depression CVA, No Residual Symptoms    GI/Hepatic Neg liver ROS, GERD  Medicated,  Endo/Other  negative endocrine ROSObesity   Renal/GU negative Renal ROS     Musculoskeletal negative musculoskeletal ROS (+)   Abdominal   Peds  Hematology  (+) Blood dyscrasia (Eliquis), ,   Anesthesia Other Findings Day of surgery medications reviewed with the patient.  Reproductive/Obstetrics                            Anesthesia Physical Anesthesia Plan  ASA: III  Anesthesia Plan: General   Post-op Pain Management:    Induction: Intravenous  PONV Risk Score and Plan: 3 and Ondansetron, Dexamethasone and Treatment may vary due to age or medical condition  Airway Management Planned: Oral ETT  Additional Equipment:   Intra-op Plan:   Post-operative Plan: Extubation in OR  Informed Consent: I have reviewed the patients History and Physical, chart, labs and discussed the procedure including the risks, benefits and alternatives for the proposed anesthesia with the patient or authorized representative who has indicated his/her understanding and acceptance.     Dental advisory given  Plan Discussed with: CRNA  Anesthesia Plan Comments:        Anesthesia Quick Evaluation

## 2019-06-13 ENCOUNTER — Ambulatory Visit (HOSPITAL_COMMUNITY): Payer: Medicare Other | Admitting: Certified Registered Nurse Anesthetist

## 2019-06-13 ENCOUNTER — Ambulatory Visit (HOSPITAL_COMMUNITY)
Admission: RE | Admit: 2019-06-13 | Discharge: 2019-06-13 | Disposition: A | Discharge status: Home / Self Care | Payer: Medicare Other | Attending: Cardiology | Admitting: Cardiology

## 2019-06-13 ENCOUNTER — Encounter (HOSPITAL_COMMUNITY): Payer: Self-pay | Admitting: Certified Registered Nurse Anesthetist

## 2019-06-13 ENCOUNTER — Other Ambulatory Visit: Payer: Self-pay

## 2019-06-13 ENCOUNTER — Encounter (HOSPITAL_COMMUNITY)
Admission: RE | Disposition: A | Discharge status: Home / Self Care | Payer: Self-pay | Source: Home / Self Care | Attending: Cardiology

## 2019-06-13 DIAGNOSIS — Z8673 Personal history of transient ischemic attack (TIA), and cerebral infarction without residual deficits: Secondary | ICD-10-CM | POA: Diagnosis not present

## 2019-06-13 DIAGNOSIS — Z7901 Long term (current) use of anticoagulants: Secondary | ICD-10-CM | POA: Insufficient documentation

## 2019-06-13 DIAGNOSIS — Z882 Allergy status to sulfonamides status: Secondary | ICD-10-CM | POA: Diagnosis not present

## 2019-06-13 DIAGNOSIS — H9190 Unspecified hearing loss, unspecified ear: Secondary | ICD-10-CM | POA: Diagnosis not present

## 2019-06-13 DIAGNOSIS — I480 Paroxysmal atrial fibrillation: Secondary | ICD-10-CM | POA: Diagnosis present

## 2019-06-13 DIAGNOSIS — R0600 Dyspnea, unspecified: Secondary | ICD-10-CM | POA: Insufficient documentation

## 2019-06-13 DIAGNOSIS — K219 Gastro-esophageal reflux disease without esophagitis: Secondary | ICD-10-CM | POA: Diagnosis not present

## 2019-06-13 DIAGNOSIS — R7303 Prediabetes: Secondary | ICD-10-CM | POA: Insufficient documentation

## 2019-06-13 DIAGNOSIS — Z79899 Other long term (current) drug therapy: Secondary | ICD-10-CM | POA: Diagnosis not present

## 2019-06-13 DIAGNOSIS — I10 Essential (primary) hypertension: Secondary | ICD-10-CM | POA: Insufficient documentation

## 2019-06-13 HISTORY — PX: ATRIAL FIBRILLATION ABLATION: EP1191

## 2019-06-13 LAB — BASIC METABOLIC PANEL
Anion gap: 10 (ref 5–15)
BUN: 18 mg/dL (ref 8–23)
CO2: 26 mmol/L (ref 22–32)
Calcium: 9.2 mg/dL (ref 8.9–10.3)
Chloride: 103 mmol/L (ref 98–111)
Creatinine, Ser: 0.95 mg/dL (ref 0.44–1.00)
GFR calc Af Amer: >60 mL/min (ref >60)
GFR calc non Af Amer: 58 mL/min — ABNORMAL LOW (ref >60)
Glucose, Bld: 105 mg/dL — ABNORMAL HIGH (ref 70–99)
Potassium: 3.4 mmol/L — ABNORMAL LOW (ref 3.5–5.1)
Sodium: 139 mmol/L (ref 135–145)

## 2019-06-13 LAB — CBC
HCT: 40.9 % (ref 36.0–46.0)
Hemoglobin: 13.7 g/dL (ref 12.0–15.0)
MCH: 32.5 pg (ref 26.0–34.0)
MCHC: 33.5 g/dL (ref 30.0–36.0)
MCV: 97.1 fL (ref 80.0–100.0)
Platelets: 181 10*3/uL (ref 150–400)
RBC: 4.21 MIL/uL (ref 3.87–5.11)
RDW: 13.2 % (ref 11.5–15.5)
WBC: 4.7 10*3/uL (ref 4.0–10.5)
nRBC: 0 % (ref 0.0–0.2)

## 2019-06-13 LAB — POCT ACTIVATED CLOTTING TIME
Activated Clotting Time: 373 seconds
Activated Clotting Time: 147 seconds

## 2019-06-13 LAB — GLUCOSE, CAPILLARY: Glucose-Capillary: 111 mg/dL — ABNORMAL HIGH (ref 70–99)

## 2019-06-13 SURGERY — ATRIAL FIBRILLATION ABLATION
Anesthesia: General

## 2019-06-13 MED ORDER — BUPIVACAINE HCL (PF) 0.25 % IJ SOLN
INTRAMUSCULAR | Status: AC
  Filled 2019-06-13: qty 30

## 2019-06-13 MED ORDER — SODIUM CHLORIDE 0.9 % IV SOLN: 250.0000 mL | INTRAVENOUS | Status: DC | PRN

## 2019-06-13 MED ORDER — HEPARIN (PORCINE) IN NACL 1000-0.9 UT/500ML-% IV SOLN
INTRAVENOUS | Status: AC
  Filled 2019-06-13: qty 500

## 2019-06-13 MED ORDER — DOBUTAMINE IN D5W 4-5 MG/ML-% IV SOLN
INTRAVENOUS | Status: AC
  Filled 2019-06-13: qty 250

## 2019-06-13 MED ORDER — ACETAMINOPHEN 325 MG PO TABS
650.0000 mg | ORAL_TABLET | ORAL | Status: DC | PRN
  Administered 2019-06-13: 650 mg via ORAL
  Filled 2019-06-13 (×2): qty 2

## 2019-06-13 MED ORDER — PROPOFOL 10 MG/ML IV BOLUS
INTRAVENOUS | Status: DC | PRN
  Administered 2019-06-13: 50 mg via INTRAVENOUS
  Administered 2019-06-13 (×2): 30 mg via INTRAVENOUS

## 2019-06-13 MED ORDER — HEPARIN (PORCINE) IN NACL 1000-0.9 UT/500ML-% IV SOLN
INTRAVENOUS | Status: DC | PRN
  Administered 2019-06-13 (×5): 500 mL

## 2019-06-13 MED ORDER — ROCURONIUM BROMIDE 50 MG/5ML IV SOSY
PREFILLED_SYRINGE | INTRAVENOUS | Status: DC | PRN
  Administered 2019-06-13: 50 mg via INTRAVENOUS

## 2019-06-13 MED ORDER — HYDRALAZINE HCL 20 MG/ML IJ SOLN
10.0000 mg | Freq: Once | INTRAMUSCULAR | Status: AC
  Administered 2019-06-13: 10 mg via INTRAVENOUS

## 2019-06-13 MED ORDER — ONDANSETRON HCL 4 MG/2ML IJ SOLN: 4.0000 mg | Freq: Four times a day (QID) | INTRAMUSCULAR | Status: DC | PRN

## 2019-06-13 MED ORDER — ACETAMINOPHEN 500 MG PO TABS
1000.0000 mg | ORAL_TABLET | Freq: Once | ORAL | Status: AC
  Administered 2019-06-13: 1000 mg via ORAL
  Filled 2019-06-13 (×2): qty 2

## 2019-06-13 MED ORDER — SODIUM CHLORIDE 0.9% FLUSH: 3.0000 mL | Freq: Two times a day (BID) | INTRAVENOUS | Status: DC

## 2019-06-13 MED ORDER — ONDANSETRON HCL 4 MG/2ML IJ SOLN
INTRAMUSCULAR | Status: DC | PRN
  Administered 2019-06-13: 4 mg via INTRAVENOUS

## 2019-06-13 MED ORDER — MIDAZOLAM HCL 2 MG/2ML IJ SOLN
INTRAMUSCULAR | Status: AC
  Filled 2019-06-13: qty 2

## 2019-06-13 MED ORDER — EPHEDRINE SULFATE-NACL 50-0.9 MG/10ML-% IV SOSY
PREFILLED_SYRINGE | INTRAVENOUS | Status: DC | PRN
  Administered 2019-06-13 (×2): 5 mg via INTRAVENOUS

## 2019-06-13 MED ORDER — PHENYLEPHRINE 40 MCG/ML (10ML) SYRINGE FOR IV PUSH (FOR BLOOD PRESSURE SUPPORT)
PREFILLED_SYRINGE | INTRAVENOUS | Status: DC | PRN
  Administered 2019-06-13: 120 ug via INTRAVENOUS

## 2019-06-13 MED ORDER — FENTANYL CITRATE (PF) 250 MCG/5ML IJ SOLN
INTRAMUSCULAR | Status: AC
  Filled 2019-06-13: qty 5

## 2019-06-13 MED ORDER — PROTAMINE SULFATE 10 MG/ML IV SOLN
INTRAVENOUS | Status: DC | PRN
  Administered 2019-06-13: 50 mg via INTRAVENOUS

## 2019-06-13 MED ORDER — HEPARIN SODIUM (PORCINE) 5000 UNIT/ML IJ SOLN
INTRAMUSCULAR | Status: DC | PRN
  Administered 2019-06-13: 13000 [IU] via SUBCUTANEOUS

## 2019-06-13 MED ORDER — SODIUM CHLORIDE 0.9% FLUSH: 3.0000 mL | INTRAVENOUS | Status: DC | PRN

## 2019-06-13 MED ORDER — DOBUTAMINE IN D5W 4-5 MG/ML-% IV SOLN
INTRAVENOUS | Status: DC | PRN
  Administered 2019-06-13: 20 ug/kg/min via INTRAVENOUS

## 2019-06-13 MED ORDER — LIDOCAINE 2% (20 MG/ML) 5 ML SYRINGE
INTRAMUSCULAR | Status: DC | PRN
  Administered 2019-06-13: 80 mg via INTRAVENOUS

## 2019-06-13 MED ORDER — HYDRALAZINE HCL 20 MG/ML IJ SOLN
INTRAMUSCULAR | Status: AC
  Filled 2019-06-13: qty 1

## 2019-06-13 MED ORDER — HEPARIN SODIUM (PORCINE) 1000 UNIT/ML IJ SOLN
INTRAMUSCULAR | Status: AC
  Filled 2019-06-13: qty 1

## 2019-06-13 MED ORDER — SODIUM CHLORIDE 0.9 % IV SOLN
INTRAVENOUS | Status: DC
  Administered 2019-06-13: 06:00:00 via INTRAVENOUS

## 2019-06-13 MED ORDER — FENTANYL CITRATE (PF) 250 MCG/5ML IJ SOLN
INTRAMUSCULAR | Status: DC | PRN
  Administered 2019-06-13: 100 ug via INTRAVENOUS

## 2019-06-13 MED ORDER — SUGAMMADEX SODIUM 200 MG/2ML IV SOLN
INTRAVENOUS | Status: DC | PRN
  Administered 2019-06-13: 200 mg via INTRAVENOUS

## 2019-06-13 MED ORDER — DEXAMETHASONE SODIUM PHOSPHATE 10 MG/ML IJ SOLN
INTRAMUSCULAR | Status: DC | PRN
  Administered 2019-06-13: 10 mg via INTRAVENOUS

## 2019-06-13 MED ORDER — BUPIVACAINE HCL (PF) 0.25 % IJ SOLN
INTRAMUSCULAR | Status: DC | PRN
  Administered 2019-06-13: 45 mL

## 2019-06-13 MED ORDER — HEPARIN SODIUM (PORCINE) 1000 UNIT/ML IJ SOLN
INTRAMUSCULAR | Status: DC | PRN
  Administered 2019-06-13: 1000 [IU] via INTRAVENOUS

## 2019-06-13 SURGICAL SUPPLY — 20 items
BLANKET WARM UNDERBOD FULL ACC (MISCELLANEOUS) ×3 IMPLANT
CATH MAPPNG PENTARAY F 2-6-2MM (CATHETERS) ×1 IMPLANT
CATH SMTCH THERMOCOOL SF DF (CATHETERS) ×3 IMPLANT
CATH SOUNDSTAR 3D IMAGING (CATHETERS) ×3 IMPLANT
CATH WEBSTER BI DIR CS D-F CRV (CATHETERS) ×3 IMPLANT
COVER SWIFTLINK CONNECTOR (BAG) ×3 IMPLANT
PACK EP LATEX FREE (CUSTOM PROCEDURE TRAY) ×2
PACK EP LF (CUSTOM PROCEDURE TRAY) ×1 IMPLANT
PAD PRO RADIOLUCENT 2001M-C (PAD) ×3 IMPLANT
PATCH CARTO3 (PAD) ×3 IMPLANT
PENTARAY F 2-6-2MM (CATHETERS) ×3
SHEATH AVANTI 11F 11CM (SHEATH) ×3 IMPLANT
SHEATH BAYLIS SUREFLEX  M 8.5 (SHEATH) ×2
SHEATH BAYLIS SUREFLEX M 8.5 (SHEATH) ×1 IMPLANT
SHEATH BAYLIS TRANSSEPTAL 98CM (NEEDLE) ×3 IMPLANT
SHEATH CARTO VIZIGO SM CVD (SHEATH) ×3 IMPLANT
SHEATH PINNACLE 7F 10CM (SHEATH) ×3 IMPLANT
SHEATH PINNACLE 8F 10CM (SHEATH) ×6 IMPLANT
SHEATH PINNACLE 9F 10CM (SHEATH) ×6 IMPLANT
TUBING SMART ABLATE COOLFLOW (TUBING) ×3 IMPLANT

## 2019-06-13 NOTE — Transfer of Care (Signed)
Immediate Anesthesia Transfer of Care Note  Patient: Emma Rivera  Procedure(s) Performed: ATRIAL FIBRILLATION ABLATION (N/A )  Patient Location: Cath Lab  Anesthesia Type:General  Level of Consciousness: awake, alert , oriented and patient cooperative  Airway & Oxygen Therapy: Patient Spontanous Breathing and Patient connected to nasal cannula oxygen  Post-op Assessment: Report given to RN and Post -op Vital signs reviewed and stable  Post vital signs: Reviewed and stable  Last Vitals:  Vitals Value Taken Time  BP 172/74 06/13/19 1031  Temp 36.4 C 06/13/19 1028  Pulse 70 06/13/19 1033  Resp 14 06/13/19 1033  SpO2 96 % 06/13/19 1033  Vitals shown include unvalidated device data.  Last Pain:  Vitals:   06/13/19 1028  TempSrc: Temporal  PainSc:          Complications: No apparent anesthesia complications

## 2019-06-13 NOTE — H&P (Signed)
PCP:  Dion Body, MD Primary Cardiologist: No primary care provider on file. Electrophysiologist: Collen Vincent Meredith Leeds, MD   Emma Rivera is a 78 y.o. female who presents today for electrophysiology followup for paroxysmal afib. Since last being seen in the Afib clinic. She is doing well.   She keeps meticulous track of her rhythm, but she only feels a difference in her pulse. She does not have palpitations, but feels an irregular pulse. She has SOB with her ADLs at baseline. She is SOB walking up about 10 stairs, and occasionally with bending over. She denies orthopnea or PND. She occasionally has a "light" feeling in her 1-2 times a week. She can't correlate this with being in sinus or afib. No focal neurological symptoms. No bleeding on Eliquis. She feels better on decreased coreg.     Past Medical History:  Diagnosis Date  . Atrial fibrillation ([place])   . Depression   . Esophageal spasm   . Esophageal spasm   . GERD (gastroesophageal reflux disease)   . Heart murmur   . HOH (hard of hearing)   . Hypertension   . Pre-diabetes   . Stroke [facility])    Past Surgical History:  Procedure Laterality Date  . CARDIAC CATHETERIZATION    . CARDIOVERSION N/A 04/01/2019   Procedure: CARDIOVERSION;  Surgeon: Dorothy Spark, MD;  Location: [facility] ENDOSCOPY;  Service: Cardiovascular;  Laterality: N/A;  . CATARACT EXTRACTION W/PHACO Left 09/09/2015   Procedure: CATARACT EXTRACTION PHACO AND INTRAOCULAR LENS PLACEMENT ([place]);  Surgeon: Lyla Glassing, MD;  Location: [facility] ORS;  Service: Ophthalmology;  Laterality: Left;  [place]   1:10.5 AP     8.6 CDE  6.05 casette lot #  [id number]  . CATARACT EXTRACTION W/PHACO Right 09/30/2015   Procedure: CATARACT EXTRACTION PHACO AND INTRAOCULAR LENS PLACEMENT (IOC);  Surgeon: Lyla Glassing, MD;  Location: [facility] ORS;  Service: Ophthalmology;  Laterality: Right;  [place] 01:00.3 AP%: 11.6 CDE: 7.00 FLUID LOT# [id number]  . COLONOSCOPY    . COLONOSCOPY WITH PROPOFOL  N/A 04/04/2017   Procedure: COLONOSCOPY WITH PROPOFOL;  Surgeon: Manya Silvas, MD;  Location: [facility] ENDOSCOPY;  Service: Endoscopy;  Laterality: N/A;  . TONSILLECTOMY      Current Facility-Administered Medications  Medication Dose Route Frequency Provider Last Rate Last Dose  . 0.9 %  sodium chloride infusion   Intravenous Continuous Constance Haw, MD 50 mL/hr at 06/13/19 0620      Allergies  Allergen Reactions  . Sulfa Antibiotics Nausea Only    Social History   Socioeconomic History  . Marital status: Married    Spouse name: Not on file  . Number of children: Not on file  . Years of education: Not on file  . Highest education level: Not on file  Occupational History  . Not on file  Social Needs  . Financial resource strain: Not on file  . Food insecurity    Worry: Not on file    Inability: Not on file  . Transportation needs    Medical: Not on file    Non-medical: Not on file  Tobacco Use  . Smoking status: Never Smoker  . Smokeless tobacco: Never Used  Substance and Sexual Activity  . Alcohol use: Yes    Comment: occasional  . Drug use: No  . Sexual activity: Not on file  Lifestyle  . Physical activity    Days per week: Not on file    Minutes per session: Not on file  . Stress: Not  on file  Relationships  . Social Musicianconnections    Talks on phone: Not on file    Gets together: Not on file    Attends religious service: Not on file    Active member of club or organization: Not on file    Attends meetings of clubs or organizations: Not on file    Relationship status: Not on file  . Intimate partner violence    Fear of current or ex partner: Not on file    Emotionally abused: Not on file    Physically abused: Not on file    Forced sexual activity: Not on file  Other Topics Concern  . Not on file  Social History Narrative  . Not on file     Review of Systems: General: No chills, fever, night sweats or weight changes  Cardiovascular:  No chest  pain, dyspnea on exertion, edema, orthopnea, palpitations, paroxysmal nocturnal dyspnea Dermatological: No rash, lesions or masses Respiratory: No cough, dyspnea Urologic: No hematuria, dysuria Abdominal: No nausea, vomiting, diarrhea, bright red blood per rectum, melena, or hematemesis Neurologic: No visual changes, weakness, changes in mental status All other systems reviewed and are otherwise negative except as noted above.  Physical Exam: Vitals:   06/13/19 0541  BP: (!) 184/91  Pulse: 60  Resp: 18  Temp: 98 F (36.7 C)  TempSrc: Oral  SpO2: 95%  Weight: 81.6 kg  Height: 5\' 2"  (1.575 m)    GEN- The patient is well appearing, alert and oriented x 3 today.   HEENT: normocephalic, atraumatic; sclera clear, conjunctiva pink; hearing intact; oropharynx clear; neck supple, no JVP Lymph- no cervical lymphadenopathy Lungs- Clear to ausculation bilaterally, normal work of breathing.  No wheezes, rales, rhonchi Heart- Regular rate and rhythm, no murmurs, rubs or gallops, PMI not laterally displaced GI- soft, non-tender, non-distended, bowel sounds present, no hepatosplenomegaly Extremities- no clubbing, cyanosis, or edema; DP/PT/radial pulses 2+ bilaterally MS- no significant deformity or atrophy Skin- warm and dry, no rash or lesion Psych- euthymic mood, full affect Neuro- strength and sensation are intact  EKG is ordered today Personal review shows NSR at 65 bpm with PR interval of 216 ms.  Assessment and Plan:  1. Paroxysmal atrial fibrillation S/p DCCV 04/01/19 with ERAF.  - Emma Rivera increase flecainide to 100 mg BID and follow closely. Discussed ablation at length today and willing to consider. We Emma Rivera schedule her for this, and re-consider at a visit prior to if she is not feeling better in sinus on flecainide.  Stop coreg with fatigue. Emma Rivera try on low dose verapamil.  Continue Eliquis 5 mg BID. She denies bleeding or missing any doses.  This patients CHA2DS2-VASc Score and  unadjusted Ischemic Stroke Rate (% per year) is equal to 9.7 % stroke rate/year from a score of 6  Above score calculated as 1 point each if present [CHF, HTN, DM, Vascular=MI/PAD/Aortic Plaque, Age if 65-74, or Female] Above score calculated as 2 points each if present [Age > 75, or Stroke/TIA/TE]  2. HTN Stable on current regimen. Med changes as above.   3. Dyspnea Likely related to afib. Plans for stress testing per Dr End noted. Normal EF on Echo 12/18/2018. Euvolemic on exam today.   Emma Allbaugh Emma LoaMartin Cobi Delph, MD  04/29/19 7:18 AM  Humberto LeepAnn B Spurr has presented today for surgery, with the diagnosis of atrial fibrillation.  The various methods of treatment have been discussed with the patient and family. After consideration of risks, benefits and other options for treatment, the  patient has consented to  Procedure(s): Catheter ablation as a surgical intervention .  Risks include but not limited to bleeding, tamponade, heart block, stroke, damage to surrounding organs, among others. The patient's history has been reviewed, patient examined, no change in status, stable for surgery.  I have reviewed the patient's chart and labs.  Questions were answered to the patient's satisfaction.    Emma Birnbaum Elberta Fortisamnitz, MD 06/13/2019 7:18 AM

## 2019-06-13 NOTE — Discharge Instructions (Signed)
Post procedure care instructions No driving for 4 days. No lifting over 5 lbs for 1 week. No sexual activity for 1 week. You may return to work on 06/20/2019. Keep procedure site clean & dry. If you notice increased pain, swelling, bleeding or pus, call/return!  You may shower, but no soaking baths/hot tubs/pools for 1 week.     Femoral Site Care This sheet gives you information about how to care for yourself after your procedure. Your health care provider may also give you more specific instructions. If you have problems or questions, contact your health care provider. What can I expect after the procedure? After the procedure, it is common to have:  Bruising that usually fades within 1-2 weeks.  Tenderness at the site. Follow these instructions at home: Wound care  Follow instructions from your health care provider about how to take care of your insertion site. Make sure you: ? Wash your hands with soap and water before you change your bandage (dressing). If soap and water are not available, use hand sanitizer. ? Change your dressing as told by your health care provider. ? Leave stitches (sutures), skin glue, or adhesive strips in place. These skin closures may need to stay in place for 2 weeks or longer. If adhesive strip edges start to loosen and curl up, you may trim the loose edges. Do not remove adhesive strips completely unless your health care provider tells you to do that.  Do not take baths, swim, or use a hot tub until your health care provider approves.  You may shower 24-48 hours after the procedure or as told by your health care provider. ? Gently wash the site with plain soap and water. ? Pat the area dry with a clean towel. ? Do not rub the site. This may cause bleeding.  Do not apply powder or lotion to the site. Keep the site clean and dry.  Check your femoral site every day for signs of infection. Check for: ? Redness, swelling, or pain. ? Fluid or  blood. ? Warmth. ? Pus or a bad smell. Activity  For the first 2-3 days after your procedure, or as long as directed: ? Avoid climbing stairs as much as possible. ? Do not squat.  Do not lift anything that is heavier than 10 lb (4.5 kg), or the limit that you are told, until your health care provider says that it is safe.  Rest as directed. ? Avoid sitting for a long time without moving. Get up to take short walks every 1-2 hours.  Do not drive for 24 hours if you were given a medicine to help you relax (sedative). General instructions  Take over-the-counter and prescription medicines only as told by your health care provider.  Keep all follow-up visits as told by your health care provider. This is important. Contact a health care provider if you have:  A fever or chills.  You have redness, swelling, or pain around your insertion site. Get help right away if:  The catheter insertion area swells very fast.  You pass out.  You suddenly start to sweat or your skin gets clammy.  The catheter insertion area is bleeding, and the bleeding does not stop when you hold steady pressure on the area.  The area near or just beyond the catheter insertion site becomes pale, cool, tingly, or numb. These symptoms may represent a serious problem that is an emergency. Do not wait to see if the symptoms will go away. Get medical  help right away. Call your local emergency services (911 in the [place] Do not drive yourself to the hospital. Summary  After the procedure, it is common to have bruising that usually fades within 1-2 weeks.  Check your femoral site every day for signs of infection.  Do not lift anything that is heavier than 10 lb (4.5 kg), or the limit that you are told, until your health care provider says that it is safe. This information is not intended to replace advice given to you by your health care provider. Make sure you discuss any questions you have with your health care  provider. Document Released: 07/03/2014 Document Revised: 11/12/2017 Document Reviewed: 11/12/2017 Elsevier Patient Education  2020 [employer]    You have an appointment set up with the Atrial Fibrillation Clinic.  Multiple studies have shown that being followed by a dedicated atrial fibrillation clinic in addition to the standard care you receive from your other physicians improves health. We believe that enrollment in the atrial fibrillation clinic will allow us to better care for you.   The phone number to the Atrial Fibrillation Clinic is [phone]. The clinic is staffed Monday through Friday from 8:30am to 5pm.  Parking Directions: The clinic is located in the Heart and Vascular Building connected to [facility]. 1)From [address] turn on to [address] and go to the 3rd entrance  (Heart and Vascular entrance) on the right. 2)Look to the right for Heart &Vascular Parking Garage. 3)A code for the entrance is required please call the clinic to receive this.   4)Take the elevators to the 1st floor. Registration is in the room with the glass walls at the end of the hallway.  If you have any trouble parking or locating the clinic, please dont hesitate to call [phone].

## 2019-06-13 NOTE — Progress Notes (Signed)
Up and walked and tolerated well; bilat groins stable, no bleeding or hematoma 

## 2019-06-13 NOTE — Progress Notes (Signed)
    2 X 9 Fr. R F/V and 1 X 7 Fr, 1 X 11 Fr. L F/V sheaths were removed and manual pressure was held until hemosttsis was obtained. Sterile gauze was applied at both sites. Both groins were soft and non tender.  R and L DP were palpable before and after the sheath pull. Bed rest started at 1230 X 6 hr. Instructions were given to patient about the bed rest.  HR 72 NSR BP 119/42 sPO2 94% on R/A

## 2019-06-13 NOTE — Anesthesia Postprocedure Evaluation (Signed)
Anesthesia Post Note  Patient: Emma Rivera  Procedure(s) Performed: ATRIAL FIBRILLATION ABLATION (N/A )     Patient location during evaluation: PACU Anesthesia Type: General Level of consciousness: awake and alert Pain management: pain level controlled Vital Signs Assessment: post-procedure vital signs reviewed and stable Respiratory status: spontaneous breathing, nonlabored ventilation, respiratory function stable and patient connected to nasal cannula oxygen Cardiovascular status: blood pressure returned to baseline and stable Postop Assessment: no apparent nausea or vomiting Anesthetic complications: no    Last Vitals:  Vitals:   06/13/19 1400 06/13/19 1430  BP: (!) 125/51 129/80  Pulse: 82 80  Resp: 18 16  Temp:    SpO2: 94% 96%    Last Pain:  Vitals:   06/13/19 1308  TempSrc: Oral  PainSc: 0-No pain                 Catalina Gravel

## 2019-06-13 NOTE — Anesthesia Procedure Notes (Signed)
Procedure Name: Intubation Date/Time: 06/13/2019 7:39 AM Performed by: Shirlyn Goltz, CRNA Pre-anesthesia Checklist: Patient identified, Emergency Drugs available, Suction available and Patient being monitored Patient Re-evaluated:Patient Re-evaluated prior to induction Oxygen Delivery Method: Circle system utilized Preoxygenation: Pre-oxygenation with 100% oxygen Induction Type: IV induction Ventilation: Mask ventilation without difficulty and Oral airway inserted - appropriate to patient size Laryngoscope Size: Mac and 3 Grade View: Grade I Tube type: Oral Tube size: 7.0 mm Number of attempts: 1 Airway Equipment and Method: Stylet Placement Confirmation: ETT inserted through vocal cords under direct vision,  positive ETCO2 and breath sounds checked- equal and bilateral Secured at: 22 cm Tube secured with: Tape Dental Injury: Teeth and Oropharynx as per pre-operative assessment

## 2019-06-16 ENCOUNTER — Encounter (HOSPITAL_COMMUNITY): Payer: Self-pay | Admitting: Cardiology

## 2019-06-17 LAB — POCT ACTIVATED CLOTTING TIME: Activated Clotting Time: 318 seconds

## 2019-07-03 ENCOUNTER — Other Ambulatory Visit: Payer: Self-pay

## 2019-07-03 ENCOUNTER — Encounter: Payer: Self-pay | Admitting: Internal Medicine

## 2019-07-03 ENCOUNTER — Telehealth (INDEPENDENT_AMBULATORY_CARE_PROVIDER_SITE_OTHER): Payer: Medicare Other | Admitting: Internal Medicine

## 2019-07-03 VITALS — BP 145/83 | HR 57 | Ht 62.0 in | Wt 180.0 lb

## 2019-07-03 DIAGNOSIS — I10 Essential (primary) hypertension: Secondary | ICD-10-CM | POA: Diagnosis not present

## 2019-07-03 DIAGNOSIS — I4819 Other persistent atrial fibrillation: Secondary | ICD-10-CM

## 2019-07-03 DIAGNOSIS — Z7901 Long term (current) use of anticoagulants: Secondary | ICD-10-CM

## 2019-07-03 DIAGNOSIS — R0789 Other chest pain: Secondary | ICD-10-CM

## 2019-07-03 MED ORDER — AMLODIPINE BESYLATE 5 MG PO TABS: 5.0000 mg | ORAL_TABLET | Freq: Every day | ORAL | 1 refills | Status: DC

## 2019-07-03 NOTE — Progress Notes (Deleted)
{Choose 1 Note Type (Telehealth Visit or Telephone Visit):[id number]}   Date:  07/03/2019   ID:  Emma Rivera, DOB December 06, 1940, MRN [id number]  {Patient Location:[phone]::"Home"} {Provider Location:[phone]::"Home"}  PCP:  Dion Body, MD  Cardiologist:  No primary care provider on file. *** Electrophysiologist:  Will Meredith Leeds, MD   Evaluation Performed:  {Choose Visit TGGY:[id number]::"JJKKXF-GH Visit"}  Chief Complaint:  ***  History of Present Illness:    Emma Rivera is a 78 y.o. female with history of stroke in 12/2018 with new diagnosis of atrial fibrillation, hypertension, GERD, and esophageal spasm, who is following up regarding atrial fibrillation.  We last spoke in April, at which time Emma Rivera reported considerable exertional dyspnea when going up a few stairs in her home.  She noted that her pulse remained irregular at that time, consistent with persistent atrial fibrillation noted on preceding cardiac monitor (99% burden).  I referred her to Dr. Curt Bears, who performed a-fib ablation on 06/13/2019 after medical therapy failed to adequately control Emma Rivera' symptoms.  The patient {does/does not:200015} have symptoms concerning for COVID-19 infection (fever, chills, cough, or new shortness of breath).    Past Medical History:  Diagnosis Date  . Atrial fibrillation ([place])   . Depression   . Esophageal spasm   . Esophageal spasm   . GERD (gastroesophageal reflux disease)   . Heart murmur   . HOH (hard of hearing)   . Hypertension   . Pre-diabetes   . Stroke [facility])    Past Surgical History:  Procedure Laterality Date  . ATRIAL FIBRILLATION ABLATION N/A 06/13/2019   Procedure: ATRIAL FIBRILLATION ABLATION;  Surgeon: Constance Haw, MD;  Location: [place] CV LAB;  Service: Cardiovascular;  Laterality: N/A;  . CARDIAC CATHETERIZATION    . CARDIOVERSION N/A 04/01/2019   Procedure: CARDIOVERSION;  Surgeon: Dorothy Spark, MD;  Location: [facility]  ENDOSCOPY;  Service: Cardiovascular;  Laterality: N/A;  . CATARACT EXTRACTION W/PHACO Left 09/09/2015   Procedure: CATARACT EXTRACTION PHACO AND INTRAOCULAR LENS PLACEMENT ([place]);  Surgeon: Lyla Glassing, MD;  Location: [facility] ORS;  Service: Ophthalmology;  Laterality: Left;  [place]   1:10.5 AP     8.6 CDE  6.05 casette lot #  [id number]  . CATARACT EXTRACTION W/PHACO Right 09/30/2015   Procedure: CATARACT EXTRACTION PHACO AND INTRAOCULAR LENS PLACEMENT ([facility]);  Surgeon: Lyla Glassing, MD;  Location: [facility] ORS;  Service: Ophthalmology;  Laterality: Right;  [place] 01:00.3 AP%: 11.6 CDE: 7.00 FLUID LOT# [id number]  . COLONOSCOPY    . COLONOSCOPY WITH PROPOFOL N/A 04/04/2017   Procedure: COLONOSCOPY WITH PROPOFOL;  Surgeon: Manya Silvas, MD;  Location[facility] ENDOSCOPY;  Service: Endoscopy;  Laterality: N/A;  . TONSILLECTOMY       No outpatient medications have been marked as taking for the 07/03/19 encounter (Appointment) with Carmen Tolliver, Harrell Gave, MD.     Allergies:   Sulfa antibiotics   Social History   Tobacco Use  . Smoking status: Never Smoker  . Smokeless tobacco: Never Used  Substance Use Topics  . Alcohol use: Yes    Comment: occasional  . Drug use: No     Family Hx: The patient's family history includes Breast cancer in her maternal aunt.  ROS:   Please see the history of present illness.    *** All other systems reviewed and are negative.   Prior CV studies:   The following studies were reviewed today:  Event monitor (01/21/2019): Predominantly atrial fibrillation with two post-termination pauses lasting up to 4.6 seconds, as  well as two episodes of likely NSVT lasting up to 18 beats.  Echo (12/18/2018):  1. The left ventricle has normal systolic function of 60-65%. The cavity size is normal. There is no increased left ventricular wall thickness. Left ventricular diastology could not be evaluated secondary to atrial fibrillation.  2. The right ventricle has normal systolic  function. The cavity in normal in size. There is no increase in right ventricular wall thickness.  3. Mildly dilated left atrial size.  4. The mitral valve is normal in structure There is mild thickening.  5. The aortic valve is tricuspid. There is mild thickening of the aortic valve.  6. Normal LV systolic function; mild TR.   Labs/Other Tests and Data Reviewed:    EKG:  {EKG/Telemetry Strips Reviewed:[phone]}  Recent Labs: 12/16/2018: ALT 22 06/13/2019: BUN 18; Creatinine, Ser 0.95; Hemoglobin 13.7; Platelets 181; Potassium 3.4; Sodium 139   Recent Lipid Panel Lab Results  Component Value Date/Time   CHOL 164 12/17/2018 05:07 AM   TRIG 54 12/17/2018 05:07 AM   HDL 56 12/17/2018 05:07 AM   CHOLHDL 2.9 12/17/2018 05:07 AM   LDLCALC 97 12/17/2018 05:07 AM    Wt Readings from Last 3 Encounters:  06/13/19 180 lb (81.6 kg)  05/15/19 181 lb 12.8 oz (82.5 kg)  05/12/19 180 lb (81.6 kg)     Objective:    Vital Signs:  There were no vitals taken for this visit.   {[facility] Virtual Exam (Optional):[phone]::"VITAL SIGNS:  reviewed"}  ASSESSMENT & PLAN:    1. ***  COVID-19 Education: The signs and symptoms of COVID-19 were discussed with the patient and how to seek care for testing (follow up with PCP or arrange E-visit).  ***The importance of social distancing was discussed today.  Time:   Today, I have spent *** minutes with the patient with telehealth technology discussing the above problems.     Medication Adjustments/Labs and Tests Ordered: Current medicines are reviewed at length with the patient today.  Concerns regarding medicines are outlined above.   Tests Ordered: No orders of the defined types were placed in this encounter.   Medication Changes: No orders of the defined types were placed in this encounter.   Follow Up:  {F/U [phone]} {follow up:15908}  Signed, Yvonne Kendallhristopher Makinley Muscato, MD  07/03/2019 12:21 PM    [facility]  HeartCare

## 2019-07-03 NOTE — Patient Instructions (Addendum)
Medication Instructions:  Your physician has recommended you make the following change in your medication:  1- START Amlodipine 5 mg by mouth once a day.  If you need a refill on your cardiac medications before your next appointment, please call your pharmacy.   Lab work: - None ordered.  If you have labs (blood work) drawn today and your tests are completely normal, you will receive your results only by: Marland Kitchen. MyChart Message (if you have MyChart) OR . A paper copy in the mail If you have any lab test that is abnormal or we need to change your treatment, we will call you to review the results.  Testing/Procedures: Your physician has requested that you have a lexiscan myoview. For further information please visit [email or web address]. Please follow instruction sheet, as given.  [facility] MYOVIEW  Your caregiver has ordered a Stress Test with nuclear imaging. The purpose of this test is to evaluate the blood supply to your heart muscle. This procedure is referred to as a "Non-Invasive Stress Test." This is because other than having an IV started in your vein, nothing is inserted or "invades" your body. Cardiac stress tests are done to find areas of poor blood flow to the heart by determining the extent of coronary artery disease (CAD). Some patients exercise on a treadmill, which naturally increases the blood flow to your heart, while others who are  unable to walk on a treadmill due to physical limitations have a pharmacologic/chemical stress agent called Lexiscan . This medicine will mimic walking on a treadmill by temporarily increasing your coronary blood flow.   Please note: these test may take anywhere between 2-4 hours to complete  PLEASE REPORT TO [facility]  THE VOLUNTEERS AT THE FIRST DESK WILL DIRECT YOU WHERE TO GO  Date of Procedure:_____________________________________  Arrival Time for Procedure:______________________________   PLEASE NOTIFY THE OFFICE AT LEAST 24  HOURS IN ADVANCE IF YOU ARE UNABLE TO KEEP YOUR APPOINTMENT.  [phone] AND  PLEASE NOTIFY [facility] AT [facility] AT LEAST 24 HOURS IN ADVANCE IF YOU ARE UNABLE TO KEEP YOUR APPOINTMENT. [phone]  How to prepare for your Myoview test:  1. Do not eat or drink after midnight 2. No caffeine for 24 hours prior to test 3. No smoking 24 hours prior to test. 4. Your medication may be taken with water.  If your doctor stopped a medication because of this test, do not take that medication. 5. Ladies, please do not wear dresses.  Skirts or pants are appropriate. Please wear a short sleeve shirt. 6. No perfume, cologne or lotion. 7. Wear comfortable walking shoes. No heels!   Follow-Up: At [facility], you and your health needs are our priority.  As part of our continuing mission to provide you with exceptional heart care, we have created designated Provider Care Teams.  These Care Teams include your primary Cardiologist (physician) and Advanced Practice Providers (APPs -  Physician Assistants and Nurse Practitioners) who all work together to provide you with the care you need, when you need it. You will need a follow up appointment in 4-6 weeks IN OFFICE.   You may see DR Cristal DeerHRISTOPHER END or one of the following Advanced Practice Providers on your designated Care Team:   Nicolasa Duckinghristopher Berge, NP Eula Listenyan Dunn, PA-C . Marisue IvanJacquelyn Visser, PA-C    Cardiac Nuclear Scan A cardiac nuclear scan is a test that measures blood flow to the heart when a person is resting and when he or she is exercising.  The test looks for problems such as:  Not enough blood reaching a portion of the heart.  The heart muscle not working normally. You may need this test if:  You have heart disease.  You have had abnormal lab results.  You have had heart surgery or a balloon procedure to open up blocked arteries (angioplasty).  You have chest pain.  You have shortness of breath. In this test, a radioactive dye  (tracer) is injected into your bloodstream. After the tracer has traveled to your heart, an imaging device is used to measure how much of the tracer is absorbed by or distributed to various areas of your heart. This procedure is usually done at a hospital and takes 2-4 hours. Tell a health care provider about:  Any allergies you have.  All medicines you are taking, including vitamins, herbs, eye drops, creams, and over-the-counter medicines.  Any problems you or family members have had with anesthetic medicines.  Any blood disorders you have.  Any surgeries you have had.  Any medical conditions you have.  Whether you are pregnant or may be pregnant. What are the risks? Generally, this is a safe procedure. However, problems may occur, including:  Serious chest pain and heart attack. This is only a risk if the stress portion of the test is done.  Rapid heartbeat.  Sensation of warmth in your chest. This usually passes quickly.  Allergic reaction to the tracer. What happens before the procedure?  Ask your health care provider about changing or stopping your regular medicines. This is especially important if you are taking diabetes medicines or blood thinners.  Follow instructions from your health care provider about eating or drinking restrictions.  Remove your jewelry on the day of the procedure. What happens during the procedure?  An IV will be inserted into one of your veins.  Your health care provider will inject a small amount of radioactive tracer through the IV.  You will wait for 20-40 minutes while the tracer travels through your bloodstream.  Your heart activity will be monitored with an electrocardiogram (ECG).  You will lie down on an exam table.  Images of your heart will be taken for about 15-20 minutes.  You may also have a stress test. For this test, one of the following may be done: ? You will exercise on a treadmill or stationary bike. While you  exercise, your heart's activity will be monitored with an ECG, and your blood pressure will be checked. ? You will be given medicines that will increase blood flow to parts of your heart. This is done if you are unable to exercise.  When blood flow to your heart has peaked, a tracer will again be injected through the IV.  After 20-40 minutes, you will get back on the exam table and have more images taken of your heart.  Depending on the type of tracer used, scans may need to be repeated 3-4 hours later.  Your IV line will be removed when the procedure is over. The procedure may vary among health care providers and hospitals. What happens after the procedure?  Unless your health care provider tells you otherwise, you may return to your normal schedule, including diet, activities, and medicines.  Unless your health care provider tells you otherwise, you may increase your fluid intake. This will help to flush the contrast dye from your body. Drink enough fluid to keep your urine pale yellow.  Ask your health care provider, or the department that is  doing the test: ? When will my results be ready? ? How will I get my results? Summary  A cardiac nuclear scan measures the blood flow to the heart when a person is resting and when he or she is exercising.  Tell your health care provider if you are pregnant.  Before the procedure, ask your health care provider about changing or stopping your regular medicines. This is especially important if you are taking diabetes medicines or blood thinners.  After the procedure, unless your health care provider tells you otherwise, increase your fluid intake. This will help flush the contrast dye from your body.  After the procedure, unless your health care provider tells you otherwise, you may return to your normal schedule, including diet, activities, and medicines. This information is not intended to replace advice given to you by your health care  provider. Make sure you discuss any questions you have with your health care provider. Document Released: 11/24/2004 Document Revised: 04/15/2018 Document Reviewed: 04/15/2018 Elsevier Patient Education  2020 [employer].

## 2019-07-03 NOTE — Progress Notes (Signed)
Virtual Visit via Video Note   This visit type was conducted due to national recommendations for restrictions regarding the COVID-19 Pandemic (e.g. social distancing) in an effort to limit this patient's exposure and mitigate transmission in our community.  Due to her co-morbid illnesses, this patient is at least at moderate risk for complications without adequate follow up.  This format is felt to be most appropriate for this patient at this time.  All issues noted in this document were discussed and addressed.  A limited physical exam was performed with this format.  Please refer to the patient's chart for her consent to telehealth for [facility].   Date:  07/04/2019   ID:  Emma Rivera, DOB 11/30/1940, MRN [id number]  Patient Location: Home Provider Location: Office  PCP:  Marisue IvanLinthavong, [place], MD  Cardiologist:  Yvonne Kendallhristopher Veldon Wager, MD Electrophysiologist:  Regan LemmingWill Martin Camnitz, MD   Evaluation Performed:  Follow-Up Visit  Chief Complaint: Chest pain  History of Present Illness:    Emma Rivera is a 78 y.o. female with history of stroke in 12/2018 with new diagnosis of atrial fibrillation, hypertension, GERD, and esophageal spasm, who is following up regarding atrial fibrillation.  We last spoke in April, at which time Ms. [name] reported considerable exertional dyspnea when going up a few stairs in her home.  She noted that her pulse remained irregular at that time, consistent with persistent atrial fibrillation noted on preceding cardiac monitor (99% burden).  I referred her to Dr. Elberta Fortisamnitz, who performed a-fib ablation on 06/13/2019 after medical therapy failed to adequately control Ms. [name]' symptoms.  Today, Ms. [name] reports feeling better following her atrial fibrillation ablation with more energy.  She has been trying to walk some but notes that she gets a burning sensation as well as heaviness under her sternum when she walks.  It resolves promptly with rest.  She has not had any  discomfort at rest.  She denies associated symptoms such as palpitations, dyspnea, and nausea.  She notes some swelling in her hands and feet, which comes and goes.  Overall, her weight has been stable.  Blood pressure has been running around 140 mmHg systolic and at times even a little bit higher than this.  She notes that she was on amlodipine in the past, though this was discontinued while undergoing medical therapy for her atrial fibrillation.  She had been tolerating this medication well in the past.  She remains on apixaban without bleeding.  The patient does not have symptoms concerning for COVID-19 infection (fever, chills, cough, or new shortness of breath).    Past Medical History:  Diagnosis Date   Atrial fibrillation (HCC)    Depression    Esophageal spasm    Esophageal spasm    GERD (gastroesophageal reflux disease)    Heart murmur    HOH (hard of hearing)    Hypertension    Pre-diabetes    Stroke [facility](HCC)    Past Surgical History:  Procedure Laterality Date   ATRIAL FIBRILLATION ABLATION N/A 06/13/2019   Procedure: ATRIAL FIBRILLATION ABLATION;  Surgeon: Regan Lemmingamnitz, Will Martin, MD;  Location: [facility] INVASIVE CV LAB;  Service: Cardiovascular;  Laterality: N/A;   CARDIAC CATHETERIZATION     CARDIOVERSION N/A 04/01/2019   Procedure: CARDIOVERSION;  Surgeon: Lars MassonNelson, Katarina H, MD;  Location: [facility] ENDOSCOPY;  Service: Cardiovascular;  Laterality: N/A;   CATARACT EXTRACTION W/PHACO Left 09/09/2015   Procedure: CATARACT EXTRACTION PHACO AND INTRAOCULAR LENS PLACEMENT (IOC);  Surgeon: Lia HoppingNisha Mukherjee, MD;  Location:  [facility] ORS;  Service: Ophthalmology;  Laterality: Left;  [place]   1:10.5 AP     8.6 CDE  6.05 casette lot #  [id number]   CATARACT EXTRACTION W/PHACO Right 09/30/2015   Procedure: CATARACT EXTRACTION PHACO AND INTRAOCULAR LENS PLACEMENT (IOC);  Surgeon: Lyla Glassing, MD;  Location: [facility] ORS;  Service: Ophthalmology;  Laterality: Right;  [place] 01:00.3 AP%: 11.6 CDE:  7.00 FLUID LOT# [id number]   COLONOSCOPY     COLONOSCOPY WITH PROPOFOL N/A 04/04/2017   Procedure: COLONOSCOPY WITH PROPOFOL;  Surgeon: Manya Silvas, MD;  Location: [facility] ENDOSCOPY;  Service: Endoscopy;  Laterality: N/A;   TONSILLECTOMY       Current Meds  Medication Sig   acetaminophen (TYLENOL) 500 MG tablet Take 1,000 mg by mouth every 6 (six) hours as needed for moderate pain or headache.   apixaban (ELIQUIS) 5 MG TABS tablet Take 1 tablet (5 mg total) by mouth 2 (two) times daily.   atorvastatin (LIPITOR) 20 MG tablet TAKE 1 TABLET (20 MG TOTAL) BY MOUTH DAILY AT 6 PM.   Carboxymethylcellul-Glycerin (LUBRICATING EYE DROPS OP) Place 1 drop into both eyes daily as needed (dry eyes).   citalopram (CELEXA) 20 MG tablet Take 20 mg by mouth daily.   hydrochlorothiazide (HYDRODIURIL) 25 MG tablet Take 25 mg by mouth daily.   loratadine (CLARITIN) 10 MG tablet Take 10 mg by mouth daily as needed for allergies.   losartan (COZAAR) 100 MG tablet Take 100 mg by mouth every evening.    Multiple Vitamin (MULTIVITAMIN) tablet Take 1 tablet by mouth daily.   neomycin-bacitracin-polymyxin (NEOSPORIN) ointment Apply 1 application topically as needed for wound care.   omeprazole (PRILOSEC) 20 MG capsule Take 20 mg by mouth daily.   tolterodine (DETROL LA) 2 MG 24 hr capsule Take 2 mg by mouth 2 (two) times daily.      Allergies:   Sulfa antibiotics   Social History   Tobacco Use   Smoking status: Never Smoker   Smokeless tobacco: Never Used  Substance Use Topics   Alcohol use: Yes    Comment: occasional   Drug use: No     Family Hx: The patient's family history includes Breast cancer in her maternal aunt.  ROS:   Please see the history of present illness.   All other systems reviewed and are negative.   Prior CV studies:   The following studies were reviewed today:  Event monitor (01/21/2019): Predominantly atrial fibrillation with two post-termination pauses  lasting up to 4.6 seconds, as well as two episodes of likely NSVT lasting up to 18 beats.  Echo (12/18/2018):  1. The left ventricle has normal systolic function of 19-14%. The cavity size is normal. There is no increased left ventricular wall thickness. Left ventricular diastology could not be evaluated secondary to atrial fibrillation.  2. The right ventricle has normal systolic function. The cavity in normal in size. There is no increase in right ventricular wall thickness.  3. Mildly dilated left atrial size.  4. The mitral valve is normal in structure There is mild thickening.  5. The aortic valve is tricuspid. There is mild thickening of the aortic valve.  6. Normal LV systolic function; mild TR.   Labs/Other Tests and Data Reviewed:    EKG:  An ECG dated 06/13/2019 was personally reviewed today and demonstrated:  Normal sinus rhythm without abnormality.  Recent Labs: 12/16/2018: ALT 22 06/13/2019: BUN 18; Creatinine, Ser 0.95; Hemoglobin 13.7; Platelets 181; Potassium 3.4; Sodium 139   Recent  Lipid Panel Lab Results  Component Value Date/Time   CHOL 164 12/17/2018 05:07 AM   TRIG 54 12/17/2018 05:07 AM   HDL 56 12/17/2018 05:07 AM   CHOLHDL 2.9 12/17/2018 05:07 AM   LDLCALC 97 12/17/2018 05:07 AM    Wt Readings from Last 3 Encounters:  07/03/19 180 lb (81.6 kg)  06/13/19 180 lb (81.6 kg)  05/15/19 181 lb 12.8 oz (82.5 kg)     Objective:    Vital Signs:  BP (!) 145/83    Pulse (!) 57    Ht 5\' 2"  (1.575 m)    Wt 180 lb (81.6 kg)    BMI 32.92 kg/m    VITAL SIGNS:  reviewed GEN:  no acute distress  ASSESSMENT & PLAN:    Persistent atrial fibrillation: Ms. [name] reports less dyspnea and fatigue following atrial fibrillation ablation on 06/13/2019.  She remains compliant with apixaban, which she will need to remain on given history of stroke, unless otherwise directed by Dr. Elberta Fortisamnitz.  She is scheduled for follow-up in the atrial fibrillation clinic next month.  Chest  pain: Ms. [name] reports exertional burning and heaviness that began after atrial fibrillation ablation.  She has not had a formal ischemic evaluation, though cardiac CTA for planning of atrial fibrillation ablation did not identify any obvious coronary disease.  We have agreed to perform a pharmacologic myocardial perfusion stress test.  I will also add amlodipine 5 mg daily for improved blood pressure control as well as antianginal therapy.  I will reach out to Dr. Elberta Fortisamnitz to see if he feels like this could be related to recent atrial fibrillation ablation and if any further testing/intervention is necessary from his standpoint.  Hypertension: Blood pressure mildly elevated at home.  We will continue current doses of HCTZ and losartan, as well as restart amlodipine 5 mg daily.  COVID-19 Education: The signs and symptoms of COVID-19 were discussed with the patient and how to seek care for testing (follow up with PCP or arrange E-visit).  The importance of social distancing was discussed today.  Time:   Today, I have spent 20 minutes with the patient with telehealth technology discussing the above problems.     Medication Adjustments/Labs and Tests Ordered: Current medicines are reviewed at length with the patient today.  Concerns regarding medicines are outlined above.   Tests Ordered: Orders Placed This Encounter  Procedures   NM Myocar Multi W/Spect W/Wall Motion / EF    Medication Changes: Meds ordered this encounter  Medications   amLODipine (NORVASC) 5 MG tablet    Sig: Take 1 tablet (5 mg total) by mouth daily.    Dispense:  90 tablet    Refill:  1    Follow Up:  In Person in 4-6 week(s)  Signed, Yvonne Kendallhristopher Alizah Sills, MD  07/04/2019 7:16 AM    [facility]

## 2019-07-14 ENCOUNTER — Ambulatory Visit (HOSPITAL_COMMUNITY)
Admission: RE | Admit: 2019-07-14 | Discharge: 2019-07-14 | Disposition: A | Discharge status: Home / Self Care | Payer: Medicare Other | Source: Ambulatory Visit | Attending: Physician Assistant | Admitting: Physician Assistant

## 2019-07-14 ENCOUNTER — Encounter (HOSPITAL_COMMUNITY): Payer: Self-pay | Admitting: Physician Assistant

## 2019-07-14 ENCOUNTER — Other Ambulatory Visit: Payer: Self-pay

## 2019-07-14 VITALS — BP 124/68 | HR 67 | Ht 62.0 in | Wt 181.4 lb

## 2019-07-14 DIAGNOSIS — R7303 Prediabetes: Secondary | ICD-10-CM | POA: Insufficient documentation

## 2019-07-14 DIAGNOSIS — Z7901 Long term (current) use of anticoagulants: Secondary | ICD-10-CM | POA: Diagnosis not present

## 2019-07-14 DIAGNOSIS — E669 Obesity, unspecified: Secondary | ICD-10-CM | POA: Insufficient documentation

## 2019-07-14 DIAGNOSIS — I10 Essential (primary) hypertension: Secondary | ICD-10-CM | POA: Diagnosis not present

## 2019-07-14 DIAGNOSIS — Z79899 Other long term (current) drug therapy: Secondary | ICD-10-CM | POA: Diagnosis not present

## 2019-07-14 DIAGNOSIS — R011 Cardiac murmur, unspecified: Secondary | ICD-10-CM | POA: Diagnosis not present

## 2019-07-14 DIAGNOSIS — Z6833 Body mass index (BMI) 33.0-33.9, adult: Secondary | ICD-10-CM | POA: Insufficient documentation

## 2019-07-14 DIAGNOSIS — K219 Gastro-esophageal reflux disease without esophagitis: Secondary | ICD-10-CM | POA: Diagnosis not present

## 2019-07-14 DIAGNOSIS — Z8673 Personal history of transient ischemic attack (TIA), and cerebral infarction without residual deficits: Secondary | ICD-10-CM | POA: Insufficient documentation

## 2019-07-14 DIAGNOSIS — I4819 Other persistent atrial fibrillation: Secondary | ICD-10-CM | POA: Insufficient documentation

## 2019-07-14 DIAGNOSIS — F329 Major depressive disorder, single episode, unspecified: Secondary | ICD-10-CM | POA: Diagnosis not present

## 2019-07-14 DIAGNOSIS — R0600 Dyspnea, unspecified: Secondary | ICD-10-CM | POA: Diagnosis not present

## 2019-07-14 NOTE — Progress Notes (Signed)
Primary Care Physician: Emma Body, MD Primary Cardiologist: Dr End Primary Electrophysiologist: Dr Emma Rivera Referring Physician: Dr Emma Rivera is a 78 y.o. female with a history of persistent atrial fibrillation, hypertension, GERD, CVA, and esophageal spasm who presents for follow up in the [facility]. Patient is currently on flecainide and Eliquis. She underwent DCCV on 04/01/19 but began to have symptoms of SOB on 04/11/19 which has typically been associated with her afib in the past. She brings in her BP and HR log which shows rates have been well controlled even in afib. She continues to have fatigue and SOB. She denies snoring or significant alcohol use.  Patient is s/p ablation with Dr Emma Rivera 06/13/19. Patient reports that she is doing very well since her procedure. She states that her energy level is increased and that she feels she "has her life back." She denies CP, swallowing, or groin issues.  Today, she denies symptoms of palpitations, chest pain, orthopnea, PND, lower extremity edema, presyncope, syncope, snoring, daytime somnolence, bleeding, or neurologic sequela. The patient is tolerating medications without difficulties and is otherwise without complaint today.    Atrial Fibrillation Risk Factors:  she does not have symptoms or diagnosis of sleep apnea. she does not have a history of rheumatic fever. she does not have a history of alcohol use. The patient does not have a history of early familial atrial fibrillation or other arrhythmias.  she has a BMI of Rivera mass index is 33.18 kg/m.Marland Kitchen Filed Weights   07/14/19 0957  Weight: 82.3 kg    Family History  Problem Relation Age of Onset  . Breast cancer Maternal Aunt      Atrial Fibrillation Management history:  Previous antiarrhythmic drugs: flecainide Previous cardioversions: 04/03/19 Previous ablations: 06/13/19 CHADS2VASC score: 69 (age, female, CVA, HTN)  Anticoagulation history: Eliquis   Past Medical History:  Diagnosis Date  . Atrial fibrillation ([place])   . Depression   . Esophageal spasm   . Esophageal spasm   . GERD (gastroesophageal reflux disease)   . Heart murmur   . HOH (hard of hearing)   . Hypertension   . Pre-diabetes   . Stroke [facility]    Past Surgical History:  Procedure Laterality Date  . ATRIAL FIBRILLATION ABLATION N/A 06/13/2019   Procedure: ATRIAL FIBRILLATION ABLATION;  Surgeon: Constance Haw, MD;  Location: [place] CV LAB;  Service: Cardiovascular;  Laterality: N/A;  . CARDIAC CATHETERIZATION    . CARDIOVERSION N/A 04/01/2019   Procedure: CARDIOVERSION;  Surgeon: Dorothy Spark, MD;  Location: [facility] ENDOSCOPY;  Service: Cardiovascular;  Laterality: N/A;  . CATARACT EXTRACTION W/PHACO Left 09/09/2015   Procedure: CATARACT EXTRACTION PHACO AND INTRAOCULAR LENS PLACEMENT ([place]);  Surgeon: Lyla Glassing, MD;  Location: [facility] ORS;  Service: Ophthalmology;  Laterality: Left;  [place]   1:10.5 AP     8.6 CDE  6.05 casette lot #  [id number]  . CATARACT EXTRACTION W/PHACO Right 09/30/2015   Procedure: CATARACT EXTRACTION PHACO AND INTRAOCULAR LENS PLACEMENT (IOC);  Surgeon: Lyla Glassing, MD;  Location: [facility] ORS;  Service: Ophthalmology;  Laterality: Right;  [place] 01:00.3 AP%: 11.6 CDE: 7.00 FLUID LOT# [id number]  . COLONOSCOPY    . COLONOSCOPY WITH PROPOFOL N/A 04/04/2017   Procedure: COLONOSCOPY WITH PROPOFOL;  Surgeon: Manya Silvas, MD;  Location: [facility] ENDOSCOPY;  Service: Endoscopy;  Laterality: N/A;  . TONSILLECTOMY      Current Outpatient Medications  Medication Sig Dispense Refill  . acetaminophen (TYLENOL)  500 MG tablet Take 1,000 mg by mouth every 6 (six) hours as needed for moderate pain or headache.    Marland Kitchen amLODipine (NORVASC) 5 MG tablet Take 1 tablet (5 mg total) by mouth daily. 90 tablet 1  . apixaban (ELIQUIS) 5 MG TABS tablet Take 1 tablet (5 mg total) by mouth 2 (two) times daily. 60 tablet 0  .  atorvastatin (LIPITOR) 20 MG tablet TAKE 1 TABLET (20 MG TOTAL) BY MOUTH DAILY AT 6 PM. 90 tablet 2  . Carboxymethylcellul-Glycerin (LUBRICATING EYE DROPS OP) Place 1 drop into both eyes daily as needed (dry eyes).    . citalopram (CELEXA) 20 MG tablet Take 20 mg by mouth daily.    . hydrochlorothiazide (HYDRODIURIL) 25 MG tablet Take 25 mg by mouth daily.    Marland Kitchen loratadine (CLARITIN) 10 MG tablet Take 10 mg by mouth daily as needed for allergies.    Marland Kitchen losartan (COZAAR) 100 MG tablet Take 100 mg by mouth every evening.     . Multiple Vitamin (MULTIVITAMIN) tablet Take 1 tablet by mouth daily.    Marland Kitchen neomycin-bacitracin-polymyxin (NEOSPORIN) ointment Apply 1 application topically as needed for wound care.    Marland Kitchen omeprazole (PRILOSEC) 20 MG capsule Take 20 mg by mouth daily.    Marland Kitchen tolterodine (DETROL LA) 2 MG 24 hr capsule Take 2 mg by mouth 2 (two) times daily.      No current facility-administered medications for this encounter.     Allergies  Allergen Reactions  . Sulfa Antibiotics Nausea Only    Social History   Socioeconomic History  . Marital status: Married    Spouse name: Not on file  . Number of children: Not on file  . Years of education: Not on file  . Highest education level: Not on file  Occupational History  . Not on file  Social Needs  . Financial resource strain: Not on file  . Food insecurity    Worry: Not on file    Inability: Not on file  . Transportation needs    Medical: Not on file    Non-medical: Not on file  Tobacco Use  . Smoking status: Never Smoker  . Smokeless tobacco: Never Used  Substance and Sexual Activity  . Alcohol use: Yes    Comment: occasional  . Drug use: No  . Sexual activity: Not on file  Lifestyle  . Physical activity    Days per week: Not on file    Minutes per session: Not on file  . Stress: Not on file  Relationships  . Social Musician on phone: Not on file    Gets together: Not on file    Attends religious service:  Not on file    Active member of club or organization: Not on file    Attends meetings of clubs or organizations: Not on file    Relationship status: Not on file  . Intimate partner violence    Fear of current or ex partner: Not on file    Emotionally abused: Not on file    Physically abused: Not on file    Forced sexual activity: Not on file  Other Topics Concern  . Not on file  Social History Narrative  . Not on file     ROS- All systems are reviewed and negative except as per the HPI above.  Physical Exam: Vitals:   07/14/19 0957  BP: 124/68  Pulse: 67  Weight: 82.3 kg  Height: 5\' 2"  (1.575  m)    GEN- The patient is well appearing obese elderly female, alert and oriented x 3 today.   HEENT-head normocephalic, atraumatic, sclera clear, conjunctiva pink, hearing intact, trachea midline. Lungs- Clear to ausculation bilaterally, normal work of breathing Heart- Regular rate and rhythm, no murmurs, rubs or gallops  GI- soft, NT, ND, + BS Extremities- no clubbing, cyanosis, or edema MS- no significant deformity or atrophy Skin- no rash or lesion Psych- euthymic mood, full affect Neuro- strength and sensation are intact   Wt Readings from Last 3 Encounters:  07/14/19 82.3 kg  07/03/19 81.6 kg  06/13/19 81.6 kg    EKG today demonstrates SR HR 67, PR 160, QRS 72, QTc 429  Echo 12/18/18 demonstrated   1. The left ventricle has normal systolic function of 60-65%. The cavity size is normal. There is no increased left ventricular wall thickness. Left ventricular diastology could not be evaluated secondary to atrial fibrillation.  2. The right ventricle has normal systolic function. The cavity in normal in size. There is no increase in right ventricular wall thickness.  3. Mildly dilated left atrial size.  4. The mitral valve is normal in structure There is mild thickening.  5. The aortic valve is tricuspid. There is mild thickening of the aortic valve.  6. Normal LV systolic  function; mild TR.  Epic records are reviewed at length today  Assessment and Plan:  1. Persistent atrial fibrillation S/p DCCV 04/01/19 with ERAF. Now s/p ablation with Dr Elberta Fortisamnitz 06/13/19. Patient appears to be maintaining SR. Continue Eliquis 5 mg BID Lifestyle changes as below.  This patients CHA2DS2-VASc Score and unadjusted Ischemic Stroke Rate (% per year) is equal to 9.7 % stroke rate/year from a score of 6  Above score calculated as 1 point each if present [CHF, HTN, DM, Vascular=MI/PAD/Aortic Plaque, Age if 65-74, or Female] Above score calculated as 2 points each if present [Age > 75, or Stroke/TIA/TE]   2. Obesity Rivera mass index is 33.18 kg/m. Lifestyle modification was discussed and encouraged including regular physical activity and weight reduction.  3. HTN Stable, no changes today.  4. Dyspnea Persistent despite return to SR. Plans for stress testing per Dr End noted. No signs of fluid overload.   Follow up with Dr End and Dr Elberta Fortisamnitz as scheduled.    Jorja Loaicky Willeen Novak PA-C [facility] [address] [place], [place] [address] [phone] 07/14/2019 10:21 AM

## 2019-07-18 ENCOUNTER — Other Ambulatory Visit: Payer: Self-pay

## 2019-07-18 ENCOUNTER — Ambulatory Visit
Admission: RE | Admit: 2019-07-18 | Discharge: 2019-07-18 | Disposition: A | Discharge status: Home / Self Care | Payer: Medicare Other | Source: Ambulatory Visit | Attending: Internal Medicine | Admitting: Internal Medicine

## 2019-07-18 DIAGNOSIS — R0789 Other chest pain: Secondary | ICD-10-CM | POA: Insufficient documentation

## 2019-07-18 LAB — NM MYOCAR MULTI W/SPECT W/WALL MOTION / EF
Estimated workload: 1 METS
Exercise duration (min): 0 min
Exercise duration (sec): 0 s
LV dias vol: 56 mL (ref 46–106)
LV sys vol: 18 mL
MPHR: 124 {beats}/min
Peak HR: 77 {beats}/min
Percent HR: 62 %
Rest HR: 62 {beats}/min
SDS: 0
SRS: 3
SSS: 6
TID: 1.18

## 2019-07-18 MED ORDER — TECHNETIUM TC 99M TETROFOSMIN IV KIT
11.2600 | PACK | Freq: Once | INTRAVENOUS | Status: AC | PRN
  Administered 2019-07-18: 09:00:00 11.26 via INTRAVENOUS

## 2019-07-18 MED ORDER — TECHNETIUM TC 99M TETROFOSMIN IV KIT
32.0000 | PACK | Freq: Once | INTRAVENOUS | Status: AC | PRN
  Administered 2019-07-18: 32.264 via INTRAVENOUS

## 2019-07-18 MED ORDER — TECHNETIUM TC 99M SESTAMIBI - CARDIOLITE: 11.2600 | Freq: Once | INTRAVENOUS | Status: DC | PRN

## 2019-07-18 MED ORDER — REGADENOSON 0.4 MG/5ML IV SOLN
0.4000 mg | Freq: Once | INTRAVENOUS | Status: AC
  Administered 2019-07-18: 0.4 mg via INTRAVENOUS

## 2019-08-07 ENCOUNTER — Other Ambulatory Visit: Payer: Self-pay | Admitting: Cardiology

## 2019-08-13 ENCOUNTER — Encounter: Payer: Self-pay | Admitting: Internal Medicine

## 2019-08-13 ENCOUNTER — Other Ambulatory Visit: Payer: Self-pay

## 2019-08-13 ENCOUNTER — Ambulatory Visit (INDEPENDENT_AMBULATORY_CARE_PROVIDER_SITE_OTHER): Payer: Medicare Other | Admitting: Internal Medicine

## 2019-08-13 VITALS — BP 130/78 | HR 70 | Ht 62.0 in | Wt 181.2 lb

## 2019-08-13 DIAGNOSIS — I10 Essential (primary) hypertension: Secondary | ICD-10-CM | POA: Diagnosis not present

## 2019-08-13 DIAGNOSIS — I493 Ventricular premature depolarization: Secondary | ICD-10-CM | POA: Diagnosis not present

## 2019-08-13 DIAGNOSIS — R0609 Other forms of dyspnea: Secondary | ICD-10-CM

## 2019-08-13 DIAGNOSIS — I4819 Other persistent atrial fibrillation: Secondary | ICD-10-CM | POA: Diagnosis not present

## 2019-08-13 NOTE — Progress Notes (Signed)
Follow-up Outpatient Visit Date: 08/13/2019  Primary Care Provider: [name], MD [phone] [facility] [address] [facility] [place] [address]  Chief Complaint: Follow-up atrial fibrillation  HPI:  Ms. Hook is a 78 y.o. year-old female with history of stroke (12/2018) and subsequent diagnosis of paroxysmal atrial fibrillation, hypertension, GERD, and esophageal spasm, who presents for follow-up of atrial fibrillation.  We last spoke in late August via virtual visit, at which time Ms. Palm reported feeling better with more energy following her atrial fibrillation ablation on 06/13/2019.  However, she noted occasional burning and heaviness under her sternum when walking.  Subsequent myocardial perfusion stress test was low risk without ischemia.  Today, Ms. Pancake reports that she has been feeling "really good."  Chest discomfort noted at our last visit has resolved.  She has mild dyspnea on exertion, which has improved significantly since her a-fib ablation in late July.  She denies palpitations but notes that her BP cuff indicated an irregular pulse yesterday.  Her blood pressure was elevated at 163/85 yesterday at home but is typically better.  She denies orthopnea, PND, and edema.  She has not had any bleeding, remaining on apixaban.  --------------------------------------------------------------------------------------------------  Past Medical History:  Diagnosis Date  . Atrial fibrillation ([facility])   . Depression   . Esophageal spasm   . Esophageal spasm   . GERD (gastroesophageal reflux disease)   . Heart murmur   . HOH (hard of hearing)   . Hypertension   . Pre-diabetes   . Stroke [facility])    Past Surgical History:  Procedure Laterality Date  . ATRIAL FIBRILLATION ABLATION N/A 06/13/2019   Procedure: ATRIAL FIBRILLATION ABLATION;  Surgeon: Regan Lemming, MD;  Location: [facility] INVASIVE CV LAB;  Service: Cardiovascular;  Laterality: N/A;  . CARDIAC CATHETERIZATION     . CARDIOVERSION N/A 04/01/2019   Procedure: CARDIOVERSION;  Surgeon: Lars Masson, MD;  Location: [facility] ENDOSCOPY;  Service: Cardiovascular;  Laterality: N/A;  . CATARACT EXTRACTION W/PHACO Left 09/09/2015   Procedure: CATARACT EXTRACTION PHACO AND INTRAOCULAR LENS PLACEMENT (I[facility]);  Surgeon: Lia Hopping, MD;  Location: [facility] ORS;  Service: Ophthalmology;  Laterality: Left;  [place]   1:10.5 AP     8.6 CDE  6.05 casette lot #  [id number]  . CATARACT EXTRACTION W/PHACO Right 09/30/2015   Procedure: CATARACT EXTRACTION PHACO AND INTRAOCULAR LENS PLACEMENT ([facility]);  Surgeon: Lia Hopping, MD;  Location: [facility] ORS;  Service: Ophthalmology;  Laterality: Right;  [place] 01:00.3 AP%: 11.6 CDE: 7.00 FLUID LOT# [id number]  . COLONOSCOPY    . COLONOSCOPY WITH PROPOFOL N/A 04/04/2017   Procedure: COLONOSCOPY WITH PROPOFOL;  Surgeon: Scot Jun, MD;  Location: [facility] ENDOSCOPY;  Service: Endoscopy;  Laterality: N/A;  . TONSILLECTOMY      Current Meds  Medication Sig  . acetaminophen (TYLENOL) 500 MG tablet Take 1,000 mg by mouth every 6 (six) hours as needed for moderate pain or headache.  Marland Kitchen amLODipine (NORVASC) 5 MG tablet Take 1 tablet (5 mg total) by mouth daily.  Marland Kitchen apixaban (ELIQUIS) 5 MG TABS tablet Take 1 tablet (5 mg total) by mouth 2 (two) times daily.  Marland Kitchen atorvastatin (LIPITOR) 20 MG tablet TAKE 1 TABLET (20 MG TOTAL) BY MOUTH DAILY AT 6 PM.  . Carboxymethylcellul-Glycerin (LUBRICATING EYE DROPS OP) Place 1 drop into both eyes daily as needed (dry eyes).  . citalopram (CELEXA) 20 MG tablet Take 20 mg by mouth daily.  . hydrochlorothiazide (HYDRODIURIL) 25 MG tablet Take 25 mg by mouth daily.  Marland Kitchen  loratadine (CLARITIN) 10 MG tablet Take 10 mg by mouth daily as needed for allergies.  Marland Kitchen losartan (COZAAR) 100 MG tablet Take 100 mg by mouth every evening.   . Multiple Vitamin (MULTIVITAMIN) tablet Take 1 tablet by mouth daily.  Marland Kitchen neomycin-bacitracin-polymyxin (NEOSPORIN) ointment Apply 1 application  topically as needed for wound care.  Marland Kitchen omeprazole (PRILOSEC) 20 MG capsule Take 20 mg by mouth daily.  Marland Kitchen tolterodine (DETROL LA) 2 MG 24 hr capsule Take 2 mg by mouth 2 (two) times daily.     Allergies: Sulfa antibiotics  Social History   Tobacco Use  . Smoking status: Never Smoker  . Smokeless tobacco: Never Used  Substance Use Topics  . Alcohol use: Yes    Comment: occasional  . Drug use: No    Family History  Problem Relation Age of Onset  . Breast cancer Maternal Aunt     Review of Systems: A 12-system review of systems was performed and was negative except as noted in the HPI.  --------------------------------------------------------------------------------------------------  Physical Exam: BP 130/78 (BP Location: Left Arm, Patient Position: Sitting, Cuff Size: Normal)   Pulse 70   Ht 5\' 2"  (1.575 m)   Wt 181 lb 4 oz (82.2 kg)   SpO2 99%   BMI 33.15 kg/m   General:  NAD HEENT: No conjunctival pallor or scleral icterus.  Neck: Supple without lymphadenopathy, thyromegaly, JVD, or HJR. Lungs: Normal work of breathing. Clear to auscultation bilaterally without wheezes or crackles. Heart: Regular rate and rhythm with occasional extrasystoles.  No murmurs, rubs, or gallops. Non-displaced PMI. Abd: Bowel sounds present. Soft, NT/ND without hepatosplenomegaly Ext: No lower extremity edema. Radial, PT, and DP pulses are 2+ bilaterally. Skin: Warm and dry without rash.  EKG:  NSR with PVC's.  Otherwise, no significant abnormality.  Lab Results  Component Value Date   WBC 4.7 06/13/2019   HGB 13.7 06/13/2019   HCT 40.9 06/13/2019   MCV 97.1 06/13/2019   PLT 181 06/13/2019    Lab Results  Component Value Date   NA 139 06/13/2019   K 3.4 (L) 06/13/2019   CL 103 06/13/2019   CO2 26 06/13/2019   BUN 18 06/13/2019   CREATININE 0.95 06/13/2019   GLUCOSE 105 (H) 06/13/2019   ALT 22 12/16/2018    Lab Results  Component Value Date   CHOL 164 12/17/2018   HDL  56 12/17/2018   LDLCALC 97 12/17/2018   TRIG 54 12/17/2018   CHOLHDL 2.9 12/17/2018    --------------------------------------------------------------------------------------------------  ASSESSMENT AND PLAN: Persistent atrial fibrillation: Ms. Spagnuolo is maintaining sinus rhythm following a-fib ablation in July.  We will continue apixaban and have her follow-up with Dr. Curt Bears in November, as planned.  Dyspnea on exertion: Improving following a-fib ablation.  No further chest pain since our last visit.  Myocardial perfusion stress test was low risk.  No further workup at this time.  PVC's These are the most likely cause for irregular heart beat noted on BP cuff.  We will check a BMP and Mg level today.  Given lack of symptoms, no further workup at this time.  Hypertension BP upper normal today.  No medication changes today.  Continue to work on lifestytle modifications, including exercise and weight loss.  Follow-up: Return to clinic in 6 months.  Nelva Bush, MD 08/13/2019 3:14 PM

## 2019-08-13 NOTE — Patient Instructions (Signed)
Medication Instructions:  Your physician recommends that you continue on your current medications as directed. Please refer to the Current Medication list given to you today.  If you need a refill on your cardiac medications before your next appointment, please call your pharmacy.   Lab work: Your physician recommends that you return for lab work in: TODAY - BMP, Mg.   If you have labs (blood work) drawn today and your tests are completely normal, you will receive your results only by: Marland Kitchen MyChart Message (if you have MyChart) OR . A paper copy in the mail If you have any lab test that is abnormal or we need to change your treatment, we will call you to review the results.  Testing/Procedures: NONE  Follow-Up: At [facility], you and your health needs are our priority.  As part of our continuing mission to provide you with exceptional heart care, we have created designated Provider Care Teams.  These Care Teams include your primary Cardiologist (physician) and Advanced Practice Providers (APPs -  Physician Assistants and Nurse Practitioners) who all work together to provide you with the care you need, when you need it. You will need a follow up appointment in 6 months.  Please call our office 2 months in advance to schedule this appointment.  You may see DR Harrell Gave END or one of the following Advanced Practice Providers on your designated Care Team:   Murray Hodgkins, NP Christell Faith, PA-C . Marrianne Mood, PA-C

## 2019-08-14 ENCOUNTER — Encounter: Payer: Self-pay | Admitting: Adult Health

## 2019-08-14 ENCOUNTER — Ambulatory Visit: Payer: Medicare Other | Admitting: Adult Health

## 2019-08-14 ENCOUNTER — Encounter: Payer: Self-pay | Admitting: Internal Medicine

## 2019-08-14 VITALS — BP 142/76 | HR 52 | Temp 97.3°F | Ht 62.0 in | Wt 183.6 lb

## 2019-08-14 DIAGNOSIS — I6300 Cerebral infarction due to thrombosis of unspecified precerebral artery: Secondary | ICD-10-CM | POA: Diagnosis not present

## 2019-08-14 DIAGNOSIS — I480 Paroxysmal atrial fibrillation: Secondary | ICD-10-CM

## 2019-08-14 DIAGNOSIS — E785 Hyperlipidemia, unspecified: Secondary | ICD-10-CM | POA: Diagnosis not present

## 2019-08-14 DIAGNOSIS — I10 Essential (primary) hypertension: Secondary | ICD-10-CM

## 2019-08-14 DIAGNOSIS — I493 Ventricular premature depolarization: Secondary | ICD-10-CM | POA: Insufficient documentation

## 2019-08-14 LAB — BASIC METABOLIC PANEL
BUN/Creatinine Ratio: 20 (ref 12–28)
BUN: 21 mg/dL (ref 8–27)
CO2: 24 mmol/L (ref 20–29)
Calcium: 9.9 mg/dL (ref 8.7–10.3)
Chloride: 101 mmol/L (ref 96–106)
Creatinine, Ser: 1.04 mg/dL — ABNORMAL HIGH (ref 0.57–1.00)
GFR calc Af Amer: 60 mL/min/{1.73_m2} (ref >59)
GFR calc non Af Amer: 52 mL/min/{1.73_m2} — ABNORMAL LOW (ref >59)
Glucose: 94 mg/dL (ref 65–99)
Potassium: 3.8 mmol/L (ref 3.5–5.2)
Sodium: 140 mmol/L (ref 134–144)

## 2019-08-14 LAB — MAGNESIUM: Magnesium: 1.9 mg/dL (ref 1.6–2.3)

## 2019-08-14 NOTE — Progress Notes (Signed)
I agree with the above plan 

## 2019-08-14 NOTE — Patient Instructions (Signed)
Continue Eliquis (apixaban) daily  and lipitor  for secondary stroke prevention  Continue to follow up with PCP regarding cholesteorl and blood pressure management   Continue to follow with cardiology for ongoing atrial fibrillation and eliquis management  Continue to monitor blood pressure at home  Maintain strict control of hypertension with blood pressure goal below 130/90, diabetes with hemoglobin A1c goal below 6.5% and cholesterol with LDL cholesterol (bad cholesterol) goal below 70 mg/dL. I also advised the patient to eat a healthy diet with plenty of whole grains, cereals, fruits and vegetables, exercise regularly and maintain ideal body weight.  Followup in the future with me in 6 months or call earlier if needed       Thank you for coming to see [place] at [facility]. I hope we have been able to provide you high quality care today.  You may receive a patient satisfaction survey over the next few weeks. We would appreciate your feedback and comments so that we may continue to improve ourselves and the health of our patients.

## 2019-08-14 NOTE — Progress Notes (Signed)
[facility] [address] [place]. [place] [address] [phone]       OFFICE FOLLOW UP NOTE  [name] Date of Birth:  1941-01-22 Medical Record Number:  [id number]   Reason for Referral:  hospital stroke follow up   CHIEF COMPLAINT:  Chief Complaint  Patient presents with  . Follow-up    CVA follow up room 9 pt alone pt had ablation after last appt     HPI:  08/14/19 visit Emma Rivera is being seen today for stroke follow-up.  EEG performed due to prior discussion about sensation as described below which was unremarkable for any abnormality.  On 06/13/2019.  Atrial fibrillation ablation performed due to persistent atrial fibrillation without complication.  She has been doing well since ablation stating "I feel like a new person".  She has not had any type of abnormal sensation previously discussed.  Stable from a neurological standpoint without reoccurring or new symptoms.  Continues on Eliquis and atorvastatin for secondary stroke prevention without myalgias.  Blood pressure today 142/76.  She does monitor at home and typically 130s/70s.  No further concerns at this time.  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 fib.  She continues to be stable from a stroke standpoint without residual deficits or reoccurring of symptoms.  Continues on Eliquis without bleeding or bruising.  She continues to follow with cardiology for atrial fibrillation and underwent cardioversion on 04/01/2019 which was successful.  Continues on atorvastatin without myalgias.  She does endorse "sweeping" sensation  Zip sensation prior to stroke but now more drawn out sensation Now feels as though its like a hand going over face - feels as though she may pass out - denies vision loss or any other neurological symptoms. Can happen randomly. Lasts  only a couple seconds. Lightheadedness. Denies palpitations, SOB or chest pain. Denies pain. Has occurred x2. Has never lost consciousness. Mother had same sensation who passed of a heart attack.  Dizziness - cardiology believes related to medications Blood pressure this AM 155/78, HR 40 Today at visit 178/78 HR 55 Cardiology has been adjusting medications as she was experiencing hypotension with SBP 80s She also endorses dyspnea sensation She alsmost feels better in Afib  No further concerns at this time.     Initial telemedicine visit 02/04/2019: Emma Rivera is a 78 y.o. female who was initially scheduled for face-to-face office visit today at this time but due to [facility] office visit rescheduled for non-face-to-face telephone visit.  She was recently evaluated at [facility] ED on 12/16/2018 after left frontal infarct s/p L M2 occlusion secondary to new diagnosis of A. Fib.  Eliquis initiated for secondary stroke prevention and A. fib management with recommendation of following with cardiology outpatient.  Since she has been home, she states she has been doing well with complete resolution of aphasic symptoms and denies any recurrence or new stroke/TIA symptoms.  She has continued on Eliquis without side effects of bleeding or bruising.  Continues on atorvastatin without side effects myalgias.  She does monitor BP at home which has been stable.  She continues to follow with cardiology for PAF and Eliquis management and has completed 14-day cardiac monitor to assess for PAF burden.  She does report daytime fatigue, easily to nap and snores.  No further concerns at this time.  Denies new or worsening stroke/TIA  symptoms.    ROS:   14 system review of systems performed and negative with exception of no complaints  PMH:  Past Medical History:  Diagnosis Date  . Atrial fibrillation (HCC)   . Depression   . Esophageal spasm   . Esophageal spasm   . GERD (gastroesophageal reflux disease)    . Heart murmur   . HOH (hard of hearing)   . Hypertension   . Pre-diabetes   . Stroke [facility](HCC)     PSH:  Past Surgical History:  Procedure Laterality Date  . ATRIAL FIBRILLATION ABLATION N/A 06/13/2019   Procedure: ATRIAL FIBRILLATION ABLATION;  Surgeon: Regan Lemmingamnitz, Will Martin, MD;  [facility] IN[facility] LAB;  Service: Cardiovascular;  Laterality: N/A;  . CARDIAC CATHETERIZATION    . CARDIOVERSION N/A 04/01/2019   Procedure: CARDIOVERSION;  Surgeon: Lars MassonNelson, Katarina H, MD;  [facility];  Service: Cardiovascular;  Laterality: N/A;  . CATARACT EXTRACTION W/PHACO Left 09/09/2015   Procedure: CATARACT EXTRACTION PHACO AND INTRAOCULAR LENS PLACEMENT (IOC);  Surgeon: Lia HoppingNisha Mukherjee, MD;  Location: [facility] ORS;  Service: Ophthalmology;  Laterality: Left;  US   1:10.5 AP     8.6 CDE  6.05 casette lot #  [id number]  . CATARACT EXTRACTION W/PHACO Right 09/30/2015   Procedure: CATARACT EXTRACTION PHACO AND INTRAOCULAR LENS PLACEMENT (IOC);  Surgeon: Lia HoppingNisha Mukherjee, MD;  Location: [facility] ORS;  Service: Ophthalmology;  Laterality: Right;  US 01:00.3 AP%: 11.6 CDE: 7.00 FLUID LOT# [id number]  . COLONOSCOPY    . COLONOSCOPY WITH PROPOFOL N/A 04/04/2017   Procedure: COLONOSCOPY WITH PROPOFOL;  Surgeon: Scot JunElliott, Robert T, MD;  [facility];  Service: Endoscopy;  Laterality: N/A;  . TONSILLECTOMY      Social History:  Social History   Socioeconomic History  . Marital status: Married    Spouse name: Not on file  . Number of children: Not on file  . Years of education: Not on file  . Highest education level: Not on file  Occupational History  . Not on file  Social Needs  . Financial resource strain: Not on file  . Food insecurity    Worry: Not on file    Inability: Not on file  . Transportation needs    Medical: Not on file    Non-medical: Not on file  Tobacco Use  . Smoking status: Never Smoker  . Smokeless tobacco: Never Used  Substance and Sexual Activity  . Alcohol  use: Yes    Comment: occasional  . Drug use: No  . Sexual activity: Not on file  Lifestyle  . Physical activity    Days per week: Not on file    Minutes per session: Not on file  . Stress: Not on file  Relationships  . Social Musicianconnections    Talks on phone: Not on file    Gets together: Not on file    Attends religious service: Not on file    Active member of club or organization: Not on file    Attends meetings of clubs or organizations: Not on file    Relationship status: Not on file  . Intimate partner violence    Fear of current or ex partner: Not on file    Emotionally abused: Not on file    Physically abused: Not on file    Forced sexual activity: Not on file  Other Topics Concern  . Not on file  Social History Narrative  . Not on file    Family History:  Family History  Problem Relation Age of Onset  . Breast cancer Maternal Aunt     Medications:   Current Outpatient Medications on File Prior to Visit  Medication Sig Dispense Refill  . acetaminophen (TYLENOL) 500 MG tablet Take 1,000 mg by mouth every 6 (six) hours as needed for moderate pain or headache.    Marland Kitchen amLODipine (NORVASC) 5 MG tablet Take 1 tablet (5 mg total) by mouth daily. 90 tablet 1  . apixaban (ELIQUIS) 5 MG TABS tablet Take 1 tablet (5 mg total) by mouth 2 (two) times daily. 60 tablet 0  . atorvastatin (LIPITOR) 20 MG tablet TAKE 1 TABLET (20 MG TOTAL) BY MOUTH DAILY AT 6 PM. 90 tablet 2  . Carboxymethylcellul-Glycerin (LUBRICATING EYE DROPS OP) Place 1 drop into both eyes daily as needed (dry eyes).    . citalopram (CELEXA) 20 MG tablet Take 20 mg by mouth daily.    . hydrochlorothiazide (HYDRODIURIL) 25 MG tablet Take 25 mg by mouth daily.    Marland Kitchen loratadine (CLARITIN) 10 MG tablet Take 10 mg by mouth daily as needed for allergies.    Marland Kitchen losartan (COZAAR) 100 MG tablet Take 100 mg by mouth every evening.     . Multiple Vitamin (MULTIVITAMIN) tablet Take 1 tablet by mouth daily.    Marland Kitchen  neomycin-bacitracin-polymyxin (NEOSPORIN) ointment Apply 1 application topically as needed for wound care.    Marland Kitchen omeprazole (PRILOSEC) 20 MG capsule Take 20 mg by mouth daily.    Marland Kitchen tolterodine (DETROL LA) 2 MG 24 hr capsule Take 2 mg by mouth 2 (two) times daily.      No current facility-administered medications on file prior to visit.     Allergies:   Allergies  Allergen Reactions  . Sulfa Antibiotics Nausea Only     Physical Exam  Vitals:   08/14/19 0925  BP: (!) 142/76  Pulse: (!) 52  Temp: (!) 97.3 F (36.3 C)  Weight: 183 lb 9.6 oz (83.3 kg)  Height:  (1.575 m)   Body mass index is 33.58 kg/m. No exam data present  Depression screen [facility] 2/9 01/01/2019  Decreased Interest 0  Down, Depressed, Hopeless 0  PHQ - 2 Score 0     General: well developed, well nourished,  pleasant elderly Caucasian female, seated, in no evident distress Head: head normocephalic and atraumatic.   Neck: supple with no carotid or supraclavicular bruits Cardiovascular: regular rate and rhythm, no murmurs Musculoskeletal: no deformity Skin:  no rash/petichiae Vascular:  Normal pulses all extremities   Neurologic Exam Mental Status: Awake and fully alert. Oriented to place and time. Recent and remote memory intact. Attention span, concentration and fund of knowledge appropriate. Mood and affect appropriate.  Cranial Nerves: Pupils equal, briskly reactive to light. Extraocular movements full without nystagmus. Visual fields full to confrontation. Hearing intact. Facial sensation intact. Face, tongue, palate moves normally and symmetrically.  Motor: Normal bulk and tone. Normal strength in all tested extremity muscles. Sensory.: intact to touch , pinprick , position and vibratory sensation.  Coordination: Rapid alternating movements normal in all extremities. Finger-to-nose and heel-to-shin performed accurately bilaterally. Gait and Station: Arises from chair without difficulty. Stance is  normal. Gait demonstrates normal stride length and balance Reflexes: 1+ and symmetric. Toes downgoing.        ASSESSMENT: Emma Rivera participated in telephone visit today with discussion in regards to recent left frontal infarct secondary to newly diagnosed A. fib. Denies residual deficits.  Recently underwent atrial fibrillation ablation with overall improvement feeling "  like a new person".   PLAN:   1. Left frontal infarct: Continue Eliquis (apixaban) daily  and Lipitor for secondary stroke prevention. Maintain strict control of hypertension with blood pressure goal below 130/90, diabetes with hemoglobin A1c goal below 6.5% and cholesterol with LDL cholesterol (bad cholesterol) goal below 70 mg/dL.  I also advised the patient to eat a healthy diet with plenty of whole grains, cereals, fruits and vegetables, exercise regularly with at least 30 minutes of continuous activity daily and maintain ideal body weight. 2. PAF: Continue Eliquis along with ongoing follow-up with cardiologist for monitoring and management 3. HTN: Advised to continue current treatment regimen.  Advised to continue to monitor at home along with continued follow-up with PCP for management 4. HLD: Advised to continue current treatment regimen along with continued follow-up with PCP for future prescribing and monitoring of lipid panel   Follow-up in 6 months or call earlier if needed -if continues to be stable, she can be discharged at that time   Greater than 50% of time during this 25 minute visit was spent on counseling, explanation of diagnosis of left frontal infarct, reviewing risk factor management of PAF, HTN and HLD, planning of further management along with potential future management, and discussion with patient and family answering all questions.    Ihor Austin, AGNP-BC  [facility] [address] [place], [place] [address]  Phone [phone] Fax [phone] Note:  This document was prepared with digital dictation and possible smart phrase technology. Any transcriptional errors that result from this process are unintentional.

## 2019-09-15 ENCOUNTER — Encounter: Payer: Self-pay | Admitting: Cardiology

## 2019-09-15 ENCOUNTER — Ambulatory Visit: Payer: Medicare Other | Admitting: Cardiology

## 2019-09-15 ENCOUNTER — Other Ambulatory Visit: Payer: Self-pay

## 2019-09-15 VITALS — BP 126/72 | HR 61 | Ht 62.0 in | Wt 178.8 lb

## 2019-09-15 DIAGNOSIS — I4819 Other persistent atrial fibrillation: Secondary | ICD-10-CM | POA: Diagnosis not present

## 2019-09-15 NOTE — Progress Notes (Addendum)
Electrophysiology Office Note   Date:  09/15/2019   ID:  Emma Rivera, DOB 1941-09-18, MRN [id number]  PCP:  [name], MD  Cardiologist:   Primary Electrophysiologist:  Fadi Menter Jorja Loa, MD    Chief Complaint: AF   History of Present Illness: Emma Rivera is a 78 y.o. female who is being seen today for the evaluation of AF at the request of [name], MD. Presenting today for electrophysiology evaluation.  Has a history of atrial fibrillation, hypertension, prediabetes, and CVA.  She is now status post AF ablation 06/13/2019.  Today, she denies symptoms of palpitations, chest pain, shortness of breath, orthopnea, PND, lower extremity edema, claudication, dizziness, presyncope, syncope, bleeding, or neurologic sequela. The patient is tolerating medications without difficulties.  She has noted no further episodes of atrial fibrillation.  She does state that when she walks up hills, she gets a pressure sensation in her chest.  This is a reproducible event and improves with rest.  I Emma Rivera discuss this with her primary cardiologist.  Otherwise she is feeling better than she has in many years.   Past Medical History:  Diagnosis Date  . Atrial fibrillation (HCC)   . Depression   . Esophageal spasm   . Esophageal spasm   . GERD (gastroesophageal reflux disease)   . Heart murmur   . HOH (hard of hearing)   . Hypertension   . Pre-diabetes   . Stroke [facility])    Past Surgical History:  Procedure Laterality Date  . ATRIAL FIBRILLATION ABLATION N/A 06/13/2019   Procedure: ATRIAL FIBRILLATION ABLATION;  Surgeon: Regan Lemming, MD;  [facility] INVASIVE CV LAB;  Service: Cardiovascular;  Laterality: N/A;  . CARDIAC CATHETERIZATION    . CARDIOVERSION N/A 04/01/2019   Procedure: CARDIOVERSION;  Surgeon: Lars Masson, MD;  Location: [facility] ENDOSCOPY;  Service: Cardiovascular;  Laterality: N/A;  . CATARACT EXTRACTION W/PHACO Left 09/09/2015   Procedure: CATARACT  EXTRACTION PHACO AND INTRAOCULAR LENS PLACEMENT (IOC);  Surgeon: Lia Hopping, MD;  Location: [facility] ORS;  Service: Ophthalmology;  Laterality: Left;  [place]   1:10.5 AP     8.6 CDE  6.05 casette lot #  [id number]  . CATARACT EXTRACTION W/PHACO Right 09/30/2015   Procedure: CATARACT EXTRACTION PHACO AND INTRAOCULAR LENS PLACEMENT (IOC);  Surgeon: Lia Hopping, MD;  Location: [facility] ORS;  Service: Ophthalmology;  Laterality: Right;  [place] 01:00.3 AP%: 11.6 CDE: 7.00 FLUID LOT# [id number]  . COLONOSCOPY    . COLONOSCOPY WITH PROPOFOL N/A 04/04/2017   Procedure: COLONOSCOPY WITH PROPOFOL;  Surgeon: Scot Jun, MD;  Location: [facility] ENDOSCOPY;  Service: Endoscopy;  Laterality: N/A;  . TONSILLECTOMY       Current Outpatient Medications  Medication Sig Dispense Refill  . acetaminophen (TYLENOL) 500 MG tablet Take 1,000 mg by mouth every 6 (six) hours as needed for moderate pain or headache.    Marland Kitchen amLODipine (NORVASC) 5 MG tablet Take 1 tablet (5 mg total) by mouth daily. 90 tablet 1  . apixaban (ELIQUIS) 5 MG TABS tablet Take 1 tablet (5 mg total) by mouth 2 (two) times daily. 60 tablet 0  . atorvastatin (LIPITOR) 20 MG tablet TAKE 1 TABLET (20 MG TOTAL) BY MOUTH DAILY AT 6 PM. 90 tablet 2  . Carboxymethylcellul-Glycerin (LUBRICATING EYE DROPS OP) Place 1 drop into both eyes daily as needed (dry eyes).    . citalopram (CELEXA) 20 MG tablet Take 20 mg by mouth daily.    . hydrochlorothiazide (HYDRODIURIL) 25 MG tablet Take  25 mg by mouth daily.    Marland Kitchen loratadine (CLARITIN) 10 MG tablet Take 10 mg by mouth daily as needed for allergies.    Marland Kitchen losartan (COZAAR) 100 MG tablet Take 100 mg by mouth every evening.     . Multiple Vitamin (MULTIVITAMIN) tablet Take 1 tablet by mouth daily.    Marland Kitchen neomycin-bacitracin-polymyxin (NEOSPORIN) ointment Apply 1 application topically as needed for wound care.    Marland Kitchen omeprazole (PRILOSEC) 20 MG capsule Take 20 mg by mouth daily.    Marland Kitchen tolterodine (DETROL LA) 2 MG 24 hr  capsule Take 2 mg by mouth 2 (two) times daily.      No current facility-administered medications for this visit.     Allergies:   Sulfa antibiotics   Social History:  The patient  reports that she has never smoked. She has never used smokeless tobacco. She reports current alcohol use. She reports that she does not use drugs.   Family History:  The patient's family history includes Breast cancer in her maternal aunt.    ROS:  Please see the history of present illness.   Otherwise, review of systems is positive for none.   All other systems are reviewed and negative.    PHYSICAL EXAM: VS:  BP 126/72   Pulse 61   Ht 5\' 2"  (1.575 m)   Wt 178 lb 12.8 oz (81.1 kg)   SpO2 99%   BMI 32.70 kg/m  , BMI Body mass index is 32.7 kg/m. GEN: Well nourished, well developed, in no acute distress  HEENT: normal  Neck: no JVD, carotid bruits, or masses Cardiac: RRR; no murmurs, rubs, or gallops,no edema  Respiratory:  clear to auscultation bilaterally, normal work of breathing GI: soft, nontender, nondistended, + BS MS: no deformity or atrophy  Skin: warm and dry Neuro:  Strength and sensation are intact Psych: euthymic mood, full affect  EKG:  EKG is ordered today. Personal review of the ekg ordered shows sinus rhythm, rate 61  Recent Labs: 12/16/2018: ALT 22 06/13/2019: Hemoglobin 13.7; Platelets 181 08/13/2019: BUN 21; Creatinine, Ser 1.04; Magnesium 1.9; Potassium 3.8; Sodium 140    Lipid Panel     Component Value Date/Time   CHOL 164 12/17/2018 0507   TRIG 54 12/17/2018 0507   HDL 56 12/17/2018 0507   CHOLHDL 2.9 12/17/2018 0507   VLDL 11 12/17/2018 0507   LDLCALC 97 12/17/2018 0507     Wt Readings from Last 3 Encounters:  09/15/19 178 lb 12.8 oz (81.1 kg)  08/14/19 183 lb 9.6 oz (83.3 kg)  08/13/19 181 lb 4 oz (82.2 kg)      Other studies Reviewed: Additional studies/ records that were reviewed today include: TTE 12/18/18  Review of the above records today demonstrates:    1. The left ventricle has normal systolic function of 83-38%. The cavity size is normal. There is no increased left ventricular wall thickness. Left ventricular diastology could not be evaluated secondary to atrial fibrillation.  2. The right ventricle has normal systolic function. The cavity in normal in size. There is no increase in right ventricular wall thickness.  3. Mildly dilated left atrial size.  4. The mitral valve is normal in structure There is mild thickening.  5. The aortic valve is tricuspid. There is mild thickening of the aortic valve.  6. Normal LV systolic function; mild TR.   ASSESSMENT AND PLAN:  1.  Persistent atrial fibrillation: Status post ablation 06/13/2019.  Remains in sinus rhythm.  Currently on Eliquis  5 mg.  Has remained in sinus rhythm since ablation.  No changes.  This patients CHA2DS2-VASc Score and unadjusted Ischemic Stroke Rate (% per year) is equal to 9.7 % stroke rate/year from a score of 6  Above score calculated as 1 point each if present [CHF, HTN, DM, Vascular=MI/PAD/Aortic Plaque, Age if 65-74, or Female] Above score calculated as 2 points each if present [Age > 75, or Stroke/TIA/TE]  2.  Hypertension: Currently well controlled  3.  Obesity: BMI 33.  Lifestyle modifications encouraged.  4.  Chest pressure: At this point is unclear to me as to the cause of her chest pressure.  She had a Myoview which was read as low risk and her CT preablation had a calcium score of 0.  Is less likely that this is coronary related.  Current medicines are reviewed at length with the patient today.   The patient does not have concerns regarding her medicines.  The following changes were made today:  none  Labs/ tests ordered today include:  Orders Placed This Encounter  Procedures  . EKG 12-Lead     Disposition:   FU with Treyce Spillers 3 months  Signed, Renessa Wellnitz Jorja LoaMartin Crysta Gulick, MD  09/15/2019 10:06 AM     [facility] [address]  [place] [id number] [phone] (office) [phone] (fax)

## 2019-12-23 ENCOUNTER — Encounter: Payer: Self-pay | Admitting: Cardiology

## 2019-12-23 ENCOUNTER — Other Ambulatory Visit: Payer: Self-pay

## 2019-12-23 ENCOUNTER — Ambulatory Visit: Payer: Medicare PPO | Admitting: Cardiology

## 2019-12-23 VITALS — BP 112/62 | HR 70 | Ht 62.0 in | Wt 184.8 lb

## 2019-12-23 DIAGNOSIS — I480 Paroxysmal atrial fibrillation: Secondary | ICD-10-CM

## 2019-12-23 DIAGNOSIS — I4819 Other persistent atrial fibrillation: Secondary | ICD-10-CM

## 2019-12-23 NOTE — Progress Notes (Signed)
Electrophysiology Office Note   Date:  12/23/2019   ID:  MARIADELOSANG WYNNS, DOB 1941/04/15, MRN [id number]  PCP:  [name], MD  Cardiologist:   Primary Electrophysiologist:  Orpha Dain Jorja Loa, MD    Chief Complaint: AF   History of Present Illness: NASHA DISS is a 79 y.o. female who is being seen today for the evaluation of AF at the request of [name], MD. Presenting today for electrophysiology evaluation.  Has a history of atrial fibrillation, hypertension, prediabetes, and CVA.  She is now status post AF ablation 06/13/2019.  Today, denies symptoms of palpitations, chest pain, shortness of breath, orthopnea, PND, lower extremity edema, claudication, dizziness, presyncope, syncope, bleeding, or neurologic sequela. The patient is tolerating medications without difficulties.  Overall she is doing well.  She has no chest pain shortness of breath.  She is able do all her daily activities.  She continues to have a burning when she exerts herself.  She had a coronary calcium score of 0 and a stress test without evidence of ischemia.  She is going to try taking her inhaler prior to exertion.   Past Medical History:  Diagnosis Date  . Atrial fibrillation (HCC)   . Depression   . Esophageal spasm   . Esophageal spasm   . GERD (gastroesophageal reflux disease)   . Heart murmur   . HOH (hard of hearing)   . Hypertension   . Pre-diabetes   . Stroke [facility])    Past Surgical History:  Procedure Laterality Date  . ATRIAL FIBRILLATION ABLATION N/A 06/13/2019   Procedure: ATRIAL FIBRILLATION ABLATION;  Surgeon: Regan Lemming, MD;  [facility]: [facility] INVASIVE CV LAB;  Service: Cardiovascular;  Laterality: N/A;  . CARDIAC CATHETERIZATION    . CARDIOVERSION N/A 04/01/2019   Procedure: CARDIOVERSION;  Surgeon: Lars Masson, MD;  Location: [facility] ENDOSCOPY;  Service: Cardiovascular;  Laterality: N/A;  . CATARACT EXTRACTION W/PHACO Left 09/09/2015   Procedure: CATARACT  EXTRACTION PHACO AND INTRAOCULAR LENS PLACEMENT (IOC);  Surgeon: Lia Hopping, MD;  Location: [facility] ORS;  Service: Ophthalmology;  Laterality: Left;  [place]   1:10.5 AP     8.6 CDE  6.05 casette lot #  [id number]  . CATARACT EXTRACTION W/PHACO Right 09/30/2015   Procedure: CATARACT EXTRACTION PHACO AND INTRAOCULAR LENS PLACEMENT (IOC);  Surgeon: Lia Hopping, MD;  Location: [facility] ORS;  Service: Ophthalmology;  Laterality: Right;  [place] 01:00.3 AP%: 11.6 CDE: 7.00 FLUID LOT# [id number]  . COLONOSCOPY    . COLONOSCOPY WITH PROPOFOL N/A 04/04/2017   Procedure: COLONOSCOPY WITH PROPOFOL;  Surgeon: Scot Jun, MD;  Location: [facility] ENDOSCOPY;  Service: Endoscopy;  Laterality: N/A;  . TONSILLECTOMY       Current Outpatient Medications  Medication Sig Dispense Refill  . acetaminophen (TYLENOL) 500 MG tablet Take 1,000 mg by mouth every 6 (six) hours as needed for moderate pain or headache.    Marland Kitchen amLODipine (NORVASC) 5 MG tablet Take 1 tablet (5 mg total) by mouth daily. 90 tablet 1  . apixaban (ELIQUIS) 5 MG TABS tablet Take 1 tablet (5 mg total) by mouth 2 (two) times daily. 60 tablet 0  . atorvastatin (LIPITOR) 20 MG tablet TAKE 1 TABLET (20 MG TOTAL) BY MOUTH DAILY AT 6 PM. 90 tablet 2  . Carboxymethylcellul-Glycerin (LUBRICATING EYE DROPS OP) Place 1 drop into both eyes daily as needed (dry eyes).    . citalopram (CELEXA) 20 MG tablet Take 20 mg by mouth daily.    . hydrochlorothiazide (HYDRODIURIL)  25 MG tablet Take 25 mg by mouth daily.    Marland Kitchen loratadine (CLARITIN) 10 MG tablet Take 10 mg by mouth daily as needed for allergies.    Marland Kitchen losartan (COZAAR) 100 MG tablet Take 100 mg by mouth every evening.     . Multiple Vitamin (MULTIVITAMIN) tablet Take 1 tablet by mouth daily.    Marland Kitchen omeprazole (PRILOSEC) 20 MG capsule Take 20 mg by mouth daily.    Marland Kitchen tolterodine (DETROL LA) 2 MG 24 hr capsule Take 2 mg by mouth 2 (two) times daily.      No current facility-administered medications for this visit.     Allergies:   Sulfa antibiotics   Social History:  The patient  reports that she has never smoked. She has never used smokeless tobacco. She reports current alcohol use. She reports that she does not use drugs.   Family History:  The patient's family history includes Breast cancer in her maternal aunt; Heart disease in her father and mother; Hypertension in her father and mother.    ROS:  Please see the history of present illness.   Otherwise, review of systems is positive for none.   All other systems are reviewed and negative.   PHYSICAL EXAM: VS:  BP 112/62   Pulse 70   Ht 5\' 2"  (1.575 m)   Wt 184 lb 12.8 oz (83.8 kg)   SpO2 97%   BMI 33.80 kg/m  , BMI Body mass index is 33.8 kg/m. GEN: Well nourished, well developed, in no acute distress  HEENT: normal  Neck: no JVD, carotid bruits, or masses Cardiac: RRR; no murmurs, rubs, or gallops,no edema  Respiratory:  clear to auscultation bilaterally, normal work of breathing GI: soft, nontender, nondistended, + BS MS: no deformity or atrophy  Skin: warm and dry Neuro:  Strength and sensation are intact Psych: euthymic mood, full affect  EKG:  EKG is ordered today. Personal review of the ekg ordered shows rhythm, rate 70  Recent Labs: 06/13/2019: Hemoglobin 13.7; Platelets 181 08/13/2019: BUN 21; Creatinine, Ser 1.04; Magnesium 1.9; Potassium 3.8; Sodium 140    Lipid Panel     Component Value Date/Time   CHOL 164 12/17/2018 0507   TRIG 54 12/17/2018 0507   HDL 56 12/17/2018 0507   CHOLHDL 2.9 12/17/2018 0507   VLDL 11 12/17/2018 0507   LDLCALC 97 12/17/2018 0507     Wt Readings from Last 3 Encounters:  12/23/19 184 lb 12.8 oz (83.8 kg)  09/15/19 178 lb 12.8 oz (81.1 kg)  08/14/19 183 lb 9.6 oz (83.3 kg)      Other studies Reviewed: Additional studies/ records that were reviewed today include: TTE 12/18/18  Review of the above records today demonstrates:   1. The left ventricle has normal systolic function of  34-19%. The cavity size is normal. There is no increased left ventricular wall thickness. Left ventricular diastology could not be evaluated secondary to atrial fibrillation.  2. The right ventricle has normal systolic function. The cavity in normal in size. There is no increase in right ventricular wall thickness.  3. Mildly dilated left atrial size.  4. The mitral valve is normal in structure There is mild thickening.  5. The aortic valve is tricuspid. There is mild thickening of the aortic valve.  6. Normal LV systolic function; mild TR.   ASSESSMENT AND PLAN:  1.  Persistent atrial fibrillation: Status post ablation 06/13/2019.  Currently on Eliquis.  CHA2DS2-VASc of 6.  She is fortunately had no  further episodes of atrial fibrillation.  She is tolerating her medications without issue.  2.  Hypertension: Currently well controlled  3.  Obesity: Lifestyle modification with diet and exercise encouraged.   Current medicines are reviewed at length with the patient today.   The patient does not have concerns regarding her medicines.  The following changes were made today: None  Labs/ tests ordered today include:  Orders Placed This Encounter  Procedures  . EKG 12-Lead     Disposition:   FU with Bhargav Barbaro 6 months  Signed, Zariya Minner Jorja Loa, MD  12/23/2019 10:39 AM     [facility] [address] [place] [address] [phone] (office) [phone] (fax)

## 2020-02-11 ENCOUNTER — Ambulatory Visit (INDEPENDENT_AMBULATORY_CARE_PROVIDER_SITE_OTHER): Payer: Medicare PPO | Admitting: Internal Medicine

## 2020-02-11 ENCOUNTER — Encounter: Payer: Self-pay | Admitting: Internal Medicine

## 2020-02-11 ENCOUNTER — Other Ambulatory Visit: Payer: Self-pay

## 2020-02-11 VITALS — BP 130/70 | HR 62 | Ht 62.0 in | Wt 183.4 lb

## 2020-02-11 DIAGNOSIS — R079 Chest pain, unspecified: Secondary | ICD-10-CM

## 2020-02-11 DIAGNOSIS — I10 Essential (primary) hypertension: Secondary | ICD-10-CM | POA: Diagnosis not present

## 2020-02-11 DIAGNOSIS — I4819 Other persistent atrial fibrillation: Secondary | ICD-10-CM

## 2020-02-11 NOTE — Progress Notes (Signed)
Follow-up Outpatient Visit Date: 02/11/2020  Primary Care Provider: Dion Body, MD [facility] [place] [address]  Chief Complaint: Follow-up atrial fibrillation and chest pain  HPI:  Emma Rivera is a 79 y.o. female with history of stroke (12/2018) and subsequent diagnosis of paroxysmal atrial fibrillation, hypertension, GERD, and esophageal spasm, who presents for follow-up of atrial fibrillation.  I last saw her in September, at which time she was doing well with resolution of chest discomfort that she reported at her prior visit in the summer.  She was seen last month by Dr. Curt Bears for follow-up after atrial fibrillation ablation.  She was still doing well at that time but noted some "burning" in her chest with exertion.  Today, Emma Rivera reports that she has been feeling well, denying chest pain, palpitations, lightheadedness, edema, and bleeding.  He notes minimal exertional dyspnea when exercising.  She is exercising regularly.  --------------------------------------------------------------------------------------------------  Past Medical History:  Diagnosis Date  . Atrial fibrillation ([place])   . Depression   . Esophageal spasm   . Esophageal spasm   . GERD (gastroesophageal reflux disease)   . Heart murmur   . HOH (hard of hearing)   . Hypertension   . Pre-diabetes   . Stroke [facility])    Past Surgical History:  Procedure Laterality Date  . ATRIAL FIBRILLATION ABLATION N/A 06/13/2019   Procedure: ATRIAL FIBRILLATION ABLATION;  Surgeon: Constance Haw, MD;  Location: [place] CV LAB;  Service: Cardiovascular;  Laterality: N/A;  . CARDIAC CATHETERIZATION    . CARDIOVERSION N/A 04/01/2019   Procedure: CARDIOVERSION;  Surgeon: Dorothy Spark, MD;  Location: [facility] ENDOSCOPY;  Service: Cardiovascular;  Laterality: N/A;  . CATARACT EXTRACTION W/PHACO Left 09/09/2015   Procedure: CATARACT EXTRACTION PHACO AND INTRAOCULAR LENS PLACEMENT  ([place]);  Surgeon: Lyla Glassing, MD;  Location: [facility] ORS;  Service: Ophthalmology;  Laterality: Left;  [place]   1:10.5 AP     8.6 CDE  6.05 casette lot #  [id number]  . CATARACT EXTRACTION W/PHACO Right 09/30/2015   Procedure: CATARACT EXTRACTION PHACO AND INTRAOCULAR LENS PLACEMENT (IOC);  Surgeon: Lyla Glassing, MD;  Location: [facility] ORS;  Service: Ophthalmology;  Laterality: Right;  [place] 01:00.3 AP%: 11.6 CDE: 7.00 FLUID LOT# [id number]  . COLONOSCOPY    . COLONOSCOPY WITH PROPOFOL N/A 04/04/2017   Procedure: COLONOSCOPY WITH PROPOFOL;  Surgeon: Manya Silvas, MD;  Location: [facility] ENDOSCOPY;  Service: Endoscopy;  Laterality: N/A;  . TONSILLECTOMY      Current Meds  Medication Sig  . acetaminophen (TYLENOL) 500 MG tablet Take 1,000 mg by mouth every 6 (six) hours as needed for moderate pain or headache.  . albuterol (VENTOLIN HFA) 108 (90 Base) MCG/ACT inhaler INHALE 2 PUFFS PRIOR TO EXERCISE  . amLODipine (NORVASC) 5 MG tablet Take 1 tablet (5 mg total) by mouth daily.  Marland Kitchen apixaban (ELIQUIS) 5 MG TABS tablet Take 1 tablet (5 mg total) by mouth 2 (two) times daily.  Marland Kitchen atorvastatin (LIPITOR) 20 MG tablet TAKE 1 TABLET (20 MG TOTAL) BY MOUTH DAILY AT 6 PM.  . Carboxymethylcellul-Glycerin (LUBRICATING EYE DROPS OP) Place 1 drop into both eyes daily as needed (dry eyes).  . citalopram (CELEXA) 20 MG tablet Take 20 mg by mouth daily.  . hydrochlorothiazide (HYDRODIURIL) 25 MG tablet Take 25 mg by mouth daily.  Marland Kitchen loratadine (CLARITIN) 10 MG tablet Take 10 mg by mouth daily as needed for allergies.  Marland Kitchen losartan (COZAAR) 100 MG tablet Take 100 mg  by mouth every evening.   . Multiple Vitamin (MULTIVITAMIN) tablet Take 1 tablet by mouth daily.  Marland Kitchen omeprazole (PRILOSEC) 20 MG capsule Take 20 mg by mouth daily.  Marland Kitchen tolterodine (DETROL LA) 2 MG 24 hr capsule Take 2 mg by mouth 2 (two) times daily.     Allergies: Sulfa antibiotics  Social History   Tobacco Use  . Smoking status: Never Smoker  .  Smokeless tobacco: Never Used  Substance Use Topics  . Alcohol use: Yes    Comment: occasional  . Drug use: No    Family History  Problem Relation Age of Onset  . Breast cancer Maternal Aunt   . Hypertension Mother   . Heart disease Mother   . Heart disease Father   . Hypertension Father     Review of Systems: A 12-system review of systems was performed and was negative except as noted in the HPI.  --------------------------------------------------------------------------------------------------  Physical Exam: BP 130/70 (BP Location: Left Arm, Patient Position: Sitting, Cuff Size: Normal)   Pulse 62   Ht 5\' 2"  (1.575 m)   Wt 183 lb 6 oz (83.2 kg)   SpO2 97%   BMI 33.54 kg/m   General:  NAD. Neck: No JVD or HJR. Lungs: CTA bilaterally. Heart: RRR w/o murmurs, rubs, or gallops. Abd: Soft, NT/ND. Ext: No LE edema.  2+ radial pules.  EKG:  NSR without abnormality.  Lab Results  Component Value Date   WBC 4.7 06/13/2019   HGB 13.7 06/13/2019   HCT 40.9 06/13/2019   MCV 97.1 06/13/2019   PLT 181 06/13/2019    Lab Results  Component Value Date   NA 140 08/13/2019   K 3.8 08/13/2019   CL 101 08/13/2019   CO2 24 08/13/2019   BUN 21 08/13/2019   CREATININE 1.04 (H) 08/13/2019   GLUCOSE 94 08/13/2019   ALT 22 12/16/2018    Lab Results  Component Value Date   CHOL 164 12/17/2018   HDL 56 12/17/2018   LDLCALC 97 12/17/2018   TRIG 54 12/17/2018   CHOLHDL 2.9 12/17/2018    --------------------------------------------------------------------------------------------------  ASSESSMENT AND PLAN: Persistent atrial fibrillation: Emma Rivera is maintaining sinus rhythm s/p ablation by Dr. Madilyn Fireman last summer.  We will continue anticoagulation, given CHADSVASC score of at least 6.  She should follow-up as planned with Dr. 09-08-1976.  Chest pain: No further symptoms reported.  Cardiac CTA prior to a-fib ablation showed no coronary artery calcification or significant  CAD involving the proximal coronary arteries.  MPI in September was low risk without ischemia or scar.  No further workup or intervention is planned at this time.  I encouraged Emma Rivera to continue exercising.  Hypertension: BP upper normal today.  No medication changes at this time.  Follow-up: Return to clinic in 1 year.  Madilyn Fireman, MD 02/11/2020 10:32 AM

## 2020-02-11 NOTE — Patient Instructions (Signed)
Medication Instructions:  Your physician recommends that you continue on your current medications as directed. Please refer to the Current Medication list given to you today.  *If you need a refill on your cardiac medications before your next appointment, please call your pharmacy*   Follow-Up: At [facility], you and your health needs are our priority.  As part of our continuing mission to provide you with exceptional heart care, we have created designated Provider Care Teams.  These Care Teams include your primary Cardiologist (physician) and Advanced Practice Providers (APPs -  Physician Assistants and Nurse Practitioners) who all work together to provide you with the care you need, when you need it.  We recommend signing up for the patient portal called "MyChart".  Sign up information is provided on this After Visit Summary.  MyChart is used to connect with patients for Virtual Visits (Telemedicine).  Patients are able to view lab/test results, encounter notes, upcoming appointments, etc.  Non-urgent messages can be sent to your provider as well.   To learn more about what you can do with MyChart, go to [email or web address].    Your next appointment:   12 month(s)  The format for your next appointment:   In Person  Provider:    You may see DR CHRISTOPHER END or one of the following Advanced Practice Providers on your designated Care Team:    Christopher Berge, NP  Ryan Dunn, PA-C  Jacquelyn Visser, PA-C   

## 2020-02-12 ENCOUNTER — Encounter: Payer: Self-pay | Admitting: Adult Health

## 2020-02-12 ENCOUNTER — Ambulatory Visit: Payer: Medicare Other | Admitting: Adult Health

## 2020-02-12 VITALS — BP 140/78 | HR 62 | Temp 97.5°F | Ht 62.0 in | Wt 184.4 lb

## 2020-02-12 DIAGNOSIS — I10 Essential (primary) hypertension: Secondary | ICD-10-CM

## 2020-02-12 DIAGNOSIS — Z8673 Personal history of transient ischemic attack (TIA), and cerebral infarction without residual deficits: Secondary | ICD-10-CM | POA: Diagnosis not present

## 2020-02-12 DIAGNOSIS — E785 Hyperlipidemia, unspecified: Secondary | ICD-10-CM | POA: Diagnosis not present

## 2020-02-12 DIAGNOSIS — I480 Paroxysmal atrial fibrillation: Secondary | ICD-10-CM

## 2020-02-12 NOTE — Patient Instructions (Signed)
Continue Eliquis (apixaban) daily  and atorvastatin for secondary stroke prevention  Continue to follow up with PCP regarding cholesterol and blood pressure management   Continue to monitor blood pressure at home  Maintain strict control of hypertension with blood pressure goal below 130/90, diabetes with hemoglobin A1c goal below 6.5% and cholesterol with LDL cholesterol (bad cholesterol) goal below 70 mg/dL. I also advised the patient to eat a healthy diet with plenty of whole grains, cereals, fruits and vegetables, exercise regularly and maintain ideal body weight.         Thank you for coming to see [place] at [facility]. I hope we have been able to provide you high quality care today.  You may receive a patient satisfaction survey over the next few weeks. We would appreciate your feedback and comments so that we may continue to improve ourselves and the health of our patients.

## 2020-02-12 NOTE — Progress Notes (Signed)
Emma Rivera my cardiologist yesterday; I'm doing really well"    HPI:   Emma Rivera is a 79 year old female who is being seen today, 02/12/2020, for stroke follow-up.  She has been stable from a stroke standpoint without residual or new/reoccurring stroke/TIA symptoms.  Continues on Eliquis for secondary stroke prevention and history of atrial fibrillation without any bleeding or bruising.  Continues on atorvastatin without myalgias.  Blood pressure today 140/78.  She feels as though everything has been stable and has no concerns at this time.    History: Provided for reference purposes only Update 08/14/2019 JM: Emma Rivera is being seen today for stroke follow-up.  EEG performed due to prior discussion about sensation as described below which was unremarkable for any abnormality.  On 06/13/2019.  Atrial fibrillation ablation performed due to persistent atrial fibrillation without complication.  She has been doing well since ablation stating "I feel like a new person".  She has not had any type of abnormal sensation previously discussed.  Stable from a neurological standpoint without reoccurring or new symptoms.  Continues on Eliquis and atorvastatin for secondary stroke prevention without myalgias.  Blood pressure today 142/76.  She does monitor at home and typically 130s/70s.  No further concerns at this time.  Update 05/15/2019 JM: 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 fib.  She continues to be stable from a stroke standpoint without  residual deficits or reoccurring of symptoms.  Continues on Eliquis without bleeding or bruising.  She continues to follow with cardiology for atrial fibrillation and underwent cardioversion on 04/01/2019 which was successful.  Continues on atorvastatin without myalgias.  She does endorse "sweeping" sensation  Zip sensation prior to stroke but now more drawn out sensation Now feels as though its like a hand going over face - feels as though she may pass out - denies vision loss or any other neurological symptoms. Can happen randomly. Lasts only a couple seconds. Lightheadedness. Denies palpitations, SOB or chest pain. Denies pain. Has occurred x2. Has never lost consciousness. Mother had same sensation who passed of a heart attack.  Dizziness - cardiology believes related to medications Blood pressure this AM 155/78, HR 40 Today at visit 178/78 HR 55 Cardiology has been adjusting medications as she was experiencing hypotension with SBP 80s She also endorses dyspnea sensation She alsmost feels better in Afib  No further concerns at this time.  Initial telemedicine visit 02/04/2019 JM: Emma Rivera is a 79 y.o. female who was initially scheduled for face-to-face office visit today at this time but due to [facility] office visit rescheduled for non-face-to-face telephone visit.  She was recently evaluated at [facility] ED on 12/16/2018 after left frontal infarct s/p L M2 occlusion secondary to new diagnosis of A. Fib.  Eliquis initiated for secondary stroke prevention and A. fib management with recommendation of following with cardiology outpatient.  Since she has been home, she states she has been doing well with complete resolution of aphasic symptoms and denies any recurrence or new  stroke/TIA symptoms.  She has continued on Eliquis without side effects of bleeding or bruising.  Continues on atorvastatin without side effects myalgias.  She does monitor BP at home which has been stable.  She continues to  follow with cardiology for PAF and Eliquis management and has completed 14-day cardiac monitor to assess for PAF burden.  She does report daytime fatigue, easily to nap and snores.  No further concerns at this time.  Denies new or worsening stroke/TIA symptoms.    ROS:   14 system review of systems performed and negative with exception of no complaints  PMH:  Past Medical History:  Diagnosis Date   Atrial fibrillation (HCC)    Depression    Esophageal spasm    Esophageal spasm    GERD (gastroesophageal reflux disease)    Heart murmur    HOH (hard of hearing)    Hypertension    Pre-diabetes    Stroke ([place])     PSH:  Past Surgical History:  Procedure Laterality Date   ATRIAL FIBRILLATION ABLATION N/A 06/13/2019   Procedure: ATRIAL FIBRILLATION ABLATION;  Surgeon: Constance Haw, MD;  Location: [place] CV LAB;  Service: Cardiovascular;  Laterality: N/A;   CARDIAC CATHETERIZATION     CARDIOVERSION N/A 04/01/2019   Procedure: CARDIOVERSION;  Surgeon: Dorothy Spark, MD;  Location: [facility] ENDOSCOPY;  Service: Cardiovascular;  Laterality: N/A;   CATARACT EXTRACTION W/PHACO Left 09/09/2015   Procedure: CATARACT EXTRACTION PHACO AND INTRAOCULAR LENS PLACEMENT ([place]);  Surgeon: Lyla Glassing, MD;  Location: [facility] ORS;  Service: Ophthalmology;  Laterality: Left;  [place]   1:10.5 AP     8.6 CDE  6.05 casette lot #  [id number]   CATARACT EXTRACTION W/PHACO Right 09/30/2015   Procedure: CATARACT EXTRACTION PHACO AND INTRAOCULAR LENS PLACEMENT (IOC);  Surgeon: Lyla Glassing, MD;  Location: [facility] ORS;  Service: Ophthalmology;  Laterality: Right;  [place] 01:00.3 AP%: 11.6 CDE: 7.00 FLUID LOT# [id number]   COLONOSCOPY     COLONOSCOPY WITH PROPOFOL N/A 04/04/2017   Procedure: COLONOSCOPY WITH PROPOFOL;  Surgeon: Manya Silvas, MD;  Location: [facility] ENDOSCOPY;  Service: Endoscopy;  Laterality: N/A;   TONSILLECTOMY      Social History:  Social History   Socioeconomic  History   Marital status: Married    Spouse name: Not on file   Number of children: Not on file   Years of education: Not on file   Highest education level: Not on file  Occupational History   Not on file  Tobacco Use   Smoking status: Never Smoker   Smokeless tobacco: Never Used  Substance and Sexual Activity   Alcohol use: Yes    Comment: occasional   Drug use: No   Sexual activity: Not on file  Other Topics Concern   Not on file  Social History Narrative   Not on file   Social Determinants of Health   Financial Resource Strain:    Difficulty of Paying Living Expenses:   Food Insecurity:    Worried About Charity fundraiser in the Last Year:    Arboriculturist in the Last Year:   Transportation Needs:    Film/video editor (Medical):    Lack of Transportation (Non-Medical):   Physical Activity:    Days of Exercise per Week:    Minutes of Exercise per Session:   Stress:    Feeling of Stress :   Social Connections:    Frequency of Communication with Friends and Family:  Frequency of Social Gatherings with Friends and Family:    Attends Religious Services:    Active Member of Clubs or Organizations:    Attends Engineer, structural:    Marital Status:   Intimate Partner Violence:    Fear of Current or Ex-Partner:    Emotionally Abused:    Physically Abused:    Sexually Abused:     Family History:  Family History  Problem Relation Age of Onset   Breast cancer Maternal Aunt    Hypertension Mother    Heart disease Mother    Heart disease Father    Hypertension Father     Medications:   Current Outpatient Medications on File Prior to Visit  Medication Sig Dispense Refill   acetaminophen (TYLENOL) 500 MG tablet Take 1,000 mg by mouth every 6 (six) hours as needed for moderate pain or headache.     albuterol (VENTOLIN HFA) 108 (90 Base) MCG/ACT inhaler INHALE 2 PUFFS PRIOR TO EXERCISE     amLODipine  (NORVASC) 5 MG tablet Take 1 tablet (5 mg total) by mouth daily. 90 tablet 1   apixaban (ELIQUIS) 5 MG TABS tablet Take 1 tablet (5 mg total) by mouth 2 (two) times daily. 60 tablet 0   atorvastatin (LIPITOR) 20 MG tablet TAKE 1 TABLET (20 MG TOTAL) BY MOUTH DAILY AT 6 PM. 90 tablet 2   Carboxymethylcellul-Glycerin (LUBRICATING EYE DROPS OP) Place 1 drop into both eyes daily as needed (dry eyes).     citalopram (CELEXA) 20 MG tablet Take 20 mg by mouth daily.     hydrochlorothiazide (HYDRODIURIL) 25 MG tablet Take 25 mg by mouth daily.     loratadine (CLARITIN) 10 MG tablet Take 10 mg by mouth daily as needed for allergies.     losartan (COZAAR) 100 MG tablet Take 100 mg by mouth every evening.      Multiple Vitamin (MULTIVITAMIN) tablet Take 1 tablet by mouth daily.     omeprazole (PRILOSEC) 20 MG capsule Take 20 mg by mouth daily.     tolterodine (DETROL LA) 2 MG 24 hr capsule Take 2 mg by mouth 2 (two) times daily.      No current facility-administered medications on file prior to visit.    Allergies:   Allergies  Allergen Reactions   Sulfa Antibiotics Nausea Only     Physical Exam  Vitals:   02/12/20 1023  BP: 140/78  Pulse: 62  Temp: (!) 97.5 F (36.4 C)  Weight: 184 lb 6.4 oz (83.6 kg)  Height: 5\' 2"  (1.575 m)   Body mass index is 33.73 kg/m. No exam data present  General: well developed, well nourished,  pleasant elderly Caucasian female, seated, in no evident distress Head: head normocephalic and atraumatic.   Neck: supple with no carotid or supraclavicular bruits Cardiovascular: regular rate and rhythm, no murmurs Musculoskeletal: no deformity Skin:  no rash/petichiae Vascular:  Normal pulses all extremities   Neurologic Exam Mental Status: Awake and fully alert.   Normal speech and language.  Oriented to place and time. Recent and remote memory intact. Attention span, concentration and fund of knowledge appropriate. Mood and affect appropriate.    Cranial Nerves: Pupils equal, briskly reactive to light. Extraocular movements full without nystagmus. Visual fields full to confrontation. Hearing intact. Facial sensation intact. Face, tongue, palate moves normally and symmetrically.  Motor: Normal bulk and tone. Normal strength in all tested extremity muscles. Sensory.: intact to touch , pinprick , position and vibratory sensation.  Coordination: Rapid  alternating movements normal in all extremities. Finger-to-nose and heel-to-shin performed accurately bilaterally. Gait and Station: Arises from chair without difficulty. Stance is normal. Gait demonstrates normal stride length and balance Reflexes: 1+ and symmetric. Toes downgoing.        ASSESSMENT: Ms. Glatfelter participated in telephone visit today with discussion in regards to recent left frontal infarct secondary to newly diagnosed A. fib. Denies residual deficits.  Previously underwent successful atrial fibrillation ablation and has been doing very well since that time.   PLAN:   1. Left frontal infarct: Continue Eliquis (apixaban) daily  and Lipitor for secondary stroke prevention.  Ongoing prescribing and management by PCP/cardiology maintain strict control of hypertension with blood pressure goal below 130/90, diabetes with hemoglobin A1c goal below 6.5% and cholesterol with LDL cholesterol (bad cholesterol) goal below 70 mg/dL.  I also advised the patient to eat a healthy diet with plenty of whole grains, cereals, fruits and vegetables, exercise regularly with at least 30 minutes of continuous activity daily and maintain ideal body weight. 2. PAF: Continue Eliquis along with ongoing follow-up with cardiologist for monitoring and management 3. HTN: Stable.  Ongoing monitoring management by PCP 4. HLD: Plans on having blood work next month with PCP.  Continuation of atorvastatin with ongoing prescribing management by PCP   Overall stable from stroke standpoint recommend follow-up as  needed with any stroke related concerns or questions   I spent 23 minutes of face-to-face and non-face-to-face time with patient.  This included previsit chart review, lab review, study review, order entry, electronic health record documentation, patient education regarding prior stroke and importance of ongoing stroke risk factor management and answered all questions to patient satisfaction     Ihor Austin, [facility]  [facility] [address] [place], [place] [address]  Phone [phone] Fax [phone] Note: This document was prepared with digital dictation and possible smart phrase technology. Any transcriptional errors that result from this process are unintentional.

## 2020-02-13 NOTE — Progress Notes (Signed)
I agree with the above plan 

## 2020-03-21 ENCOUNTER — Other Ambulatory Visit: Payer: Self-pay | Admitting: Internal Medicine

## 2020-03-22 ENCOUNTER — Other Ambulatory Visit: Payer: Self-pay | Admitting: Family Medicine

## 2020-03-22 DIAGNOSIS — Z1231 Encounter for screening mammogram for malignant neoplasm of breast: Secondary | ICD-10-CM

## 2020-03-25 ENCOUNTER — Ambulatory Visit
Admission: RE | Admit: 2020-03-25 | Discharge: 2020-03-25 | Disposition: A | Discharge status: Home / Self Care | Payer: Medicare PPO | Source: Ambulatory Visit | Attending: Family Medicine | Admitting: Family Medicine

## 2020-03-25 DIAGNOSIS — Z1231 Encounter for screening mammogram for malignant neoplasm of breast: Secondary | ICD-10-CM | POA: Insufficient documentation

## 2020-04-16 ENCOUNTER — Other Ambulatory Visit: Payer: Self-pay | Admitting: Internal Medicine

## 2020-07-06 ENCOUNTER — Encounter: Payer: Self-pay | Admitting: Cardiology

## 2020-07-06 ENCOUNTER — Ambulatory Visit: Payer: Medicare PPO | Admitting: Cardiology

## 2020-07-06 ENCOUNTER — Other Ambulatory Visit: Payer: Self-pay

## 2020-07-06 VITALS — BP 124/80 | HR 71 | Ht 62.0 in | Wt 183.6 lb

## 2020-07-06 DIAGNOSIS — I4819 Other persistent atrial fibrillation: Secondary | ICD-10-CM | POA: Diagnosis not present

## 2020-07-06 DIAGNOSIS — I480 Paroxysmal atrial fibrillation: Secondary | ICD-10-CM

## 2020-07-06 NOTE — Patient Instructions (Signed)
Medication Instructions:  Your physician recommends that you continue on your current medications as directed. Please refer to the Current Medication list given to you today.  *If you need a refill on your cardiac medications before your next appointment, please call your pharmacy*   Lab Work: None ordered If you have labs (blood work) drawn today and your tests are completely normal, you will receive your results only by: . MyChart Message (if you have MyChart) OR . A paper copy in the mail If you have any lab test that is abnormal or we need to change your treatment, we will call you to review the results.   Testing/Procedures: None ordered   Follow-Up: At [facility], you and your health needs are our priority.  As part of our continuing mission to provide you with exceptional heart care, we have created designated Provider Care Teams.  These Care Teams include your primary Cardiologist (physician) and Advanced Practice Providers (APPs -  Physician Assistants and Nurse Practitioners) who all work together to provide you with the care you need, when you need it.  We recommend signing up for the patient portal called "MyChart".  Sign up information is provided on this After Visit Summary.  MyChart is used to connect with patients for Virtual Visits (Telemedicine).  Patients are able to view lab/test results, encounter notes, upcoming appointments, etc.  Non-urgent messages can be sent to your provider as well.   To learn more about what you can do with MyChart, go to [email or web address].    Your next appointment:   6 month(s)  The format for your next appointment:   In Person  Provider:   Will Camnitz, MD   Thank you for choosing [facility]!!   Kataleia Quaranta, RN [phone]    Other Instructions    

## 2020-07-06 NOTE — Progress Notes (Signed)
Electrophysiology Office Note   Date:  07/06/2020   ID:  MIIA BLANKS, DOB [birth year], MRN [id number]  PCP:  [name], MD  Cardiologist:   Primary Electrophysiologist:  Mahaley Schwering Jorja Loa, MD    Chief Complaint: AF   History of Present Illness: Emma Rivera is a 79 y.o. female who is being seen today for the evaluation of AF at the request of [name], MD. Presenting today for electrophysiology evaluation.  She has a history significant for atrial fibrillation, hypertension, prediabetes, and CVA.  She is status post atrial fibrillation ablation 06/13/2019.     Today, denies symptoms of palpitations, chest pain, shortness of breath, orthopnea, PND, lower extremity edema, claudication, dizziness, presyncope, syncope, bleeding, or neurologic sequela. The patient is tolerating medications without difficulties.  She is overall feeling well.  She has no chest pain or shortness of breath.  She is able to do all of her daily activities.  She has had 3 episodes of atrial fibrillation is confirmed by her cardiac cath.  All episodes have lasted just a few minutes.  She is comfortable in her overall control and does not wish to change any medications.   Past Medical History:  Diagnosis Date  . Atrial fibrillation (HCC)   . Depression   . Esophageal spasm   . Esophageal spasm   . GERD (gastroesophageal reflux disease)   . Heart murmur   . HOH (hard of hearing)   . Hypertension   . Pre-diabetes   . Stroke [facility])    Past Surgical History:  Procedure Laterality Date  . ATRIAL FIBRILLATION ABLATION N/A 06/13/2019   Procedure: ATRIAL FIBRILLATION ABLATION;  Surgeon: Regan Lemming, MD;  Location: [facility] INVASIVE CV LAB;  Service: Cardiovascular;  Laterality: N/A;  . CARDIAC CATHETERIZATION    . CARDIOVERSION N/A 04/01/2019   Procedure: CARDIOVERSION;  Surgeon: Lars Masson, MD;  Location: [facility] ENDOSCOPY;  Service: Cardiovascular;  Laterality: N/A;  . CATARACT EXTRACTION  W/PHACO Left 09/09/2015   Procedure: CATARACT EXTRACTION PHACO AND INTRAOCULAR LENS PLACEMENT (IOC);  Surgeon: Lia Hopping, MD;  Location: [facility] ORS;  Service: Ophthalmology;  Laterality: Left;  [place]   1:10.5 AP     8.6 CDE  6.05 casette lot #  [id number]  . CATARACT EXTRACTION W/PHACO Right 09/30/2015   Procedure: CATARACT EXTRACTION PHACO AND INTRAOCULAR LENS PLACEMENT (I[facility]);  Surgeon: Lia Hopping, MD;  Location: [facility] ORS;  Service: Ophthalmology;  Laterality: Right;  [place] 01:00.3 AP%: 11.6 CDE: 7.00 FLUID LOT# [id number]  . COLONOSCOPY    . COLONOSCOPY WITH PROPOFOL N/A 04/04/2017   Procedure: COLONOSCOPY WITH PROPOFOL;  Surgeon: Scot Jun, MD;  Location: [facility] ENDOSCOPY;  Service: Endoscopy;  Laterality: N/A;  . TONSILLECTOMY       Current Outpatient Medications  Medication Sig Dispense Refill  . acetaminophen (TYLENOL) 500 MG tablet Take 1,000 mg by mouth every 6 (six) hours as needed for moderate pain or headache.    . albuterol (VENTOLIN HFA) 108 (90 Base) MCG/ACT inhaler INHALE 2 PUFFS PRIOR TO EXERCISE    . amLODipine (NORVASC) 5 MG tablet TAKE 1 TABLET BY MOUTH EVERY DAY 90 tablet 3  . apixaban (ELIQUIS) 5 MG TABS tablet Take 1 tablet (5 mg total) by mouth 2 (two) times daily. 60 tablet 0  . atorvastatin (LIPITOR) 20 MG tablet TAKE 1 TABLET (20 MG TOTAL) BY MOUTH DAILY AT 6 PM. 90 tablet 2  . Carboxymethylcellul-Glycerin (LUBRICATING EYE DROPS OP) Place 1 drop into both eyes daily as  needed (dry eyes).    . citalopram (CELEXA) 20 MG tablet Take 20 mg by mouth daily.    . hydrochlorothiazide (HYDRODIURIL) 25 MG tablet Take 25 mg by mouth daily.    Marland Kitchen loratadine (CLARITIN) 10 MG tablet Take 10 mg by mouth daily as needed for allergies.    Marland Kitchen losartan (COZAAR) 100 MG tablet Take 100 mg by mouth every evening.     . Multiple Vitamin (MULTIVITAMIN) tablet Take 1 tablet by mouth daily.    Marland Kitchen omeprazole (PRILOSEC) 20 MG capsule Take 20 mg by mouth daily.    Marland Kitchen tolterodine (DETROL  LA) 2 MG 24 hr capsule Take 2 mg by mouth 2 (two) times daily.      No current facility-administered medications for this visit.    Allergies:   Sulfa antibiotics   Social History:  The patient  reports that she has never smoked. She has never used smokeless tobacco. She reports current alcohol use. She reports that she does not use drugs.   Family History:  The patient's family history includes Breast cancer in her maternal aunt; Heart disease in her father and mother; Hypertension in her father and mother.    ROS:  Please see the history of present illness.   Otherwise, review of systems is positive for none.   All other systems are reviewed and negative.   PHYSICAL EXAM: VS:  BP 124/80   Pulse 71   Ht 5\' 2"  (1.575 m)   Wt 183 lb 9.6 oz (83.3 kg)   SpO2 94%   BMI 33.58 kg/m  , BMI Body mass index is 33.58 kg/m. GEN: Well nourished, well developed, in no acute distress  HEENT: normal  Neck: no JVD, carotid bruits, or masses Cardiac: RRR; no murmurs, rubs, or gallops,no edema  Respiratory:  clear to auscultation bilaterally, normal work of breathing GI: soft, nontender, nondistended, + BS MS: no deformity or atrophy  Skin: warm and dry Neuro:  Strength and sensation are intact Psych: euthymic mood, full affect  EKG:  EKG is ordered today. Personal review of the ekg ordered shows sinus rhythm  Recent Labs: 08/13/2019: BUN 21; Creatinine, Ser 1.04; Magnesium 1.9; Potassium 3.8; Sodium 140    Lipid Panel     Component Value Date/Time   CHOL 164 12/17/2018 0507   TRIG 54 12/17/2018 0507   HDL 56 12/17/2018 0507   CHOLHDL 2.9 12/17/2018 0507   VLDL 11 12/17/2018 0507   LDLCALC 97 12/17/2018 0507     Wt Readings from Last 3 Encounters:  07/06/20 183 lb 9.6 oz (83.3 kg)  02/12/20 184 lb 6.4 oz (83.6 kg)  02/11/20 183 lb 6 oz (83.2 kg)      Other studies Reviewed: Additional studies/ records that were reviewed today include: TTE 12/18/18  Review of the above records  today demonstrates:   1. The left ventricle has normal systolic function of 60-65%. The cavity size is normal. There is no increased left ventricular wall thickness. Left ventricular diastology could not be evaluated secondary to atrial fibrillation.  2. The right ventricle has normal systolic function. The cavity in normal in size. There is no increase in right ventricular wall thickness.  3. Mildly dilated left atrial size.  4. The mitral valve is normal in structure There is mild thickening.  5. The aortic valve is tricuspid. There is mild thickening of the aortic valve.  6. Normal LV systolic function; mild TR.   ASSESSMENT AND PLAN:  1.  Persistent atrial fibrillation:  Status post ablation 06/13/2019.  Currently on Eliquis with a CHA2DS2-VASc of 6.  She is in sinus rhythm today.  She has had 3 short episodes of atrial fibrillation since last being seen but overall is content with her control.  No changes.  2.  Hypertension: Currently well controlled  3.  Obesity: Lifestyle modification with diet and exercise encouraged.   Current medicines are reviewed at length with the patient today.   The patient does not have concerns regarding her medicines.  The following changes were made today: None  Labs/ tests ordered today include:  Orders Placed This Encounter  Procedures  . EKG 12-Lead     Disposition:   FU with Imagene Boss 6 months  Signed, Alexi Dorminey Jorja Loa, MD  07/06/2020 11:47 AM     [facility] [address] [place] [address] [phone] (office) [phone] (fax)

## 2021-01-04 ENCOUNTER — Ambulatory Visit: Payer: Medicare PPO | Admitting: Cardiology

## 2021-01-04 ENCOUNTER — Other Ambulatory Visit: Payer: Self-pay

## 2021-01-04 ENCOUNTER — Encounter: Payer: Self-pay | Admitting: Cardiology

## 2021-01-04 VITALS — BP 114/62 | HR 60 | Ht 62.0 in | Wt 188.6 lb

## 2021-01-04 DIAGNOSIS — I4819 Other persistent atrial fibrillation: Secondary | ICD-10-CM | POA: Diagnosis not present

## 2021-01-04 DIAGNOSIS — Z79899 Other long term (current) drug therapy: Secondary | ICD-10-CM | POA: Diagnosis not present

## 2021-01-04 LAB — BASIC METABOLIC PANEL
BUN/Creatinine Ratio: 15 (ref 12–28)
BUN: 16 mg/dL (ref 8–27)
CO2: 26 mmol/L (ref 20–29)
Calcium: 9.3 mg/dL (ref 8.7–10.3)
Chloride: 103 mmol/L (ref 96–106)
Creatinine, Ser: 1.07 mg/dL — ABNORMAL HIGH (ref 0.57–1.00)
GFR calc Af Amer: 57 mL/min/{1.73_m2} — ABNORMAL LOW (ref >59)
GFR calc non Af Amer: 50 mL/min/{1.73_m2} — ABNORMAL LOW (ref >59)
Glucose: 96 mg/dL (ref 65–99)
Potassium: 4.1 mmol/L (ref 3.5–5.2)
Sodium: 142 mmol/L (ref 134–144)

## 2021-01-04 LAB — CBC
Hematocrit: 39.3 % (ref 34.0–46.6)
Hemoglobin: 13.2 g/dL (ref 11.1–15.9)
MCH: 32.3 pg (ref 26.6–33.0)
MCHC: 33.6 g/dL (ref 31.5–35.7)
MCV: 96 fL (ref 79–97)
Platelets: 214 10*3/uL (ref 150–450)
RBC: 4.09 x10E6/uL (ref 3.77–5.28)
RDW: 13.2 % (ref 11.7–15.4)
WBC: 5.6 10*3/uL (ref 3.4–10.8)

## 2021-01-04 NOTE — Progress Notes (Signed)
Electrophysiology Office Note   Date:  01/04/2021   ID:  Emma Rivera, DOB 1941/09/26, MRN [id number]  PCP:  [name], MD  Cardiologist:   Primary Electrophysiologist:  Shalona Harbour Jorja Loa, MD    Chief Complaint: AF   History of Present Illness: Emma Rivera is a 80 y.o. female who is being seen today for the evaluation of AF at the request of [name], MD. Presenting today for electrophysiology evaluation.  She has a history significant for persistent atrial fibrillation, hypertension, prediabetes, and CVA.  She is status post atrial fibrillation ablation 06/13/2019.  Today, denies symptoms of palpitations, shortness of breath, orthopnea, PND, lower extremity edema, claudication, dizziness, presyncope, syncope, bleeding, or neurologic sequela. The patient is tolerating medications without difficulties.  Since last being seen, she has had minimal episodes of atrial fibrillation.  She does have intermittent palpitations, but these are regular and not fast.  I feel that she is just feeling her heart beating when she is exerting herself.  She does intermittently get exertional chest pain.  She says that she walks and after walking a certain distance, has repeatable chest discomfort.  This is improved with rest.  It is also associated with shortness of breath.  She has had a CT scan prior to her ablation with a coronary calcium score of 0 as well as a stress test that was read as a low risk.   Past Medical History:  Diagnosis Date  . Atrial fibrillation (HCC)   . Depression   . Esophageal spasm   . Esophageal spasm   . GERD (gastroesophageal reflux disease)   . Heart murmur   . HOH (hard of hearing)   . Hypertension   . Pre-diabetes   . Stroke [facility])    Past Surgical History:  Procedure Laterality Date  . ATRIAL FIBRILLATION ABLATION N/A 06/13/2019   Procedure: ATRIAL FIBRILLATION ABLATION;  Surgeon: Regan Lemming, MD;  [facility] LAB;  Service:  Cardiovascular;  Laterality: N/A;  . CARDIAC CATHETERIZATION    . CARDIOVERSION N/A 04/01/2019   Procedure: CARDIOVERSION;  Surgeon: Lars Masson, MD;  Location: [facility] ENDOSCOPY;  Service: Cardiovascular;  Laterality: N/A;  . CATARACT EXTRACTION W/PHACO Left 09/09/2015   Procedure: CATARACT EXTRACTION PHACO AND INTRAOCULAR LENS PLACEMENT (IOC);  Surgeon: Lia Hopping, MD;  Location: [facility] ORS;  Service: Ophthalmology;  Laterality: Left;  [place]   1:10.5 AP     8.6 CDE  6.05 casette lot #  [id number]  . CATARACT EXTRACTION W/PHACO Right 09/30/2015   Procedure: CATARACT EXTRACTION PHACO AND INTRAOCULAR LENS PLACEMENT (IOC);  Surgeon: Lia Hopping, MD;  Location: [facility] ORS;  Service: Ophthalmology;  Laterality: Right;  [place] 01:00.3 AP%: 11.6 CDE: 7.00 FLUID LOT# [id number]  . COLONOSCOPY    . COLONOSCOPY WITH PROPOFOL N/A 04/04/2017   Procedure: COLONOSCOPY WITH PROPOFOL;  Surgeon: Scot Jun, MD;  Location: [facility] ENDOSCOPY;  Service: Endoscopy;  Laterality: N/A;  . TONSILLECTOMY       Current Outpatient Medications  Medication Sig Dispense Refill  . acetaminophen (TYLENOL) 500 MG tablet Take 1,000 mg by mouth every 6 (six) hours as needed for moderate pain or headache.    . albuterol (VENTOLIN HFA) 108 (90 Base) MCG/ACT inhaler INHALE 2 PUFFS PRIOR TO EXERCISE    . amLODipine (NORVASC) 5 MG tablet TAKE 1 TABLET BY MOUTH EVERY DAY 90 tablet 3  . apixaban (ELIQUIS) 5 MG TABS tablet Take 1 tablet (5 mg total) by mouth 2 (two) times  daily. 60 tablet 0  . atorvastatin (LIPITOR) 20 MG tablet TAKE 1 TABLET (20 MG TOTAL) BY MOUTH DAILY AT 6 PM. 90 tablet 2  . Carboxymethylcellul-Glycerin (LUBRICATING EYE DROPS OP) Place 1 drop into both eyes daily as needed (dry eyes).    . citalopram (CELEXA) 20 MG tablet Take 20 mg by mouth daily.    . hydrochlorothiazide (HYDRODIURIL) 25 MG tablet Take 25 mg by mouth daily.    Marland Kitchen loratadine (CLARITIN) 10 MG tablet Take 10 mg by mouth daily as needed for  allergies.    Marland Kitchen losartan (COZAAR) 100 MG tablet Take 100 mg by mouth every evening.     . Multiple Vitamin (MULTIVITAMIN) tablet Take 1 tablet by mouth daily.    Marland Kitchen omeprazole (PRILOSEC) 20 MG capsule Take 20 mg by mouth daily.    Marland Kitchen tolterodine (DETROL LA) 2 MG 24 hr capsule Take 2 mg by mouth 2 (two) times daily.      No current facility-administered medications for this visit.    Allergies:   Sulfa antibiotics   Social History:  The patient  reports that she has never smoked. She has never used smokeless tobacco. She reports current alcohol use. She reports that she does not use drugs.   Family History:  The patient's family history includes Breast cancer in her maternal aunt; Heart disease in her father and mother; Hypertension in her father and mother.   ROS:  Please see the history of present illness.   Otherwise, review of systems is positive for none.   All other systems are reviewed and negative.   PHYSICAL EXAM: VS:  BP 114/62   Pulse 60   Ht 5\' 2"  (1.575 m)   Wt 188 lb 9.6 oz (85.5 kg)   SpO2 95%   BMI 34.50 kg/m  , BMI Body mass index is 34.5 kg/m. GEN: Well nourished, well developed, in no acute distress  HEENT: normal  Neck: no JVD, carotid bruits, or masses Cardiac: RRR; no murmurs, rubs, or gallops,no edema  Respiratory:  clear to auscultation bilaterally, normal work of breathing GI: soft, nontender, nondistended, + BS MS: no deformity or atrophy  Skin: warm and dry Neuro:  Strength and sensation are intact Psych: euthymic mood, full affect  EKG:  EKG is ordered today. Personal review of the ekg ordered shows sinus rhythm, rate 60, PVC  Recent Labs: No results found for requested labs within last 8760 hours.    Lipid Panel     Component Value Date/Time   CHOL 164 12/17/2018 0507   TRIG 54 12/17/2018 0507   HDL 56 12/17/2018 0507   CHOLHDL 2.9 12/17/2018 0507   VLDL 11 12/17/2018 0507   LDLCALC 97 12/17/2018 0507     Wt Readings from Last 3  Encounters:  01/04/21 188 lb 9.6 oz (85.5 kg)  07/06/20 183 lb 9.6 oz (83.3 kg)  02/12/20 184 lb 6.4 oz (83.6 kg)    For the Eliquis  Other studies Reviewed: Additional studies/ records that were reviewed today include: TTE 12/18/18  Review of the above records today demonstrates:   1. The left ventricle has normal systolic function of 60-65%. The cavity size is normal. There is no increased left ventricular wall thickness. Left ventricular diastology could not be evaluated secondary to atrial fibrillation.  2. The right ventricle has normal systolic function. The cavity in normal in size. There is no increase in right ventricular wall thickness.  3. Mildly dilated left atrial size.  4. The mitral  valve is normal in structure There is mild thickening.  5. The aortic valve is tricuspid. There is mild thickening of the aortic valve.  6. Normal LV systolic function; mild TR.   ASSESSMENT AND PLAN:  1.  Persistent atrial fibrillation: Status post ablation 06/13/2019.  Currently on Eliquis with a CHA2DS2-VASc of 6.  She has had minimal episodes of atrial fibrillation since her ablation.  She is currently comfortable with her overall control.  2.  Hypertension: Currently well controlled  3.  Obesity: Lifestyle modification with diet and exercise encouraged  4.  Chest pain: Patient has had a CT scan with a calcium score of 0 and a low risk tress test.  She is continued to have chest pain that is typical, worse when she exerts herself, improved with rest, and associated with shortness of breath.  With her low risk studies, I do not feel that she is at risk of coronary events, but Zarinah Oviatt discuss this with her primary cardiologist.  Current medicines are reviewed at length with the patient today.   The patient does not have concerns regarding her medicines.  The following changes were made today: None  Labs/ tests ordered today include:  Orders Placed This Encounter  Procedures  . Basic metabolic  panel  . CBC  . EKG 12-Lead     Disposition:   FU with Maylen Waltermire 6 months  Signed, Harjot Dibello Jorja Loa, MD  01/04/2021 10:36 AM     [facility] [address] [place] [address] [phone] (office) [phone] (fax)

## 2021-01-04 NOTE — Patient Instructions (Signed)
Medication Instructions:  Your physician recommends that you continue on your current medications as directed. Please refer to the Current Medication list given to you today.  *If you need a refill on your cardiac medications before your next appointment, please call your pharmacy*   Lab Work: Today: BMET & CBC If you have labs (blood work) drawn today and your tests are completely normal, you will receive your results only by: Marland Kitchen MyChart Message (if you have MyChart) OR . A paper copy in the mail If you have any lab test that is abnormal or we need to change your treatment, we will call you to review the results.   Testing/Procedures: None ordered   Follow-Up: At [facility], you and your health needs are our priority.  As part of our continuing mission to provide you with exceptional heart care, we have created designated Provider Care Teams.  These Care Teams include your primary Cardiologist (physician) and Advanced Practice Providers (APPs -  Physician Assistants and Nurse Practitioners) who all work together to provide you with the care you need, when you need it.  Your next appointment:   6 month(s)  The format for your next appointment:   In Person  Provider:   Loman Brooklyn, MD    Thank you for choosing [facility]!!   Dory Horn, RN [phone]

## 2021-01-06 NOTE — Progress Notes (Signed)
Attempted to schedule no ans no vm  

## 2021-01-25 NOTE — Progress Notes (Signed)
Attempted to schedule no ans no vm  

## 2021-02-09 ENCOUNTER — Other Ambulatory Visit: Payer: Self-pay | Admitting: Family Medicine

## 2021-02-09 DIAGNOSIS — Z1231 Encounter for screening mammogram for malignant neoplasm of breast: Secondary | ICD-10-CM

## 2021-02-28 NOTE — Progress Notes (Signed)
Follow-up Outpatient Visit Date: 03/02/2021  Primary Care Provider: [name], MD [phone] [facility] [address] [name] [place] [address]  Chief Complaint: Chest pain  HPI:  Ms. Stopher is a 80 y.o. female with history of stroke (12/2018) and subsequent diagnosis of paroxysmal atrial fibrillation, hypertension, GERD, and esophageal spasm, who presents for follow-up of atrial fibrillation.  I last saw her in 01/2020, at which time she was feeling well.  She was last seen by Dr. Elberta Fortis in 12/2019 for follow-up of her atrial fibrillation following catheter ablation.  She noted intermittent exertional chest pain improved with rest.  Pharmacologic MPI in 07/2019 was low risk without evidence of ischemia or scar.  Prior cardiac CTA in anticipation of atrial fibrillation ablation in 05/2019 showed no significant coronary artery calcification.  Today, Ms. Adduci reports that she has been feeling well.  On further questioning, however, she reports regular episodes of chest pain and pressure when she walks at more than a slow pace or goes up an incline.  This is accompanied by shortness of breath, with both the pain and dyspnea stopping quickly when she rests.  Symptoms have been stable for at least a year but are new since her myocardial perfusion stress test in 07/2019.  She denies symptoms at rest.  She notes occasional palpitations lasting a few seconds, which she describes as her heart beating harder.  She denies edema.  She remains compliant with her medications and has not experienced any bleeding.  Ms. Eckley is travelling to the [facility] in June and is concerned about not being able to keep up with her group on account of her exertional chest pain and shortness of breath.  --------------------------------------------------------------------------------------------------  Past Medical History:  Diagnosis Date  . Atrial fibrillation (HCC)   . Depression   . Esophageal spasm   .  Esophageal spasm   . GERD (gastroesophageal reflux disease)   . Heart murmur   . HOH (hard of hearing)   . Hypertension   . Pre-diabetes   . Stroke [facility])    Past Surgical History:  Procedure Laterality Date  . ATRIAL FIBRILLATION ABLATION N/A 06/13/2019   Procedure: ATRIAL FIBRILLATION ABLATION;  Surgeon: Regan Lemming, MD;  [facility] INVASIVE CV LAB;  Service: Cardiovascular;  Laterality: N/A;  . CARDIAC CATHETERIZATION    . CARDIOVERSION N/A 04/01/2019   Procedure: CARDIOVERSION;  Surgeon: Lars Masson, MD;  Location: [facility] ENDOSCOPY;  Service: Cardiovascular;  Laterality: N/A;  . CATARACT EXTRACTION W/PHACO Left 09/09/2015   Procedure: CATARACT EXTRACTION PHACO AND INTRAOCULAR LENS PLACEMENT (IOC);  Surgeon: Lia Hopping, MD;  Location: [facility] ORS;  Service: Ophthalmology;  Laterality: Left;  [place]   1:10.5 AP     8.6 CDE  6.05 casette lot #  [id number]  . CATARACT EXTRACTION W/PHACO Right 09/30/2015   Procedure: CATARACT EXTRACTION PHACO AND INTRAOCULAR LENS PLACEMENT (IOC);  Surgeon: Lia Hopping, MD;  Location: [facility] ORS;  Service: Ophthalmology;  Laterality: Right;  [place] 01:00.3 AP%: 11.6 CDE: 7.00 FLUID LOT# [id number]  . COLONOSCOPY    . COLONOSCOPY WITH PROPOFOL N/A 04/04/2017   Procedure: COLONOSCOPY WITH PROPOFOL;  Surgeon: Scot Jun, MD;  Location: [facility] ENDOSCOPY;  Service: Endoscopy;  Laterality: N/A;  . TONSILLECTOMY      Current Meds  Medication Sig  . acetaminophen (TYLENOL) 500 MG tablet Take 1,000 mg by mouth every 6 (six) hours as needed for moderate pain or headache.  . albuterol (VENTOLIN HFA) 108 (90 Base) MCG/ACT inhaler INHALE  2 PUFFS PRIOR TO EXERCISE  . amLODipine (NORVASC) 5 MG tablet TAKE 1 TABLET BY MOUTH EVERY DAY  . apixaban (ELIQUIS) 5 MG TABS tablet Take 1 tablet (5 mg total) by mouth 2 (two) times daily.  Marland Kitchen atorvastatin (LIPITOR) 20 MG tablet TAKE 1 TABLET (20 MG TOTAL) BY MOUTH DAILY AT 6 PM.  . Carboxymethylcellul-Glycerin  (LUBRICATING EYE DROPS OP) Place 1 drop into both eyes daily as needed (dry eyes).  . citalopram (CELEXA) 20 MG tablet Take 20 mg by mouth daily.  . hydrochlorothiazide (HYDRODIURIL) 25 MG tablet Take 25 mg by mouth daily.  Marland Kitchen loratadine (CLARITIN) 10 MG tablet Take 10 mg by mouth daily as needed for allergies.  Marland Kitchen losartan (COZAAR) 100 MG tablet Take 100 mg by mouth every evening.   . Multiple Vitamin (MULTIVITAMIN) tablet Take 1 tablet by mouth daily.  Marland Kitchen omeprazole (PRILOSEC) 20 MG capsule Take 20 mg by mouth daily.  Marland Kitchen tolterodine (DETROL LA) 2 MG 24 hr capsule Take 2 mg by mouth 2 (two) times daily.     Allergies: Sulfa antibiotics  Social History   Tobacco Use  . Smoking status: Never Smoker  . Smokeless tobacco: Never Used  Vaping Use  . Vaping Use: Every day  Substance Use Topics  . Alcohol use: Yes    Comment: occasional  . Drug use: No    Family History  Problem Relation Age of Onset  . Breast cancer Maternal Aunt   . Hypertension Mother   . Heart disease Mother   . Heart disease Father   . Hypertension Father     Review of Systems: A 12-system review of systems was performed and was negative except as noted in the HPI.  --------------------------------------------------------------------------------------------------  Physical Exam: BP 140/78 (BP Location: Left Arm, Patient Position: Sitting, Cuff Size: Large)   Pulse 66   Ht 5\' 2"  (1.575 m)   Wt 187 lb (84.8 kg)   SpO2 97%   BMI 34.20 kg/m   General:  NAD. Neck: No JVD or HJR. Lungs: Clear to auscultation bilaterally without wheezes or crackles. Heart: Regular rate and rhythm without murmurs, rubs, or gallops. Abdomen: Soft, nontender, nondistended. Extremities: No lower extremity edema.  EKG:  Normal sinus rhythm without abnormality.  Lab Results  Component Value Date   WBC 5.6 01/04/2021   HGB 13.2 01/04/2021   HCT 39.3 01/04/2021   MCV 96 01/04/2021   PLT 214 01/04/2021    Lab Results   Component Value Date   NA 142 01/04/2021   K 4.1 01/04/2021   CL 103 01/04/2021   CO2 26 01/04/2021   BUN 16 01/04/2021   CREATININE 1.07 (H) 01/04/2021   GLUCOSE 96 01/04/2021   ALT 22 12/16/2018    Lab Results  Component Value Date   CHOL 164 12/17/2018   HDL 56 12/17/2018   LDLCALC 97 12/17/2018   TRIG 54 12/17/2018   CHOLHDL 2.9 12/17/2018    --------------------------------------------------------------------------------------------------  ASSESSMENT AND PLAN: Stable angina: Exertional chest pain and shortness of breath have been stable for at least a year.  MPI in 07/2019 was low risk without ischemia or scar, though Ms. Diggins believes her current symptoms began after the stress test.  Prior cardiac CTA for a-fib ablation planning did not how any significant coronary artery calcification.  Given that symptoms occur with mild activity consistent with CCS class II-III angina, we have agreed to obtain a coronary CTA for further evaluation.  If no significant CAD is observed, addition of  ranolazine or long-acting nitrate will need to be considered.  For now, we will continue current dose of amlodipine.  I am reluctant to add a standing beta-blocker in the setting of low normal resting heart rate.  Persistent atrial fibrillation: No evidence of recurrence s/p ablation.  Continue anticoagulation with apixaban.  Hypertension: Blood pressure borderline elevated today but typically better at home and at other provider visits.  We will defer medication changes at this time.  Hyperlipidemia: Continue atorvastatin with ongoing follow-up per PCP (LDL 63 on last check through [facility] in 09/2020), given history of stroke.  Follow-up: Return to clinic in 1 month.  Yvonne Kendall, MD 03/02/2021 9:56 AM

## 2021-03-02 ENCOUNTER — Other Ambulatory Visit: Payer: Self-pay

## 2021-03-02 ENCOUNTER — Ambulatory Visit: Payer: Medicare PPO | Admitting: Internal Medicine

## 2021-03-02 ENCOUNTER — Encounter: Payer: Self-pay | Admitting: Internal Medicine

## 2021-03-02 VITALS — BP 140/78 | HR 66 | Ht 62.0 in | Wt 187.0 lb

## 2021-03-02 DIAGNOSIS — I10 Essential (primary) hypertension: Secondary | ICD-10-CM | POA: Diagnosis not present

## 2021-03-02 DIAGNOSIS — I4819 Other persistent atrial fibrillation: Secondary | ICD-10-CM

## 2021-03-02 DIAGNOSIS — I208 Other forms of angina pectoris: Secondary | ICD-10-CM | POA: Diagnosis not present

## 2021-03-02 DIAGNOSIS — E785 Hyperlipidemia, unspecified: Secondary | ICD-10-CM | POA: Diagnosis not present

## 2021-03-02 MED ORDER — METOPROLOL TARTRATE 50 MG PO TABS: ORAL_TABLET | ORAL | 0 refills | Status: DC

## 2021-03-02 NOTE — Patient Instructions (Signed)
Medication Instructions:  Your physician recommends that you continue on your current medications as directed. Please refer to the Current Medication list given to you today.  A one time dose of Metoprolol 50 mg to be taken 2 hours prior to your Cardiac CTA has been sent to your pharmacy.  *If you need a refill on your cardiac medications before your next appointment, please call your pharmacy*   Lab Work: Bmp today If you have labs (blood work) drawn today and your tests are completely normal, you will receive your results only by: Marland Kitchen MyChart Message (if you have MyChart) OR . A paper copy in the mail If you have any lab test that is abnormal or we need to change your treatment, we will call you to review the results.   Testing/Procedures: Your physician has requested that you have cardiac CT. Cardiac computed tomography (CT) is a painless test that uses an x-ray machine to take clear, detailed pictures of your heart. For further information please visit [email or web address] Please follow instruction sheet as given.      Follow-Up: At [facility], you and your health needs are our priority.  As part of our continuing mission to provide you with exceptional heart care, we have created designated Provider Care Teams.  These Care Teams include your primary Cardiologist (physician) and Advanced Practice Providers (APPs -  Physician Assistants and Nurse Practitioners) who all work together to provide you with the care you need, when you need it.  We recommend signing up for the patient portal called "MyChart".  Sign up information is provided on this After Visit Summary.  MyChart is used to connect with patients for Virtual Visits (Telemedicine).  Patients are able to view lab/test results, encounter notes, upcoming appointments, etc.  Non-urgent messages can be sent to your provider as well.   To learn more about what you can do with MyChart, go to [email or web address].    Your next  appointment:   4 week(s)  The format for your next appointment:   In Person  Provider:   You may see Yvonne Kendall, MD or one of the following Advanced Practice Providers on your designated Care Team:    Nicolasa Ducking, NP  Eula Listen, PA-C  [name], PA-C  Cadence Fransico Michael, [place]  Gillian Shields, NP    Other Instructions  Your cardiac CT will be scheduled at one of the below locations:   [facility] [address] [place], [place] [address] [phone]  OR  [facility] [address] [place], [place] [address] [phone]  If scheduled at [facility], please arrive at the [facility] main entrance (entrance A) of [facility] 30 minutes prior to test start time. Proceed to the [facility] Radiology Department (first floor) to check-in and test prep.  If scheduled at [facility], please arrive 15 mins early for check-in and test prep.  Please follow these instructions carefully (unless otherwise directed):    On the Night Before the Test: . Be sure to Drink plenty of water. . Do not consume any caffeinated/decaffeinated beverages or chocolate 12 hours prior to your test. . Do not take any antihistamines 12 hours prior to your test.  On the Day of the Test: . Drink plenty of water until 1 hour prior to the test. . Do not eat any food 4 hours prior to the test. . You may take your regular medications prior to the test.  .  Take metoprolol (Lopressor) two hours prior to test. . HOLD Hydrochlorothiazide morning of the test. . FEMALES- please wear underwire-free bra if available       After the Test: . Drink plenty of water. . After receiving IV contrast, you may experience a mild flushed feeling. This is normal. . On occasion, you may experience a mild rash up to 24 hours after the test. This is not dangerous. If this occurs, you can take Benadryl 25  mg and increase your fluid intake. . If you experience trouble breathing, this can be serious. If it is severe call 911 IMMEDIATELY. If it is mild, please call our office. . If you take any of these medications: Glipizide/Metformin, Avandament, Glucavance, please do not take 48 hours after completing test unless otherwise instructed.   Once we have confirmed authorization from your insurance company, we will call you to set up a date and time for your test. Based on how quickly your insurance processes prior authorizations requests, please allow up to 4 weeks to be contacted for scheduling your Cardiac CT appointment. Be advised that routine Cardiac CT appointments could be scheduled as many as 8 weeks after your provider has ordered it.  For non-scheduling related questions, please contact the cardiac imaging nurse navigator should you have any questions/concerns: Rockwell Alexandria, Cardiac Imaging Nurse Navigator Larey Brick, Cardiac Imaging Nurse Navigator [facility] Direct Office Dial: [phone]   For scheduling needs, including cancellations and rescheduling, please call [place], [phone].

## 2021-03-03 ENCOUNTER — Telehealth: Payer: Self-pay | Admitting: *Deleted

## 2021-03-03 LAB — BASIC METABOLIC PANEL
BUN/Creatinine Ratio: 13 (ref 12–28)
BUN: 13 mg/dL (ref 8–27)
CO2: 28 mmol/L (ref 20–29)
Calcium: 9.7 mg/dL (ref 8.7–10.3)
Chloride: 104 mmol/L (ref 96–106)
Creatinine, Ser: 0.98 mg/dL (ref 0.57–1.00)
Glucose: 104 mg/dL — ABNORMAL HIGH (ref 65–99)
Potassium: 4 mmol/L (ref 3.5–5.2)
Sodium: 146 mmol/L — ABNORMAL HIGH (ref 134–144)
eGFR: 59 mL/min/{1.73_m2} — ABNORMAL LOW (ref >59)

## 2021-03-03 NOTE — Telephone Encounter (Signed)
-----   Message from Yvonne Kendall, MD sent at 03/03/2021  6:52 AM EDT ----- Labs are stable.  Ok to proceed with coronary CTA as planned.

## 2021-03-03 NOTE — Telephone Encounter (Signed)
Spoke with patient and she had no further questions at this time.

## 2021-03-03 NOTE — Telephone Encounter (Signed)
No answer/Voicemail box is full.  

## 2021-03-16 ENCOUNTER — Telehealth (HOSPITAL_COMMUNITY): Payer: Self-pay | Admitting: *Deleted

## 2021-03-16 NOTE — Telephone Encounter (Signed)
Reaching out to patient to offer assistance regarding upcoming cardiac imaging study; pt verbalizes understanding of appt date/time, parking situation and where to check in, pre-test NPO status and medications ordered, and verified current allergies; name and call back number provided for further questions should they arise  Emma Thornton RN Navigator Cardiac Imaging [facility] [phone] office [phone] cell  Pt to take 50mg metoprolol tartrate 2 hours prior to cardiac CT scan. 

## 2021-03-17 ENCOUNTER — Ambulatory Visit
Admission: RE | Admit: 2021-03-17 | Discharge: 2021-03-17 | Disposition: A | Discharge status: Home / Self Care | Payer: Medicare PPO | Source: Ambulatory Visit | Attending: Internal Medicine | Admitting: Internal Medicine

## 2021-03-17 ENCOUNTER — Other Ambulatory Visit: Payer: Self-pay

## 2021-03-17 DIAGNOSIS — I208 Other forms of angina pectoris: Secondary | ICD-10-CM | POA: Insufficient documentation

## 2021-03-17 MED ORDER — IOHEXOL 350 MG/ML SOLN
100.0000 mL | Freq: Once | INTRAVENOUS | Status: AC | PRN
  Administered 2021-03-17: 100 mL via INTRAVENOUS

## 2021-03-17 MED ORDER — NITROGLYCERIN 0.4 MG SL SUBL
0.8000 mg | SUBLINGUAL_TABLET | Freq: Once | SUBLINGUAL | Status: AC
  Administered 2021-03-17: 0.8 mg via SUBLINGUAL

## 2021-03-17 NOTE — Progress Notes (Signed)
Patient tolerated CT well. Drank water and soda as well as ate peanut butter crackers after Vital signs stable encourage to drink water throughout day.Reasons explained and verbalized understanding. Ambulated steady gait.

## 2021-03-24 ENCOUNTER — Ambulatory Visit: Admission: RE | Admit: 2021-03-24 | Payer: Medicare PPO | Source: Ambulatory Visit

## 2021-03-28 ENCOUNTER — Ambulatory Visit
Admission: RE | Admit: 2021-03-28 | Discharge: 2021-03-28 | Disposition: A | Discharge status: Home / Self Care | Payer: Medicare PPO | Source: Ambulatory Visit | Attending: Family Medicine | Admitting: Family Medicine

## 2021-03-28 ENCOUNTER — Other Ambulatory Visit: Payer: Self-pay

## 2021-03-28 DIAGNOSIS — Z1231 Encounter for screening mammogram for malignant neoplasm of breast: Secondary | ICD-10-CM | POA: Insufficient documentation

## 2021-03-29 NOTE — Progress Notes (Signed)
Office Visit    Patient Name: MARGOT ORIORDAN Date of Encounter: 03/30/2021  PCP:  [name], MD   [facility]  Cardiologist:  Yvonne Kendall, MD  Advanced Practice Provider:  No care team member to display Electrophysiologist:  Will Jorja Loa, MD :[id number]}   Chief Complaint    Chief Complaint  Patient presents with  . Follow-up    4 Weeks follow up. Medications verbally reviewed with patient.     80 y.o. female with history of stroke (12/2018) and subsequent diagnosis of paroxysmal atrial fibrillation, hypertension, GERD, esophageal spasm, and who presents today for 1 month follow-up.  Past Medical History    Past Medical History:  Diagnosis Date  . Atrial fibrillation (HCC)   . Depression   . Esophageal spasm   . Esophageal spasm   . GERD (gastroesophageal reflux disease)   . Heart murmur   . HOH (hard of hearing)   . Hypertension   . Pre-diabetes   . Stroke [facility])    Past Surgical History:  Procedure Laterality Date  . ATRIAL FIBRILLATION ABLATION N/A 06/13/2019   Procedure: ATRIAL FIBRILLATION ABLATION;  Surgeon: Regan Lemming, MD;  Location: [facility] INVASIVE CV LAB;  Service: Cardiovascular;  Laterality: N/A;  . CARDIAC CATHETERIZATION    . CARDIOVERSION N/A 04/01/2019   Procedure: CARDIOVERSION;  Surgeon: Lars Masson, MD;  Location: [facility] ENDOSCOPY;  Service: Cardiovascular;  Laterality: N/A;  . CATARACT EXTRACTION W/PHACO Left 09/09/2015   Procedure: CATARACT EXTRACTION PHACO AND INTRAOCULAR LENS PLACEMENT (IOC);  Surgeon: Lia Hopping, MD;  Location: [facility] ORS;  Service: Ophthalmology;  Laterality: Left;  [place]   1:10.5 AP     8.6 CDE  6.05 casette lot #  [id number]  . CATARACT EXTRACTION W/PHACO Right 09/30/2015   Procedure: CATARACT EXTRACTION PHACO AND INTRAOCULAR LENS PLACEMENT (IOC);  Surgeon: Lia Hopping, MD;  Location: [facility] ORS;  Service: Ophthalmology;  Laterality: Right;  [place] 01:00.3 AP%: 11.6 CDE:  7.00 FLUID LOT# [id number]  . COLONOSCOPY    . COLONOSCOPY WITH PROPOFOL N/A 04/04/2017   Procedure: COLONOSCOPY WITH PROPOFOL;  Surgeon: Scot Jun, MD;  Location: [facility] ENDOSCOPY;  Service: Endoscopy;  Laterality: N/A;  . TONSILLECTOMY      Allergies  Allergies  Allergen Reactions  . Sulfa Antibiotics Nausea Only    History of Present Illness    JAYELYN BARNO is a 80 y.o. female with PMH as above.  She has history of stroke (12/2018) with subsequent diagnosis of paroxysmal atrial fibrillation, hypertension, GERD, and esophageal spasm.    She was seen by Dr. Elberta Fortis 12/2019 to follow-up on her atrial fibrillation s/p catheter ablation.  She noted intermittent exertional chest pain, improved with rest.  MPI 07/2019 low risk without evidence of ischemia or scar.  Prior cCTA in anticipation of ablation 05/2019 without significant CAD.    She was last seen in office 03/02/2021.  She reported regular episodes of CP and pressure when walking more than a slow pace or going up an incline.  CP was associated with SOB with both CP and dyspnea stopping quickly when she rested.  Symptoms were reportedly stable for at least a year but new since her MPI 07/2019.  No symptoms at rest.  She reported occasional palpitations, lasting a few seconds, and described as her heart beating harder.    She was going to travel to the [place] in June, and she was concerned about not being able to keep up with  her group on account of exertional chest pain and shortness of breath.    Given her exertional symptoms, recommendation was to obtain coronary CTA for further evaluation.  If no significant CAD observed, addition of ranolazine or long-acting nitrate recommended.  She was continued on amlodipine.  It was noted that beta-blocker was not added at that time in the setting of low normal resting heart rate.  She was continued on apixaban for anticoagulation.  It was noted that her BP was elevated/borderline but  typically better at home.  Medication changes were deferred in that setting.  She was continued on atorvastatin with ongoing follow-up per PCP.   She subsequently obtained a coronary CTA that showed a normal coronary calcium score of 0.  No evidence of CAD was noted.  Today, 03/30/2021, she presents for follow-up and notes ongoing exertional symptoms as above.  She continues to experience chest pain and shortness of breath with walking, resolving within minutes.  In addition, she reports a cold or allergies with corresponding cough and nasal congestion x1-2 week.  With this cold, she has noted some pressure in her chest at rest, and she wonders if her cold is contributing to this discomfort.  She also reports some MSK pain after prolonged episodes of coughing.  She reports wheezing has started recently that gets worse/keeps her up when attempting to sleep.  She is coughing up yellow to green phlegm.  She notices the phlegm is worse in the morning.   Recently, she has noticed blood tinged sputum.  She has tried albuterol and Mucinex without relief.  She denies any orthopnea or PND.  No weight gain or early satiety.  She does note significant fatigue and lack of motivation. Weight today actually down from her previous clinic weight of 187 pounds and currently 184 pounds.  BP improved from previous clinic weight.  She is monitoring her BP at home with typical readings 130/70-80.  Lowest SBP 120 and highest 140. She reports medication compliance.  She notes unchanged tachypalpitations on amlodipine.  Coronary CTA reviewed and Imdur discussed, given her BP today at 130/70.  Heart rate noted to be elevated today with beta-blocker deferred, given this could be related to her current illness.  She denies any presyncope or syncope.  Other than her blood-tinged sputum, no signs of bleeding.  She reports she recently saw her PCP with lungs clear at that time (03/23/2021).  She reports that she does feel her cold has been  ongoing for some time.  Home Medications   Current Outpatient Medications  Medication Instructions  . acetaminophen (TYLENOL) 1,000 mg, Oral, Every 6 hours PRN  . albuterol (VENTOLIN HFA) 108 (90 Base) MCG/ACT inhaler INHALE 2 PUFFS PRIOR TO EXERCISE  . amLODipine (NORVASC) 5 MG tablet TAKE 1 TABLET BY MOUTH EVERY DAY  . apixaban (ELIQUIS) 5 mg, Oral, 2 times daily  . atorvastatin (LIPITOR) 20 mg, Oral, Daily-1800  . Carboxymethylcellul-Glycerin (LUBRICATING EYE DROPS OP) 1 drop, Both Eyes, Daily PRN  . citalopram (CELEXA) 20 mg, Oral, Daily  . hydrochlorothiazide (HYDRODIURIL) 25 mg, Oral, Daily  . loratadine (CLARITIN) 10 mg, Oral, Daily PRN  . losartan (COZAAR) 100 mg, Oral, Every evening  . Multiple Vitamin (MULTIVITAMIN) tablet 1 tablet, Oral, Daily  . omeprazole (PRILOSEC) 20 mg, Oral, Daily  . tolterodine (DETROL LA) 2 mg, Oral, 2 times daily     Review of Systems    She reports ongoing exertional symptoms with chest pain and shortness of breath with walking, resolving  within minutes.  She reports cold and allergies with cough and nasal congestion, as well as phlegm that is green and yellow in color.  She reports coughing up green and yellow phlegm, sometimes for a long period of time with associated MSK pain in her chest after that time.  Recently, she has noted blood-tinged sputum and MSK type pain with prolonged coughing episodes.  She notes significant fatigue and lack of motivation.  She has unchanged tachypalpitations.  All other systems reviewed and are otherwise negative except as noted above.  Physical Exam    VS:  BP 130/70 (BP Location: Left Arm, Patient Position: Sitting, Cuff Size: Normal)   Pulse 78   Ht 5\' 2"  (1.575 m)   Wt 184 lb (83.5 kg)   SpO2 96%   BMI 33.65 kg/m  , BMI Body mass index is 33.65 kg/m. GEN: Well nourished, well developed, in no acute distress.  Facemask in place. HEENT: normal. Neck: Supple, no JVD, carotid bruits, or masses. Cardiac:  RRR, no murmurs, rubs, or gallops. No clubbing, cyanosis, edema.  Radials/DP/PT 2+ and equal bilaterally.  Respiratory:  Bilateral wheezing appreciated throughout the lung fields. GI: Soft, nontender, nondistended, BS + x 4. MS: no deformity or atrophy. Skin: warm and dry, no rash. Neuro:  Strength and sensation are intact. Psych: Normal affect.  Accessory Clinical Findings    ECG personally reviewed by me today - No EKG- no acute changes.  VITALS Reviewed today   Temp Readings from Last 3 Encounters:  02/12/20 (!) 97.5 F (36.4 C)  08/14/19 (!) 97.3 F (36.3 C)  06/13/19 98 F (36.7 C) (Oral)   BP Readings from Last 3 Encounters:  03/30/21 130/70  03/17/21 136/67  03/02/21 140/78   Pulse Readings from Last 3 Encounters:  03/30/21 78  03/17/21 (!) 56  03/02/21 66    Wt Readings from Last 3 Encounters:  03/30/21 184 lb (83.5 kg)  03/02/21 187 lb (84.8 kg)  01/04/21 188 lb 9.6 oz (85.5 kg)     LABS  reviewed today   Lab Results  Component Value Date   WBC 5.6 01/04/2021   HGB 13.2 01/04/2021   HCT 39.3 01/04/2021   MCV 96 01/04/2021   PLT 214 01/04/2021   Lab Results  Component Value Date   CREATININE 0.98 03/02/2021   BUN 13 03/02/2021   NA 146 (H) 03/02/2021   K 4.0 03/02/2021   CL 104 03/02/2021   CO2 28 03/02/2021   Lab Results  Component Value Date   ALT 22 12/16/2018   AST 26 12/16/2018   ALKPHOS 49 12/16/2018   BILITOT 1.3 (H) 12/16/2018   Lab Results  Component Value Date   CHOL 164 12/17/2018   HDL 56 12/17/2018   LDLCALC 97 12/17/2018   TRIG 54 12/17/2018   CHOLHDL 2.9 12/17/2018    Lab Results  Component Value Date   HGBA1C 5.7 (H) 12/17/2018   No results found for: TSH   STUDIES/PROCEDURES reviewed today   cCTA 03/17/21 IMPRESSION: 1. Normal coronary calcium score of 0. Patient is low risk for coronary events. 2. Normal coronary origin with right dominance. 3. No evidence of CAD. 4. CAD-RADS 0. Consider non-atherosclerotic  causes of chest pain.  Ablation 06/13/2019 CONCLUSIONS: 1. Sinus rhythm upon presentation.   2. Successful electrical isolation and anatomical encircling of all four pulmonary veins with radiofrequency current. 3. No inducible arrhythmias following ablation both on and off of dobutamine 4. No early apparent complications.  Cardiac monitoring  01/21/2019  The patient was monitored for 13 days, 23 hours.  The predominant rhythm was atrial fibrillation/flutter with an average rate of 85 bpm (range 47-174 bpm). Atrial fibrillation burden was 99%.  There were rare PAC's and PVC's.  Two episodes of non-sustained ventricular tachycardia occurred (less likely atrial fibrillation with aberrancy), lasting up to 18 beats with a maximal rate of 174 bpm.  Two post-termination pauses lasting up to 4.6 seconds were observed.  Patient triggered events correspond to atrial fibrillation. Predominantly atrial fibrillation with two post-termination pauses lasting up to 4.6 seconds, as well as two episodes of likely NSVT lasting up to 18 beats.   Echo 12/18/2018 1. The left ventricle has normal systolic function of 60-65%. The cavity  size is normal. There is no increased left ventricular wall thickness.  Left ventricular diastology could not be evaluated secondary to atrial  fibrillation.  2. The right ventricle has normal systolic function. The cavity in normal  in size. There is no increase in right ventricular wall thickness.  3. Mildly dilated left atrial size.  4. The mitral valve is normal in structure There is mild thickening.  5. The aortic valve is tricuspid. There is mild thickening of the aortic  valve.  6. Normal LV systolic function; mild TR.  Assessment & Plan    Stable angina -- She reports unchanged exertional chest pain or shortness of breath, which has been stable for at least a year.  MPI 07/2019 ruled low risk without ischemia or scar.  At her last visit, she reported  feeling her current symptoms began after her stress test.  Prior cardiac CTA for A. fib ablation without significant CAC.  Given symptoms occur with minimal activity, consistent with CCS class II-III angina, coronary CTA was obtained with findings as above showing coronary artery calcium score of 0 and no evidence of CAD.  We discussed this reassuring finding today with patient understanding.  Also discussed was addition of ranolazine versus long-acting nitrate.  Given her pressure today at 130/70, will start Imdur 30mg  qd for both antianginal effect and BP support.  Continue current dose of amlodipine.  We will continue to hold off on transitioning to BB in the setting of her current sinus infection/pulmonary findings as above and pending results of CXR.  Wheezing/adventitious breath sounds -- She reports progressive wheezing throughout the day, which she notices mostly at night, and which is interfering with her sleep.  She describes this wheezing as occurring at rest and with exertion and related to her recent nasal congestion and drainage. She reports unchanged DOE and denies any other s/sx of volume overload.  Weight and BP improved from previous office visit.  She has not noticed any improvement in her wheezing with her albuterol or recent Mucinex.  She is no longer using Mucinex and wonders if she should continue albuterol, given it is not influencing her symptoms.  We reviewed the side effect profile of albuterol with recommendation to avoid continued use, if it does not make a difference in her breathing.  Coricidin was also discussed for future reference with patient understanding.  Given her lung auscultation today, chest x-ray was advised to ensure she has not developed any bronchitis or pneumonia, especially with her worsening fatigue and weakness, as well as her recent reports of sputum discoloration/blood-tinged sputum.  She denies any recent fevers to her knowledge.  Persistent PAF --No  evidence of recurrence s/p ablation.  Continue current anticoagulation with apixaban.  HTN, goal BP less than  130/80 --BP borderline as noted at her previous office visit.  Given her ongoing chest discomfort as above, as well as her borderline pressures, will start Imdur 30 mg daily today.  HLD -- Continue atorvastatin with ongoing follow-up per PCP.  LDL 63 on last check through [facility] clinic 09/2020, and with LDL goal below 70 given history of stroke.  Medication changes: Start Imdur 30 mg daily. Labs ordered: None Studies / Imaging ordered: Chest x-ray Future considerations: Echo if CP continues after initiation of Imdur and depending on results of CXR  Disposition: RTC pending chest x-ray and after trip to [place] unless ongoing chest discomfort after initiation of Imdur or BP consistently above 130/80.   *Please be aware that the above documentation was completed voice recognition software and may contain dictation errors.      Lennon Alstrom, PA-C 03/30/2021

## 2021-03-30 ENCOUNTER — Encounter: Payer: Self-pay | Admitting: Physician Assistant

## 2021-03-30 ENCOUNTER — Ambulatory Visit
Admission: RE | Admit: 2021-03-30 | Discharge: 2021-03-30 | Disposition: A | Discharge status: Home / Self Care | Payer: Medicare PPO | Source: Ambulatory Visit | Attending: Physician Assistant | Admitting: Physician Assistant

## 2021-03-30 ENCOUNTER — Ambulatory Visit: Payer: Medicare PPO | Admitting: Physician Assistant

## 2021-03-30 ENCOUNTER — Other Ambulatory Visit: Payer: Self-pay

## 2021-03-30 VITALS — BP 130/70 | HR 78 | Ht 62.0 in | Wt 184.0 lb

## 2021-03-30 DIAGNOSIS — I493 Ventricular premature depolarization: Secondary | ICD-10-CM

## 2021-03-30 DIAGNOSIS — R042 Hemoptysis: Secondary | ICD-10-CM | POA: Diagnosis not present

## 2021-03-30 DIAGNOSIS — R0600 Dyspnea, unspecified: Secondary | ICD-10-CM

## 2021-03-30 DIAGNOSIS — R0989 Other specified symptoms and signs involving the circulatory and respiratory systems: Secondary | ICD-10-CM | POA: Insufficient documentation

## 2021-03-30 DIAGNOSIS — I4819 Other persistent atrial fibrillation: Secondary | ICD-10-CM

## 2021-03-30 DIAGNOSIS — I208 Other forms of angina pectoris: Secondary | ICD-10-CM | POA: Diagnosis not present

## 2021-03-30 DIAGNOSIS — R0689 Other abnormalities of breathing: Secondary | ICD-10-CM | POA: Insufficient documentation

## 2021-03-30 DIAGNOSIS — R0609 Other forms of dyspnea: Secondary | ICD-10-CM

## 2021-03-30 DIAGNOSIS — E785 Hyperlipidemia, unspecified: Secondary | ICD-10-CM

## 2021-03-30 DIAGNOSIS — I10 Essential (primary) hypertension: Secondary | ICD-10-CM

## 2021-03-30 DIAGNOSIS — Z79899 Other long term (current) drug therapy: Secondary | ICD-10-CM

## 2021-03-30 MED ORDER — ISOSORBIDE MONONITRATE ER 30 MG PO TB24: 30.0000 mg | ORAL_TABLET | Freq: Every day | ORAL | 2 refills | Status: DC

## 2021-03-30 NOTE — Patient Instructions (Signed)
Medication Instructions:  Your physician has recommended you make the following change in your medication:   START Imdur (Isosorbide) 30mg  daily    - If you continue to have chest pain after taking Imdur, please contact our office for a sooner follow up appointment prior to your trip in June.  *If you need a refill on your cardiac medications before your next appointment, please call your pharmacy*   Lab Work:  None ordered   Testing/Procedures:  -  A chest x-ray takes a picture of the organs and structures inside the chest, including the heart, lungs, and blood vessels. This test can show several things, including, whether the heart is enlarges; whether fluid is building up in the lungs; and whether pacemaker / defibrillator leads are still in place.  -  Please go to the [facility]. You will check in at the front desk to the right as you walk into the atrium.     Follow-Up: At [facility], you and your health needs are our priority.  As part of our continuing mission to provide you with exceptional heart care, we have created designated Provider Care Teams.  These Care Teams include your primary Cardiologist (physician) and Advanced Practice Providers (APPs -  Physician Assistants and Nurse Practitioners) who all work together to provide you with the care you need, when you need it.  We recommend signing up for the patient portal called "MyChart".  Sign up information is provided on this After Visit Summary.  MyChart is used to connect with patients for Virtual Visits (Telemedicine).  Patients are able to view lab/test results, encounter notes, upcoming appointments, etc.  Non-urgent messages can be sent to your provider as well.   To learn more about what you can do with MyChart, go to [facility].    Your next appointment:    Follow up after trip in June (as long as chest x ray today is normal)  The format for your next appointment:   In Person  Provider:    You may see July, MD or one of the following Advanced Practice Providers on your designated Care Team:     Yvonne Kendall, [name]

## 2021-04-04 DIAGNOSIS — I700 Atherosclerosis of aorta: Secondary | ICD-10-CM | POA: Insufficient documentation

## 2021-04-27 IMAGING — CT CT HEART MORPH/PULM VEIN WITH CONTRAST AND WITHOUT CORONARY CALC
2 of 9 series · 6 of 20 positions shown, 7 images · IV contrast (APPLIED)
Comparison: None
COMPARISON: None.
COMPARISON: None

Addendum:
CLINICAL DATA: Pre Ablation

EXAM:
Cardiac Gated CTA
TECHNIQUE: The patient was scanned on a Siemens Force [REDACTED]ice scanner. Gantry
rotation speed was 250 msec with a temporal resolution of 66 msec. A
prospective scan was triggered in the ascending thoracic aorta at
140 HU's Data sets were reconstructed with full mA between 35% and
75% of the R-R interval Images were reviewed using VRT, MIP and MPR
modes. Double oblique images were used to measure the PV diameter
and areas. The patient received 80 cc of contrast at 5 cc/sec
CONTRAST:  Isovue 370 total 80 cc

[Series 11: best diast · axial · 0.38mm/px · z∈[+946,+1010]mm · 3 of 322 slices shown, 4 images]
[im 81/322  vessel]
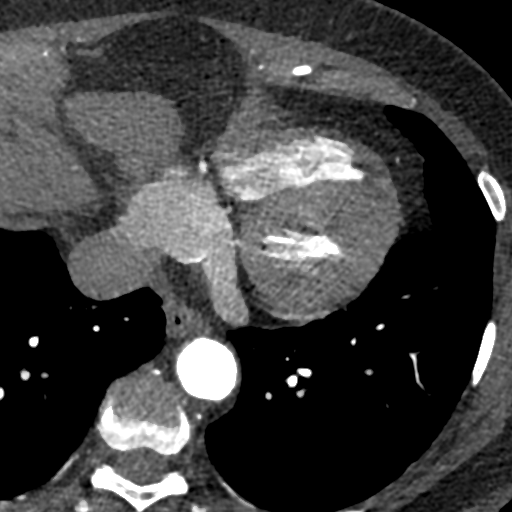
[im 81/322  lung]
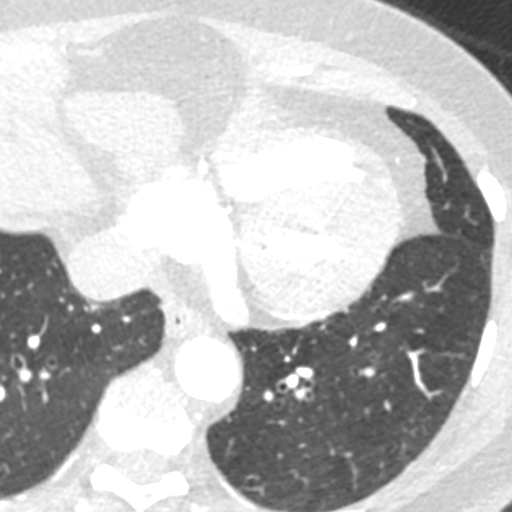
[im 161/322  vessel]
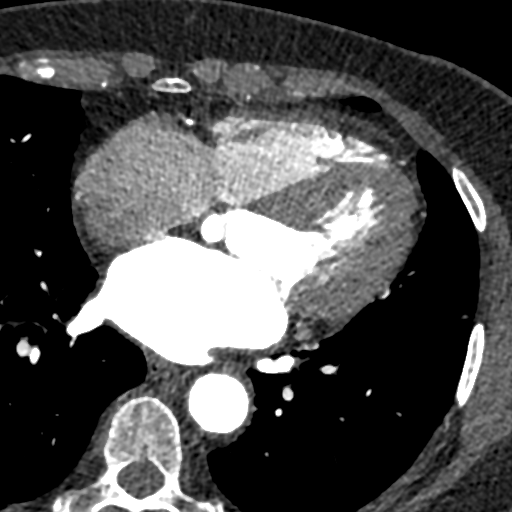
[im 241/322  vessel]
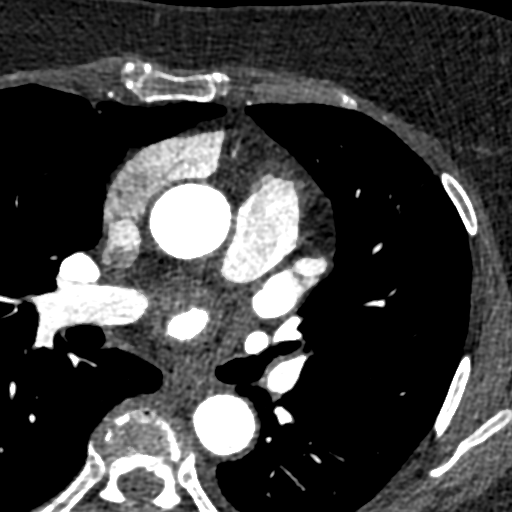

[Series 13: +300 ms · axial · 0.38mm/px · z∈[+946,+1010]mm · 3 of 322 slices shown]
[im 81/322  vessel]
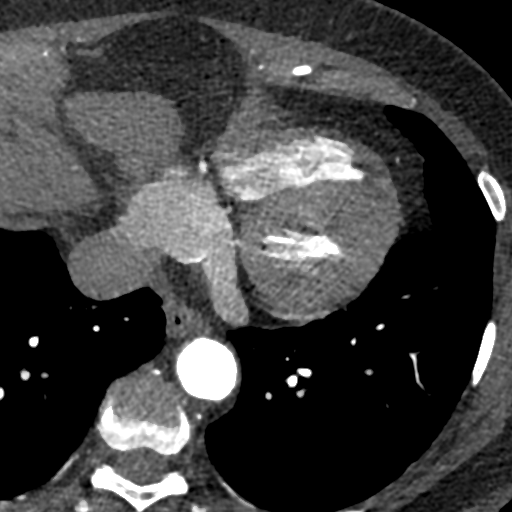
[im 161/322  vessel]
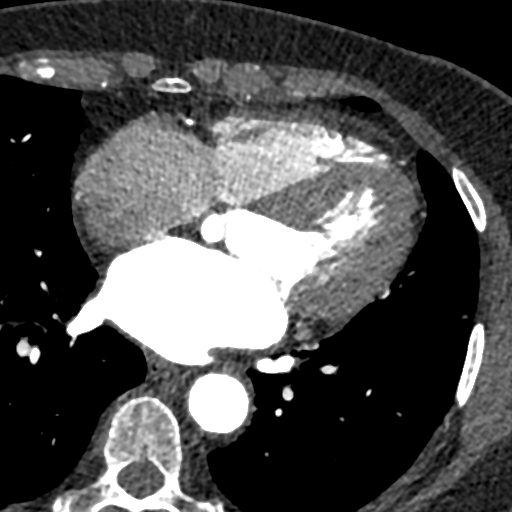
[im 241/322  vessel]
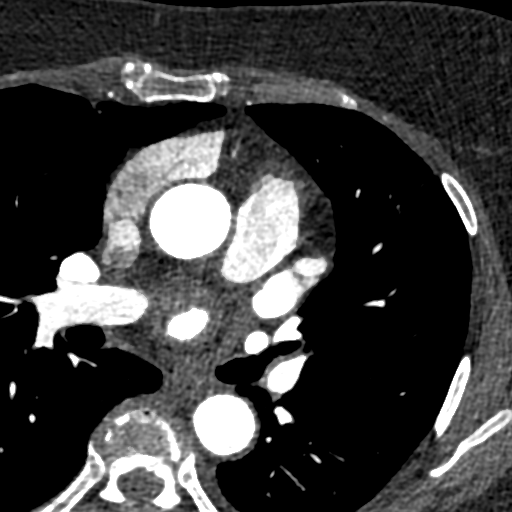

[6 of 20 positions shown; findings below may reference images not displayed]

FINDINGS: There was mild bi atrial enlargement. No TIBUBAY thrombus Possible small
PFO. Normal aortic root 3.0 cm Normal PV anatomy with small right
middle vein. Near common ostium of left veins. No pericardial
effusion

Esophagus courses closest to the LLPV ostium

LUPV:  Ostium 11.4 mm   area 1.4 cm2

LLPV:   Ostium 12 mm  area 0.82 cm2

RUPV:  Ostium 13.5 mm  area 1.7 cm2

RLPV:  Ostium 11.5 mm  area 1.13 cm2

No coronary calcium noted
IMPRESSION: 1.  No TIBUBAY thrombus

2.  Mild bi atrial enlargement

3.  Possible small PFO

4.  Normal aortic root 3.0 cm

5. Normal PV anatomy with small right middle vein and near common
ostium on left

6.  Calcium score 0

7.  Esophagus courses closest to the LLPV ostium

EXAM:
OVER-READ INTERPRETATION  CT CHEST

The following report is an over-read performed by radiologist Dr.
does not include interpretation of cardiac or coronary anatomy or
pathology. The coronary calcium score/coronary CTA interpretation by
the cardiologist is attached.
FINDINGS: The visualized portions of the lower lung fields show no suspicious
nodules, masses, or infiltrates. Mild pleural-parenchymal scarring
is noted bilaterally. The visualized portions of the mediastinum and
chest wall are unremarkable.
IMPRESSION: No significant non-cardiovascular abnormality seen in visualized
portion of the thorax.

*** End of Addendum ***
FINDINGS: There was mild bi atrial enlargement. No TIBUBAY thrombus Possible small
PFO. Normal aortic root 3.0 cm Normal PV anatomy with small right
middle vein. Near common ostium of left veins. No pericardial
effusion

Esophagus courses closest to the LLPV ostium

LUPV:  Ostium 11.4 mm   area 1.4 cm2

LLPV:   Ostium 12 mm  area 0.82 cm2

RUPV:  Ostium 13.5 mm  area 1.7 cm2

RLPV:  Ostium 11.5 mm  area 1.13 cm2

No coronary calcium noted
IMPRESSION: 1.  No TIBUBAY thrombus

2.  Mild bi atrial enlargement

3.  Possible small PFO

4.  Normal aortic root 3.0 cm

5. Normal PV anatomy with small right middle vein and near common
ostium on left

6.  Calcium score 0

7.  Esophagus courses closest to the LLPV ostium

## 2021-05-02 ENCOUNTER — Other Ambulatory Visit: Payer: Self-pay

## 2021-05-02 ENCOUNTER — Encounter: Payer: Self-pay | Admitting: Physician Assistant

## 2021-05-02 ENCOUNTER — Ambulatory Visit: Payer: Medicare PPO | Admitting: Physician Assistant

## 2021-05-02 VITALS — BP 140/70 | HR 58 | Ht 62.0 in | Wt 185.0 lb

## 2021-05-02 DIAGNOSIS — I2089 Other forms of angina pectoris: Secondary | ICD-10-CM

## 2021-05-02 DIAGNOSIS — I208 Other forms of angina pectoris: Secondary | ICD-10-CM

## 2021-05-02 DIAGNOSIS — E785 Hyperlipidemia, unspecified: Secondary | ICD-10-CM

## 2021-05-02 DIAGNOSIS — G4730 Sleep apnea, unspecified: Secondary | ICD-10-CM

## 2021-05-02 DIAGNOSIS — I480 Paroxysmal atrial fibrillation: Secondary | ICD-10-CM | POA: Diagnosis not present

## 2021-05-02 DIAGNOSIS — Z8673 Personal history of transient ischemic attack (TIA), and cerebral infarction without residual deficits: Secondary | ICD-10-CM | POA: Diagnosis not present

## 2021-05-02 DIAGNOSIS — K219 Gastro-esophageal reflux disease without esophagitis: Secondary | ICD-10-CM

## 2021-05-02 DIAGNOSIS — I10 Essential (primary) hypertension: Secondary | ICD-10-CM | POA: Diagnosis not present

## 2021-05-02 DIAGNOSIS — R600 Localized edema: Secondary | ICD-10-CM

## 2021-05-02 NOTE — Patient Instructions (Addendum)
Medication Instructions:  Your physician recommends that you continue on your current medications as directed. Please refer to the Current Medication list given to you today.  *If you need a refill on your cardiac medications before your next appointment, please call your pharmacy*   Lab Work:   None today  Testing/Procedures:  None ordered   Follow-Up: At [facility], you and your health needs are our priority.  As part of our continuing mission to provide you with exceptional heart care, we have created designated Provider Care Teams.  These Care Teams include your primary Cardiologist (physician) and Advanced Practice Providers (APPs -  Physician Assistants and Nurse Practitioners) who all work together to provide you with the care you need, when you need it.  We recommend signing up for the patient portal called "MyChart".  Sign up information is provided on this After Visit Summary.  MyChart is used to connect with patients for Virtual Visits (Telemedicine).  Patients are able to view lab/test results, encounter notes, upcoming appointments, etc.  Non-urgent messages can be sent to your provider as well.   To learn more about what you can do with MyChart, go to [email or web address].    Your next appointment:   6 month(s)  The format for your next appointment:   In Person  Provider:   You may see Yvonne Kendall, MD or one of the following Advanced Practice Providers on your designated Care Team:   Nicolasa Ducking, NP Eula Listen, PA-C [name], PA-C Cadence Fransico Michael, [place] Gillian Shields, NP   Other Instructions  Blood pressure: --We recommend upper arm BP cuffs over that of the wrist. --You can always check your device's accuracy against an office model once a year if possible. You can do so by bringing your cuff into the office and notifying the person that rooms you that you would like to check your cuff against our office readings. --Measure your BP at the  same time each day. Blood pressure varies often throughout the day with higher readings in the morning. BP may also be lower at home than in the office. Do not measure BP right after you wake up or after exercising. Avoid caffeine, tobacco, and alcohol for 30 minutes before taking a measurement.  --Sit quietly for five minutes in a comfortable position with your legs and ankles uncrossed and back supported. Feet flat on the ground. Have your arm supported and at the level of your heart. Always use the same arm when taking your blood pressure. Place the cuff over bare skin rather than clothing. Each time you measure, take an additional reading if abnormal to ensure accurate by waiting 1-3 minutes after the first reading. --Please bring a BP log into the office. It is helpful to document the time of each BP reading, as well as any activity or medications taken around the reading. In addition, it is helpful to include heart rate at the time of the BP reading. Daily weights are also encouraged. --Goal BP is 130/80 or lower. If your BP is low but you are not dizzy, this is fine and preferred over BP higher than 130/80. If your BP is less than 100 for the top number and you are dizzy, call the office. If your blood pressure is consistently elevated with top number above 180 and bottom above 120, this can damage the body. If you have severe increase in your blood pressure or concerning symptoms of severe chest pain, headache with confusion and blurred vision, severe abdominal  or back pain, shortness of breath, seizures, or loss of consciousness, go to the emergency department.

## 2021-05-02 NOTE — Progress Notes (Signed)
Office Visit    Patient Name: Emma Rivera Date of Encounter: 05/02/2021  PCP:  [name], MD   [facility]  Cardiologist:  Yvonne Kendall, MD  Advanced Practice Provider:  No care team member to display Electrophysiologist:  Will Jorja Loa, MD :[id number]}   Chief Complaint    Chief Complaint  Patient presents with   Follow-up    1 Month follow up. Medications verbally reviewed with patient.     80 y.o. female with history of stroke (12/2018) and subsequent diagnosis of paroxysmal atrial fibrillation, COVID-19 11/2020, allergies, hypertension, GERD, esophageal spasm, and who presents today for 1 month follow-up.  Past Medical History    Past Medical History:  Diagnosis Date   Atrial fibrillation (HCC)    Depression    Esophageal spasm    Esophageal spasm    GERD (gastroesophageal reflux disease)    Heart murmur    HOH (hard of hearing)    Hypertension    Pre-diabetes    Stroke [facility])    Past Surgical History:  Procedure Laterality Date   ATRIAL FIBRILLATION ABLATION N/A 06/13/2019   Procedure: ATRIAL FIBRILLATION ABLATION;  Surgeon: Regan Lemming, MD;  Location: [facility] INVASIVE CV LAB;  Service: Cardiovascular;  Laterality: N/A;   CARDIAC CATHETERIZATION     CARDIOVERSION N/A 04/01/2019   Procedure: CARDIOVERSION;  Surgeon: Lars Masson, MD;  Location: [facility] ENDOSCOPY;  Service: Cardiovascular;  Laterality: N/A;   CATARACT EXTRACTION W/PHACO Left 09/09/2015   Procedure: CATARACT EXTRACTION PHACO AND INTRAOCULAR LENS PLACEMENT (IOC);  Surgeon: Lia Hopping, MD;  Location: [facility] ORS;  Service: Ophthalmology;  Laterality: Left;  [place]   1:10.5 AP     8.6 CDE  6.05 casette lot #  [id number]   CATARACT EXTRACTION W/PHACO Right 09/30/2015   Procedure: CATARACT EXTRACTION PHACO AND INTRAOCULAR LENS PLACEMENT (IOC);  Surgeon: Lia Hopping, MD;  Location: [facility] ORS;  Service: Ophthalmology;  Laterality: Right;  [place] 01:00.3 AP%:  11.6 CDE: 7.00 FLUID LOT# [id number]   COLONOSCOPY     COLONOSCOPY WITH PROPOFOL N/A 04/04/2017   Procedure: COLONOSCOPY WITH PROPOFOL;  Surgeon: Scot Jun, MD;  Location: [facility] ENDOSCOPY;  Service: Endoscopy;  Laterality: N/A;   TONSILLECTOMY      Allergies  Allergies  Allergen Reactions   Sulfa Antibiotics Nausea Only    History of Present Illness    Emma Rivera is a 80 y.o. female with PMH as above.  She has history of stroke (12/2018) with subsequent diagnosis of paroxysmal atrial fibrillation, hypertension, GERD, and esophageal spasm.    She was seen by Dr. Elberta Fortis 12/2019 to follow-up on her atrial fibrillation s/p catheter ablation.  She noted intermittent exertional chest pain, improved with rest.  MPI 07/2019 low risk without evidence of ischemia or scar.  Prior cCTA in anticipation of ablation 05/2019 without significant CAD.    She reports COVID-19 11/2020.  She has a history of allergies, though these respond well to Claritin.  She was last seen in office 03/02/2021.  She reported regular episodes of CP and pressure when walking more than a slow pace or going up an incline.  CP was associated with SOB with both CP and dyspnea stopping quickly when she rested.  Symptoms were reportedly stable for at least a year but new since her MPI 07/2019.  No symptoms at rest.  She reported occasional palpitations, lasting a few seconds, and described as her heart beating harder.    She was  going to travel to the [place] in June, and she was concerned about not being able to keep up with her group on account of exertional chest pain and shortness of breath.    Given her exertional symptoms, recommendation was to obtain coronary CTA for further evaluation.  If no significant CAD observed, addition of ranolazine or long-acting nitrate recommended.  She was continued on amlodipine.  It was noted that beta-blocker was not added at that time in the setting of low normal resting heart rate.   She was continued on apixaban for anticoagulation.  It was noted that her BP was elevated/borderline but typically better at home.  Medication changes were deferred in that setting.  She was continued on atorvastatin with ongoing follow-up per PCP.   She subsequently obtained a coronary CTA that showed a normal coronary calcium score of 0.  No evidence of CAD was noted.  Seen 03/30/2021 with report of exertional symptoms including shortness of breath and chest discomfort.  She experienced chest pain and shortness of breath with walking, resolving within minutes of rest.  She reported a recent cold/allergies with corresponding cough and nasal congestion the last couple of weeks.  She had yellow to green phlegm, worse in the morning, and recent blood tinged sputum.  She had tried both albuterol and Mucinex without relief.  She was wheezing at night, interfering with her sleep, and had fatigue and lack of motivation.  No symptoms consistent with volume overload, and euvolemic on exam.  Weight was decreased from previous clinic visits.  Home SBP 130s with DBP 70s to 80s.  Lowest SBP 120s and highest 140.  She reported medication compliance.  She had unchanged tachypalpitations on amlodipine.  Coronary CTA reviewed with Imdur started, though after this visit, subsequently discontinued by patient due to headache.  Heart rate was noted to be elevated with recommendation to consider beta-blocker at RTC if still elevated at that time but deferred given suspicion 2/2 possible current cold or bronchitis .  Due to the patient's symptoms as above with cough and congestion, as well as exam with adventitious breath sounds and not consistent with volume overload, chest x-ray was ordered by myself.  Chest x-ray showed bilateral chronic opacities suggestive of scarring.  She followed up with pulmonology with subsequent treatment that resulted in complete relief of her symptoms.  She was prescribed albuterol, prednisone,  Flovent, and azithromycin.    Following pulmonology treatment, she was able to go to [place] and hike/enjoy her trip without coughing/sputum.  She noted her breathing was significantly improved and she was able to walk further without shortness of breath.  She still experienced some chest discomfort with walking uphill.  During her time in [place], she noted lower extremity edema that began a few days into her trip and resolved a few days later.  No asymmetric edema, warmth, or palpable cord.  No associated chest pain or shortness of breath at rest.   Of note, she is currently undergoing a home sleep study by pulmonology for ongoing sleep disordered breathing and fatigue as noted at prior office visit above.  Results pending.  Today, 05/02/2021, she returns to clinic and notes that she had an excellent trip to [place] and was able to breathe with complete resolution of her previous cough and shortness of breath as outlined above.  No further MSK pain from coughing reported. BP today 140/70 with home SBP 120s to 130s.  We discussed increasing amlodipine for antianginal effect and further BP control  with patient preference to avoid medication changes and instead self increase amlodipine at home if SBP consistently over 130 between visits.  She reported edema a couple of days into her trip that lasted approximately 3 days before self resolving and without recurrence since that time.  No symptoms concerning for DVT as detailed above.  She denies any chest pain or shortness of breath at rest at that time or since her previous visit.  She does continue to note chest pain with walking uphill.  She denies any symptoms consistent with volume overload today other than the lower extremity edema that only lasted a few days before falling during her trip.  No presyncope or syncope.  EKG sinus bradycardia.  She is undergoing sleep study as above due to sleep disordered breathing and fatigue/poor sleep as previously noted.  She  denies any signs or symptoms of bleeding.  She reports medication compliance.  Home Medications   Current Outpatient Medications  Medication Instructions   acetaminophen (TYLENOL) 1,000 mg, Oral, Every 6 hours PRN   albuterol (VENTOLIN HFA) 108 (90 Base) MCG/ACT inhaler INHALE 2 PUFFS PRIOR TO EXERCISE   amLODipine (NORVASC) 5 MG tablet TAKE 1 TABLET BY MOUTH EVERY DAY   apixaban (ELIQUIS) 5 mg, Oral, 2 times daily   atorvastatin (LIPITOR) 20 mg, Oral, Daily-1800   Carboxymethylcellul-Glycerin (LUBRICATING EYE DROPS OP) 1 drop, Both Eyes, Daily PRN   citalopram (CELEXA) 20 mg, Oral, Daily   fluticasone (FLOVENT HFA) 220 MCG/ACT inhaler Inhalation   hydrochlorothiazide (HYDRODIURIL) 25 mg, Oral, Daily   loratadine (CLARITIN) 10 mg, Oral, Daily PRN   losartan (COZAAR) 100 mg, Oral, Every evening   Multiple Vitamin (MULTIVITAMIN) tablet 1 tablet, Oral, Daily   omeprazole (PRILOSEC) 20 mg, Oral, Daily   tolterodine (DETROL LA) 2 mg, Oral, 2 times daily     Review of Systems    She reports in her breathing and resolution of cough since treated by pulmonology.  She has ongoing exertional symptoms with chest pain noted when walking uphill.  She has unchanged tachypalpitations.  She reported edema within a few days of her trip and resolving several days later without recurrence.  Edema was not asymmetric and no erythema or palpable cord noted.  She denies chest pain or shortness of breath at rest.  She denies pnd, orthopnea, n, v, dizziness, syncope, weight gain, or early satiety. All other systems reviewed and are otherwise negative except as noted above.  Physical Exam    VS:  BP 140/70 (BP Location: Left Arm, Patient Position: Sitting, Cuff Size: Normal)   Pulse (!) 58   Ht 5\' 2"  (1.575 m)   Wt 185 lb (83.9 kg)   SpO2 95%   BMI 33.84 kg/m  , BMI Body mass index is 33.84 kg/m. GEN: Well nourished, well developed, in no acute distress.  Facemask in place. HEENT: normal. Neck: Supple,  no JVD, carotid bruits, or masses. Cardiac: RRR, no murmurs, rubs, or gallops. No clubbing, cyanosis, edema.  Radials/DP/PT 2+ and equal bilaterally.  Respiratory: Clear to auscultation bilaterally with normal respiratory effort GI: Soft, nontender, nondistended, BS + x 4. MS: no deformity or atrophy. Skin: warm and dry, no rash. Neuro:  Strength and sensation are intact. Psych: Normal affect.  Accessory Clinical Findings    ECG personally reviewed by me today - SB, 58bpm,- no acute changes.  VITALS Reviewed today   Temp Readings from Last 3 Encounters:  02/12/20 (!) 97.5 F (36.4 C)  08/14/19 (!) 97.3 F (36.3  C)  06/13/19 98 F (36.7 C) (Oral)   BP Readings from Last 3 Encounters:  05/02/21 140/70  03/30/21 130/70  03/17/21 136/67   Pulse Readings from Last 3 Encounters:  05/02/21 (!) 58  03/30/21 78  03/17/21 (!) 56    Wt Readings from Last 3 Encounters:  05/02/21 185 lb (83.9 kg)  03/30/21 184 lb (83.5 kg)  03/02/21 187 lb (84.8 kg)     LABS  reviewed today   Lab Results  Component Value Date   WBC 5.6 01/04/2021   HGB 13.2 01/04/2021   HCT 39.3 01/04/2021   MCV 96 01/04/2021   PLT 214 01/04/2021   Lab Results  Component Value Date   CREATININE 0.98 03/02/2021   BUN 13 03/02/2021   NA 146 (H) 03/02/2021   K 4.0 03/02/2021   CL 104 03/02/2021   CO2 28 03/02/2021   Lab Results  Component Value Date   ALT 22 12/16/2018   AST 26 12/16/2018   ALKPHOS 49 12/16/2018   BILITOT 1.3 (H) 12/16/2018   Lab Results  Component Value Date   CHOL 164 12/17/2018   HDL 56 12/17/2018   LDLCALC 97 12/17/2018   TRIG 54 12/17/2018   CHOLHDL 2.9 12/17/2018    Lab Results  Component Value Date   HGBA1C 5.7 (H) 12/17/2018   No results found for: TSH   STUDIES/PROCEDURES reviewed today   cCTA 03/17/21 IMPRESSION: 1. Normal coronary calcium score of 0. Patient is low risk for coronary events. 2. Normal coronary origin with right dominance. 3. No evidence  of CAD. 4. CAD-RADS 0. Consider non-atherosclerotic causes of chest pain.  Ablation 06/13/2019 CONCLUSIONS: 1. Sinus rhythm upon presentation.   2. Successful electrical isolation and anatomical encircling of all four pulmonary veins with radiofrequency current. 3. No inducible arrhythmias following ablation both on and off of dobutamine 4. No early apparent complications.  Cardiac monitoring 01/21/2019 The patient was monitored for 13 days, 23 hours. The predominant rhythm was atrial fibrillation/flutter with an average rate of 85 bpm (range 47-174 bpm). Atrial fibrillation burden was 99%. There were rare PAC's and PVC's. Two episodes of non-sustained ventricular tachycardia occurred (less likely atrial fibrillation with aberrancy), lasting up to 18 beats with a maximal rate of 174 bpm. Two post-termination pauses lasting up to 4.6 seconds were observed. Patient triggered events correspond to atrial fibrillation. Predominantly atrial fibrillation with two post-termination pauses lasting up to 4.6 seconds, as well as two episodes of likely NSVT lasting up to 18 beats.   Echo 12/18/2018  1. The left ventricle has normal systolic function of 60-65%. The cavity  size is normal. There is no increased left ventricular wall thickness.  Left ventricular diastology could not be evaluated secondary to atrial  fibrillation.   2. The right ventricle has normal systolic function. The cavity in normal  in size. There is no increase in right ventricular wall thickness.   3. Mildly dilated left atrial size.   4. The mitral valve is normal in structure There is mild thickening.   5. The aortic valve is tricuspid. There is mild thickening of the aortic  valve.   6. Normal LV systolic function; mild TR.  Assessment & Plan    Stable angina/dyspnea -- She reports unchanged exertional chest pain walking uphill.  She does have history of COVID-19 11/2020, though she notes dyspnea even before that time.   MPI 07/2019 ruled low risk without ischemia or scar.  At her last visit, she reported feeling  her current symptoms began after her stress test.  Prior cardiac CTA for A. fib ablation without significant CAC.  Given symptoms occur with minimal activity, consistent with CCS class II-III angina, coronary CTA was obtained with findings as above showing coronary artery calcium score of 0 and no evidence of CAD.  We discussed this reassuring finding today with patient understanding.  At her last clinic visit, we started Imdur with patient report of headache with this medication -Imdur now added to her intolerances.  Addition of ranolazine discussed today but deferred given cost and patient preference to avoid any medication changes today.  Given elevated BP, recommended increasing from amlodipine 5 mg to amlodipine 10 mg for BP support and antianginal effect.  She politely declines this medication change.  She will monitor her BP at home and increase to amlodipine 10 mg if home BP consistently over 130/80, at which time she will call the office.    Paroxysmal atrial fibrillation s/p ablation Sinus bradycardia, asymptomatic --No evidence of recurrence s/p ablation.  Currently SB but denies presyncope/lightheadedness.  She is not on a beta-blocker with rate 58 bpm.  Continue current anticoagulation with apixaban.  No signs or symptoms consistent with bleeding.  Avoid frequent albuterol and prednisone use if possible given less desirable cardiac side effect profile including exacerbation of ventricular rates.  As below, sleep apnea study recommended given association between sleep apnea and atrial fibrillation with patient report of fatigue and sleep disordered breathing.  Home sleep study currently in progress per pulmonology as below.  HTN, goal BP less than 130/80 --BP elevated at 140/70 as seen during previous clinic visits.  Given her ongoing chest discomfort as above, as well as her borderline /elevated  pressures, recommendation was to increase to amlodipine 10 mg daily for antianginal effect and BP support.  She politely declines this change today with preference to instead monitor her pressure at home.  She will call the office if BP consistently elevated above 130/80 at home, at which time she will increase to amlodipine 10 mg daily.  Continue amlodipine 5 mg daily for now with losartan and HCTZ.  Avoid frequent albuterol and prednisone use as above, given this can elevate BP.  Reviewed salt and fluid restrictions.  Lifestyle changes discussed, including dietary changes and increased activity.  As previously noted, treatment of sleep apnea recommended if found on home sleep study, as untreated sleep apnea can elevate BP over time.  HLD, LDL goal below 70 -- Continue atorvastatin with ongoing follow-up per PCP.  LDL 63 on last check through [facility] 09/2020, and with LDL goal below 70 given history of stroke.  Lower extremity edema, resolved --She reports lower extremity edema a few days into her trip, resolving several days thereafter.  Consider that lower extremity edema could have been 2/2 increased salt in restaurant food, dependent edema with travel on buses, or transient increase in volume status after prednisone.  No asymmetric edema.  No erythema or palpable cord noted.  She denies recurrence of edema since then with no edema noted on exam today.  We discussed elevation, compression stockings if recurrence in the future.  Current HCTZ.  Wheezing/adventitious breath sounds, resolved --After her last clinic visit, chest x-ray ordered.  Chest x-ray showed chronic opacities bilaterally, suggestive of scarring.  It was noted that she had history of COVID-19 in January 2022, though dyspnea reported prior to this COVID-19 as well.  She also noted allergies, controlled with Claritin.  Following chest x-ray, pulmonology referral  recommended with patient seeing pulmonology before her trip to [place]  with subsequent treatment and resolution of her symptoms/cough with sputum and improvement in breathing status.  Caution with prednisone and albuterol given cardiac side effect profile as above.  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  She was seen by pulmonology as above with in-home sleep study ordered.  We discussed recommendation for treatment of sleep apnea if found on home study from an overall cardiovascular standpoint and to help with BP and risk of recurrent atrial fibrillation.  GERD --Continue PPI.   Medication changes: None.  Politely declines increased dose amlodipine 10 mg daily.  She will call the office if home BP consistently elevated over 130/80, at which time she will increase to amlodipine 10 mg daily. Labs ordered: None Studies / Imaging ordered: None, though currently undergoing a sleep study by pulmonology Future considerations: Increased dose amlodipine /further BP support, repeat echo if ongoing symptoms Disposition: RTC 6 months.   *Please be aware that the above documentation was completed voice recognition software and may contain dictation errors.      Lennon AlstromJacquelyn D Falyn Rubel, PA-C 05/02/2021

## 2021-06-03 ENCOUNTER — Other Ambulatory Visit: Payer: Self-pay | Admitting: Internal Medicine

## 2021-07-07 ENCOUNTER — Encounter: Payer: Self-pay | Admitting: Cardiology

## 2021-07-07 ENCOUNTER — Ambulatory Visit: Payer: Medicare PPO | Admitting: Cardiology

## 2021-07-07 ENCOUNTER — Other Ambulatory Visit: Payer: Self-pay

## 2021-07-07 VITALS — BP 140/82 | HR 72 | Ht 62.0 in | Wt 184.0 lb

## 2021-07-07 DIAGNOSIS — I4819 Other persistent atrial fibrillation: Secondary | ICD-10-CM

## 2021-07-07 MED ORDER — AMLODIPINE BESYLATE 10 MG PO TABS: 5.0000 mg | ORAL_TABLET | Freq: Every day | ORAL | 3 refills | Status: DC

## 2021-07-07 NOTE — Patient Instructions (Signed)
Medication Instructions:  INCREASE AMLODIPINE TO 10 MG EVERY DAY  *If you need a refill on your cardiac medications before your next appointment, please call your pharmacy*   Lab Work: NONE If you have labs (blood work) drawn today and your tests are completely normal, you will receive your results only by: MyChart Message (if you have MyChart) OR A paper copy in the mail If you have any lab test that is abnormal or we need to change your treatment, we will call you to review the results.   Testing/Procedures: NONE   Follow-Up: At [facility], you and your health needs are our priority.  As part of our continuing mission to provide you with exceptional heart care, we have created designated Provider Care Teams.  These Care Teams include your primary Cardiologist (physician) and Advanced Practice Providers (APPs -  Physician Assistants and Nurse Practitioners) who all work together to provide you with the care you need, when you need it.  We recommend signing up for the patient portal called "MyChart".  Sign up information is provided on this After Visit Summary.  MyChart is used to connect with patients for Virtual Visits (Telemedicine).  Patients are able to view lab/test results, encounter notes, upcoming appointments, etc.  Non-urgent messages can be sent to your provider as well.   To learn more about what you can do with MyChart, go to [email or web address].    Your next appointment:   6 month(s)  The format for your next appointment:   In Person  Provider:   DR CAMNITZ  Other Instructions NONE

## 2021-07-07 NOTE — Addendum Note (Signed)
Addended by: Scherrie Bateman E on: 07/07/2021 01:44 PM   Modules accepted: Orders

## 2021-07-07 NOTE — Progress Notes (Signed)
Electrophysiology Office Note   Date:  07/07/2021   ID:  Emma Rivera, DOB [birth year], MRN [id number]  PCP:  [name], MD  Cardiologist:  End Primary Electrophysiologist:  Tamsin Nader Jorja Loa, MD    Chief Complaint: AF   History of Present Illness: Emma Rivera is a 80 y.o. female who is being seen today for the evaluation of AF at the request of [name], MD. Presenting today for electrophysiology evaluation.  She has a history significant for persistent atrial fibrillation, hypertension, diabetes, CVA.  She is status post atrial fibrillation ablation 06/13/2019.  Today, denies symptoms of palpitations, chest pain, shortness of breath, orthopnea, PND, lower extremity edema, claudication, dizziness, presyncope, syncope, bleeding, or neurologic sequela. The patient is tolerating medications without difficulties.  Since being seen she has done well.  She is noted no further episodes of atrial fibrillation.   Past Medical History:  Diagnosis Date   Atrial fibrillation (HCC)    Depression    Esophageal spasm    Esophageal spasm    GERD (gastroesophageal reflux disease)    Heart murmur    HOH (hard of hearing)    Hypertension    Pre-diabetes    Stroke [facility])    Past Surgical History:  Procedure Laterality Date   ATRIAL FIBRILLATION ABLATION N/A 06/13/2019   Procedure: ATRIAL FIBRILLATION ABLATION;  Surgeon: Regan Lemming, MD;  [facility];  Service: Cardiovascular;  Laterality: N/A;   CARDIAC CATHETERIZATION     CARDIOVERSION N/A 04/01/2019   Procedure: CARDIOVERSION;  Surgeon: Lars Masson, MD;  Location: [facility] ENDOSCOPY;  Service: Cardiovascular;  Laterality: N/A;   CATARACT EXTRACTION W/PHACO Left 09/09/2015   Procedure: CATARACT EXTRACTION PHACO AND INTRAOCULAR LENS PLACEMENT (IOC);  Surgeon: Lia Hopping, MD;  Location: [facility] ORS;  Service: Ophthalmology;  Laterality: Left;  [place]   1:10.5 AP     8.6 CDE  6.05 casette lot #   [id number]   CATARACT EXTRACTION W/PHACO Right 09/30/2015   Procedure: CATARACT EXTRACTION PHACO AND INTRAOCULAR LENS PLACEMENT (IOC);  Surgeon: Lia Hopping, MD;  Location: [facility] ORS;  Service: Ophthalmology;  Laterality: Right;  [place] 01:00.3 AP%: 11.6 CDE: 7.00 FLUID LOT# [id number]   COLONOSCOPY     COLONOSCOPY WITH PROPOFOL N/A 04/04/2017   Procedure: COLONOSCOPY WITH PROPOFOL;  Surgeon: Scot Jun, MD;  Location: [facility] ENDOSCOPY;  Service: Endoscopy;  Laterality: N/A;   TONSILLECTOMY       Current Outpatient Medications  Medication Sig Dispense Refill   acetaminophen (TYLENOL) 500 MG tablet Take 1,000 mg by mouth every 6 (six) hours as needed for moderate pain or headache.     albuterol (VENTOLIN HFA) 108 (90 Base) MCG/ACT inhaler INHALE 2 PUFFS PRIOR TO EXERCISE     amLODipine (NORVASC) 5 MG tablet TAKE 1 TABLET BY MOUTH EVERY DAY 90 tablet 1   apixaban (ELIQUIS) 5 MG TABS tablet Take 1 tablet (5 mg total) by mouth 2 (two) times daily. 60 tablet 0   atorvastatin (LIPITOR) 20 MG tablet TAKE 1 TABLET (20 MG TOTAL) BY MOUTH DAILY AT 6 PM. 90 tablet 2   Carboxymethylcellul-Glycerin (LUBRICATING EYE DROPS OP) Place 1 drop into both eyes daily as needed (dry eyes).     citalopram (CELEXA) 20 MG tablet Take 20 mg by mouth daily.     fluticasone (FLOVENT HFA) 220 MCG/ACT inhaler Inhale into the lungs.     hydrochlorothiazide (HYDRODIURIL) 25 MG tablet Take 25 mg by mouth daily.     loratadine (  CLARITIN) 10 MG tablet Take 10 mg by mouth daily as needed for allergies.     losartan (COZAAR) 100 MG tablet Take 100 mg by mouth every evening.      Multiple Vitamin (MULTIVITAMIN) tablet Take 1 tablet by mouth daily.     omeprazole (PRILOSEC) 20 MG capsule Take 20 mg by mouth daily.     tolterodine (DETROL LA) 2 MG 24 hr capsule Take 2 mg by mouth 2 (two) times daily.      No current facility-administered medications for this visit.    Allergies:   Imdur [isosorbide nitrate] and Sulfa  antibiotics   Social History:  The patient  reports that she has never smoked. She has never used smokeless tobacco. She reports current alcohol use. She reports that she does not use drugs.   Family History:  The patient's family history includes Breast cancer in her maternal aunt; Heart disease in her father and mother; Hypertension in her father and mother.   ROS:  Please see the history of present illness.   Otherwise, review of systems is positive for none.   All other systems are reviewed and negative.   PHYSICAL EXAM: VS:  BP 140/82   Pulse 72   Ht 5\' 2"  (1.575 m)   Wt 184 lb (83.5 kg)   SpO2 96%   BMI 33.65 kg/m  , BMI Body mass index is 33.65 kg/m. GEN: Well nourished, well developed, in no acute distress  HEENT: normal  Neck: no JVD, carotid bruits, or masses Cardiac: RRR; no murmurs, rubs, or gallops,no edema  Respiratory:  clear to auscultation bilaterally, normal work of breathing GI: soft, nontender, nondistended, + BS MS: no deformity or atrophy  Skin: warm and dry Neuro:  Strength and sensation are intact Psych: euthymic mood, full affect  EKG:  EKG is ordered today. Personal review of the ekg ordered shows sinus rhythm, rate 72  Recent Labs: 01/04/2021: Hemoglobin 13.2; Platelets 214 03/02/2021: BUN 13; Creatinine, Ser 0.98; Potassium 4.0; Sodium 146    Lipid Panel     Component Value Date/Time   CHOL 164 12/17/2018 0507   TRIG 54 12/17/2018 0507   HDL 56 12/17/2018 0507   CHOLHDL 2.9 12/17/2018 0507   VLDL 11 12/17/2018 0507   LDLCALC 97 12/17/2018 0507     Wt Readings from Last 3 Encounters:  07/07/21 184 lb (83.5 kg)  05/02/21 185 lb (83.9 kg)  03/30/21 184 lb (83.5 kg)    For the Eliquis  Other studies Reviewed: Additional studies/ records that were reviewed today include: TTE 12/18/18  Review of the above records today demonstrates:   1. The left ventricle has normal systolic function of 60-65%. The cavity size is normal. There is no  increased left ventricular wall thickness. Left ventricular diastology could not be evaluated secondary to atrial fibrillation.  2. The right ventricle has normal systolic function. The cavity in normal in size. There is no increase in right ventricular wall thickness.  3. Mildly dilated left atrial size.  4. The mitral valve is normal in structure There is mild thickening.  5. The aortic valve is tricuspid. There is mild thickening of the aortic valve.  6. Normal LV systolic function; mild TR.   ASSESSMENT AND PLAN:  1.  Persistent atrial fibrillation: Status post ablation 06/13/2019.  Currently on Eliquis with a CHA2DS2-VASc of 6.  She has not had any ablation.  She currently feels well.  2.  Hypertension: Mildly elevated today.  Increase Norvasc to  10 mg.  3.  Obesity: Lifestyle modification with diet and exercise   Current medicines are reviewed at length with the patient today.   The patient does not have concerns regarding her medicines.  The following changes were made today: Increase Norvasc  Labs/ tests ordered today include:  Orders Placed This Encounter  Procedures   EKG 12-Lead      Disposition:   FU with Greogry Goodwyn 12 months  Signed, Kule Gascoigne Jorja Loa, MD  07/07/2021 10:40 AM     [facility] [address] [place] [address] [phone] (office) [phone] (fax)

## 2021-08-30 ENCOUNTER — Other Ambulatory Visit: Payer: Self-pay | Admitting: *Deleted

## 2021-08-30 ENCOUNTER — Telehealth: Payer: Self-pay | Admitting: Cardiology

## 2021-08-30 MED ORDER — AMLODIPINE BESYLATE 10 MG PO TABS: 10.0000 mg | ORAL_TABLET | Freq: Every day | ORAL | 3 refills | Status: DC

## 2021-08-30 NOTE — Telephone Encounter (Signed)
*  STAT* If patient is at the pharmacy, call can be transferred to refill team.   1. Which medications need to be refilled? (please list name of each medication and dose if known) amLODipine (NORVASC) 10 MG tablet (she takes one full tablet a day, the full 10mg )  2. Which pharmacy/location (including street and city if local pharmacy) is medication to be sent to? [id number] - [place], [place] - [address]  3. Do they need a 30 day or 90 day supply? 90 day

## 2021-12-09 NOTE — Progress Notes (Signed)
Cardiology Office Note:    Date:  12/13/2021   ID:  Emma Rivera, DOB 01-21-1941, MRN [id number]  PCP:  [name], MD  [facility] Cardiologist:  Yvonne Kendall, MD  [facility] Electrophysiologist:  Will Jorja Loa, MD   Referring MD: [name], MD   Chief Complaint: 6 month follow-up  History of Present Illness:    Emma Rivera is a 81 y.o. female with a hx of with history of stroke (12/2018) and subsequent diagnosis of paroxysmal atrial fibrillation s/p ablation 05/2019, COVID-19 11/2020, allergies, hypertension, GERD, esophageal spasm who presents for 6 month follow-up.   She was seen by Dr. Elberta Fortis 12/2019 to follow-up on her atrial fibrillation s/p catheter ablation.  She noted intermittent exertional chest pain, improved with rest.  MPI 07/2019 low risk without evidence of ischemia or scar.  Prior cCTA in anticipation of ablation 05/2019 without significant CAD.     She was last seen in office 03/02/2021.  She reported regular episodes of CP and pressure when walking more than a slow pace or going up an incline. Given her exertional symptoms, recommendation was to obtain coronary CTA for further evaluation. She obtained a coronary CTA that showed a normal coronary calcium score of 0.  No evidence of CAD was noted.  Last seen by EP 07/07/21 and was doing well.   Today, the patient is overall doing well with no concerns. She has occasional palpitations. She has SOB with heavy exertion, not changed or different. No chest pain. She has been started on an inhaler. BP today is good. HR is 54bpm, no dizziness or lightheadedness. Patient does Yoga and line dancing. Diet could be better. She is trying to cut her portions and not so much snacking. No LLE, orthopnea, pnd. She had a sleep study that showed sleep apnea, didn't want to use CPAP. They agreed to trial 6 months of weight loss.   Labs in September showed AST 48, ALT 59.   Past Medical History:  Diagnosis Date    Atrial fibrillation (HCC)    Depression    Esophageal spasm    Esophageal spasm    GERD (gastroesophageal reflux disease)    Heart murmur    HOH (hard of hearing)    Hypertension    Pre-diabetes    Stroke [facility])     Past Surgical History:  Procedure Laterality Date   ATRIAL FIBRILLATION ABLATION N/A 06/13/2019   Procedure: ATRIAL FIBRILLATION ABLATION;  Surgeon: Regan Lemming, MD;  [facility]: [facility] CV LAB;  Service: Cardiovascular;  Laterality: N/A;   CARDIAC CATHETERIZATION     CARDIOVERSION N/A 04/01/2019   Procedure: CARDIOVERSION;  Surgeon: Lars Masson, MD;  Location: [facility] ENDOSCOPY;  Service: Cardiovascular;  Laterality: N/A;   CATARACT EXTRACTION W/PHACO Left 09/09/2015   Procedure: CATARACT EXTRACTION PHACO AND INTRAOCULAR LENS PLACEMENT (IOC);  Surgeon: Lia Hopping, MD;  Location: [facility] ORS;  Service: Ophthalmology;  Laterality: Left;  [place]   1:10.5 AP     8.6 CDE  6.05 casette lot #  [id number]   CATARACT EXTRACTION W/PHACO Right 09/30/2015   Procedure: CATARACT EXTRACTION PHACO AND INTRAOCULAR LENS PLACEMENT (IOC);  Surgeon: Lia Hopping, MD;  Location: [facility] ORS;  Service: Ophthalmology;  Laterality: Right;  [place] 01:00.3 AP%: 11.6 CDE: 7.00 FLUID LOT# [id number]   COLONOSCOPY     COLONOSCOPY WITH PROPOFOL N/A 04/04/2017   Procedure: COLONOSCOPY WITH PROPOFOL;  Surgeon: Scot Jun, MD;  Location: [facility] ENDOSCOPY;  Service: Endoscopy;  Laterality: N/A;  TONSILLECTOMY      Current Medications: Current Meds  Medication Sig   acetaminophen (TYLENOL) 500 MG tablet Take 1,000 mg by mouth every 6 (six) hours as needed for moderate pain or headache.   albuterol (VENTOLIN HFA) 108 (90 Base) MCG/ACT inhaler INHALE 2 PUFFS PRIOR TO EXERCISE   amLODipine (NORVASC) 10 MG tablet Take 1 tablet (10 mg total) by mouth daily.   apixaban (ELIQUIS) 5 MG TABS tablet Take 1 tablet (5 mg total) by mouth 2 (two) times daily.   atorvastatin (LIPITOR) 20 MG tablet TAKE 1  TABLET (20 MG TOTAL) BY MOUTH DAILY AT 6 PM.   Carboxymethylcellul-Glycerin (LUBRICATING EYE DROPS OP) Place 1 drop into both eyes daily as needed (dry eyes).   citalopram (CELEXA) 20 MG tablet Take 20 mg by mouth daily.   fluticasone (FLOVENT HFA) 220 MCG/ACT inhaler Inhale into the lungs.   hydrochlorothiazide (HYDRODIURIL) 25 MG tablet Take 25 mg by mouth daily.   loratadine (CLARITIN) 10 MG tablet Take 10 mg by mouth daily as needed for allergies.   losartan (COZAAR) 100 MG tablet Take 100 mg by mouth every evening.    Multiple Vitamin (MULTIVITAMIN) tablet Take 1 tablet by mouth daily.   omeprazole (PRILOSEC) 20 MG capsule Take 20 mg by mouth daily.   tolterodine (DETROL LA) 2 MG 24 hr capsule Take 2 mg by mouth 2 (two) times daily.      Allergies:   Imdur [isosorbide nitrate] and Sulfa antibiotics   Social History   Socioeconomic History   Marital status: Married    Spouse name: Not on file   Number of children: Not on file   Years of education: Not on file   Highest education level: Not on file  Occupational History   Not on file  Tobacco Use   Smoking status: Never   Smokeless tobacco: Never  Vaping Use   Vaping Use: Every day  Substance and Sexual Activity   Alcohol use: Yes    Comment: occasional   Drug use: No   Sexual activity: Not on file  Other Topics Concern   Not on file  Social History Narrative   Not on file   Social Determinants of Health   Financial Resource Strain: Not on file  Food Insecurity: Not on file  Transportation Needs: Not on file  Physical Activity: Not on file  Stress: Not on file  Social Connections: Not on file     Family History: The patient's family history includes Breast cancer in her maternal aunt; Heart disease in her father and mother; Hypertension in her father and mother.  ROS:   Please see the history of present illness.     All other systems reviewed and are negative.  EKGs/Labs/Other Studies Reviewed:    The  following studies were reviewed today:  cCTA 03/17/21 IMPRESSION: 1. Normal coronary calcium score of 0. Patient is low risk for coronary events. 2. Normal coronary origin with right dominance. 3. No evidence of CAD. 4. CAD-RADS 0. Consider non-atherosclerotic causes of chest pain.   Ablation 06/13/2019 CONCLUSIONS: 1. Sinus rhythm upon presentation.   2. Successful electrical isolation and anatomical encircling of all four pulmonary veins with radiofrequency current. 3. No inducible arrhythmias following ablation both on and off of dobutamine 4. No early apparent complications.   Cardiac monitoring 01/21/2019 The patient was monitored for 13 days, 23 hours. The predominant rhythm was atrial fibrillation/flutter with an average rate of 85 bpm (range 47-174 bpm). Atrial fibrillation burden was  99%. There were rare PAC's and PVC's. Two episodes of non-sustained ventricular tachycardia occurred (less likely atrial fibrillation with aberrancy), lasting up to 18 beats with a maximal rate of 174 bpm. Two post-termination pauses lasting up to 4.6 seconds were observed. Patient triggered events correspond to atrial fibrillation. Predominantly atrial fibrillation with two post-termination pauses lasting up to 4.6 seconds, as well as two episodes of likely NSVT lasting up to 18 beats.     Echo 12/18/2018  1. The left ventricle has normal systolic function of 60-65%. The cavity  size is normal. There is no increased left ventricular wall thickness.  Left ventricular diastology could not be evaluated secondary to atrial  fibrillation.   2. The right ventricle has normal systolic function. The cavity in normal  in size. There is no increase in right ventricular wall thickness.   3. Mildly dilated left atrial size.   4. The mitral valve is normal in structure There is mild thickening.   5. The aortic valve is tricuspid. There is mild thickening of the aortic  valve.   6. Normal LV systolic  function; mild TR.  EKG:  EKG is  ordered today.  The ekg ordered today demonstrates SB, 54bpm, TWI III  Recent Labs: 01/04/2021: Hemoglobin 13.2; Platelets 214 03/02/2021: BUN 13; Creatinine, Ser 0.98; Potassium 4.0; Sodium 146  Recent Lipid Panel    Component Value Date/Time   CHOL 164 12/17/2018 0507   TRIG 54 12/17/2018 0507   HDL 56 12/17/2018 0507   CHOLHDL 2.9 12/17/2018 0507   VLDL 11 12/17/2018 0507   LDLCALC 97 12/17/2018 0507     Risk Assessment/Calculations:       Physical Exam:    VS:  BP 118/60 (BP Location: Left Arm, Patient Position: Sitting, Cuff Size: Normal)    Pulse (!) 54    Ht 5\' 2"  (1.575 m)    Wt 180 lb 6 oz (81.8 kg)    SpO2 98%    BMI 32.99 kg/m     Wt Readings from Last 3 Encounters:  12/13/21 180 lb 6 oz (81.8 kg)  07/07/21 184 lb (83.5 kg)  05/02/21 185 lb (83.9 kg)     GEN:  Well nourished, well developed in no acute distress HEENT: Normal NECK: No JVD; No carotid bruits LYMPHATICS: No lymphadenopathy CARDIAC: bradycardia, RR, no murmurs, rubs, gallops RESPIRATORY:  Clear to auscultation without rales, wheezing or rhonchi  ABDOMEN: Soft, non-tender, non-distended MUSCULOSKELETAL:  No edema; No deformity  SKIN: Warm and dry NEUROLOGIC:  Alert and oriented x 3 PSYCHIATRIC:  Normal affect   ASSESSMENT:    1. Persistent atrial fibrillation (HCC)   2. Hyperlipidemia LDL goal <70   3. OSA (obstructive sleep apnea)   4. Lower extremity edema   5. Dyspnea on exertion   6. Hyperlipidemia, mixed    PLAN:    In order of problems listed above:  Paroxysmal Afib s/p ablation She reports rare palpitations since the ablation. EKG today shows SB, HR 54bpm, heart rate is baseline for her. No dizziness or lightheadedness. She is on Eliquis 5mg  BID, check CBC today. Denies bleeding issues. She has annual visit with EP coming up.   HTN BP good today, 118/60. Continue current amlodipine, HCTZ and Losartan.   HLD AST/ALT mildly elevated by labs  07/2021, I will re-check today. Continue Lovastatin 20mg  daily  LLE NO LLE today. She is on HCTZ.   OSA Diagnosed by sleep study a couple months ago. She has chronic unchanged SOB. She  wanted to try and lose weight before using CPAP. She has follow-up in April to readdress CPAP need.   Disposition: Follow up in 6 month(s) with MD/APP     Signed, Kent Braunschweig David StallH Sava Proby, PA-C  12/13/2021 11:26 AM    [facility]

## 2021-12-13 ENCOUNTER — Other Ambulatory Visit: Payer: Self-pay

## 2021-12-13 ENCOUNTER — Encounter: Payer: Self-pay | Admitting: Medical

## 2021-12-13 ENCOUNTER — Ambulatory Visit: Payer: Medicare PPO | Admitting: Medical

## 2021-12-13 VITALS — BP 118/60 | HR 54 | Ht 62.0 in | Wt 180.4 lb

## 2021-12-13 DIAGNOSIS — G4733 Obstructive sleep apnea (adult) (pediatric): Secondary | ICD-10-CM | POA: Diagnosis not present

## 2021-12-13 DIAGNOSIS — I4819 Other persistent atrial fibrillation: Secondary | ICD-10-CM | POA: Diagnosis not present

## 2021-12-13 DIAGNOSIS — R0609 Other forms of dyspnea: Secondary | ICD-10-CM

## 2021-12-13 DIAGNOSIS — E782 Mixed hyperlipidemia: Secondary | ICD-10-CM

## 2021-12-13 DIAGNOSIS — E785 Hyperlipidemia, unspecified: Secondary | ICD-10-CM | POA: Diagnosis not present

## 2021-12-13 DIAGNOSIS — R600 Localized edema: Secondary | ICD-10-CM | POA: Diagnosis not present

## 2021-12-13 NOTE — Patient Instructions (Addendum)
Medication Instructions:  Your physician recommends that you continue on your current medications as directed. Please refer to the Current Medication list given to you today.   *If you need a refill on your cardiac medications before your next appointment, please call your pharmacy*   Lab Work:  Your provider has ordered labs (LFTs, CBC). Please proceed to the Medical Mall to have these drawn.   Medical Mall Entrance at [facility] 1st desk on the right to check in, past the screening table Lab hours: Monday- Friday (7:30 am- 5:30 pm)  If you have labs (blood work) drawn today and your tests are completely normal, you will receive your results only by: MyChart Message (if you have MyChart) OR A paper copy in the mail If you have any lab test that is abnormal or we need to change your treatment, we will call you to review the results.   Testing/Procedures: None ordered   Follow-Up: At [facility], you and your health needs are our priority.  As part of our continuing mission to provide you with exceptional heart care, we have created designated Provider Care Teams.  These Care Teams include your primary Cardiologist (physician) and Advanced Practice Providers (APPs -  Physician Assistants and Nurse Practitioners) who all work together to provide you with the care you need, when you need it.  We recommend signing up for the patient portal called "MyChart".  Sign up information is provided on this After Visit Summary.  MyChart is used to connect with patients for Virtual Visits (Telemedicine).  Patients are able to view lab/test results, encounter notes, upcoming appointments, etc.  Non-urgent messages can be sent to your provider as well.   To learn more about what you can do with MyChart, go to [email or web address].    Your next appointment:   6 month(s)  The format for your next appointment:   In Person  Provider:   You may see Yvonne Kendall, MD or one of the following Advanced  Practice Providers on your designated Care Team:   Nicolasa Ducking, NP Eula Listen, PA-C Cadence Fransico Michael, PA-C1}    Other Instructions Please schedule yearly follow up with Dr. Elberta Fortis. Thanks!

## 2021-12-14 ENCOUNTER — Other Ambulatory Visit
Admission: RE | Admit: 2021-12-14 | Discharge: 2021-12-14 | Disposition: A | Discharge status: Home / Self Care | Payer: Medicare PPO | Attending: Medical | Admitting: Medical

## 2021-12-14 DIAGNOSIS — I4819 Other persistent atrial fibrillation: Secondary | ICD-10-CM | POA: Diagnosis not present

## 2021-12-14 DIAGNOSIS — E785 Hyperlipidemia, unspecified: Secondary | ICD-10-CM | POA: Insufficient documentation

## 2021-12-14 LAB — HEPATIC FUNCTION PANEL
ALT: 42 U/L (ref 0–44)
AST: 38 U/L (ref 15–41)
Albumin: 4 g/dL (ref 3.5–5.0)
Alkaline Phosphatase: 48 U/L (ref 38–126)
Bilirubin, Direct: <0.1 mg/dL (ref 0.0–0.2)
Total Bilirubin: 0.7 mg/dL (ref 0.3–1.2)
Total Protein: 7 g/dL (ref 6.5–8.1)

## 2021-12-14 LAB — CBC
HCT: 39.2 % (ref 36.0–46.0)
Hemoglobin: 13 g/dL (ref 12.0–15.0)
MCH: 32.8 pg (ref 26.0–34.0)
MCHC: 33.2 g/dL (ref 30.0–36.0)
MCV: 99 fL (ref 80.0–100.0)
Platelets: 206 10*3/uL (ref 150–400)
RBC: 3.96 MIL/uL (ref 3.87–5.11)
RDW: 13.1 % (ref 11.5–15.5)
WBC: 5 10*3/uL (ref 4.0–10.5)
nRBC: 0 % (ref 0.0–0.2)

## 2022-01-01 NOTE — Progress Notes (Signed)
Cardiology Office Note Date:  01/05/2022  Patient ID:  [name], Boeh [birth year], MRN [id number] PCP:  [name], MD  Cardiologist:  Dr. Okey Dupre Electrophysiologist: Dr. Elberta Fortis    Chief Complaint:  6 mo  History of Present Illness: Emma Rivera is a 81 y.o. female with history of HTN, DM, stroke, AFib  She comes today to be seen for Dr. Elberta Fortis, last seen by him 07/07/21, doing well, not felt AFib post ablation, no changes were made.  She saw cardiology team 12/13/21, doing well, working on diet/weight loss.  Had a sleep study w/apnea though she did not want to pursue CPAP with plans to lose weight and revisit her apnea  TODAY She is doing well Infrequently has some palpitations/Afib, minimally symptomatic and brief, she is quite happy with the current management/control of her AF post ablation No CP, SOB No near syncope or syncope. No bleeding or signs of bleeding  Sees her PMD regularly, had labs done in the fall   AFib Hx Diagnosed Feb 2020 PVI ablation 06/13/2019 AAD Hx Flecainide started April 2020, stopped June 2020 2/2 c/o dizziness and noted low HRs (and BP, BP med also stopped)   Past Medical History:  Diagnosis Date   Atrial fibrillation (HCC)    Depression    Esophageal spasm    Esophageal spasm    GERD (gastroesophageal reflux disease)    Heart murmur    HOH (hard of hearing)    Hypertension    Pre-diabetes    Stroke [facility])     Past Surgical History:  Procedure Laterality Date   ATRIAL FIBRILLATION ABLATION N/A 06/13/2019   Procedure: ATRIAL FIBRILLATION ABLATION;  Surgeon: Regan Lemming, MD;  [facility];  Service: Cardiovascular;  Laterality: N/A;   CARDIAC CATHETERIZATION     CARDIOVERSION N/A 04/01/2019   Procedure: CARDIOVERSION;  Surgeon: Lars Masson, MD;  Location: [facility] ENDOSCOPY;  Service: Cardiovascular;  Laterality: N/A;   CATARACT EXTRACTION W/PHACO Left 09/09/2015   Procedure: CATARACT EXTRACTION PHACO AND  INTRAOCULAR LENS PLACEMENT (IOC);  Surgeon: Lia Hopping, MD;  Location: [facility] ORS;  Service: Ophthalmology;  Laterality: Left;  [place]   1:10.5 AP     8.6 CDE  6.05 casette lot #  [id number]   CATARACT EXTRACTION W/PHACO Right 09/30/2015   Procedure: CATARACT EXTRACTION PHACO AND INTRAOCULAR LENS PLACEMENT (IOC);  Surgeon: Lia Hopping, MD;  Location: [facility] ORS;  Service: Ophthalmology;  Laterality: Right;  [place] 01:00.3 AP%: 11.6 CDE: 7.00 FLUID LOT# [id number]   COLONOSCOPY     COLONOSCOPY WITH PROPOFOL N/A 04/04/2017   Procedure: COLONOSCOPY WITH PROPOFOL;  Surgeon: Scot Jun, MD;  Location: [facility] ENDOSCOPY;  Service: Endoscopy;  Laterality: N/A;   TONSILLECTOMY      Current Outpatient Medications  Medication Sig Dispense Refill   acetaminophen (TYLENOL) 500 MG tablet Take 1,000 mg by mouth every 6 (six) hours as needed for moderate pain or headache.     albuterol (VENTOLIN HFA) 108 (90 Base) MCG/ACT inhaler INHALE 2 PUFFS PRIOR TO EXERCISE     amLODipine (NORVASC) 10 MG tablet Take 1 tablet (10 mg total) by mouth daily. 90 tablet 3   apixaban (ELIQUIS) 5 MG TABS tablet Take 1 tablet (5 mg total) by mouth 2 (two) times daily. 60 tablet 0   atorvastatin (LIPITOR) 20 MG tablet TAKE 1 TABLET (20 MG TOTAL) BY MOUTH DAILY AT 6 PM. 90 tablet 2   Carboxymethylcellul-Glycerin (LUBRICATING EYE DROPS OP) Place 1 drop into  both eyes daily as needed (dry eyes).     citalopram (CELEXA) 20 MG tablet Take 20 mg by mouth daily.     fluticasone (FLOVENT HFA) 220 MCG/ACT inhaler Inhale into the lungs.     hydrochlorothiazide (HYDRODIURIL) 25 MG tablet Take 25 mg by mouth daily.     loratadine (CLARITIN) 10 MG tablet Take 10 mg by mouth daily as needed for allergies.     losartan (COZAAR) 100 MG tablet Take 100 mg by mouth every evening.      Multiple Vitamin (MULTIVITAMIN) tablet Take 1 tablet by mouth daily.     omeprazole (PRILOSEC) 20 MG capsule Take 20 mg by mouth daily.     tolterodine (DETROL  LA) 2 MG 24 hr capsule Take 2 mg by mouth 2 (two) times daily.      No current facility-administered medications for this visit.    Allergies:   Imdur [isosorbide nitrate] and Sulfa antibiotics   Social History:  The patient  reports that she has never smoked. She has never used smokeless tobacco. She reports current alcohol use. She reports that she does not use drugs.   Family History:  The patient's family history includes Breast cancer in her maternal aunt; Heart disease in her father and mother; Hypertension in her father and mother.  ROS:  Please see the history of present illness.    All other systems are reviewed and otherwise negative.   PHYSICAL EXAM:  VS:  BP 138/72    Pulse 68    Ht 5\' 2"  (1.575 m)    Wt 178 lb 3.2 oz (80.8 kg)    SpO2 97%    BMI 32.59 kg/m  BMI: Body mass index is 32.59 kg/m. Well nourished, well developed, in no acute distress HEENT: normocephalic, atraumatic Neck: no JVD, carotid bruits or masses Cardiac:  RRR; no significant murmurs, no rubs, or gallops Lungs:  CTA b/l, no wheezing, rhonchi or rales Abd: soft, nontender MS: no deformity or atrophy Ext: no edema Skin: warm and dry, no rash Neuro:  No gross deficits appreciated Psych: euthymic mood, full affect   EKG:  not done today  Coronary CTA 03/17/21 IMPRESSION: 1. Normal coronary calcium score of 0. Patient is low risk for coronary events. 2. Normal coronary origin with right dominance. 3. No evidence of CAD. 4. CAD-RADS 0. Consider non-atherosclerotic causes of chest pain.   06/13/2019: EPS/Ablation CONCLUSIONS: 1. Sinus rhythm upon presentation.   2. Successful electrical isolation and anatomical encircling of all four pulmonary veins with radiofrequency current. 3. No inducible arrhythmias following ablation both on and off of dobutamine 4. No early apparent complications   Echo 12/18/2018  1. The left ventricle has normal systolic function of 60-65%. The cavity  size is  normal. There is no increased left ventricular wall thickness.  Left ventricular diastology could not be evaluated secondary to atrial  fibrillation.   2. The right ventricle has normal systolic function. The cavity in normal  in size. There is no increase in right ventricular wall thickness.   3. Mildly dilated left atrial size.   4. The mitral valve is normal in structure There is mild thickening.   5. The aortic valve is tricuspid. There is mild thickening of the aortic  valve.   6. Normal LV systolic function; mild TR.  Recent Labs: 03/02/2021: BUN 13; Creatinine, Ser 0.98; Potassium 4.0; Sodium 146 12/14/2021: ALT 42; Hemoglobin 13.0; Platelets 206  No results found for requested labs within last 8760 hours.  CrCl cannot be calculated (Patient's most recent lab result is older than the maximum 21 days allowed.).   Wt Readings from Last 3 Encounters:  01/05/22 178 lb 3.2 oz (80.8 kg)  12/13/21 180 lb 6 oz (81.8 kg)  07/07/21 184 lb (83.5 kg)     Other studies reviewed: Additional studies/records reviewed today include: summarized above  ASSESSMENT AND PLAN:  Paroxysmal Afib CHA2DS2Vasc is 7, on Eliquis,  appropriately dosed minimal burden by symptoms  2. HTN No change today  3. Sleep apnea She is going to start with weight loss strategy We discussed importance of apnea treatment, weight loss strategies  Disposition: F/u with EP  in a year, sooner if needed  Current medicines are reviewed at length with the patient today.  The patient did not have any concerns regarding medicines.  Norma Fredrickson, PA-C 01/05/2022 10:22 AM     [facility] [address] [place] [address] [phone] (office)  [phone] (fax)

## 2022-01-05 ENCOUNTER — Ambulatory Visit: Payer: Medicare PPO | Admitting: Physician Assistant

## 2022-01-05 ENCOUNTER — Other Ambulatory Visit: Payer: Self-pay

## 2022-01-05 ENCOUNTER — Encounter: Payer: Self-pay | Admitting: Physician Assistant

## 2022-01-05 VITALS — BP 138/72 | HR 68 | Ht 62.0 in | Wt 178.2 lb

## 2022-01-05 DIAGNOSIS — I10 Essential (primary) hypertension: Secondary | ICD-10-CM

## 2022-01-05 DIAGNOSIS — I480 Paroxysmal atrial fibrillation: Secondary | ICD-10-CM | POA: Diagnosis not present

## 2022-01-05 NOTE — Patient Instructions (Signed)

## 2022-05-04 ENCOUNTER — Other Ambulatory Visit
Admission: RE | Admit: 2022-05-04 | Discharge: 2022-05-04 | Disposition: A | Discharge status: Home / Self Care | Payer: Medicare PPO | Source: Ambulatory Visit | Attending: Pulmonary Disease | Admitting: Pulmonary Disease

## 2022-05-04 DIAGNOSIS — R0602 Shortness of breath: Secondary | ICD-10-CM | POA: Diagnosis present

## 2022-05-04 DIAGNOSIS — J4540 Moderate persistent asthma, uncomplicated: Secondary | ICD-10-CM | POA: Diagnosis present

## 2022-05-04 LAB — D-DIMER, QUANTITATIVE: D-Dimer, Quant: 0.33 ug/mL-FEU (ref 0.00–0.50)

## 2022-06-12 ENCOUNTER — Encounter: Payer: Self-pay | Admitting: Medical

## 2022-06-12 ENCOUNTER — Ambulatory Visit: Payer: Medicare PPO | Admitting: Medical

## 2022-06-12 VITALS — BP 130/64 | HR 65 | Ht 62.0 in | Wt 178.0 lb

## 2022-06-12 DIAGNOSIS — R600 Localized edema: Secondary | ICD-10-CM

## 2022-06-12 DIAGNOSIS — E782 Mixed hyperlipidemia: Secondary | ICD-10-CM

## 2022-06-12 DIAGNOSIS — I480 Paroxysmal atrial fibrillation: Secondary | ICD-10-CM

## 2022-06-12 DIAGNOSIS — G4733 Obstructive sleep apnea (adult) (pediatric): Secondary | ICD-10-CM

## 2022-06-12 DIAGNOSIS — I10 Essential (primary) hypertension: Secondary | ICD-10-CM

## 2022-06-12 NOTE — Progress Notes (Signed)
Cardiology Office Note:    Date:  06/12/2022   ID:  GERA INBODEN, DOB 22-Jan-1941, MRN [id number]  PCP:  [name], MD  [facility] Cardiologist:  Yvonne Kendall, MD  [facility] Electrophysiologist:  Will Jorja Loa, MD   Referring MD: [name], MD   Chief Complaint: 6 month follow-up  History of Present Illness:    Emma Rivera is a 81 y.o. female with a hx of stroke (12/2018) and subsequent diagnosis of paroxysmal atrial fibrillation s/p ablation 05/2019, COVID-19 11/2020, allergies, hypertension, GERD, esophageal spasm who presents for 6 month follow-up.    She was seen by Dr. Elberta Fortis 12/2019 to follow-up on her atrial fibrillation s/p catheter ablation.  She noted intermittent exertional chest pain, improved with rest.  MPI 07/2019 low risk without evidence of ischemia or scar.  Prior cCTA in anticipation of ablation 05/2019 without significant CAD.     She was last seen in office 03/02/2021.  She reported regular episodes of CP and pressure when walking more than a slow pace or going up an incline. Given her exertional symptoms, recommendation was to obtain coronary CTA for further evaluation. She obtained a coronary CTA that showed a normal coronary calcium score of 0.  No evidence of CAD was noted.  She had a sleep study and was diagnosed with OSA, but didn't want to pursue CPAP machine. She wanted to pursue weight los.    Last seen by EP 01/05/22 and was overall doing well.   Today, the patient reports she has been doing well since the last follow-up. The patient is not using CPAP machine. No chest pain. She has occasional DOE, this is the same and unchanged. No orthopnea, pnd. She has dependent edema. She has been eating more salt.   Past Medical History:  Diagnosis Date   Atrial fibrillation (HCC)    Depression    Esophageal spasm    Esophageal spasm    GERD (gastroesophageal reflux disease)    Heart murmur    HOH (hard of hearing)    Hypertension     Pre-diabetes    Stroke [facility])     Past Surgical History:  Procedure Laterality Date   ATRIAL FIBRILLATION ABLATION N/A 06/13/2019   Procedure: ATRIAL FIBRILLATION ABLATION;  Surgeon: Regan Lemming, MD;  Location: [facility] INVASIVE CV LAB;  Service: Cardiovascular;  Laterality: N/A;   CARDIAC CATHETERIZATION     CARDIOVERSION N/A 04/01/2019   Procedure: CARDIOVERSION;  Surgeon: Lars Masson, MD;  Location: [facility] ENDOSCOPY;  Service: Cardiovascular;  Laterality: N/A;   CATARACT EXTRACTION W/PHACO Left 09/09/2015   Procedure: CATARACT EXTRACTION PHACO AND INTRAOCULAR LENS PLACEMENT (IOC);  Surgeon: Lia Hopping, MD;  Location: [facility] ORS;  Service: Ophthalmology;  Laterality: Left;  [place]   1:10.5 AP     8.6 CDE  6.05 casette lot #  [id number]   CATARACT EXTRACTION W/PHACO Right 09/30/2015   Procedure: CATARACT EXTRACTION PHACO AND INTRAOCULAR LENS PLACEMENT (IOC);  Surgeon: Lia Hopping, MD;  Location: [facility] ORS;  Service: Ophthalmology;  Laterality: Right;  [place] 01:00.3 AP%: 11.6 CDE: 7.00 FLUID LOT# [id number]   COLONOSCOPY     COLONOSCOPY WITH PROPOFOL N/A 04/04/2017   Procedure: COLONOSCOPY WITH PROPOFOL;  Surgeon: Scot Jun, MD;  Location: [facility] ENDOSCOPY;  Service: Endoscopy;  Laterality: N/A;   TONSILLECTOMY      Current Medications: Current Meds  Medication Sig   acetaminophen (TYLENOL) 500 MG tablet Take 1,000 mg by mouth every 6 (six) hours as needed for  moderate pain or headache.   albuterol (VENTOLIN HFA) 108 (90 Base) MCG/ACT inhaler INHALE 2 PUFFS PRIOR TO EXERCISE   amLODipine (NORVASC) 10 MG tablet Take 1 tablet (10 mg total) by mouth daily.   apixaban (ELIQUIS) 5 MG TABS tablet Take 1 tablet (5 mg total) by mouth 2 (two) times daily.   atorvastatin (LIPITOR) 20 MG tablet TAKE 1 TABLET (20 MG TOTAL) BY MOUTH DAILY AT 6 PM.   Carboxymethylcellul-Glycerin (LUBRICATING EYE DROPS OP) Place 1 drop into both eyes daily as needed (dry eyes).   citalopram (CELEXA) 20 MG  tablet Take 20 mg by mouth daily.   hydrochlorothiazide (HYDRODIURIL) 25 MG tablet Take 25 mg by mouth daily.   loratadine (CLARITIN) 10 MG tablet Take 10 mg by mouth daily as needed for allergies.   losartan (COZAAR) 100 MG tablet Take 100 mg by mouth every evening.    Multiple Vitamin (MULTIVITAMIN) tablet Take 1 tablet by mouth daily.   omeprazole (PRILOSEC) 20 MG capsule Take 20 mg by mouth daily.   tolterodine (DETROL LA) 2 MG 24 hr capsule Take 2 mg by mouth 2 (two) times daily.      Allergies:   Imdur [isosorbide nitrate] and Sulfa antibiotics   Social History   Socioeconomic History   Marital status: Married    Spouse name: Not on file   Number of children: Not on file   Years of education: Not on file   Highest education level: Not on file  Occupational History   Not on file  Tobacco Use   Smoking status: Never   Smokeless tobacco: Never  Vaping Use   Vaping Use: Every day  Substance and Sexual Activity   Alcohol use: Yes    Alcohol/week: 1.0 standard drink of alcohol    Types: 1 Glasses of wine per week    Comment: occasional   Drug use: No   Sexual activity: Not on file  Other Topics Concern   Not on file  Social History Narrative   Not on file   Social Determinants of Health   Financial Resource Strain: Not on file  Food Insecurity: Not on file  Transportation Needs: Not on file  Physical Activity: Not on file  Stress: Not on file  Social Connections: Not on file     Family History: The patient's family history includes Breast cancer in her maternal aunt; Heart disease in her father and mother; Hypertension in her father and mother.  ROS:   Please see the history of present illness.     All other systems reviewed and are negative.  EKGs/Labs/Other Studies Reviewed:    The following studies were reviewed today:  cCTA 03/17/21 IMPRESSION: 1. Normal coronary calcium score of 0. Patient is low risk for coronary events. 2. Normal coronary origin  with right dominance. 3. No evidence of CAD. 4. CAD-RADS 0. Consider non-atherosclerotic causes of chest pain.   Ablation 06/13/2019 CONCLUSIONS: 1. Sinus rhythm upon presentation.   2. Successful electrical isolation and anatomical encircling of all four pulmonary veins with radiofrequency current. 3. No inducible arrhythmias following ablation both on and off of dobutamine 4. No Rivera apparent complications.   Cardiac monitoring 01/21/2019 The patient was monitored for 13 days, 23 hours. The predominant rhythm was atrial fibrillation/flutter with an average rate of 85 bpm (range 47-174 bpm). Atrial fibrillation burden was 99%. There were rare PAC's and PVC's. Two episodes of non-sustained ventricular tachycardia occurred (less likely atrial fibrillation with aberrancy), lasting up to 18 beats  with a maximal rate of 174 bpm. Two post-termination pauses lasting up to 4.6 seconds were observed. Patient triggered events correspond to atrial fibrillation. Predominantly atrial fibrillation with two post-termination pauses lasting up to 4.6 seconds, as well as two episodes of likely NSVT lasting up to 18 beats.     Echo 12/18/2018  1. The left ventricle has normal systolic function of 60-65%. The cavity  size is normal. There is no increased left ventricular wall thickness.  Left ventricular diastology could not be evaluated secondary to atrial  fibrillation.   2. The right ventricle has normal systolic function. The cavity in normal  in size. There is no increase in right ventricular wall thickness.   3. Mildly dilated left atrial size.   4. The mitral valve is normal in structure There is mild thickening.   5. The aortic valve is tricuspid. There is mild thickening of the aortic  valve.   6. Normal LV systolic function; mild TR.  EKG:  EKG is ordered today.  The ekg ordered today demonstrates NSR 65bpm, TWI III, no new changes  Recent Labs: 12/14/2021: ALT 42; Hemoglobin 13.0;  Platelets 206  Recent Lipid Panel    Component Value Date/Time   CHOL 164 12/17/2018 0507   TRIG 54 12/17/2018 0507   HDL 56 12/17/2018 0507   CHOLHDL 2.9 12/17/2018 0507   VLDL 11 12/17/2018 0507   LDLCALC 97 12/17/2018 0507     Physical Exam:    VS:  BP 130/64 (BP Location: Left Arm, Patient Position: Sitting, Cuff Size: Large)   Pulse 65   Ht 5\' 2"  (1.575 m)   Wt 178 lb (80.7 kg)   SpO2 98%   BMI 32.56 kg/m     Wt Readings from Last 3 Encounters:  06/12/22 178 lb (80.7 kg)  01/05/22 178 lb 3.2 oz (80.8 kg)  12/13/21 180 lb 6 oz (81.8 kg)     GEN:  Well nourished, well developed in no acute distress HEENT: Normal NECK: No JVD; No carotid bruits LYMPHATICS: No lymphadenopathy CARDIAC: RRR, no murmurs, rubs, gallops RESPIRATORY:  Clear to auscultation without rales, wheezing or rhonchi  ABDOMEN: Soft, non-tender, non-distended MUSCULOSKELETAL:  No edema; No deformity  SKIN: Warm and dry NEUROLOGIC:  Alert and oriented x 3 PSYCHIATRIC:  Normal affect   ASSESSMENT:    1. Paroxysmal atrial fibrillation (HCC)   2. Essential hypertension   3. Hyperlipidemia, mixed   4. Lower extremity edema   5. OSA (obstructive sleep apnea)    PLAN:    In order of problems listed above:  Paroxysmal Afib EKG today shows NSR with heart rate 65bpm, not on rate lowering medications. CHADSVASC of 7. Continue Eliquis 5mg BID for stroke ppx.   HTN BP is good today, continue current medications.   HLD LDL 68. Continue Lipitor 20mg  daily.   LLE This is dependent edema, has been more as she has been eating more salt lately. Continue HCTZ 25mg  daily.  OSA This was borderline in need of CPAP, she elected not to use CPAP and work on lifestyle changes.   Disposition: Follow up in 6 month(s) with MD/APP    Signed, Judyann Casasola 12/15/21, PA-C  06/12/2022 9:24 AM    [facility]

## 2022-06-12 NOTE — Patient Instructions (Signed)
Medication Instructions:  Your physician recommends that you continue on your current medications as directed. Please refer to the Current Medication list given to you today.  *If you need a refill on your cardiac medications before your next appointment, please call your pharmacy*   Lab Work: None ordered If you have labs (blood work) drawn today and your tests are completely normal, you will receive your results only by: MyChart Message (if you have MyChart) OR A paper copy in the mail If you have any lab test that is abnormal or we need to change your treatment, we will call you to review the results.   Testing/Procedures: None ordered   Follow-Up: At [facility], you and your health needs are our priority.  As part of our continuing mission to provide you with exceptional heart care, we have created designated Provider Care Teams.  These Care Teams include your primary Cardiologist (physician) and Advanced Practice Providers (APPs -  Physician Assistants and Nurse Practitioners) who all work together to provide you with the care you need, when you need it.  We recommend signing up for the patient portal called "MyChart".  Sign up information is provided on this After Visit Summary.  MyChart is used to connect with patients for Virtual Visits (Telemedicine).  Patients are able to view lab/test results, encounter notes, upcoming appointments, etc.  Non-urgent messages can be sent to your provider as well.   To learn more about what you can do with MyChart, go to [email or web address].    Your next appointment:   Your physician wants you to follow-up in: 6 months You will receive a reminder letter in the mail two months in advance. If you don't receive a letter, please call our office to schedule the follow-up appointment.   The format for your next appointment:   In Person  Provider:   You may see Christopher End, MD or one of the following Advanced Practice Providers on your  designated Care Team:   Christopher Berge, NP Ryan Dunn, PA-C Cadence Furth, PA-C   Other Instructions N/A  Important Information About Sugar       

## 2022-06-14 NOTE — Addendum Note (Signed)
Addended by: Enedina Finner on: 06/14/2022 07:47 AM   Modules accepted: Orders

## 2022-09-10 ENCOUNTER — Other Ambulatory Visit: Payer: Self-pay | Admitting: Cardiology

## 2022-11-27 ENCOUNTER — Other Ambulatory Visit: Payer: Self-pay | Admitting: Medical

## 2023-01-04 ENCOUNTER — Encounter: Payer: Self-pay | Admitting: Internal Medicine

## 2023-01-04 ENCOUNTER — Ambulatory Visit: Payer: Medicare PPO | Attending: Internal Medicine | Admitting: Internal Medicine

## 2023-01-04 VITALS — BP 138/70 | HR 59 | Ht 62.0 in | Wt 183.0 lb

## 2023-01-04 DIAGNOSIS — Z8673 Personal history of transient ischemic attack (TIA), and cerebral infarction without residual deficits: Secondary | ICD-10-CM | POA: Diagnosis not present

## 2023-01-04 DIAGNOSIS — I480 Paroxysmal atrial fibrillation: Secondary | ICD-10-CM | POA: Diagnosis not present

## 2023-01-04 DIAGNOSIS — I10 Essential (primary) hypertension: Secondary | ICD-10-CM | POA: Diagnosis not present

## 2023-01-04 NOTE — Progress Notes (Signed)
Follow-up Outpatient Visit Date: 01/04/2023  Primary Care Provider: Dion Body, MD [facility] [place] [address]  Chief Complaint: Follow-up paroxysmal atrial fibrillation  HPI:  Emma Rivera is a 82 y.o. female with history of stroke (12/2018) and subsequent diagnosis of paroxysmal atrial fibrillation status post ablation, hypertension, GERD, and esophageal spasm, who presents for follow-up of chest pain and atrial fibrillation.  She was last seen in our office in 05/2022 by Cadence Furth, PA, at which time she was doing well other than occasional dyspnea on exertion that was stable from prior visits.  Today, Emma Rivera reports that she feels fairly well.  She has stable exertional dyspnea when going up stairs but is otherwise asymptomatic without chest pain or dyspnea at rest.  He has rare palpitations described as flutters that only last a few minutes at a time.  There are no associated symptoms.  She has not had any lightheadedness or edema.  She also denies bleeding.  Home blood pressures are typically around 99991111, though systolic readings occasionally drift above 140 mmHg.  Her mobility has been somewhat limited due to left knee pain over the last couple of months.  She is scheduled for orthopedics evaluation next week.  --------------------------------------------------------------------------------------------------  Past Medical History:  Diagnosis Date   Atrial fibrillation ([place])    Depression    Esophageal spasm    Esophageal spasm    GERD (gastroesophageal reflux disease)    Heart murmur    HOH (hard of hearing)    Hypertension    Pre-diabetes    Stroke [facility])    Past Surgical History:  Procedure Laterality Date   ATRIAL FIBRILLATION ABLATION N/A 06/13/2019   Procedure: ATRIAL FIBRILLATION ABLATION;  Surgeon: Constance Haw, MD;  Location: [place] CV LAB;  Service: Cardiovascular;  Laterality: N/A;   CARDIAC CATHETERIZATION      CARDIOVERSION N/A 04/01/2019   Procedure: CARDIOVERSION;  Surgeon: Dorothy Spark, MD;  Location: [facility] ENDOSCOPY;  Service: Cardiovascular;  Laterality: N/A;   CATARACT EXTRACTION W/PHACO Left 09/09/2015   Procedure: CATARACT EXTRACTION PHACO AND INTRAOCULAR LENS PLACEMENT ([place]);  Surgeon: Lyla Glassing, MD;  Location: [facility] ORS;  Service: Ophthalmology;  Laterality: Left;  [place]   1:10.5 AP     8.6 CDE  6.05 casette lot #  [id number]   CATARACT EXTRACTION W/PHACO Right 09/30/2015   Procedure: CATARACT EXTRACTION PHACO AND INTRAOCULAR LENS PLACEMENT (IOC);  Surgeon: Lyla Glassing, MD;  Location: [facility] ORS;  Service: Ophthalmology;  Laterality: Right;  [place] 01:00.3 AP%: 11.6 CDE: 7.00 FLUID LOT# [id number]   COLONOSCOPY     COLONOSCOPY WITH PROPOFOL N/A 04/04/2017   Procedure: COLONOSCOPY WITH PROPOFOL;  Surgeon: Manya Silvas, MD;  Location: [facility] ENDOSCOPY;  Service: Endoscopy;  Laterality: N/A;   TONSILLECTOMY      Current Meds  Medication Sig   acetaminophen (TYLENOL) 500 MG tablet Take 1,000 mg by mouth every 6 (six) hours as needed for moderate pain or headache.   albuterol (VENTOLIN HFA) 108 (90 Base) MCG/ACT inhaler INHALE 2 PUFFS PRIOR TO EXERCISE   amLODipine (NORVASC) 10 MG tablet Take 1 tablet (10 mg total) by mouth daily. Please schedule appointment for further refills. 1st attempt.   apixaban (ELIQUIS) 5 MG TABS tablet Take 1 tablet (5 mg total) by mouth 2 (two) times daily.   atorvastatin (LIPITOR) 20 MG tablet TAKE 1 TABLET (20 MG TOTAL) BY MOUTH DAILY AT 6 PM.   Carboxymethylcellul-Glycerin (LUBRICATING EYE DROPS OP) Place 1  drop into both eyes daily as needed (dry eyes).   citalopram (CELEXA) 20 MG tablet Take 20 mg by mouth daily.   Fluticasone Furoate (ARNUITY ELLIPTA) 100 MCG/ACT AEPB Inhale 1 puff into the lungs daily.   hydrochlorothiazide (HYDRODIURIL) 25 MG tablet Take 25 mg by mouth daily.   loratadine (CLARITIN) 10 MG tablet Take 10 mg by mouth daily as needed  for allergies.   losartan (COZAAR) 100 MG tablet Take 100 mg by mouth every evening.    Multiple Vitamin (MULTIVITAMIN) tablet Take 1 tablet by mouth daily.   omeprazole (PRILOSEC) 20 MG capsule Take 20 mg by mouth daily.   tolterodine (DETROL LA) 2 MG 24 hr capsule Take 2 mg by mouth 2 (two) times daily.     Allergies: Imdur [isosorbide nitrate] and Sulfa antibiotics  Social History   Tobacco Use   Smoking status: Never   Smokeless tobacco: Never  Vaping Use   Vaping Use: Never used  Substance Use Topics   Alcohol use: Yes    Alcohol/week: 1.0 standard drink of alcohol    Types: 1 Glasses of wine per week    Comment: occasional   Drug use: No    Family History  Problem Relation Age of Onset   Breast cancer Maternal Aunt    Hypertension Mother    Heart disease Mother    Heart disease Father    Hypertension Father     Review of Systems: A 12-system review of systems was performed and was negative except as noted in the HPI.  --------------------------------------------------------------------------------------------------  Physical Exam: BP (!) 140/68 (BP Location: Left Arm, Patient Position: Sitting, Cuff Size: Large)   Pulse (!) 59   Ht 5' 2"$  (1.575 m)   Wt 183 lb (83 kg)   SpO2 98%   BMI 33.47 kg/m  Repeat BP: 138/70  General:  NAD. Neck: No JVD or HJR. Lungs: Clear to auscultation bilaterally without wheezes or crackles. Heart: Bradycardic but regular without murmurs, rubs, or gallops. Abdomen: Soft, nontender, nondistended. Extremities: No lower extremity edema.  EKG: Sinus bradycardia.  Otherwise, no significant abnormality.  Lab Results  Component Value Date   WBC 5.0 12/14/2021   HGB 13.0 12/14/2021   HCT 39.2 12/14/2021   MCV 99.0 12/14/2021   PLT 206 12/14/2021    Lab Results  Component Value Date   NA 146 (H) 03/02/2021   K 4.0 03/02/2021   CL 104 03/02/2021   CO2 28 03/02/2021   BUN 13 03/02/2021   CREATININE 0.98 03/02/2021    GLUCOSE 104 (H) 03/02/2021   ALT 42 12/14/2021    Lab Results  Component Value Date   CHOL 164 12/17/2018   HDL 56 12/17/2018   LDLCALC 97 12/17/2018   TRIG 54 12/17/2018   CHOLHDL 2.9 12/17/2018    --------------------------------------------------------------------------------------------------  ASSESSMENT AND PLAN: Paroxysmal atrial fibrillation and history of stroke: Emma Rivera continues to do well with only rare, self-limited palpitations.  Continue indefinite anticoagulation with apixaban.  Continue atorvastatin 20 mg daily; lipids well-controlled on last check through Dr. Raylene Miyamoto office.  Hypertension: Blood pressure borderline elevated today but typically a little better continue current regimen hydrochlorothiazide, losartan.  Sodium restriction and increased activity, as tolerated, were also encouraged.  Follow-up: Return to clinic in 1 year.  Nelva Bush, MD 01/04/2023 4:31 PM

## 2023-01-04 NOTE — Patient Instructions (Signed)
Medication Instructions:  Your Physician recommend you continue on your current medication as directed.    *If you need a refill on your cardiac medications before your next appointment, please call your pharmacy*   Lab Work: None ordered today   Testing/Procedures: None ordered today   Follow-Up: At [facility], you and your health needs are our priority.  As part of our continuing mission to provide you with exceptional heart care, we have created designated Provider Care Teams.  These Care Teams include your primary Cardiologist (physician) and Advanced Practice Providers (APPs -  Physician Assistants and Nurse Practitioners) who all work together to provide you with the care you need, when you need it.  We recommend signing up for the patient portal called "MyChart".  Sign up information is provided on this After Visit Summary.  MyChart is used to connect with patients for Virtual Visits (Telemedicine).  Patients are able to view lab/test results, encounter notes, upcoming appointments, etc.  Non-urgent messages can be sent to your provider as well.   To learn more about what you can do with MyChart, go to [email or web address].    Your next appointment:   1 year(s)  Provider:   You may see Nelva Bush, MD or one of the following Advanced Practice Providers on your designated Care Team:   Murray Hodgkins, NP Christell Faith, PA-C Cadence Kathlen Mody, PA-C Gerrie Nordmann, NP

## 2023-02-22 ENCOUNTER — Telehealth: Payer: Self-pay | Admitting: Cardiology

## 2023-02-22 NOTE — Telephone Encounter (Signed)
Patient c/o Palpitations:  High priority if patient c/o lightheadedness, shortness of breath, or chest pain  How long have you had palpitations/irregular HR/ Afib? Are you having the symptoms now? She states that she knows of, its been going on since Thursday of last week. Pt states she is having symptoms now.   Are you currently experiencing lightheadedness, SOB or CP? States she feels a little lightheaded, just not her normal self. Hasn't passed out. Has a little feeling of nausea, but has not vomited.   Do you have a history of afib (atrial fibrillation) or irregular heart rhythm? Yes, already diagnosed with A-Fib.  Have you checked your BP or HR? (document readings if available): She states she hasn't checked it today, but when she checked her bp yesterday it was fine. But HR is elevated.   Are you experiencing any other symptoms? No   Call transferred to triage.

## 2023-02-22 NOTE — Telephone Encounter (Signed)
Patient stated she has been in AFIB since last Thursday. This morning her heart rate was at 118 per her cardio. She denies having any chest pain, shortness of breath edema or palpitations currently. She states she does feel lightheaded. She took her blood pressure and the reading was 131/87 with heart rate showing 78. She states there is a difference from her blood pressure monitor and her cardio. Her fit bit also states her heart rate was high.  When she woke up this morning her lips felt numb. She does not want to go to ED. She is taking her eliquis as prescribed.   I did advise to continue to monitor her heart rate. If she start to have symptoms to go the ED. Her husband is at home with her. She has an appointment with Dr. Orlie Pollen on Tuesday April 16th at 1215pm for follow up. Will forward to provider for further advise.

## 2023-02-27 ENCOUNTER — Encounter: Payer: Self-pay | Admitting: Cardiology

## 2023-02-27 ENCOUNTER — Ambulatory Visit: Payer: Medicare PPO | Attending: Cardiology | Admitting: Cardiology

## 2023-02-27 VITALS — BP 120/64 | HR 96 | Ht 62.0 in | Wt 179.0 lb

## 2023-02-27 DIAGNOSIS — D6869 Other thrombophilia: Secondary | ICD-10-CM

## 2023-02-27 DIAGNOSIS — I10 Essential (primary) hypertension: Secondary | ICD-10-CM | POA: Diagnosis not present

## 2023-02-27 DIAGNOSIS — I4819 Other persistent atrial fibrillation: Secondary | ICD-10-CM

## 2023-02-27 MED ORDER — FLECAINIDE ACETATE 50 MG PO TABS: 50.0000 mg | ORAL_TABLET | Freq: Two times a day (BID) | ORAL | 3 refills | Status: DC

## 2023-02-27 MED ORDER — DILTIAZEM HCL ER COATED BEADS 120 MG PO CP24: 120.0000 mg | ORAL_CAPSULE | Freq: Every day | ORAL | 3 refills | Status: DC

## 2023-02-27 MED ORDER — FLECAINIDE ACETATE 100 MG PO TABS: 100.0000 mg | ORAL_TABLET | Freq: Two times a day (BID) | ORAL | 3 refills | Status: DC

## 2023-02-27 NOTE — Progress Notes (Addendum)
Electrophysiology Office Note   Date:  02/27/2023   ID:  Emma Rivera, DOB Oct 25, 1941, MRN [id number]  PCP:  [name], MD  Cardiologist:  End Primary Electrophysiologist:  Shirly Bartosiewicz Jorja Loa, MD    Chief Complaint: AF   History of Present Illness: Emma Rivera is a 82 y.o. female who is being seen today for the evaluation of AF at the request of [name], MD. Presenting today for electrophysiology evaluation.  She has a history significant for persistent atrial fibrillation, hypertension, diabetes, CVA.  She is post atrial fibrillation ablation 06/13/2019.  Unfortunately she presents today in atrial fibrillation.  She thinks she is atrial fibrillation for the last 2 weeks.  She feels weak, fatigued, short of breath and is having trouble doing her daily activities.  She would prefer a rhythm control strategy.  Today, denies symptoms of palpitations, chest pain, PND, lower extremity edema, claudication, dizziness, presyncope, syncope, bleeding, or neurologic sequela. The patient is tolerating medications without difficulties.    Past Medical History:  Diagnosis Date   Atrial fibrillation    Depression    Esophageal spasm    Esophageal spasm    GERD (gastroesophageal reflux disease)    Heart murmur    HOH (hard of hearing)    Hypertension    Pre-diabetes    Stroke    Past Surgical History:  Procedure Laterality Date   ATRIAL FIBRILLATION ABLATION N/A 06/13/2019   Procedure: ATRIAL FIBRILLATION ABLATION;  Surgeon: Regan Lemming, MD;  Location: [facility] INVASIVE CV LAB;  Service: Cardiovascular;  Laterality: N/A;   CARDIAC CATHETERIZATION     CARDIOVERSION N/A 04/01/2019   Procedure: CARDIOVERSION;  Surgeon: Lars Masson, MD;  [facility] ENDOSCOPY;  Service: Cardiovascular;  Laterality: N/A;   CATARACT EXTRACTION W/PHACO Left 09/09/2015   Procedure: CATARACT EXTRACTION PHACO AND INTRAOCULAR LENS PLACEMENT ([place]);  Surgeon: Lia Hopping, MD;   Location: [facility] ORS;  Service: Ophthalmology;  Laterality: Left;  [place]   1:10.5 AP     8.6 CDE  6.05 casette lot #  [id number]   CATARACT EXTRACTION W/PHACO Right 09/30/2015   Procedure: CATARACT EXTRACTION PHACO AND INTRAOCULAR LENS PLACEMENT ([facility]);  Surgeon: Lia Hopping, MD;  Location: [facility] ORS;  Service: Ophthalmology;  Laterality: Right;  [place] 01:00.3 AP%: 11.6 CDE: 7.00 FLUID LOT# [id number]   COLONOSCOPY     COLONOSCOPY WITH PROPOFOL N/A 04/04/2017   Procedure: COLONOSCOPY WITH PROPOFOL;  Surgeon: Scot Jun, MD;  Location: [facility] ENDOSCOPY;  Service: Endoscopy;  Laterality: N/A;   TONSILLECTOMY       Current Outpatient Medications  Medication Sig Dispense Refill   acetaminophen (TYLENOL) 500 MG tablet Take 1,000 mg by mouth every 6 (six) hours as needed for moderate pain or headache.     albuterol (VENTOLIN HFA) 108 (90 Base) MCG/ACT inhaler INHALE 2 PUFFS PRIOR TO EXERCISE     amLODipine (NORVASC) 10 MG tablet Take 1 tablet (10 mg total) by mouth daily. Please schedule appointment for further refills. 1st attempt. 30 tablet 1   apixaban (ELIQUIS) 5 MG TABS tablet Take 1 tablet (5 mg total) by mouth 2 (two) times daily. 60 tablet 0   atorvastatin (LIPITOR) 20 MG tablet TAKE 1 TABLET (20 MG TOTAL) BY MOUTH DAILY AT 6 PM. 90 tablet 2   Carboxymethylcellul-Glycerin (LUBRICATING EYE DROPS OP) Place 1 drop into both eyes daily as needed (dry eyes).     citalopram (CELEXA) 20 MG tablet Take 20 mg by mouth daily.  diltiazem (CARDIZEM CD) 120 MG 24 hr capsule Take 1 capsule (120 mg total) by mouth daily. 30 capsule 3   flecainide (TAMBOCOR) 100 MG tablet Take 1 tablet (100 mg total) by mouth 2 (two) times daily. 60 tablet 3   Fluticasone Furoate (ARNUITY ELLIPTA) 100 MCG/ACT AEPB Inhale 1 puff into the lungs daily.     hydrochlorothiazide (HYDRODIURIL) 25 MG tablet Take 25 mg by mouth daily.     loratadine (CLARITIN) 10 MG tablet Take 10 mg by mouth daily as needed for allergies.      losartan (COZAAR) 100 MG tablet Take 100 mg by mouth every evening.      Multiple Vitamin (MULTIVITAMIN) tablet Take 1 tablet by mouth daily.     omeprazole (PRILOSEC) 20 MG capsule Take 20 mg by mouth daily.     tolterodine (DETROL LA) 2 MG 24 hr capsule Take 2 mg by mouth 2 (two) times daily.      No current facility-administered medications for this visit.    Allergies:   Imdur [isosorbide nitrate] and Sulfa antibiotics   Social History:  The patient  reports that she has never smoked. She has never used smokeless tobacco. She reports current alcohol use of about 1.0 standard drink of alcohol per week. She reports that she does not use drugs.   Family History:  The patient's family history includes Breast cancer in her maternal aunt; Heart disease in her father and mother; Hypertension in her father and mother.   ROS:  Please see the history of present illness.   Otherwise, review of systems is positive for none.   All other systems are reviewed and negative.   PHYSICAL EXAM: VS:  BP 120/64   Pulse 96   Ht  (1.575 m)   Wt 179 lb (81.2 kg)   SpO2 99%   BMI 32.74 kg/m  , BMI Body mass index is 32.74 kg/m. GEN: Well nourished, well developed, in no acute distress  HEENT: normal  Neck: no JVD, carotid bruits, or masses Cardiac: irregular; no murmurs, rubs, or gallops,no edema  Respiratory:  clear to auscultation bilaterally, normal work of breathing GI: soft, nontender, nondistended, + BS MS: no deformity or atrophy  Skin: warm and dry Neuro:  Strength and sensation are intact Psych: euthymic mood, full affect  EKG:  ECG is ordered today. Personal review of the ekg ordered shows atrial fibrillation   Recent Labs: No results found for requested labs within last 365 days.    Lipid Panel     Component Value Date/Time   CHOL 164 12/17/2018 0507   TRIG 54 12/17/2018 0507   HDL 56 12/17/2018 0507   CHOLHDL 2.9 12/17/2018 0507   VLDL 11 12/17/2018 0507   LDLCALC 97  12/17/2018 0507     Wt Readings from Last 3 Encounters:  02/27/23 179 lb (81.2 kg)  01/04/23 183 lb (83 kg)  06/12/22 178 lb (80.7 kg)    For the Eliquis  Other studies Reviewed: Additional studies/ records that were reviewed today include: TTE 12/18/18  Review of the above records today demonstrates:   1. The left ventricle has normal systolic function of 60-65%. The cavity size is normal. There is no increased left ventricular wall thickness. Left ventricular diastology could not be evaluated secondary to atrial fibrillation.  2. The right ventricle has normal systolic function. The cavity in normal in size. There is no increase in right ventricular wall thickness.  3. Mildly dilated left atrial size.  4.  The mitral valve is normal in structure There is mild thickening.  5. The aortic valve is tricuspid. There is mild thickening of the aortic valve.  6. Normal LV systolic function; mild TR.   ASSESSMENT AND PLAN:  1.  Persistent atrial fibrillation: Status post ablation 06/13/2019.  Currently on Eliquis with a CHA2DS2-VASc of 6.  She is in atrial fibrillation today.  She feels weak, fatigued, short of breath.  She going to get back into normal rhythm.  She would like to avoid long-term medications.  Due to that, we Emya Picado plan for ablation.  In the interim, we Avanna Sowder restart her flecainide and start her on diltiazem 180 mg daily.  Crislyn Willbanks plan for cardioversion.  Risk, benefits, and alternatives to EP study and radiofrequency ablation for afib were also discussed in detail today. These risks include but are not limited to stroke, bleeding, vascular damage, tamponade, perforation, damage to the esophagus, lungs, and other structures, pulmonary vein stenosis, worsening renal function, and death. The patient understands these risk and wishes to proceed.  We Conley Delisle therefore proceed with catheter ablation at the next available time.  Carto, ICE, anesthesia are requested for the procedure.  Merian Wroe also  obtain CT PV protocol prior to the procedure to exclude LAA thrombus and further evaluate atrial anatomy.  2.  Hypertension: Currently well-controlled  3.  Obesity: Lifestyle modification encouraged  4.  Second hypercoagulable state: Currently on Eliquis for atrial fibrillation  Current medicines are reviewed at length with the patient today.   The patient does not have concerns regarding her medicines.  The following changes were made today: Start to chiasm, flecainide  Labs/ tests ordered today include:  No orders of the defined types were placed in this encounter.     Disposition:   FU with Remberto Lienhard 6 months  Signed, Camary Sosa Jorja Loa, MD  02/27/2023 2:27 PM     [facility] [address] [place] [address] [phone] (office) [phone] (fax)

## 2023-02-27 NOTE — H&P (View-Only) (Signed)
   Electrophysiology Office Note   Date:  02/27/2023   ID:  Emma Rivera, DOB 08/30/1941, MRN [id number]  PCP:  Linthavong, Kanhka, MD  Cardiologist:  End Primary Electrophysiologist:  Saramarie Stinger Martin Torian Thoennes, MD    Chief Complaint: AF   History of Present Illness: Emma Rivera is a 81 y.o. female who is being seen today for the evaluation of AF at the request of Linthavong, Kanhka, MD. Presenting today for electrophysiology evaluation.  She has a history significant for persistent atrial fibrillation, hypertension, diabetes, CVA.  She is post atrial fibrillation ablation 06/13/2019.  Unfortunately she presents today in atrial fibrillation.  She thinks she is atrial fibrillation for the last 2 weeks.  She feels weak, fatigued, short of breath and is having trouble doing her daily activities.  She would prefer a rhythm control strategy.  Today, denies symptoms of palpitations, chest pain, PND, lower extremity edema, claudication, dizziness, presyncope, syncope, bleeding, or neurologic sequela. The patient is tolerating medications without difficulties.    Past Medical History:  Diagnosis Date   Atrial fibrillation    Depression    Esophageal spasm    Esophageal spasm    GERD (gastroesophageal reflux disease)    Heart murmur    HOH (hard of hearing)    Hypertension    Pre-diabetes    Stroke    Past Surgical History:  Procedure Laterality Date   ATRIAL FIBRILLATION ABLATION N/A 06/13/2019   Procedure: ATRIAL FIBRILLATION ABLATION;  Surgeon: Laia Wiley Martin, MD;  Location: [facility];  Service: Cardiovascular;  Laterality: N/A;   CARDIAC CATHETERIZATION     CARDIOVERSION N/A 04/01/2019   Procedure: CARDIOVERSION;  Surgeon: Nelson, Katarina H, MD;  Location: [facility];  Service: Cardiovascular;  Laterality: N/A;   CATARACT EXTRACTION W/PHACO Left 09/09/2015   Procedure: CATARACT EXTRACTION PHACO AND INTRAOCULAR LENS PLACEMENT (IOC);  Surgeon: Nisha Mukherjee, MD;   Location: [facility] ORS;  Service: Ophthalmology;  Laterality: Left;  US   1:10.5 AP     8.6 CDE  6.05 casette lot #  [id number]   CATARACT EXTRACTION W/PHACO Right 09/30/2015   Procedure: CATARACT EXTRACTION PHACO AND INTRAOCULAR LENS PLACEMENT (IOC);  Surgeon: Nisha Mukherjee, MD;  Location: [facility] ORS;  Service: Ophthalmology;  Laterality: Right;  US 01:00.3 AP%: 11.6 CDE: 7.00 FLUID LOT# [id number]   COLONOSCOPY     COLONOSCOPY WITH PROPOFOL N/A 04/04/2017   Procedure: COLONOSCOPY WITH PROPOFOL;  Surgeon: Elliott, Robert T, MD;  Location: [facility] ENDOSCOPY;  Service: Endoscopy;  Laterality: N/A;   TONSILLECTOMY       Current Outpatient Medications  Medication Sig Dispense Refill   acetaminophen (TYLENOL) 500 MG tablet Take 1,000 mg by mouth every 6 (six) hours as needed for moderate pain or headache.     albuterol (VENTOLIN HFA) 108 (90 Base) MCG/ACT inhaler INHALE 2 PUFFS PRIOR TO EXERCISE     amLODipine (NORVASC) 10 MG tablet Take 1 tablet (10 mg total) by mouth daily. Please schedule appointment for further refills. 1st attempt. 30 tablet 1   apixaban (ELIQUIS) 5 MG TABS tablet Take 1 tablet (5 mg total) by mouth 2 (two) times daily. 60 tablet 0   atorvastatin (LIPITOR) 20 MG tablet TAKE 1 TABLET (20 MG TOTAL) BY MOUTH DAILY AT 6 PM. 90 tablet 2   Carboxymethylcellul-Glycerin (LUBRICATING EYE DROPS OP) Place 1 drop into both eyes daily as needed (dry eyes).     citalopram (CELEXA) 20 MG tablet Take 20 mg by mouth daily.       diltiazem (CARDIZEM CD) 120 MG 24 hr capsule Take 1 capsule (120 mg total) by mouth daily. 30 capsule 3   flecainide (TAMBOCOR) 100 MG tablet Take 1 tablet (100 mg total) by mouth 2 (two) times daily. 60 tablet 3   Fluticasone Furoate (ARNUITY ELLIPTA) 100 MCG/ACT AEPB Inhale 1 puff into the lungs daily.     hydrochlorothiazide (HYDRODIURIL) 25 MG tablet Take 25 mg by mouth daily.     loratadine (CLARITIN) 10 MG tablet Take 10 mg by mouth daily as needed for allergies.      losartan (COZAAR) 100 MG tablet Take 100 mg by mouth every evening.      Multiple Vitamin (MULTIVITAMIN) tablet Take 1 tablet by mouth daily.     omeprazole (PRILOSEC) 20 MG capsule Take 20 mg by mouth daily.     tolterodine (DETROL LA) 2 MG 24 hr capsule Take 2 mg by mouth 2 (two) times daily.      No current facility-administered medications for this visit.    Allergies:   Imdur [isosorbide nitrate] and Sulfa antibiotics   Social History:  The patient  reports that she has never smoked. She has never used smokeless tobacco. She reports current alcohol use of about 1.0 standard drink of alcohol per week. She reports that she does not use drugs.   Family History:  The patient's family history includes Breast cancer in her maternal aunt; Heart disease in her father and mother; Hypertension in her father and mother.   ROS:  Please see the history of present illness.   Otherwise, review of systems is positive for none.   All other systems are reviewed and negative.   PHYSICAL EXAM: VS:  BP 120/64   Pulse 96   Ht 5' 2" (1.575 m)   Wt 179 lb (81.2 kg)   SpO2 99%   BMI 32.74 kg/m  , BMI Body mass index is 32.74 kg/m. GEN: Well nourished, well developed, in no acute distress  HEENT: normal  Neck: no JVD, carotid bruits, or masses Cardiac: irregular; no murmurs, rubs, or gallops,no edema  Respiratory:  clear to auscultation bilaterally, normal work of breathing GI: soft, nontender, nondistended, + BS MS: no deformity or atrophy  Skin: warm and dry Neuro:  Strength and sensation are intact Psych: euthymic mood, full affect  EKG:  ECG is ordered today. Personal review of the ekg ordered shows atrial fibrillation   Recent Labs: No results found for requested labs within last 365 days.    Lipid Panel     Component Value Date/Time   CHOL 164 12/17/2018 0507   TRIG 54 12/17/2018 0507   HDL 56 12/17/2018 0507   CHOLHDL 2.9 12/17/2018 0507   VLDL 11 12/17/2018 0507   LDLCALC 97  12/17/2018 0507     Wt Readings from Last 3 Encounters:  02/27/23 179 lb (81.2 kg)  01/04/23 183 lb (83 kg)  06/12/22 178 lb (80.7 kg)    For the Eliquis  Other studies Reviewed: Additional studies/ records that were reviewed today include: TTE 12/18/18  Review of the above records today demonstrates:   1. The left ventricle has normal systolic function of 60-65%. The cavity size is normal. There is no increased left ventricular wall thickness. Left ventricular diastology could not be evaluated secondary to atrial fibrillation.  2. The right ventricle has normal systolic function. The cavity in normal in size. There is no increase in right ventricular wall thickness.  3. Mildly dilated left atrial size.  4.   The mitral valve is normal in structure There is mild thickening.  5. The aortic valve is tricuspid. There is mild thickening of the aortic valve.  6. Normal LV systolic function; mild TR.   ASSESSMENT AND PLAN:  1.  Persistent atrial fibrillation: Status post ablation 06/13/2019.  Currently on Eliquis with a CHA2DS2-VASc of 6.  She is in atrial fibrillation today.  She feels weak, fatigued, short of breath.  She going to get back into normal rhythm.  She would like to avoid long-term medications.  Due to that, we Kobie Matkins plan for ablation.  In the interim, we Jerin Franzel restart her flecainide and start her on diltiazem 180 mg daily.  Aldena Worm plan for cardioversion.  Risk, benefits, and alternatives to EP study and radiofrequency ablation for afib were also discussed in detail today. These risks include but are not limited to stroke, bleeding, vascular damage, tamponade, perforation, damage to the esophagus, lungs, and other structures, pulmonary vein stenosis, worsening renal function, and death. The patient understands these risk and wishes to proceed.  We Iasha Mccalister therefore proceed with catheter ablation at the next available time.  Carto, ICE, anesthesia are requested for the procedure.  Lynk Marti also  obtain CT PV protocol prior to the procedure to exclude LAA thrombus and further evaluate atrial anatomy.  2.  Hypertension: Currently well-controlled  3.  Obesity: Lifestyle modification encouraged  4.  Second hypercoagulable state: Currently on Eliquis for atrial fibrillation  Current medicines are reviewed at length with the patient today.   The patient does not have concerns regarding her medicines.  The following changes were made today: Start to chiasm, flecainide  Labs/ tests ordered today include:  No orders of the defined types were placed in this encounter.     Disposition:   FU with Aizlynn Digilio 6 months  Signed, Taylen Wendland Martin Kida Digiulio, MD  02/27/2023 2:27 PM     [facility] [address] [place] [address] [phone] (office) [phone] (fax) 

## 2023-02-27 NOTE — Patient Instructions (Signed)
Medication Instructions:  Your physician has recommended you make the following change in your medication:  RESTART Flecainide 100 mg twice a day RESTART Diltiazem 120 mg once a day  *If you need a refill on your cardiac medications before your next appointment, please call your pharmacy*   Lab Work: None ordered If you have labs (blood work) drawn today and your tests are completely normal, you will receive your results only by: MyChart Message (if you have MyChart) OR A paper copy in the mail If you have any lab test that is abnormal or we need to change your treatment, we will call you to review the results.   Testing/Procedures: Your physician has recommended that you have a Cardioversion (DCCV). Electrical Cardioversion uses a jolt of electricity to your heart either through paddles or wired patches attached to your chest. This is a controlled, usually prescheduled, procedure. Defibrillation is done under light anesthesia in the hospital, and you usually go home the day of the procedure. This is done to get your heart back into a normal rhythm. You are not awake for the procedure. Please see the instruction sheet given to you today.   Follow-Up: At [facility], you and your health needs are our priority.  As part of our continuing mission to provide you with exceptional heart care, we have created designated Provider Care Teams.  These Care Teams include your primary Cardiologist (physician) and Advanced Practice Providers (APPs -  Physician Assistants and Nurse Practitioners) who all work together to provide you with the care you need, when you need it.   Your next appointment:   3 month(s)  The format for your next appointment:   In Person  Provider:   Loman Brooklyn, MD    Thank you for choosing [facility]!!   Dory Horn, RN [phone]  Other Instructions      Dear Emma Rivera  You are scheduled for a Cardioversion on Monday, April 29 with Dr.  Shari Prows.  Please arrive at the [facility] (Main Entrance A) at [facility]: [address] [place], [place] [address] at 9:30 AM.    DIET:  Nothing to eat or drink after midnight except a sip of water with medications (see medication instructions below)  MEDICATION INSTRUCTIONS: !!IF ANY NEW MEDICATIONS ARE STARTED AFTER TODAY, PLEASE NOTIFY YOUR PROVIDER AS SOON AS POSSIBLE!!  FYI: Medications such as Semaglutide (Ozempic, [place]), Tirzepatide (Mounjaro, Zepbound), Dulaglutide (Trulicity), etc ("GLP1 agonists") must be held around the time of a procedure. Talk to your provider if you take one of these.  Hold your morning medications, TAKE ONLY YOUR ELIQUIS  Continue taking your anticoagulant (blood thinner): Apixaban (Eliquis).  You will need to continue this after your procedure until you are told by your provider that it is safe to stop.    LABS:  Your labs will be done at the hospital prior to your procedure   FYI:  For your safety, and to allow [place] to monitor your vital signs accurately during the surgery/procedure we request: If you have artificial nails, gel coating, SNS etc, please have those removed prior to your surgery/procedure. Not having the nail coverings /polish removed may result in cancellation or delay of your surgery/procedure.  You must have a responsible person to drive you home and stay in the waiting area during your procedure. Failure to do so could result in cancellation.  Bring your insurance cards.  *Special Note: Every effort is made to have your procedure done  on time. Occasionally there are emergencies that occur at the hospital that may cause delays. Please be patient if a delay does occur.

## 2023-02-28 ENCOUNTER — Encounter: Payer: Self-pay | Admitting: Cardiology

## 2023-03-09 ENCOUNTER — Telehealth: Payer: Self-pay

## 2023-03-09 ENCOUNTER — Encounter: Payer: Self-pay | Admitting: Cardiology

## 2023-03-09 NOTE — Progress Notes (Signed)
Spoke with patient, Procedure scheduled for 1030, Please arrive at the hospital at 0930, NPO after midnight on Sunday, May take meds with sips of water in the AM, please have transportation for home post procedure, and someone to stay with you for approximately 24 hours after

## 2023-03-09 NOTE — Telephone Encounter (Signed)
Patient sent message via MyChart:   I am so dizzy that I can hardly make it from one room to another. Please call me to discuss the medications that I am taking. [phone] or [phone]. Thanks. Emma Rivera   Pt stated she thinks the dizziness is from the medication that Dr. Elberta Fortis prescribed. Pt blood pressure this afternoon 117/75 HR 67, pt was advised to hold diltiazem. Pt voiced understanding. Will forward to MD and nurse.

## 2023-03-11 NOTE — Anesthesia Preprocedure Evaluation (Signed)
Anesthesia Evaluation  Patient identified by MRN, date of birth, ID band Patient awake    Reviewed: Allergy & Precautions, NPO status , Patient's Chart, lab work & pertinent test results  Airway Mallampati: II  TM Distance: >3 FB Neck ROM: Full    Dental no notable dental hx. (+) Teeth Intact, Dental Advisory Given   Pulmonary neg pulmonary ROS   Pulmonary exam normal breath sounds clear to auscultation       Cardiovascular hypertension, Pt. on medications + angina  Normal cardiovascular exam+ dysrhythmias (eliquis) Atrial Fibrillation  Rhythm:Irregular Rate:Normal  TTE 2020 1. The left ventricle has normal systolic function of 60-65%. The cavity  size is normal. There is no increased left ventricular wall thickness.  Left ventricular diastology could not be evaluated secondary to atrial  fibrillation.   2. The right ventricle has normal systolic function. The cavity in normal  in size. There is no increase in right ventricular wall thickness.   3. Mildly dilated left atrial size.   4. The mitral valve is normal in structure There is mild thickening.   5. The aortic valve is tricuspid. There is mild thickening of the aortic  valve.   6. Normal LV systolic function; mild TR.     Neuro/Psych  PSYCHIATRIC DISORDERS  Depression    CVA, No Residual Symptoms    GI/Hepatic Neg liver ROS,GERD  ,,  Endo/Other  negative endocrine ROS    Renal/GU negative Renal ROS  negative genitourinary   Musculoskeletal negative musculoskeletal ROS (+)    Abdominal   Peds  Hematology negative hematology ROS (+)   Anesthesia Other Findings   Reproductive/Obstetrics                             Anesthesia Physical Anesthesia Plan  ASA: 3  Anesthesia Plan: General   Post-op Pain Management: Minimal or no pain anticipated   Induction: Intravenous  PONV Risk Score and Plan: 3 and Propofol infusion and  Treatment may vary due to age or medical condition  Airway Management Planned: Natural Airway  Additional Equipment:   Intra-op Plan:   Post-operative Plan:   Informed Consent: I have reviewed the patients History and Physical, chart, labs and discussed the procedure including the risks, benefits and alternatives for the proposed anesthesia with the patient or authorized representative who has indicated his/her understanding and acceptance.     Dental advisory given  Plan Discussed with: CRNA  Anesthesia Plan Comments:        Anesthesia Quick Evaluation

## 2023-03-12 ENCOUNTER — Encounter (HOSPITAL_COMMUNITY)
Admission: RE | Disposition: A | Discharge status: Home / Self Care | Payer: Self-pay | Source: Home / Self Care | Attending: Cardiology

## 2023-03-12 ENCOUNTER — Ambulatory Visit (HOSPITAL_COMMUNITY)
Admission: RE | Admit: 2023-03-12 | Discharge: 2023-03-12 | Disposition: A | Discharge status: Home / Self Care | Payer: Medicare PPO | Attending: Cardiology | Admitting: Cardiology

## 2023-03-12 ENCOUNTER — Other Ambulatory Visit: Payer: Self-pay

## 2023-03-12 ENCOUNTER — Ambulatory Visit (HOSPITAL_COMMUNITY): Payer: Medicare PPO | Admitting: Anesthesiology

## 2023-03-12 ENCOUNTER — Encounter (HOSPITAL_COMMUNITY): Payer: Self-pay | Admitting: Cardiology

## 2023-03-12 ENCOUNTER — Ambulatory Visit (HOSPITAL_BASED_OUTPATIENT_CLINIC_OR_DEPARTMENT_OTHER): Payer: Medicare PPO | Admitting: Anesthesiology

## 2023-03-12 DIAGNOSIS — Z8673 Personal history of transient ischemic attack (TIA), and cerebral infarction without residual deficits: Secondary | ICD-10-CM | POA: Diagnosis not present

## 2023-03-12 DIAGNOSIS — E119 Type 2 diabetes mellitus without complications: Secondary | ICD-10-CM | POA: Insufficient documentation

## 2023-03-12 DIAGNOSIS — I4819 Other persistent atrial fibrillation: Secondary | ICD-10-CM | POA: Insufficient documentation

## 2023-03-12 DIAGNOSIS — Z7901 Long term (current) use of anticoagulants: Secondary | ICD-10-CM | POA: Insufficient documentation

## 2023-03-12 DIAGNOSIS — E669 Obesity, unspecified: Secondary | ICD-10-CM | POA: Diagnosis not present

## 2023-03-12 DIAGNOSIS — I129 Hypertensive chronic kidney disease with stage 1 through stage 4 chronic kidney disease, or unspecified chronic kidney disease: Secondary | ICD-10-CM | POA: Diagnosis not present

## 2023-03-12 DIAGNOSIS — I4891 Unspecified atrial fibrillation: Secondary | ICD-10-CM | POA: Diagnosis not present

## 2023-03-12 DIAGNOSIS — Z6832 Body mass index (BMI) 32.0-32.9, adult: Secondary | ICD-10-CM | POA: Diagnosis not present

## 2023-03-12 DIAGNOSIS — I10 Essential (primary) hypertension: Secondary | ICD-10-CM | POA: Diagnosis not present

## 2023-03-12 DIAGNOSIS — D6859 Other primary thrombophilia: Secondary | ICD-10-CM | POA: Insufficient documentation

## 2023-03-12 HISTORY — PX: CARDIOVERSION: SHX1299

## 2023-03-12 LAB — POCT I-STAT, CHEM 8
BUN: 24 mg/dL — ABNORMAL HIGH (ref 8–23)
Calcium, Ion: 1.21 mmol/L (ref 1.15–1.40)
Chloride: 100 mmol/L (ref 98–111)
Creatinine, Ser: 1.1 mg/dL — ABNORMAL HIGH (ref 0.44–1.00)
Glucose, Bld: 99 mg/dL (ref 70–99)
HCT: 42 % (ref 36.0–46.0)
Hemoglobin: 14.3 g/dL (ref 12.0–15.0)
Potassium: 4.1 mmol/L (ref 3.5–5.1)
Sodium: 141 mmol/L (ref 135–145)
TCO2: 28 mmol/L (ref 22–32)

## 2023-03-12 SURGERY — CARDIOVERSION
Anesthesia: General

## 2023-03-12 MED ORDER — LIDOCAINE 2% (20 MG/ML) 5 ML SYRINGE
INTRAMUSCULAR | Status: DC | PRN
  Administered 2023-03-12: 60 mg via INTRAVENOUS

## 2023-03-12 MED ORDER — PROPOFOL 10 MG/ML IV BOLUS
INTRAVENOUS | Status: DC | PRN
  Administered 2023-03-12: 50 mg via INTRAVENOUS

## 2023-03-12 MED ORDER — SODIUM CHLORIDE 0.9 % IV SOLN: INTRAVENOUS | Status: DC

## 2023-03-12 SURGICAL SUPPLY — 1 items: ELECT DEFIB PAD ADLT CADENCE (PAD) ×1 IMPLANT

## 2023-03-12 NOTE — Anesthesia Postprocedure Evaluation (Signed)
Anesthesia Post Note  Patient: Emma Rivera  Procedure(s) Performed: CARDIOVERSION     Patient location during evaluation: PACU Anesthesia Type: General Level of consciousness: awake and alert Pain management: pain level controlled Vital Signs Assessment: post-procedure vital signs reviewed and stable Respiratory status: spontaneous breathing, nonlabored ventilation, respiratory function stable and patient connected to nasal cannula oxygen Cardiovascular status: blood pressure returned to baseline and stable Postop Assessment: no apparent nausea or vomiting Anesthetic complications: no  No notable events documented.  Last Vitals:  Vitals:   03/12/23 1050 03/12/23 1100  BP: 139/71 131/63  Pulse: (!) 54 (!) 58  Resp: 13 14  Temp:    SpO2: 97% 97%    Last Pain:  Vitals:   03/12/23 1100  TempSrc:   PainSc: 0-No pain                 Makhayla Mcmurry L Meliya Mcconahy

## 2023-03-12 NOTE — Interval H&P Note (Signed)
History and Physical Interval Note:  03/12/2023 10:11 AM  Emma Rivera  has presented today for surgery, with the diagnosis of AFIB.  The various methods of treatment have been discussed with the patient and family. After consideration of risks, benefits and other options for treatment, the patient has consented to  Procedure(s): CARDIOVERSION (N/A) as a surgical intervention.  The patient's history has been reviewed, patient examined, no change in status, stable for surgery.  I have reviewed the patient's chart and labs.  Questions were answered to the patient's satisfaction.     Meriam Sprague

## 2023-03-12 NOTE — CV Procedure (Signed)
Procedure: Electrical Cardioversion Indications:  Atrial Fibrillation  Procedure Details:  Consent: Risks of procedure as well as the alternatives and risks of each were explained to the (patient/caregiver).  Consent for procedure obtained.  Time Out: Verified patient identification, verified procedure, site/side was marked, verified correct patient position, special equipment/implants available, medications/allergies/relevent history reviewed, required imaging and test results available. PERFORMED.  Patient placed on cardiac monitor, pulse oximetry, supplemental oxygen as necessary.  Sedation given:  Propofol 50mg ; lidocaine 60mg  Pacer pads placed anterior and posterior chest.  Cardioverted 1 time(s).  Cardioversion with synchronized biphasic 150J shock.  Evaluation: Findings: Post procedure EKG shows:  Sinus bradycardia Complications: None Patient did tolerate procedure well.  Time Spent Directly with the Patient:    Meriam Sprague 03/12/2023, 10:35 AM

## 2023-03-12 NOTE — Transfer of Care (Signed)
Immediate Anesthesia Transfer of Care Note  Patient: Emma Rivera  Procedure(s) Performed: CARDIOVERSION  Patient Location: Cath Lab  Anesthesia Type:General  Level of Consciousness: drowsy  Airway & Oxygen Therapy: Patient Spontanous Breathing and Patient connected to nasal cannula oxygen  Post-op Assessment: Report given to RN and Post -op Vital signs reviewed and stable  Post vital signs: Reviewed and stable  Last Vitals:  Vitals Value Taken Time  BP    Temp    Pulse    Resp    SpO2      Last Pain:  Vitals:   03/12/23 0931  TempSrc: Temporal         Complications: No notable events documented.

## 2023-03-12 NOTE — Telephone Encounter (Signed)
Pt advised to stop Diltiazem per Dr. Elberta Fortis. Pt states she was told that today by hospital when she was there for her DCCV. Pt agreeable to stopping and will call the office is she has any HR issues after stopping.

## 2023-03-12 NOTE — Anesthesia Procedure Notes (Signed)
Procedure Name: General with mask airway Date/Time: 03/12/2023 10:14 AM  Performed by: Jodell Cipro, CRNAPre-anesthesia Checklist: Patient identified, Emergency Drugs available, Suction available and Patient being monitored Patient Re-evaluated:Patient Re-evaluated prior to induction Oxygen Delivery Method: Nasal cannula Preoxygenation: Pre-oxygenation with 100% oxygen Placement Confirmation: positive ETCO2 Dental Injury: Teeth and Oropharynx as per pre-operative assessment

## 2023-03-12 NOTE — Discharge Instructions (Signed)

## 2023-03-13 ENCOUNTER — Encounter: Payer: Self-pay | Admitting: Cardiology

## 2023-03-13 NOTE — Telephone Encounter (Signed)
Spoke with Dr. Elberta Fortis - patient will hold flecainide for now and follow up with APP.  Patient has been scheduled with Levy Sjogren, NP on Friday. Patient is aware.

## 2023-03-13 NOTE — Telephone Encounter (Signed)
Patient reports that she has been feeling dizzy, short of breath, and fatigued all day. She states that her heart rate has been dropping into the 40s. She had a successful cardioversion yesterday. She states that she gets short of breath walking around her house from room to room. She also reports feeling light headed even at rest. She does not feel like she is going to pass out. Advised to rest and move positions slowly. She has taken her medications as normal today, including flecainide around 2pm.

## 2023-03-16 ENCOUNTER — Encounter: Payer: Self-pay | Admitting: Cardiology

## 2023-03-16 ENCOUNTER — Ambulatory Visit (INDEPENDENT_AMBULATORY_CARE_PROVIDER_SITE_OTHER): Payer: Medicare PPO

## 2023-03-16 ENCOUNTER — Ambulatory Visit: Payer: Medicare PPO | Attending: Cardiology | Admitting: Cardiology

## 2023-03-16 VITALS — BP 130/72 | HR 51 | Ht 62.0 in | Wt 178.0 lb

## 2023-03-16 DIAGNOSIS — R0609 Other forms of dyspnea: Secondary | ICD-10-CM

## 2023-03-16 DIAGNOSIS — R001 Bradycardia, unspecified: Secondary | ICD-10-CM | POA: Diagnosis not present

## 2023-03-16 DIAGNOSIS — I4819 Other persistent atrial fibrillation: Secondary | ICD-10-CM | POA: Diagnosis not present

## 2023-03-16 DIAGNOSIS — I480 Paroxysmal atrial fibrillation: Secondary | ICD-10-CM

## 2023-03-16 DIAGNOSIS — D6869 Other thrombophilia: Secondary | ICD-10-CM | POA: Diagnosis not present

## 2023-03-16 NOTE — Progress Notes (Signed)
Cardiology Office Note Date:  03/16/2023  Patient ID:  Emma Rivera, Emma Rivera 09/27/1941, MRN [id number] PCP:  [name], MD  Cardiologist:  Yvonne Kendall, MD Electrophysiologist: Regan Lemming, MD    Chief Complaint: sob, foggy-headed after DCCV  History of Present Illness: Emma Rivera is a 82 y.o. female with PMH notable for persis AFib, HTN, CVA; seen today for Emma Jorja Loa, MD for acute visit due to SOB, bradycardia after DCCV.   She is s/p AF ablation 05/2019 She saw Dr. Elberta Fortis 02/2023 in AF, feeling weak, fatigue, SOB. Found to be in AFib. She requested to avoid long-term meds. Recommended redo ablation, sched for Aug 2024. DCCV at next available. He started flecainide 100 mg twice daily and diltiazem 180 mg daily She is s/p DCCV earlier this week with return to sinus bradycardia.  She sent msg to clinic staff the day after the cardioversion that she was feeling dizzy, short of breath, fatigued all day.  Her heart rate was dropping into the high 40s.  Dr. Elberta Fortis had recommended to hold flecainide and dilt and to follow-up in clinic.  She presents today still feeling SOB, fatigued, foggy-headed, intermittent bilat forearm tingling. She uses a fit bit to monitor HR, most readings have been in 50s, rarely high 40s. She last took flecainide 5/1 (two days ago). She denies syncope or presyncope. No edema, good appetite, no chest pain/pressure.  AAD History: Flecainide, stopped   Past Medical History:  Diagnosis Date   Atrial fibrillation (HCC)    Depression    Esophageal spasm    Esophageal spasm    GERD (gastroesophageal reflux disease)    Heart murmur    HOH (hard of hearing)    Hypertension    Pre-diabetes    Stroke [facility])     Past Surgical History:  Procedure Laterality Date   ATRIAL FIBRILLATION ABLATION N/A 06/13/2019   Procedure: ATRIAL FIBRILLATION ABLATION;  Surgeon: Regan Lemming, MD;  [facility]: [facility] INVASIVE CV LAB;  Service: Cardiovascular;   Laterality: N/A;   CARDIAC CATHETERIZATION     CARDIOVERSION N/A 04/01/2019   Procedure: CARDIOVERSION;  Surgeon: Lars Masson, MD;  Location: [facility] ENDOSCOPY;  Service: Cardiovascular;  Laterality: N/A;   CARDIOVERSION N/A 03/12/2023   Procedure: CARDIOVERSION;  Surgeon: Meriam Sprague, MD;  Location: [facility] INVASIVE CV LAB;  Service: Cardiovascular;  Laterality: N/A;   CATARACT EXTRACTION W/PHACO Left 09/09/2015   Procedure: CATARACT EXTRACTION PHACO AND INTRAOCULAR LENS PLACEMENT (IOC);  Surgeon: Lia Hopping, MD;  Location: [facility] ORS;  Service: Ophthalmology;  Laterality: Left;  [place]   1:10.5 AP     8.6 CDE  6.05 casette lot #  [id number]   CATARACT EXTRACTION W/PHACO Right 09/30/2015   Procedure: CATARACT EXTRACTION PHACO AND INTRAOCULAR LENS PLACEMENT (IOC);  Surgeon: Lia Hopping, MD;  Location: [facility] ORS;  Service: Ophthalmology;  Laterality: Right;  [place] 01:00.3 AP%: 11.6 CDE: 7.00 FLUID LOT# [id number]   COLONOSCOPY     COLONOSCOPY WITH PROPOFOL N/A 04/04/2017   Procedure: COLONOSCOPY WITH PROPOFOL;  Surgeon: Scot Jun, MD;  Location: [facility] ENDOSCOPY;  Service: Endoscopy;  Laterality: N/A;   TONSILLECTOMY      Current Outpatient Medications  Medication Instructions   acetaminophen (TYLENOL) 1,000 mg, Oral, Every 6 hours PRN   albuterol (VENTOLIN HFA) 108 (90 Base) MCG/ACT inhaler 1-2 puffs, Inhalation, Every 6 hours PRN   amLODipine (NORVASC) 10 mg, Oral, Daily, Please schedule appointment for further refills. 1st attempt.   apixaban (ELIQUIS) 5  mg, Oral, 2 times daily   atorvastatin (LIPITOR) 20 mg, Oral, Daily-1800   Carboxymethylcellul-Glycerin (LUBRICATING EYE DROPS OP) 1 drop, Both Eyes, Daily PRN   citalopram (CELEXA) 20 mg, Oral, Every morning   Fluticasone Furoate 50 MCG/ACT AEPB 1 puff, Inhalation, Every morning, ARNUITY ELLIPTA   hydrochlorothiazide (HYDRODIURIL) 25 mg, Oral, Every morning   loratadine (CLARITIN) 10 mg, Oral, Daily PRN   losartan (COZAAR)  100 mg, Oral, Every evening   Multiple Vitamin (MULTIVITAMIN) tablet 1 tablet, Oral, Every morning   omeprazole (PRILOSEC) 20 mg, Oral, Daily before breakfast   tolterodine (DETROL) 2 mg, Oral, 2 times daily    Social History:  The patient  reports that she has never smoked. She has never used smokeless tobacco. She reports current alcohol use of about 1.0 standard drink of alcohol per week. She reports that she does not use drugs.   Family History:  The patient's family history includes Breast cancer in her maternal aunt; Heart disease in her father and mother; Hypertension in her father and mother.  ROS:  Please see the history of present illness. All other systems are reviewed and otherwise negative.   PHYSICAL EXAM:  VS:  BP 130/72 (BP Location: Left Arm, Patient Position: Sitting, Cuff Size: Large)   Pulse (!) 51   Ht 5\' 2"  (1.575 m)   Wt 178 lb (80.7 kg)   SpO2 97%   BMI 32.56 kg/m  BMI: Body mass index is 32.56 kg/m.  GEN- The patient is well appearing, alert and oriented x 3 today.   Lungs- Clear to ausculation bilaterally, normal work of breathing.  Heart- Regular, bradycardic rate and rhythm, no murmurs, rubs or gallops Extremities- No peripheral edema, warm, dry   EKG is ordered. Personal review of EKG from today shows:  SB at 51bpm  Recent Labs: 03/12/2023: BUN 24; Creatinine, Ser 1.10; Hemoglobin 14.3; Potassium 4.1; Sodium 141  No results found for requested labs within last 365 days.   Estimated Creatinine Clearance: 40.1 mL/min (A) (by C-G formula based on SCr of 1.1 mg/dL (H)).   Wt Readings from Last 3 Encounters:  03/16/23 178 lb (80.7 kg)  03/12/23 178 lb (80.7 kg)  02/27/23 179 lb (81.2 kg)     Additional studies reviewed include: Previous EP, cardiology notes.   NM myocardial SPECT, 07/18/2019 Pharmacological myocardial perfusion imaging study with no significant  ischemia Normal wall motion, EF estimated at 74% No EKG changes concerning for  ischemia at peak stress or in recovery. Low risk scan  AF ablation, 06/13/2019 1. Comprehensive electrophysiologic study. 2. Coronary sinus pacing and recording. 3. Three-dimensional mapping of atrial fibrillation with additional mapping and ablation of a second discrete focus 4. Ablation of atrial fibrillation with additional mapping and ablation of a second discrete focus 5. Intracardiac echocardiography. 6. Transseptal puncture of an intact septum. 7. Arrhythmia induction with pacing with dobutamine infusion  TTE, 12/18/2018  1. The left ventricle has normal systolic function of 60-65%. The cavity size is normal. There is no increased left ventricular wall thickness. Left ventricular diastology could not be evaluated secondary to atrial fibrillation.  2. The right ventricle has normal systolic function. The cavity in normal in size. There is no increase in right ventricular wall thickness.  3. Mildly dilated left atrial size.  4. The mitral valve is normal in structure There is mild thickening.  5. The aortic valve is tricuspid. There is mild thickening of the aortic valve.  6. Normal LV systolic function; mild TR.  ASSESSMENT AND PLAN:  #) parox afib #) bradycardia S/p AF ablation 05/2019 Recently had AF recurrence, now s/p DCCV earlier this week She has felt dizzy, SOB, foggy-headed since DCCV Stopped flecainide, last dose 5/1 She walked around clinic and pulse increased to lower 60s, began feeling poorly  Discussed that flecainide may still be in system and causing symptoms Recommend updated echo given symptoms with flecainide 7day ambulatory monitor to correlate symptoms with HR ER precautions discussed - syncope, presyncope   #) Hypercoag d/t AFib CHA2DS2-VASc Score = 6 [CHF History: 0, HTN History: 1, Diabetes History: 0, Stroke History: 2, Vascular Disease History: 0, Age Score: 2, Gender Score: 1].  Therefore, the patient's annual risk of stroke is 9.7 %. OAC-Eliquis 5  mg twice daily, appropriately dosed      Current medicines are reviewed at length with the patient today.   The patient does not have concerns regarding her medicines.  The following changes were made today:  none  Labs/ tests ordered today include:  Orders Placed This Encounter  Procedures   LONG TERM MONITOR (3-14 DAYS)   EKG 12-Lead   ECHOCARDIOGRAM COMPLETE     Disposition: Follow up with EP APP in  4-6 weeks after testing    Signed, Sherie Don, NP  03/16/23  2:02 PM  Electrophysiology [facility]

## 2023-03-16 NOTE — Patient Instructions (Signed)
Medication Instructions:  Your physician recommends that you continue on your current medications as directed. Please refer to the Current Medication list given to you today.  *If you need a refill on your cardiac medications before your next appointment, please call your pharmacy*   Lab Work: No labs ordered  If you have labs (blood work) drawn today and your tests are completely normal, you will receive your results only by: MyChart Message (if you have MyChart) OR A paper copy in the mail If you have any lab test that is abnormal or we need to change your treatment, we will call you to review the results.   Testing/Procedures: Your physician has requested that you have an echocardiogram. Echocardiography is a painless test that uses sound waves to create images of your heart. It provides your doctor with information about the size and shape of your heart and how well your heart's chambers and valves are working. This procedure takes approximately one hour. There are no restrictions for this procedure. Please do NOT wear cologne, perfume, aftershave, or lotions (deodorant is allowed). Please arrive 15 minutes prior to your appointment time.  Your physician has recommended that you wear a Zio monitor.   This monitor is a medical device that records the heart's electrical activity. Doctors most often use these monitors to diagnose arrhythmias. Arrhythmias are problems with the speed or rhythm of the heartbeat. The monitor is a small device applied to your chest. You can wear one while you do your normal daily activities. While wearing this monitor if you have any symptoms to push the button and record what you felt. Once you have worn this monitor for the period of time provider prescribed (Usually 14 days), you will return the monitor device in the postage paid box. Once it is returned they will download the data collected and provide [place] with a report which the provider will then review and we  will call you with those results. Important tips:  Avoid showering during the first 24 hours of wearing the monitor. Avoid excessive sweating to help maximize wear time. Do not submerge the device, no hot tubs, and no swimming pools. Keep any lotions or oils away from the patch. After 24 hours you may shower with the patch on. Take brief showers with your back facing the shower head.  Do not remove patch once it has been placed because that will interrupt data and decrease adhesive wear time. Push the button when you have any symptoms and write down what you were feeling. Once you have completed wearing your monitor, remove and place into box which has postage paid and place in your outgoing mailbox.  If for some reason you have misplaced your box then call our office and we can provide another box and/or mail it off for you.   Follow-Up: At [facility], you and your health needs are our priority.  As part of our continuing mission to provide you with exceptional heart care, we have created designated Provider Care Teams.  These Care Teams include your primary Cardiologist (physician) and Advanced Practice Providers (APPs -  Physician Assistants and Nurse Practitioners) who all work together to provide you with the care you need, when you need it.  We recommend signing up for the patient portal called "MyChart".  Sign up information is provided on this After Visit Summary.  MyChart is used to connect with patients for Virtual Visits (Telemedicine).  Patients are able to view lab/test results, encounter notes,  upcoming appointments, etc.  Non-urgent messages can be sent to your provider as well.   To learn more about what you can do with MyChart, go to [email or web address].    Your next appointment:   4-6 week(s)  Provider:   Sherie Don, NP

## 2023-03-21 DIAGNOSIS — R001 Bradycardia, unspecified: Secondary | ICD-10-CM

## 2023-03-21 DIAGNOSIS — R0609 Other forms of dyspnea: Secondary | ICD-10-CM | POA: Diagnosis not present

## 2023-03-22 NOTE — Telephone Encounter (Signed)
Called and spoke with patient regarding Suzann's recommendations. Patient encouraged to go to the ED for further evaluation but refused. Nurse again reiterated the importance of going to the ED. The patient continue to refuse. Appointment scheduled for tomorrow 03/23/23.    Sherie Don, NP  You18 minutes ago (3:49 PM)    Unfortunately, there are not medications to raise her HR that are safe to be given outside of the hospital. I think she should go to ER for further evaluation - labs, echo, etc. The flecainide and diltiazem should be completely out of her system by now if they were the original culprit to her symptoms.   Thank you, Luella Cook

## 2023-03-23 ENCOUNTER — Ambulatory Visit (HOSPITAL_COMMUNITY): Payer: Medicare PPO | Attending: Cardiology

## 2023-03-23 ENCOUNTER — Encounter: Payer: Self-pay | Admitting: Cardiology

## 2023-03-23 ENCOUNTER — Ambulatory Visit: Payer: Medicare PPO | Attending: Cardiology | Admitting: Cardiology

## 2023-03-23 ENCOUNTER — Other Ambulatory Visit
Admission: RE | Admit: 2023-03-23 | Discharge: 2023-03-23 | Disposition: A | Discharge status: Home / Self Care | Payer: Medicare PPO | Source: Ambulatory Visit | Attending: Cardiology | Admitting: Cardiology

## 2023-03-23 VITALS — BP 118/60 | HR 59 | Ht 62.0 in | Wt 177.0 lb

## 2023-03-23 DIAGNOSIS — I4819 Other persistent atrial fibrillation: Secondary | ICD-10-CM

## 2023-03-23 DIAGNOSIS — R001 Bradycardia, unspecified: Secondary | ICD-10-CM | POA: Diagnosis not present

## 2023-03-23 DIAGNOSIS — R42 Dizziness and giddiness: Secondary | ICD-10-CM

## 2023-03-23 DIAGNOSIS — R0609 Other forms of dyspnea: Secondary | ICD-10-CM | POA: Diagnosis present

## 2023-03-23 DIAGNOSIS — I5189 Other ill-defined heart diseases: Secondary | ICD-10-CM

## 2023-03-23 HISTORY — DX: Other ill-defined heart diseases: I51.89

## 2023-03-23 LAB — COMPREHENSIVE METABOLIC PANEL
ALT: 101 U/L — ABNORMAL HIGH (ref 0–44)
AST: 82 U/L — ABNORMAL HIGH (ref 15–41)
Albumin: 4.1 g/dL (ref 3.5–5.0)
Alkaline Phosphatase: 59 U/L (ref 38–126)
Anion gap: 6 (ref 5–15)
BUN: 22 mg/dL (ref 8–23)
CO2: 29 mmol/L (ref 22–32)
Calcium: 9.1 mg/dL (ref 8.9–10.3)
Chloride: 104 mmol/L (ref 98–111)
Creatinine, Ser: 1.01 mg/dL — ABNORMAL HIGH (ref 0.44–1.00)
GFR, Estimated: 56 mL/min — ABNORMAL LOW (ref >60)
Glucose, Bld: 89 mg/dL (ref 70–99)
Potassium: 4.2 mmol/L (ref 3.5–5.1)
Sodium: 139 mmol/L (ref 135–145)
Total Bilirubin: 0.8 mg/dL (ref 0.3–1.2)
Total Protein: 7.1 g/dL (ref 6.5–8.1)

## 2023-03-23 LAB — ECHOCARDIOGRAM COMPLETE
Area-P 1/2: 4.06 cm2
Height: 62 in
S' Lateral: 3.1 cm
Weight: 2832 oz

## 2023-03-23 LAB — CBC
HCT: 43.4 % (ref 36.0–46.0)
Hemoglobin: 14.1 g/dL (ref 12.0–15.0)
MCH: 31.7 pg (ref 26.0–34.0)
MCHC: 32.5 g/dL (ref 30.0–36.0)
MCV: 97.5 fL (ref 80.0–100.0)
Platelets: 222 10*3/uL (ref 150–400)
RBC: 4.45 MIL/uL (ref 3.87–5.11)
RDW: 13.5 % (ref 11.5–15.5)
WBC: 6.7 10*3/uL (ref 4.0–10.5)
nRBC: 0 % (ref 0.0–0.2)

## 2023-03-23 LAB — TSH: TSH: 5.531 u[IU]/mL — ABNORMAL HIGH (ref 0.350–4.500)

## 2023-03-23 MED ORDER — LOSARTAN POTASSIUM 50 MG PO TABS: 50.0000 mg | ORAL_TABLET | Freq: Every day | ORAL | 3 refills | Status: DC

## 2023-03-23 NOTE — Patient Instructions (Signed)
Medication Instructions:  DECREASE losartan to 50 mg by mouth daily  *If you need a refill on your cardiac medications before your next appointment, please call your pharmacy*   Lab Work: CMP, TSH, and CBC - Please go to the [facility]. You will check in at the front desk to the right as you walk into the atrium. Valet Parking is offered if needed. - No appointment needed. You may go any day between 7 am and 6 pm.  If you have labs (blood work) drawn today and your tests are completely normal, you will receive your results only by: MyChart Message (if you have MyChart) OR A paper copy in the mail If you have any lab test that is abnormal or we need to change your treatment, we will call you to review the results.   Testing/Procedures: No testing ordered  Follow-Up: At [facility], you and your health needs are our priority.  As part of our continuing mission to provide you with exceptional heart care, we have created designated Provider Care Teams.  These Care Teams include your primary Cardiologist (physician) and Advanced Practice Providers (APPs -  Physician Assistants and Nurse Practitioners) who all work together to provide you with the care you need, when you need it.  We recommend signing up for the patient portal called "MyChart".  Sign up information is provided on this After Visit Summary.  MyChart is used to connect with patients for Virtual Visits (Telemedicine).  Patients are able to view lab/test results, encounter notes, upcoming appointments, etc.  Non-urgent messages can be sent to your provider as well.   To learn more about what you can do with MyChart, go to [email or web address].    Your next appointment:   6 week(s)  Provider:   Sherie Don, NP

## 2023-03-23 NOTE — Progress Notes (Signed)
Cardiology Office Note Date:  03/23/2023  Patient ID:  Emma Rivera, Emma Rivera 04-Dec-1940, MRN [id number] PCP:  [name], MD  Cardiologist:  Yvonne Kendall, MD Electrophysiologist: Regan Lemming, MD    Chief Complaint: sob, dizzy, foggy-headed   History of Present Illness: Emma Rivera is a 82 y.o. female with PMH notable for persis AFib, HTN, CVA; seen today for Will Jorja Loa, MD for acute visit due to SOB, bradycardia after DCCV.   She is s/p AF ablation 05/2019 She saw Dr. Elberta Fortis 02/2023 in AF, feeling weak, fatigue, SOB. Found to be in AFib. She requested to avoid long-term meds. Recommended redo ablation, sched for Aug 2024. DCCV at next available. He started flecainide 100 mg twice daily and diltiazem 180 mg daily. Since her DCCV, she has been dizzy, SOB.  I saw her last week, she had recently stopped flecainide. Walking around clinic, pulse rose to 60s, but she felt worse. I recommended echo and continue to let flecainide wash out.  She called clinic yesterday with continued symptoms and was recommended to proceed to ER for further evaluation. She refused and preferred to be eval'd in clinic today.  She reports the same symptoms as prior visit. Has noticed that dizziness appears to be worse in the morning and slightly better by the afternoon/evenings. She takes losartan, tolterodine, and eliquis in evenings. Dosages of these meds has not changed.   She does not remember that she had an upper resp viral illness in early April. Covid test was negative, but then several days later she lost sense of smell and had altered sense of tasted. She questions whether she did, in fact, have covid and could that be the cause of her current symptoms.  No palpitations.   No bleeding concerns  AAD History: Flecainide, stopped   Past Medical History:  Diagnosis Date   Atrial fibrillation (HCC)    Depression    Esophageal spasm    Esophageal spasm    GERD (gastroesophageal  reflux disease)    Heart murmur    HOH (hard of hearing)    Hypertension    Pre-diabetes    Stroke [facility])     Past Surgical History:  Procedure Laterality Date   ATRIAL FIBRILLATION ABLATION N/A 06/13/2019   Procedure: ATRIAL FIBRILLATION ABLATION;  Surgeon: Regan Lemming, MD;  Location: [facility] INVASIVE CV LAB;  Service: Cardiovascular;  Laterality: N/A;   CARDIAC CATHETERIZATION     CARDIOVERSION N/A 04/01/2019   Procedure: CARDIOVERSION;  Surgeon: Lars Masson, MD;  Location: [facility] ENDOSCOPY;  Service: Cardiovascular;  Laterality: N/A;   CARDIOVERSION N/A 03/12/2023   Procedure: CARDIOVERSION;  Surgeon: Meriam Sprague, MD;  Location: [facility] INVASIVE CV LAB;  Service: Cardiovascular;  Laterality: N/A;   CATARACT EXTRACTION W/PHACO Left 09/09/2015   Procedure: CATARACT EXTRACTION PHACO AND INTRAOCULAR LENS PLACEMENT (IOC);  Surgeon: Lia Hopping, MD;  Location: [facility] ORS;  Service: Ophthalmology;  Laterality: Left;  [place]   1:10.5 AP     8.6 CDE  6.05 casette lot #  [id number]   CATARACT EXTRACTION W/PHACO Right 09/30/2015   Procedure: CATARACT EXTRACTION PHACO AND INTRAOCULAR LENS PLACEMENT (IOC);  Surgeon: Lia Hopping, MD;  Location: [facility] ORS;  Service: Ophthalmology;  Laterality: Right;  [place] 01:00.3 AP%: 11.6 CDE: 7.00 FLUID LOT# [id number]   COLONOSCOPY     COLONOSCOPY WITH PROPOFOL N/A 04/04/2017   Procedure: COLONOSCOPY WITH PROPOFOL;  Surgeon: Scot Jun, MD;  Location: [facility] ENDOSCOPY;  Service: Endoscopy;  Laterality: N/A;  TONSILLECTOMY      Current Outpatient Medications  Medication Instructions   acetaminophen (TYLENOL) 1,000 mg, Oral, Every 6 hours PRN   albuterol (VENTOLIN HFA) 108 (90 Base) MCG/ACT inhaler 1-2 puffs, Inhalation, Every 6 hours PRN   amLODipine (NORVASC) 10 mg, Oral, Daily, Please schedule appointment for further refills. 1st attempt.   apixaban (ELIQUIS) 5 mg, Oral, 2 times daily   atorvastatin (LIPITOR) 20 mg, Oral, Daily-1800    Carboxymethylcellul-Glycerin (LUBRICATING EYE DROPS OP) 1 drop, Both Eyes, Daily PRN   citalopram (CELEXA) 20 mg, Oral, Every morning   Fluticasone Furoate 50 MCG/ACT AEPB 1 puff, Inhalation, Every morning, ARNUITY ELLIPTA   hydrochlorothiazide (HYDRODIURIL) 25 mg, Oral, Every morning   loratadine (CLARITIN) 10 mg, Oral, Daily PRN   losartan (COZAAR) 100 mg, Oral, Every evening   Multiple Vitamin (MULTIVITAMIN) tablet 1 tablet, Oral, Every morning   omeprazole (PRILOSEC) 20 mg, Oral, Daily before breakfast   tolterodine (DETROL) 2 mg, Oral, 2 times daily    Social History:  The patient  reports that she has never smoked. She has never used smokeless tobacco. She reports current alcohol use of about 1.0 standard drink of alcohol per week. She reports that she does not use drugs.   Family History:  The patient's family history includes Breast cancer in her maternal aunt; Heart disease in her father and mother; Hypertension in her father and mother.  ROS:  Please see the history of present illness. All other systems are reviewed and otherwise negative.   PHYSICAL EXAM:  VS:  BP 118/60 (BP Location: Left Arm, Patient Position: Sitting, Cuff Size: Large)   Pulse (!) 59   Ht 5\' 2"  (1.575 m)   Wt 177 lb (80.3 kg)   SpO2 98%   BMI 32.37 kg/m  BMI: Body mass index is 32.37 kg/m.   Orthostatic VS for the past 24 hrs (Last 3 readings):  BP- Lying Pulse- Lying BP- Sitting Pulse- Sitting BP- Standing at 0 minutes Pulse- Standing at 0 minutes BP- Standing at 3 minutes Pulse- Standing at 3 minutes  03/23/23 1045 121/62 53 104/61 56 97/59 60 116/68 62    GEN- The patient is well appearing, alert and oriented x 3 today.   Lungs- Clear to ausculation bilaterally, normal work of breathing.  Heart- Regular, bradycardic rate and rhythm, no murmurs, rubs or gallops Extremities- No peripheral edema, warm, dry   EKG is ordered. Personal review of EKG from today shows:  SB at 59bpm  Recent  Labs: 03/12/2023: BUN 24; Creatinine, Ser 1.10; Hemoglobin 14.3; Potassium 4.1; Sodium 141  No results found for requested labs within last 365 days.   Estimated Creatinine Clearance: 40.1 mL/min (A) (by C-G formula based on SCr of 1.1 mg/dL (H)).   Wt Readings from Last 3 Encounters:  03/23/23 177 lb (80.3 kg)  03/16/23 178 lb (80.7 kg)  03/12/23 178 lb (80.7 kg)     Additional studies reviewed include: Previous EP, cardiology notes.   NM myocardial SPECT, 07/18/2019 Pharmacological myocardial perfusion imaging study with no significant  ischemia Normal wall motion, EF estimated at 74% No EKG changes concerning for ischemia at peak stress or in recovery. Low risk scan  AF ablation, 06/13/2019 1. Comprehensive electrophysiologic study. 2. Coronary sinus pacing and recording. 3. Three-dimensional mapping of atrial fibrillation with additional mapping and ablation of a second discrete focus 4. Ablation of atrial fibrillation with additional mapping and ablation of a second discrete focus 5. Intracardiac echocardiography. 6. Transseptal puncture of an  intact septum. 7. Arrhythmia induction with pacing with dobutamine infusion  TTE, 12/18/2018  1. The left ventricle has normal systolic function of 60-65%. The cavity size is normal. There is no increased left ventricular wall thickness. Left ventricular diastology could not be evaluated secondary to atrial fibrillation.  2. The right ventricle has normal systolic function. The cavity in normal in size. There is no increase in right ventricular wall thickness.  3. Mildly dilated left atrial size.  4. The mitral valve is normal in structure There is mild thickening.  5. The aortic valve is tricuspid. There is mild thickening of the aortic valve.  6. Normal LV systolic function; mild TR.   ASSESSMENT AND PLAN:  #) orthostatic hypotension #) Dizziness, fatigue Positive for orthosttic hypotension in clinic, but BP recovered quickly Will  decrease losartan to 50mg  nightly Cont to monitor BP daily and symptoms for any improvement or worsening Discussed permissive hypertension of systolics to 130-140s   #) parox afib #) bradycardia S/p AF ablation 05/2019 Recently had AF recurrence, now s/p DCCV  She has felt dizzy, SOB, foggy-headed since DCCV Stopped flecainide, last dose 5/1 Wearing zio monitor now Reschedule previously-ordered echo to next available   #) Hypercoag d/t AFib CHA2DS2-VASc Score = 6 [CHF History: 0, HTN History: 1, Diabetes History: 0, Stroke History: 2, Vascular Disease History: 0, Age Score: 2, Gender Score: 1].  Therefore, the patient's annual risk of stroke is 9.7 %. OAC-Eliquis 5 mg twice daily, appropriately dosed No bleeding concerns      Current medicines are reviewed at length with the patient today.   The patient does not have concerns regarding her medicines.  The following changes were made today:   DECREASE losartan to 50mg  nightly  Labs/ tests ordered today include:  Orders Placed This Encounter  Procedures   CBC   Comp Met (CMET)   TSH   EKG 12-Lead     Disposition: Follow up with EP APP in  4-6 weeks    Signed, Sherie Don, NP  03/23/23  10:49 AM  Electrophysiology [facility]

## 2023-04-19 ENCOUNTER — Other Ambulatory Visit: Payer: Medicare PPO

## 2023-04-27 ENCOUNTER — Ambulatory Visit: Payer: Medicare PPO | Admitting: Cardiology

## 2023-05-08 ENCOUNTER — Ambulatory Visit: Payer: Medicare PPO | Admitting: Cardiology

## 2023-05-08 ENCOUNTER — Telehealth: Payer: Self-pay

## 2023-05-08 NOTE — Telephone Encounter (Signed)
-----   Message from Sherie Don, NP sent at 05/07/2023  9:16 PM EDT ----- Regarding: Pls call patient Tuesday AM Hey - Am prepping clinic for tomorrow and saw this patient on my schedule. It was a follow-up from last appt with me, but then she had a zio result and when you called her to give results, she stated that she was feeling much better. Could you call her? If she is continuing to feel good, she does not need to see me Tuesday. If she has questoins/concerns/any issues at all, I am happy to see her. Just don't want her to feel like it's a wasted visit.    Thank you! [employer]

## 2023-05-08 NOTE — Telephone Encounter (Signed)
Spoke with patient who states she is feeling fine and agrees that she does not need to keep this appointment today. Appointment will be cancelled.

## 2023-05-10 ENCOUNTER — Telehealth: Payer: Self-pay

## 2023-05-10 DIAGNOSIS — I4819 Other persistent atrial fibrillation: Secondary | ICD-10-CM

## 2023-05-10 NOTE — Telephone Encounter (Signed)
Pt scheduled for Afib Ablation with Dr. Elberta Fortis on 06/21/23 at 7:30 AM. She will have labs done on 7/17 while here for f/u with [facility].   Pt aware that we will go over Instructions at the time of her visit on 7/17.Marland KitchenMarland Kitchen

## 2023-05-22 ENCOUNTER — Other Ambulatory Visit: Payer: Self-pay | Admitting: Family Medicine

## 2023-05-22 ENCOUNTER — Ambulatory Visit
Admission: RE | Admit: 2023-05-22 | Discharge: 2023-05-22 | Disposition: A | Discharge status: Home / Self Care | Payer: Medicare PPO | Source: Ambulatory Visit | Attending: Family Medicine | Admitting: Family Medicine

## 2023-05-22 DIAGNOSIS — Z1231 Encounter for screening mammogram for malignant neoplasm of breast: Secondary | ICD-10-CM | POA: Diagnosis present

## 2023-05-30 ENCOUNTER — Ambulatory Visit: Payer: Medicare PPO | Attending: Cardiology | Admitting: Cardiology

## 2023-05-30 ENCOUNTER — Encounter: Payer: Self-pay | Admitting: Cardiology

## 2023-05-30 VITALS — BP 122/68 | HR 91 | Ht 62.0 in | Wt 180.4 lb

## 2023-05-30 DIAGNOSIS — D6869 Other thrombophilia: Secondary | ICD-10-CM

## 2023-05-30 DIAGNOSIS — I4819 Other persistent atrial fibrillation: Secondary | ICD-10-CM

## 2023-05-30 DIAGNOSIS — I10 Essential (primary) hypertension: Secondary | ICD-10-CM

## 2023-05-30 DIAGNOSIS — Z01812 Encounter for preprocedural laboratory examination: Secondary | ICD-10-CM

## 2023-05-30 NOTE — Progress Notes (Signed)
  Electrophysiology Office Note:   Date:  05/30/2023  ID:  GRISELDA BRAMBLETT, DOB 02/22/1941, MRN [id number]  Primary Cardiologist: Yvonne Kendall, MD Electrophysiologist: Regan Lemming, MD      History of Present Illness:   Emma Rivera is a 82 y.o. female with h/o atrial fibrillation, hypertension, diabetes, CVA seen today for routine electrophysiology followup.  Since last being seen in our clinic the patient reports increased fatigue and mild shortness of breath.  She is unfortunately on back into atrial fibrillation since her cardioversion.  She is able to do her daily activities, though has to do them more slowly.  she denies chest pain, palpitations, dyspnea, PND, orthopnea, nausea, vomiting, dizziness, syncope, edema, weight gain, or early satiety.   Review of systems complete and found to be negative unless listed in HPI.   EP Information / Studies Reviewed:    EKG is ordered today. Personal review as below.  EKG Interpretation Date/Time:  Wednesday May 30 2023 16:25:07 EDT Ventricular Rate:  91 PR Interval:    QRS Duration:  84 QT Interval:  338 QTC Calculation: 415 R Axis:   78  Text Interpretation: Atrial fibrillation Nonspecific T wave abnormality When compared with ECG of 12-Mar-2023 10:49, Atrial fibrillation has replaced Sinus rhythm Confirmed by Thijs Brunton ([id number]) on 05/30/2023 4:37:45 PM    Risk Assessment/Calculations:    CHA2DS2-VASc Score = 6   This indicates a 9.7% annual risk of stroke. The patient's score is based upon: CHF History: 0 HTN History: 1 Diabetes History: 0 Stroke History: 2 Vascular Disease History: 0 Age Score: 2 Gender Score: 1             Physical Exam:   VS:  BP 122/68   Pulse 91   Ht 5\' 2"  (1.575 m)   Wt 180 lb 6.4 oz (81.8 kg)   SpO2 96%   BMI 33.00 kg/m    Wt Readings from Last 3 Encounters:  05/30/23 180 lb 6.4 oz (81.8 kg)  03/23/23 177 lb (80.3 kg)  03/16/23 178 lb (80.7 kg)     GEN: Well nourished, well  developed in no acute distress NECK: No JVD; No carotid bruits CARDIAC: Irregularly irregular rate and rhythm, no murmurs, rubs, gallops RESPIRATORY:  Clear to auscultation without rales, wheezing or rhonchi  ABDOMEN: Soft, non-tender, non-distended EXTREMITIES:  No edema; No deformity   ASSESSMENT AND PLAN:    1.  Persistent atrial fibrillation: Post ablation 06/13/2019.  Currently on Eliquis.  Unfortunately has gone back into atrial fibrillation.  Has plans for repeat ablation 06/21/2023.  All questions were answered today.  2.  Hypertension: well controlled  3.  Obesity: Lifestyle modification encouraged  4.  Secondary hypercoagulable state: Currently on Eliquis for atrial fibrillation  Follow up with Dr. Elberta Fortis as usual post procedure  Signed, Cambreigh Dearing Jorja Loa, MD

## 2023-05-30 NOTE — H&P (View-Only) (Signed)
  Electrophysiology Office Note:   Date:  05/30/2023  ID:  Emma Rivera, DOB 02/22/1941, MRN [id number]  Primary Cardiologist: Yvonne Kendall, MD Electrophysiologist: Regan Lemming, MD      History of Present Illness:   Emma Rivera is a 82 y.o. female with h/o atrial fibrillation, hypertension, diabetes, CVA seen today for routine electrophysiology followup.  Since last being seen in our clinic the patient reports increased fatigue and mild shortness of breath.  She is unfortunately on back into atrial fibrillation since her cardioversion.  She is able to do her daily activities, though has to do them more slowly.  she denies chest pain, palpitations, dyspnea, PND, orthopnea, nausea, vomiting, dizziness, syncope, edema, weight gain, or early satiety.   Review of systems complete and found to be negative unless listed in HPI.   EP Information / Studies Reviewed:    EKG is ordered today. Personal review as below.  EKG Interpretation Date/Time:  Wednesday May 30 2023 16:25:07 EDT Ventricular Rate:  91 PR Interval:    QRS Duration:  84 QT Interval:  338 QTC Calculation: 415 R Axis:   78  Text Interpretation: Atrial fibrillation Nonspecific T wave abnormality When compared with ECG of 12-Mar-2023 10:49, Atrial fibrillation has replaced Sinus rhythm Confirmed by ,  ([id number]) on 05/30/2023 4:37:45 PM    Risk Assessment/Calculations:    CHA2DS2-VASc Score = 6   This indicates a 9.7% annual risk of stroke. The patient's score is based upon: CHF History: 0 HTN History: 1 Diabetes History: 0 Stroke History: 2 Vascular Disease History: 0 Age Score: 2 Gender Score: 1             Physical Exam:   VS:  BP 122/68   Pulse 91   Ht 5\' 2"  (1.575 m)   Wt 180 lb 6.4 oz (81.8 kg)   SpO2 96%   BMI 33.00 kg/m    Wt Readings from Last 3 Encounters:  05/30/23 180 lb 6.4 oz (81.8 kg)  03/23/23 177 lb (80.3 kg)  03/16/23 178 lb (80.7 kg)     GEN: Well nourished, well  developed in no acute distress NECK: No JVD; No carotid bruits CARDIAC: Irregularly irregular rate and rhythm, no murmurs, rubs, gallops RESPIRATORY:  Clear to auscultation without rales, wheezing or rhonchi  ABDOMEN: Soft, non-tender, non-distended EXTREMITIES:  No edema; No deformity   ASSESSMENT AND PLAN:    1.  Persistent atrial fibrillation: Post ablation 06/13/2019.  Currently on Eliquis.  Unfortunately has gone back into atrial fibrillation.  Has plans for repeat ablation 06/21/2023.  All questions were answered today.  2.  Hypertension: well controlled  3.  Obesity: Lifestyle modification encouraged  4.  Secondary hypercoagulable state: Currently on Eliquis for atrial fibrillation  Follow up with Dr. Elberta Fortis as usual post procedure  Signed,  Jorja Loa, MD

## 2023-06-01 ENCOUNTER — Other Ambulatory Visit
Admission: RE | Admit: 2023-06-01 | Discharge: 2023-06-01 | Disposition: A | Discharge status: Home / Self Care | Payer: Medicare PPO | Source: Ambulatory Visit | Attending: Cardiology | Admitting: Cardiology

## 2023-06-01 DIAGNOSIS — I4819 Other persistent atrial fibrillation: Secondary | ICD-10-CM | POA: Insufficient documentation

## 2023-06-01 DIAGNOSIS — Z01812 Encounter for preprocedural laboratory examination: Secondary | ICD-10-CM | POA: Diagnosis present

## 2023-06-01 LAB — CBC
HCT: 39.3 % (ref 36.0–46.0)
Hemoglobin: 13.1 g/dL (ref 12.0–15.0)
MCH: 32.3 pg (ref 26.0–34.0)
MCHC: 33.3 g/dL (ref 30.0–36.0)
MCV: 97 fL (ref 80.0–100.0)
Platelets: 242 10*3/uL (ref 150–400)
RBC: 4.05 MIL/uL (ref 3.87–5.11)
RDW: 14.1 % (ref 11.5–15.5)
WBC: 5.4 10*3/uL (ref 4.0–10.5)
nRBC: 0 % (ref 0.0–0.2)

## 2023-06-01 LAB — BASIC METABOLIC PANEL
Anion gap: 9 (ref 5–15)
BUN: 17 mg/dL (ref 8–23)
CO2: 27 mmol/L (ref 22–32)
Calcium: 8.7 mg/dL — ABNORMAL LOW (ref 8.9–10.3)
Chloride: 104 mmol/L (ref 98–111)
Creatinine, Ser: 1.02 mg/dL — ABNORMAL HIGH (ref 0.44–1.00)
GFR, Estimated: 56 mL/min — ABNORMAL LOW (ref >60)
Glucose, Bld: 94 mg/dL (ref 70–99)
Potassium: 4.2 mmol/L (ref 3.5–5.1)
Sodium: 140 mmol/L (ref 135–145)

## 2023-06-12 ENCOUNTER — Telehealth (HOSPITAL_COMMUNITY): Payer: Self-pay | Admitting: *Deleted

## 2023-06-12 NOTE — Telephone Encounter (Signed)
Attempted to call patient regarding upcoming cardiac CT appointment. °Left message on voicemail with name and callback number ° °Merle Prescott RN Navigator Cardiac Imaging °[facility] °[phone] Office °[phone] Cell ° °

## 2023-06-13 ENCOUNTER — Ambulatory Visit (HOSPITAL_COMMUNITY)
Admission: RE | Admit: 2023-06-13 | Discharge: 2023-06-13 | Disposition: A | Discharge status: Home / Self Care | Payer: Medicare PPO | Source: Ambulatory Visit | Attending: Cardiology | Admitting: Cardiology

## 2023-06-13 DIAGNOSIS — I4819 Other persistent atrial fibrillation: Secondary | ICD-10-CM | POA: Diagnosis present

## 2023-06-13 MED ORDER — IOHEXOL 350 MG/ML SOLN
100.0000 mL | Freq: Once | INTRAVENOUS | Status: AC | PRN
  Administered 2023-06-13: 100 mL via INTRAVENOUS

## 2023-06-20 NOTE — Anesthesia Preprocedure Evaluation (Signed)
Anesthesia Evaluation  Patient identified by MRN, date of birth, ID band Patient awake    Reviewed: Allergy & Precautions, NPO status , Patient's Chart, lab work & pertinent test results  Airway Mallampati: III  TM Distance: >3 FB Neck ROM: Full    Dental  (+) Teeth Intact, Dental Advisory Given   Pulmonary asthma    Pulmonary exam normal breath sounds clear to auscultation       Cardiovascular hypertension, Pt. on medications + angina  Normal cardiovascular exam+ dysrhythmias Atrial Fibrillation  Rhythm:Regular Rate:Normal     Neuro/Psych  PSYCHIATRIC DISORDERS  Depression    CVA, No Residual Symptoms    GI/Hepatic Neg liver ROS,GERD  Medicated,,  Endo/Other  negative endocrine ROS    Renal/GU negative Renal ROS     Musculoskeletal negative musculoskeletal ROS (+)    Abdominal   Peds  Hematology  (+) Blood dyscrasia (Eliquis)   Anesthesia Other Findings   Reproductive/Obstetrics                             Anesthesia Physical Anesthesia Plan  ASA: 3  Anesthesia Plan: General   Post-op Pain Management: Tylenol PO (pre-op)*   Induction: Intravenous  PONV Risk Score and Plan: 3 and Dexamethasone and Ondansetron  Airway Management Planned: Oral ETT  Additional Equipment:   Intra-op Plan:   Post-operative Plan: Extubation in OR  Informed Consent: I have reviewed the patients History and Physical, chart, labs and discussed the procedure including the risks, benefits and alternatives for the proposed anesthesia with the patient or authorized representative who has indicated his/her understanding and acceptance.     Dental advisory given  Plan Discussed with: CRNA  Anesthesia Plan Comments:         Anesthesia Quick Evaluation

## 2023-06-20 NOTE — Pre-Procedure Instructions (Signed)
Instructed patient on the following items: Arrival time 0515 Nothing to eat or drink after midnight No meds AM of procedure Responsible person to drive you home and stay with you for 24 hrs  Have you missed any doses of anti-coagulant Eliquis- takes twice a day, hasn't missed any doses.  Don't take dose in the morning.

## 2023-06-21 ENCOUNTER — Other Ambulatory Visit: Payer: Self-pay

## 2023-06-21 ENCOUNTER — Ambulatory Visit (HOSPITAL_BASED_OUTPATIENT_CLINIC_OR_DEPARTMENT_OTHER): Payer: Medicare PPO | Admitting: Anesthesiology

## 2023-06-21 ENCOUNTER — Ambulatory Visit (HOSPITAL_COMMUNITY): Payer: Medicare PPO | Admitting: Anesthesiology

## 2023-06-21 ENCOUNTER — Ambulatory Visit (HOSPITAL_COMMUNITY)
Admission: RE | Admit: 2023-06-21 | Discharge: 2023-06-21 | Disposition: A | Discharge status: Home / Self Care | Payer: Medicare PPO | Attending: Cardiology | Admitting: Cardiology

## 2023-06-21 ENCOUNTER — Encounter (HOSPITAL_COMMUNITY)
Admission: RE | Disposition: A | Discharge status: Home / Self Care | Payer: Self-pay | Source: Home / Self Care | Attending: Cardiology

## 2023-06-21 DIAGNOSIS — Z7901 Long term (current) use of anticoagulants: Secondary | ICD-10-CM | POA: Insufficient documentation

## 2023-06-21 DIAGNOSIS — J45909 Unspecified asthma, uncomplicated: Secondary | ICD-10-CM | POA: Diagnosis not present

## 2023-06-21 DIAGNOSIS — I4819 Other persistent atrial fibrillation: Secondary | ICD-10-CM

## 2023-06-21 DIAGNOSIS — I509 Heart failure, unspecified: Secondary | ICD-10-CM | POA: Insufficient documentation

## 2023-06-21 DIAGNOSIS — I10 Essential (primary) hypertension: Secondary | ICD-10-CM

## 2023-06-21 DIAGNOSIS — D6869 Other thrombophilia: Secondary | ICD-10-CM | POA: Diagnosis not present

## 2023-06-21 DIAGNOSIS — Z6833 Body mass index (BMI) 33.0-33.9, adult: Secondary | ICD-10-CM | POA: Diagnosis not present

## 2023-06-21 DIAGNOSIS — E669 Obesity, unspecified: Secondary | ICD-10-CM | POA: Diagnosis not present

## 2023-06-21 DIAGNOSIS — I110 Hypertensive heart disease with heart failure: Secondary | ICD-10-CM | POA: Diagnosis not present

## 2023-06-21 DIAGNOSIS — E785 Hyperlipidemia, unspecified: Secondary | ICD-10-CM | POA: Diagnosis not present

## 2023-06-21 HISTORY — PX: ATRIAL FIBRILLATION ABLATION: EP1191

## 2023-06-21 LAB — POCT ACTIVATED CLOTTING TIME: Activated Clotting Time: 348 seconds

## 2023-06-21 SURGERY — ATRIAL FIBRILLATION ABLATION
Anesthesia: General

## 2023-06-21 MED ORDER — HEPARIN (PORCINE) IN NACL 1000-0.9 UT/500ML-% IV SOLN
INTRAVENOUS | Status: DC | PRN
  Administered 2023-06-21 (×4): 500 mL

## 2023-06-21 MED ORDER — ACETAMINOPHEN 500 MG PO TABS
1000.0000 mg | ORAL_TABLET | Freq: Once | ORAL | Status: AC
  Administered 2023-06-21: 1000 mg via ORAL
  Filled 2023-06-21: qty 2

## 2023-06-21 MED ORDER — ACETAMINOPHEN 325 MG PO TABS
ORAL_TABLET | ORAL | Status: AC
  Filled 2023-06-21: qty 2

## 2023-06-21 MED ORDER — PHENYLEPHRINE HCL-NACL 20-0.9 MG/250ML-% IV SOLN
INTRAVENOUS | Status: DC | PRN
  Administered 2023-06-21: 30 ug/min via INTRAVENOUS

## 2023-06-21 MED ORDER — ROCURONIUM BROMIDE 10 MG/ML (PF) SYRINGE
PREFILLED_SYRINGE | INTRAVENOUS | Status: DC | PRN
  Administered 2023-06-21: 70 mg via INTRAVENOUS

## 2023-06-21 MED ORDER — SUGAMMADEX SODIUM 200 MG/2ML IV SOLN
INTRAVENOUS | Status: DC | PRN
  Administered 2023-06-21 (×2): 100 mg via INTRAVENOUS

## 2023-06-21 MED ORDER — ACETAMINOPHEN 325 MG PO TABS: 650.0000 mg | ORAL_TABLET | ORAL | Status: DC | PRN

## 2023-06-21 MED ORDER — ACETAMINOPHEN 325 MG PO TABS
650.0000 mg | ORAL_TABLET | ORAL | Status: DC | PRN
  Administered 2023-06-21: 650 mg via ORAL

## 2023-06-21 MED ORDER — DEXAMETHASONE SODIUM PHOSPHATE 10 MG/ML IJ SOLN
INTRAMUSCULAR | Status: DC | PRN
  Administered 2023-06-21: 5 mg via INTRAVENOUS

## 2023-06-21 MED ORDER — ONDANSETRON HCL 4 MG/2ML IJ SOLN: 4.0000 mg | Freq: Four times a day (QID) | INTRAMUSCULAR | Status: DC | PRN

## 2023-06-21 MED ORDER — HEPARIN SODIUM (PORCINE) 1000 UNIT/ML IJ SOLN
INTRAMUSCULAR | Status: DC | PRN
  Administered 2023-06-21: 1000 [IU] via INTRAVENOUS

## 2023-06-21 MED ORDER — DOBUTAMINE INFUSION FOR EP/ECHO/NUC (1000 MCG/ML)
INTRAVENOUS | Status: AC
  Filled 2023-06-21: qty 250

## 2023-06-21 MED ORDER — PROTAMINE SULFATE 10 MG/ML IV SOLN
INTRAVENOUS | Status: DC | PRN
  Administered 2023-06-21: 40 mg via INTRAVENOUS

## 2023-06-21 MED ORDER — DOBUTAMINE INFUSION FOR EP/ECHO/NUC (1000 MCG/ML)
INTRAVENOUS | Status: DC | PRN
  Administered 2023-06-21: 20 ug/kg/min via INTRAVENOUS

## 2023-06-21 MED ORDER — SODIUM CHLORIDE 0.9 % IV SOLN: INTRAVENOUS | Status: DC

## 2023-06-21 MED ORDER — LIDOCAINE 2% (20 MG/ML) 5 ML SYRINGE
INTRAMUSCULAR | Status: DC | PRN
  Administered 2023-06-21: 80 mg via INTRAVENOUS

## 2023-06-21 MED ORDER — HEPARIN SODIUM (PORCINE) 1000 UNIT/ML IJ SOLN
INTRAMUSCULAR | Status: DC | PRN
  Administered 2023-06-21: 14000 [IU] via INTRAVENOUS

## 2023-06-21 MED ORDER — FENTANYL CITRATE (PF) 100 MCG/2ML IJ SOLN
INTRAMUSCULAR | Status: DC | PRN
  Administered 2023-06-21 (×2): 50 ug via INTRAVENOUS

## 2023-06-21 MED ORDER — HEPARIN SODIUM (PORCINE) 1000 UNIT/ML IJ SOLN
INTRAMUSCULAR | Status: AC
  Filled 2023-06-21: qty 10

## 2023-06-21 MED ORDER — PROPOFOL 10 MG/ML IV BOLUS
INTRAVENOUS | Status: DC | PRN
  Administered 2023-06-21: 150 mg via INTRAVENOUS

## 2023-06-21 MED ORDER — ONDANSETRON HCL 4 MG/2ML IJ SOLN
INTRAMUSCULAR | Status: DC | PRN
  Administered 2023-06-21: 4 mg via INTRAVENOUS

## 2023-06-21 SURGICAL SUPPLY — 21 items
BAG SNAP BAND KOVER 36X36 (MISCELLANEOUS) IMPLANT
CATH 8FR REPROCESSED SOUNDSTAR (CATHETERS) ×1 IMPLANT
CATH 8FR SOUNDSTAR REPROCESSED (CATHETERS) IMPLANT
CATH ABLAT QDOT MICRO BI TC DF (CATHETERS) IMPLANT
CATH OCTARAY 2.0 F 3-3-3-3-3 (CATHETERS) IMPLANT
CATH PIGTAIL STEERABLE D1 8.7 (WIRE) IMPLANT
CATH S-M CIRCA TEMP PROBE (CATHETERS) IMPLANT
CATH WEBSTER BI DIR CS D-F CRV (CATHETERS) IMPLANT
CLOSURE MYNX CONTROL 6F/7F (Vascular Products) IMPLANT
CLOSURE PERCLOSE PROSTYLE (VASCULAR PRODUCTS) IMPLANT
COVER SWIFTLINK CONNECTOR (BAG) ×1 IMPLANT
PACK EP LATEX FREE (CUSTOM PROCEDURE TRAY) ×1
PACK EP LF (CUSTOM PROCEDURE TRAY) ×1 IMPLANT
PAD DEFIB RADIO PHYSIO CONN (PAD) ×1 IMPLANT
PATCH CARTO3 (PAD) IMPLANT
SHEATH CARTO VIZIGO SM CVD (SHEATH) IMPLANT
SHEATH PINNACLE 7F 10CM (SHEATH) IMPLANT
SHEATH PINNACLE 8F 10CM (SHEATH) IMPLANT
SHEATH PINNACLE 9F 10CM (SHEATH) IMPLANT
SHEATH PROBE COVER 6X72 (BAG) IMPLANT
TUBING SMART ABLATE COOLFLOW (TUBING) IMPLANT

## 2023-06-21 NOTE — Anesthesia Postprocedure Evaluation (Signed)
Anesthesia Post Note  Patient: Emma Rivera  Procedure(s) Performed: ATRIAL FIBRILLATION ABLATION     Patient location during evaluation: PACU Anesthesia Type: General Level of consciousness: awake and alert Pain management: pain level controlled Vital Signs Assessment: post-procedure vital signs reviewed and stable Respiratory status: spontaneous breathing, nonlabored ventilation, respiratory function stable and patient connected to nasal cannula oxygen Cardiovascular status: blood pressure returned to baseline and stable Postop Assessment: no apparent nausea or vomiting Anesthetic complications: no   There were no known notable events for this encounter.  Last Vitals:  Vitals:   06/21/23 1045 06/21/23 1100  BP: (!) 149/82 (!) 142/69  Pulse: 61 61  Resp: 11 12  Temp:    SpO2: 99% 97%    Last Pain:  Vitals:   06/21/23 1005  TempSrc:   PainSc: 0-No pain                 Collene Schlichter

## 2023-06-21 NOTE — Transfer of Care (Signed)
Immediate Anesthesia Transfer of Care Note  Patient: Emma Rivera  Procedure(s) Performed: ATRIAL FIBRILLATION ABLATION  Patient Location: Cath Lab  Anesthesia Type:General  Level of Consciousness: drowsy and patient cooperative  Airway & Oxygen Therapy: Patient Spontanous Breathing and Patient connected to nasal cannula oxygen  Post-op Assessment: Report given to RN and Post -op Vital signs reviewed and stable  Post vital signs: Reviewed and stable  Last Vitals:  Vitals Value Taken Time  BP 131/69 06/21/23 0930  Temp    Pulse 66 06/21/23 0932  Resp 11 06/21/23 0932  SpO2 98 % 06/21/23 0932  Vitals shown include unfiled device data.  Last Pain:  Vitals:   06/21/23 0535  TempSrc: Temporal         Complications: There were no known notable events for this encounter.

## 2023-06-21 NOTE — Anesthesia Procedure Notes (Signed)
Procedure Name: Intubation Date/Time: 06/21/2023 7:51 AM  Performed by: Audie Pinto, CRNAPre-anesthesia Checklist: Patient identified, Emergency Drugs available, Suction available and Patient being monitored Patient Re-evaluated:Patient Re-evaluated prior to induction Oxygen Delivery Method: Circle system utilized Preoxygenation: Pre-oxygenation with 100% oxygen Induction Type: IV induction Ventilation: Mask ventilation without difficulty Laryngoscope Size: Mac and 4 Grade View: Grade I Tube type: Oral Tube size: 7.0 mm Number of attempts: 1 Airway Equipment and Method: Stylet and Oral airway Placement Confirmation: ETT inserted through vocal cords under direct vision, positive ETCO2 and breath sounds checked- equal and bilateral Secured at: 21 cm Tube secured with: Tape Dental Injury: Teeth and Oropharynx as per pre-operative assessment

## 2023-06-21 NOTE — Discharge Instructions (Signed)

## 2023-06-21 NOTE — Interval H&P Note (Signed)
History and Physical Interval Note:  06/21/2023 7:03 AM  Emma Rivera  has presented today for surgery, with the diagnosis of afib.  The various methods of treatment have been discussed with the patient and family. After consideration of risks, benefits and other options for treatment, the patient has consented to  Procedure(s): ATRIAL FIBRILLATION ABLATION (N/A) as a surgical intervention.  The patient's history has been reviewed, patient examined, no change in status, stable for surgery.  I have reviewed the patient's chart and labs.  Questions were answered to the patient's satisfaction.      [employer]

## 2023-06-22 ENCOUNTER — Encounter (HOSPITAL_COMMUNITY): Payer: Self-pay | Admitting: Cardiology

## 2023-07-12 ENCOUNTER — Other Ambulatory Visit: Payer: Self-pay | Admitting: Physician Assistant

## 2023-07-12 DIAGNOSIS — M2392 Unspecified internal derangement of left knee: Secondary | ICD-10-CM

## 2023-07-19 ENCOUNTER — Ambulatory Visit (HOSPITAL_COMMUNITY)
Admission: RE | Admit: 2023-07-19 | Discharge: 2023-07-19 | Disposition: A | Discharge status: Home / Self Care | Payer: Medicare PPO | Source: Ambulatory Visit | Attending: Physician Assistant | Admitting: Physician Assistant

## 2023-07-19 ENCOUNTER — Encounter (HOSPITAL_COMMUNITY): Payer: Self-pay | Admitting: Physician Assistant

## 2023-07-19 VITALS — BP 126/74 | HR 58 | Ht 62.0 in | Wt 180.2 lb

## 2023-07-19 DIAGNOSIS — D6869 Other thrombophilia: Secondary | ICD-10-CM | POA: Diagnosis not present

## 2023-07-19 DIAGNOSIS — Z8673 Personal history of transient ischemic attack (TIA), and cerebral infarction without residual deficits: Secondary | ICD-10-CM | POA: Diagnosis not present

## 2023-07-19 DIAGNOSIS — I4819 Other persistent atrial fibrillation: Secondary | ICD-10-CM | POA: Diagnosis present

## 2023-07-19 DIAGNOSIS — Z7901 Long term (current) use of anticoagulants: Secondary | ICD-10-CM | POA: Diagnosis not present

## 2023-07-19 DIAGNOSIS — I10 Essential (primary) hypertension: Secondary | ICD-10-CM | POA: Insufficient documentation

## 2023-07-19 NOTE — Progress Notes (Signed)
Primary Care Physician: [name], MD Primary Cardiologist: Yvonne Kendall, MD Electrophysiologist: Will Jorja Loa, MD  Referring Physician: Dr Darnelle Bos is a 82 y.o. female with a history of HTN, CVA, esophageal spasm, atrial fibrillation who presents for follow up in the [facility]. Patient is on Eliquis for a CHADS2VASC score of 6. She is s/p AF ablation 05/2019. She saw Dr. Elberta Fortis 02/2023 feeling weak, fatigued, and SOB. Found to be in AFib. She was started on flecainide and underwent DCCV on 03/12/23. She discontinued the flecainide due to dizziness, losartan was also decreased which helped. She is s/p repeat ablation with Dr Elberta Fortis on 06/21/23.  On follow up today, patient reports that she has done well since her ablation. She remains in SR. She denies chest pain, swallowing pain, or groin issues. She does become SOB and her arms feel heavy when walking a long distance. This has been chronic and predates her ablation. CAC score of 0 on CT prior to ablation.   Today, she denies symptoms of palpitations, chest pain, orthopnea, PND, lower extremity edema, dizziness, presyncope, syncope, snoring, daytime somnolence, bleeding, or neurologic sequela. The patient is tolerating medications without difficulties and is otherwise without complaint today.    Atrial Fibrillation Risk Factors:  she does not have symptoms or diagnosis of sleep apnea. she does not have a history of rheumatic fever.   Atrial Fibrillation Management history:  Previous antiarrhythmic drugs: flecainide  Previous cardioversions: 04/01/19, 03/12/23 Previous ablations: 06/13/19, 06/21/23 Anticoagulation history: Eliquis  ROS- All systems are reviewed and negative except as per the HPI above.  Past Medical History:  Diagnosis Date   Atrial fibrillation (HCC)    Depression    Esophageal spasm    Esophageal spasm    GERD (gastroesophageal reflux disease)    Heart  murmur    HOH (hard of hearing)    Hypertension    Pre-diabetes    Stroke [facility])     Current Outpatient Medications  Medication Sig Dispense Refill   acetaminophen (TYLENOL) 500 MG tablet Take 1,000 mg by mouth every 6 (six) hours as needed for moderate pain or headache.     albuterol (VENTOLIN HFA) 108 (90 Base) MCG/ACT inhaler Inhale 1-2 puffs into the lungs every 6 (six) hours as needed (exercise/wheezing/shortness of breath.).     amLODipine (NORVASC) 10 MG tablet Take 1 tablet (10 mg total) by mouth daily. Please schedule appointment for further refills. 1st attempt. 30 tablet 1   apixaban (ELIQUIS) 5 MG TABS tablet Take 1 tablet (5 mg total) by mouth 2 (two) times daily. 60 tablet 0   atorvastatin (LIPITOR) 20 MG tablet TAKE 1 TABLET (20 MG TOTAL) BY MOUTH DAILY AT 6 PM. 90 tablet 2   Carboxymethylcellul-Glycerin (LUBRICATING EYE DROPS OP) Place 1 drop into both eyes daily as needed (dry eyes).     citalopram (CELEXA) 20 MG tablet Take 20 mg by mouth in the morning.     Fluticasone Furoate 50 MCG/ACT AEPB Inhale 1 puff into the lungs in the morning. ARNUITY ELLIPTA     hydrochlorothiazide (HYDRODIURIL) 25 MG tablet Take 25 mg by mouth in the morning.     loratadine (CLARITIN) 10 MG tablet Take 10 mg by mouth daily as needed for allergies.     losartan (COZAAR) 50 MG tablet Take 1 tablet (50 mg total) by mouth daily. 90 tablet 3   Multiple Vitamin (MULTIVITAMIN) tablet Take 1 tablet by mouth in the  morning.     omeprazole (PRILOSEC) 20 MG capsule Take 20 mg by mouth daily before breakfast.     tolterodine (DETROL) 2 MG tablet Take 2 mg by mouth 2 (two) times daily.     No current facility-administered medications for this encounter.    Physical Exam: BP 126/74   Pulse (!) 58   Ht 5\' 2"  (1.575 m)   Wt 81.7 kg   BMI 32.96 kg/m   GEN: Well nourished, well developed in no acute distress NECK: No JVD; No carotid bruits CARDIAC: Regular rate and rhythm, no murmurs, rubs,  gallops RESPIRATORY:  Clear to auscultation without rales, wheezing or rhonchi  ABDOMEN: Soft, non-tender, non-distended EXTREMITIES:  No edema; No deformity   Wt Readings from Last 3 Encounters:  07/19/23 81.7 kg  06/21/23 81.6 kg  05/30/23 81.8 kg     EKG today demonstrates  SB Vent. rate 58 BPM PR interval 146 ms QRS duration 74 ms QT/QTcB 426/418 ms  Echo 03/23/23 demonstrated   1. Left ventricular ejection fraction, by estimation, is 60 to 65%. The  left ventricle has normal function. The left ventricle has no regional  wall motion abnormalities. Left ventricular diastolic parameters are  consistent with Grade II diastolic dysfunction (pseudonormalization).   2. Right ventricular systolic function is normal. The right ventricular  size is normal. There is normal pulmonary artery systolic pressure.   3. Left atrial size was moderately dilated.   4. Right atrial size was mildly dilated.   5. The mitral valve is normal in structure. No evidence of mitral valve  regurgitation. No evidence of mitral stenosis.   6. The aortic valve is normal in structure. Aortic valve regurgitation is  not visualized. Aortic valve sclerosis/calcification is present, without  any evidence of aortic stenosis.   7. The inferior vena cava is normal in size with greater than 50%  respiratory variability, suggesting right atrial pressure of 3 mmHg.    CHA2DS2-VASc Score = 6  The patient's score is based upon: CHF History: 0 HTN History: 1 Diabetes History: 0 Stroke History: 2 Vascular Disease History: 0 Age Score: 2 Gender Score: 1       ASSESSMENT AND PLAN: Persistent Atrial Fibrillation (ICD10:  I48.19) The patient's CHA2DS2-VASc score is 6, indicating a 9.7% annual risk of stroke.   Did not tolerate flecainide  S/p afib ablation 06/13/19 and repeat ablation 06/21/23 Patient appears to be maintaining SR Continue Eliquis 5 mg BID with no missed doses for 3 months post  ablation.  Secondary Hypercoagulable State (ICD10:  D68.69) The patient is at significant risk for stroke/thromboembolism based upon her CHA2DS2-VASc Score of 6.  Continue Apixaban (Eliquis).   HTN Stable on current regimen   Follow up with Francis Dowse as scheduled.        Jorja Loa PA-C [facility] [address] [place], [place] [address] [phone]

## 2023-08-08 ENCOUNTER — Encounter: Payer: Self-pay | Admitting: Physician Assistant

## 2023-08-10 ENCOUNTER — Ambulatory Visit
Admission: RE | Admit: 2023-08-10 | Discharge: 2023-08-10 | Disposition: A | Discharge status: Home / Self Care | Payer: Medicare PPO | Source: Ambulatory Visit | Attending: Physician Assistant | Admitting: Physician Assistant

## 2023-08-10 DIAGNOSIS — M2392 Unspecified internal derangement of left knee: Secondary | ICD-10-CM

## 2023-09-10 DIAGNOSIS — M1712 Unilateral primary osteoarthritis, left knee: Secondary | ICD-10-CM | POA: Insufficient documentation

## 2023-09-21 ENCOUNTER — Ambulatory Visit: Payer: Medicare PPO | Admitting: Cardiology

## 2023-09-23 NOTE — Progress Notes (Unsigned)
Cardiology Office Note Date:  09/23/2023  Patient ID:  Emma Rivera, Emma Rivera 09-06-1941, MRN [id number] PCP:  [name], MD  Cardiologist:  Dr. Okey Dupre Electrophysiologist: Dr. Elberta Fortis    Chief Complaint:  *** post ablation visit  History of Present Illness: Emma Rivera is a 82 y.o. female with history of HTN, DM, stroke, AFib  She saw Dr. Elberta Fortis 05/30/23, having more Afib, again back in AFib post DCCV, planned for repeat ablation  Ablation 06/21/23  Saw the Afib clinic post procedure as usual, 07/19/23, reported a chronic DOE/arm heaviness with exertion that pre-dated her procedure, noting Ca++ score of zero, maintaining SR No changes were made  *** symptoms *** eliquis, dose, labs, bleeding *** sites  AFib Hx Diagnosed Feb 2020 PVI ablation 06/13/2019 PVI ablation 06/21/2023 AAD Hx Flecainide started April 2020, stopped June 2020 2/2 c/o dizziness and noted low HRs (and BP, BP med also stopped)   Past Medical History:  Diagnosis Date   Atrial fibrillation (HCC)    Depression    Esophageal spasm    Esophageal spasm    GERD (gastroesophageal reflux disease)    Heart murmur    HOH (hard of hearing)    Hypertension    Pre-diabetes    Stroke [facility])     Past Surgical History:  Procedure Laterality Date   ATRIAL FIBRILLATION ABLATION N/A 06/13/2019   Procedure: ATRIAL FIBRILLATION ABLATION;  Surgeon: Regan Lemming, MD;  Location: [facility] INVASIVE CV LAB;  Service: Cardiovascular;  Laterality: N/A;   ATRIAL FIBRILLATION ABLATION N/A 06/21/2023   Procedure: ATRIAL FIBRILLATION ABLATION;  Surgeon: Regan Lemming, MD;  Location: [facility] INVASIVE CV LAB;  Service: Cardiovascular;  Laterality: N/A;   CARDIAC CATHETERIZATION     CARDIOVERSION N/A 04/01/2019   Procedure: CARDIOVERSION;  Surgeon: Lars Masson, MD;  Location: [facility] ENDOSCOPY;  Service: Cardiovascular;  Laterality: N/A;   CARDIOVERSION N/A 03/12/2023   Procedure: CARDIOVERSION;  Surgeon: Meriam Sprague,  MD;  Location: [facility] INVASIVE CV LAB;  Service: Cardiovascular;  Laterality: N/A;   CATARACT EXTRACTION W/PHACO Left 09/09/2015   Procedure: CATARACT EXTRACTION PHACO AND INTRAOCULAR LENS PLACEMENT (IOC);  Surgeon: Lia Hopping, MD;  Location: [facility] ORS;  Service: Ophthalmology;  Laterality: Left;  [place]   1:10.5 AP     8.6 CDE  6.05 casette lot #  [id number]   CATARACT EXTRACTION W/PHACO Right 09/30/2015   Procedure: CATARACT EXTRACTION PHACO AND INTRAOCULAR LENS PLACEMENT (IOC);  Surgeon: Lia Hopping, MD;  Location: [facility] ORS;  Service: Ophthalmology;  Laterality: Right;  [place] 01:00.3 AP%: 11.6 CDE: 7.00 FLUID LOT# [id number]   COLONOSCOPY     COLONOSCOPY WITH PROPOFOL N/A 04/04/2017   Procedure: COLONOSCOPY WITH PROPOFOL;  Surgeon: Scot Jun, MD;  Location: [facility] ENDOSCOPY;  Service: Endoscopy;  Laterality: N/A;   TONSILLECTOMY      Current Outpatient Medications  Medication Sig Dispense Refill   acetaminophen (TYLENOL) 500 MG tablet Take 1,000 mg by mouth every 6 (six) hours as needed for moderate pain or headache.     albuterol (VENTOLIN HFA) 108 (90 Base) MCG/ACT inhaler Inhale 1-2 puffs into the lungs every 6 (six) hours as needed (exercise/wheezing/shortness of breath.).     amLODipine (NORVASC) 10 MG tablet Take 1 tablet (10 mg total) by mouth daily. Please schedule appointment for further refills. 1st attempt. 30 tablet 1   apixaban (ELIQUIS) 5 MG TABS tablet Take 1 tablet (5 mg total) by mouth 2 (two) times daily. 60 tablet 0  atorvastatin (LIPITOR) 20 MG tablet TAKE 1 TABLET (20 MG TOTAL) BY MOUTH DAILY AT 6 PM. 90 tablet 2   Carboxymethylcellul-Glycerin (LUBRICATING EYE DROPS OP) Place 1 drop into both eyes daily as needed (dry eyes).     citalopram (CELEXA) 20 MG tablet Take 20 mg by mouth in the morning.     Fluticasone Furoate 50 MCG/ACT AEPB Inhale 1 puff into the lungs in the morning. ARNUITY ELLIPTA     hydrochlorothiazide (HYDRODIURIL) 25 MG tablet Take 25 mg by mouth  in the morning.     loratadine (CLARITIN) 10 MG tablet Take 10 mg by mouth daily as needed for allergies.     losartan (COZAAR) 50 MG tablet Take 1 tablet (50 mg total) by mouth daily. 90 tablet 3   Multiple Vitamin (MULTIVITAMIN) tablet Take 1 tablet by mouth in the morning.     omeprazole (PRILOSEC) 20 MG capsule Take 20 mg by mouth daily before breakfast.     tolterodine (DETROL) 2 MG tablet Take 2 mg by mouth 2 (two) times daily.     No current facility-administered medications for this visit.    Allergies:   Imdur [isosorbide nitrate] and Sulfa antibiotics   Social History:  The patient  reports that she has never smoked. She has never used smokeless tobacco. She reports current alcohol use of about 1.0 standard drink of alcohol per week. She reports that she does not use drugs.   Family History:  The patient's family history includes Breast cancer in her maternal aunt; Heart disease in her father and mother; Hypertension in her father and mother.  ROS:  Please see the history of present illness.    All other systems are reviewed and otherwise negative.   PHYSICAL EXAM:  VS:  There were no vitals taken for this visit. BMI: There is no height or weight on file to calculate BMI. Well nourished, well developed, in no acute distress HEENT: normocephalic, atraumatic Neck: no JVD, carotid bruits or masses Cardiac:  *** RRR; no significant murmurs, no rubs, or gallops Lungs: *** CTA b/l, no wheezing, rhonchi or rales Abd: soft, nontender MS: no deformity or atrophy Ext: *** no edema Skin: warm and dry, no rash Neuro:  No gross deficits appreciated Psych: euthymic mood, full affect   EKG:  not done today  06/21/2023: EPS, ablation CONCLUSIONS: 1. Atrial fibrillation upon presentation.   2. Successful electrical isolation and anatomical encircling of all four pulmonary veins with radiofrequency current.  A WACA approach was used 3. Additional left atrial ablation was performed with  a standard box lesion created along the posterior wall of the left atrium 4. No early apparent complications.   06/13/23: Cardiac CT IMPRESSION: 1. There is normal pulmonary vein drainage into the left atrium with ostial measurements above. 2. There is no thrombus in the left atrial appendage. 3. The esophagus runs in the left atrial midline and is not in proximity to any of the pulmonary vein ostia. 4. No PFO/ASD. 5. Normal coronary origin. Right dominance. 6. CAC score of 0 which is 0 percentile for age-, race-, and sex-matched controls.    Coronary CTA 03/17/21 IMPRESSION: 1. Normal coronary calcium score of 0. Patient is low risk for coronary events. 2. Normal coronary origin with right dominance. 3. No evidence of CAD. 4. CAD-RADS 0. Consider non-atherosclerotic causes of chest pain.   06/13/2019: EPS/Ablation CONCLUSIONS: 1. Sinus rhythm upon presentation.   2. Successful electrical isolation and anatomical encircling of all four pulmonary veins  with radiofrequency current. 3. No inducible arrhythmias following ablation both on and off of dobutamine 4. No early apparent complications   Echo 12/18/2018  1. The left ventricle has normal systolic function of 60-65%. The cavity  size is normal. There is no increased left ventricular wall thickness.  Left ventricular diastology could not be evaluated secondary to atrial  fibrillation.   2. The right ventricle has normal systolic function. The cavity in normal  in size. There is no increase in right ventricular wall thickness.   3. Mildly dilated left atrial size.   4. The mitral valve is normal in structure There is mild thickening.   5. The aortic valve is tricuspid. There is mild thickening of the aortic  valve.   6. Normal LV systolic function; mild TR.  Recent Labs: 03/23/2023: ALT 101; TSH 5.531 06/01/2023: BUN 17; Creatinine, Ser 1.02; Hemoglobin 13.1; Platelets 242; Potassium 4.2; Sodium 140  No results found for  requested labs within last 365 days.   CrCl cannot be calculated (Patient's most recent lab result is older than the maximum 21 days allowed.).   Wt Readings from Last 3 Encounters:  07/19/23 180 lb 3.2 oz (81.7 kg)  06/21/23 180 lb (81.6 kg)  05/30/23 180 lb 6.4 oz (81.8 kg)     Other studies reviewed: Additional studies/records reviewed today include: summarized above  ASSESSMENT AND PLAN:  Paroxysmal Afib CHA2DS2Vasc is 7, on Eliquis, *** appropriately dosed ***  2. HTN *** No change today  3. Sleep apnea ***  Disposition:   Current medicines are reviewed at length with the patient today.  The patient did not have any concerns regarding medicines.  Norma Fredrickson, PA-C 09/23/2023 8:55 AM     [facility] [address] [place] [address] [phone] (office)  [phone] (fax)

## 2023-09-24 ENCOUNTER — Encounter: Payer: Self-pay | Admitting: Physician Assistant

## 2023-09-24 ENCOUNTER — Ambulatory Visit: Payer: Medicare PPO | Attending: Cardiology | Admitting: Physician Assistant

## 2023-09-24 ENCOUNTER — Ambulatory Visit: Payer: Medicare PPO | Attending: Physician Assistant

## 2023-09-24 VITALS — BP 120/60 | HR 70 | Ht 62.0 in | Wt 181.0 lb

## 2023-09-24 DIAGNOSIS — R002 Palpitations: Secondary | ICD-10-CM

## 2023-09-24 DIAGNOSIS — I10 Essential (primary) hypertension: Secondary | ICD-10-CM | POA: Diagnosis not present

## 2023-09-24 DIAGNOSIS — I4819 Other persistent atrial fibrillation: Secondary | ICD-10-CM | POA: Diagnosis not present

## 2023-09-24 DIAGNOSIS — D6869 Other thrombophilia: Secondary | ICD-10-CM | POA: Diagnosis not present

## 2023-09-24 NOTE — Progress Notes (Unsigned)
Enrolled for [employer] to mail a ZIO XT long term holter monitor to the patients address on file.   Dr. Camnitz to read. 

## 2023-09-24 NOTE — Patient Instructions (Signed)
Medication Instructions:  Your physician recommends that you continue on your current medications as directed. Please refer to the Current Medication list given to you today.  *If you need a refill on your cardiac medications before your next appointment, please call your pharmacy*  Lab Work: None ordered today.  Testing/Procedures: Your physician has requested that you wear a Zio heart monitor for 3 days. This will be mailed to your home with instructions on how to apply the monitor and how to return it when finished. Please allow 2 weeks after returning the heart monitor before our office calls you with the results.   Follow-Up: At [facility], you and your health needs are our priority.  As part of our continuing mission to provide you with exceptional heart care, we have created designated Provider Care Teams.  These Care Teams include your primary Cardiologist (physician) and Advanced Practice Providers (APPs -  Physician Assistants and Nurse Practitioners) who all work together to provide you with the care you need, when you need it.  Your next appointment:   2-3 month(s)  The format for your next appointment:   In Person  Provider:   Loman Brooklyn, MD{  Other Instructions ZIO XT- Long Term Monitor Instructions     Your physician has requested you wear a ZIO patch monitor for 3 days.  This is a single patch monitor. [employer] supplies one patch monitor per enrollment. Additional  stickers are not available. Please do not apply patch if you will be having a Nuclear Stress Test,  Echocardiogram, Cardiac CT, MRI, or Chest Xray during the period you would be wearing the  monitor. The patch cannot be worn during these tests. You cannot remove and re-apply the  ZIO XT patch monitor.  Your ZIO patch monitor will be mailed 3 day USPS to your address on file. It may take 3-5 days  to receive your monitor after you have been enrolled.  Once you have received your monitor, please review  the enclosed instructions. Your monitor  has already been registered assigning a specific monitor serial # to you.     Billing and Patient Assistance Program Information     We have supplied [employer] with any of your insurance information on file for billing purposes.  [employer] offers a sliding scale Patient Assistance Program for patients that do not have  insurance, or whose insurance does not completely cover the cost of the ZIO monitor.  You must apply for the Patient Assistance Program to qualify for this discounted rate.  To apply, please call [employer] at [phone], select option 4, select option 2, ask to apply for  Patient Assistance Program. Meredeth Ide will ask your household income, and how many people  are in your household. They will quote your out-of-pocket cost based on that information.  [employer] will also be able to set up a 3-month, interest-free payment plan if needed.     Applying the monitor     Shave hair from upper left chest.  Hold abrader disc by orange tab. Rub abrader in 40 strokes over the upper left chest as  indicated in your monitor instructions.  Clean area with 4 enclosed alcohol pads. Let dry.  Apply patch as indicated in monitor instructions. Patch will be placed under collarbone on left  side of chest with arrow pointing upward.  Rub patch adhesive wings for 2 minutes. Remove white label marked "1". Remove the white  label marked "2". Rub patch adhesive wings for 2 additional minutes.  While looking in a mirror, press and release button in center of patch. A small green light will  flash 3-4 times. This will be your only indicator that the monitor has been turned on.  Do not shower for the first 24 hours. You may shower after the first 24 hours.  Press the button if you feel a symptom. You will hear a small click. Record Date, Time and  Symptom in the Patient Logbook.  When you are ready to remove the patch, follow instructions on the last 2 pages of  Patient  Logbook. Stick patch monitor onto the last page of Patient Logbook.  Place Patient Logbook in the blue and white box. Use locking tab on box and tape box closed  securely. The blue and white box has prepaid postage on it. Please place it in the mailbox as  soon as possible. Your physician should have your test results approximately 7 days after the  monitor has been mailed back to [facility].  Call [facility] Customer Care at [phone] if you have questions regarding  your ZIO XT patch monitor. Call them immediately if you see an orange light blinking on your  monitor.  If your monitor falls off in less than 4 days, contact our Monitor department at [phone].  If your monitor becomes loose or falls off after 4 days call [employer] at [phone] for  suggestions on securing your monitor.

## 2023-09-27 DIAGNOSIS — R002 Palpitations: Secondary | ICD-10-CM | POA: Diagnosis not present

## 2023-10-09 DIAGNOSIS — I471 Supraventricular tachycardia, unspecified: Secondary | ICD-10-CM

## 2023-10-09 HISTORY — DX: Supraventricular tachycardia, unspecified: I47.10

## 2023-10-17 ENCOUNTER — Other Ambulatory Visit: Payer: Self-pay | Admitting: Surgery

## 2023-10-18 ENCOUNTER — Encounter
Admission: RE | Admit: 2023-10-18 | Discharge: 2023-10-18 | Disposition: A | Discharge status: Home / Self Care | Payer: Medicare PPO | Source: Ambulatory Visit | Attending: Surgery | Admitting: Surgery

## 2023-10-18 ENCOUNTER — Telehealth: Payer: Self-pay | Admitting: *Deleted

## 2023-10-18 ENCOUNTER — Other Ambulatory Visit: Payer: Self-pay

## 2023-10-18 ENCOUNTER — Encounter: Payer: Self-pay | Admitting: Surgery

## 2023-10-18 VITALS — BP 150/71 | HR 63 | Temp 97.4°F | Resp 15 | Ht 62.0 in | Wt 179.0 lb

## 2023-10-18 DIAGNOSIS — Z01812 Encounter for preprocedural laboratory examination: Secondary | ICD-10-CM

## 2023-10-18 DIAGNOSIS — Z01818 Encounter for other preprocedural examination: Secondary | ICD-10-CM | POA: Insufficient documentation

## 2023-10-18 DIAGNOSIS — R8290 Unspecified abnormal findings in urine: Secondary | ICD-10-CM | POA: Insufficient documentation

## 2023-10-18 HISTORY — DX: Moderate persistent asthma, uncomplicated: J45.40

## 2023-10-18 HISTORY — DX: Diverticulosis of intestine, part unspecified, without perforation or abscess without bleeding: K57.90

## 2023-10-18 HISTORY — DX: Hyperlipidemia, unspecified: E78.5

## 2023-10-18 HISTORY — DX: Other intervertebral disc degeneration, lumbar region without mention of lumbar back pain or lower extremity pain: M51.369

## 2023-10-18 HISTORY — DX: Essential (primary) hypertension: I10

## 2023-10-18 HISTORY — DX: Personal history of urinary calculi: Z87.442

## 2023-10-18 LAB — CBC WITH DIFFERENTIAL/PLATELET
Abs Immature Granulocytes: 0.02 10*3/uL (ref 0.00–0.07)
Basophils Absolute: 0 10*3/uL (ref 0.0–0.1)
Basophils Relative: 0 %
Eosinophils Absolute: 0.1 10*3/uL (ref 0.0–0.5)
Eosinophils Relative: 1 %
HCT: 38 % (ref 36.0–46.0)
Hemoglobin: 12.6 g/dL (ref 12.0–15.0)
Immature Granulocytes: 0 %
Lymphocytes Relative: 17 %
Lymphs Abs: 1.2 10*3/uL (ref 0.7–4.0)
MCH: 32.3 pg (ref 26.0–34.0)
MCHC: 33.2 g/dL (ref 30.0–36.0)
MCV: 97.4 fL (ref 80.0–100.0)
Monocytes Absolute: 0.5 10*3/uL (ref 0.1–1.0)
Monocytes Relative: 7 %
Neutro Abs: 5.2 10*3/uL (ref 1.7–7.7)
Neutrophils Relative %: 75 %
Platelets: 244 10*3/uL (ref 150–400)
RBC: 3.9 MIL/uL (ref 3.87–5.11)
RDW: 13.5 % (ref 11.5–15.5)
WBC: 7 10*3/uL (ref 4.0–10.5)
nRBC: 0 % (ref 0.0–0.2)

## 2023-10-18 LAB — COMPREHENSIVE METABOLIC PANEL
ALT: 64 U/L — ABNORMAL HIGH (ref 0–44)
AST: 51 U/L — ABNORMAL HIGH (ref 15–41)
Albumin: 3.9 g/dL (ref 3.5–5.0)
Alkaline Phosphatase: 54 U/L (ref 38–126)
Anion gap: 9 (ref 5–15)
BUN: 23 mg/dL (ref 8–23)
CO2: 29 mmol/L (ref 22–32)
Calcium: 9.1 mg/dL (ref 8.9–10.3)
Chloride: 103 mmol/L (ref 98–111)
Creatinine, Ser: 1.06 mg/dL — ABNORMAL HIGH (ref 0.44–1.00)
GFR, Estimated: 53 mL/min — ABNORMAL LOW (ref >60)
Glucose, Bld: 85 mg/dL (ref 70–99)
Potassium: 3.7 mmol/L (ref 3.5–5.1)
Sodium: 141 mmol/L (ref 135–145)
Total Bilirubin: 0.5 mg/dL (ref <1.2)
Total Protein: 7 g/dL (ref 6.5–8.1)

## 2023-10-18 LAB — URINALYSIS, ROUTINE W REFLEX MICROSCOPIC
Bacteria, UA: NONE SEEN
Bilirubin Urine: NEGATIVE
Glucose, UA: NEGATIVE mg/dL
Hgb urine dipstick: NEGATIVE
Ketones, ur: NEGATIVE mg/dL
Nitrite: NEGATIVE
Protein, ur: NEGATIVE mg/dL
Specific Gravity, Urine: 1.02 (ref 1.005–1.030)
pH: 6 (ref 5.0–8.0)

## 2023-10-18 LAB — SURGICAL PCR SCREEN
MRSA, PCR: NEGATIVE
Staphylococcus aureus: NEGATIVE

## 2023-10-18 NOTE — Patient Instructions (Signed)
Your procedure is scheduled on: Tuesday, December 10 Report to the Registration Desk on the 1st floor of the [facility] To find out your arrival time, please call [phone] between 1PM - 3PM on: Monday, December 9 If your arrival time is 6:00 am, do not arrive before that time as the [facility] entrance doors do not open until 6:00 am.  REMEMBER: Instructions that are not followed completely may result in serious medical risk, up to and including death; or upon the discretion of your surgeon and anesthesiologist your surgery may need to be rescheduled.  Do not eat food after midnight the night before surgery.  No gum chewing or hard candies.  You may however, drink CLEAR liquids up to 2 hours before you are scheduled to arrive for your surgery. Do not drink anything within 2 hours of your scheduled arrival time.  Clear liquids include: - water  - apple juice without pulp - gatorade (not RED colors) - black coffee or tea (Do NOT add milk or creamers to the coffee or tea) Do NOT drink anything that is not on this list.  In addition, your doctor has ordered for you to drink the provided:  Ensure Pre-Surgery Clear Carbohydrate Drink  Drinking this carbohydrate drink up to two hours before surgery helps to reduce insulin resistance and improve patient outcomes. Please complete drinking 2 hours before scheduled arrival time.  One week prior to surgery: starting today, December 5 Stop Anti-inflammatories (NSAIDS) such as Advil, Aleve, Ibuprofen, Motrin, Naproxen, Naprosyn and Aspirin based products such as Excedrin, Goody's Powder, BC Powder. Stop ANY OVER THE COUNTER supplements until after surgery. Stop multiple vitamins.  You may however, continue to take Tylenol if needed for pain up until the day of surgery.  Eliquis - hold for 3 days before surgery. Last day to take Eliquis is Friday, December 6. Resume AFTER surgery per surgeon instruction.  Continue taking all of your other  prescription medications up until the day of surgery.  ON THE DAY OF SURGERY ONLY TAKE THESE MEDICATIONS WITH SIPS OF WATER:  Amlodipine Citalopram (Celexa) Fluticasone Arnuity inhaler Omeprazole (Prilosec) Tolterodine (Detrol)  Use inhalers on the day of surgery and bring your albuterol inhaler to the hospital.  No Alcohol for 24 hours before or after surgery.  No Smoking including e-cigarettes for 24 hours before surgery.  No chewable tobacco products for at least 6 hours before surgery.  No nicotine patches on the day of surgery.  Do not use any "recreational" drugs for at least a week (preferably 2 weeks) before your surgery.  Please be advised that the combination of cocaine and anesthesia may have negative outcomes, up to and including death. If you test positive for cocaine, your surgery will be cancelled.  On the morning of surgery brush your teeth with toothpaste and water, you may rinse your mouth with mouthwash if you wish. Do not swallow any toothpaste or mouthwash.  Use CHG Soap as directed on instruction sheet.  Do not wear jewelry, make-up, hairpins, clips or nail polish.  For welded (permanent) jewelry: bracelets, anklets, waist bands, etc.  Please have this removed prior to surgery.  If it is not removed, there is a chance that hospital personnel will need to cut it off on the day of surgery.  Do not wear lotions, powders, or perfumes.   Do not shave body hair from the neck down 48 hours before surgery.  Contact lenses, hearing aids and dentures may not be worn into  surgery.  Do not bring valuables to the hospital. [facility] is not responsible for any missing/lost belongings or valuables.   Notify your doctor if there is any change in your medical condition (cold, fever, infection).  Wear comfortable clothing (specific to your surgery type) to the hospital.  After surgery, you can help prevent lung complications by doing breathing exercises.  Take deep  breaths and cough every 1-2 hours. Your doctor may order a device called an Incentive Spirometer to help you take deep breaths.  If you are being admitted to the hospital overnight, leave your suitcase in the car. After surgery it may be brought to your room.  In case of increased patient census, it may be necessary for you, the patient, to continue your postoperative care in the Same Day Surgery department.  If you are being discharged the day of surgery, you will not be allowed to drive home. You will need a responsible individual to drive you home and stay with you for 24 hours after surgery.   If you are taking public transportation, you will need to have a responsible individual with you.  Please call the Pre-admissions Testing Dept. at [phone] if you have any questions about these instructions.  Surgery Visitation Policy:  Patients having surgery or a procedure may have two visitors.  Children under the age of 49 must have an adult with them who is not the patient.  Inpatient Visitation:    Visiting hours are 7 a.m. to 8 p.m. Up to four visitors are allowed at one time in a patient room. The visitors may rotate out with other people during the day.  One visitor age 95 or older may stay with the patient overnight and must be in the room by 8 p.m.    Pre-operative 5 CHG Bath Instructions   You can play a key role in reducing the risk of infection after surgery. Your skin needs to be as free of germs as possible. You can reduce the number of germs on your skin by washing with CHG (chlorhexidine gluconate) soap before surgery. CHG is an antiseptic soap that kills germs and continues to kill germs even after washing.   DO NOT use if you have an allergy to chlorhexidine/CHG or antibacterial soaps. If your skin becomes reddened or irritated, stop using the CHG and notify one of our RNs at [phone].   Please shower with the CHG soap starting 4 days before surgery using the  following schedule:     Please keep in mind the following:  DO NOT shave, including legs and underarms, starting the day of your first shower.   You may shave your face at any point before/day of surgery.  Place clean sheets on your bed the day you start using CHG soap. Use a clean washcloth (not used since being washed) for each shower. DO NOT sleep with pets once you start using the CHG.   CHG Shower Instructions:  If you choose to wash your hair and private area, wash first with your normal shampoo/soap.  After you use shampoo/soap, rinse your hair and body thoroughly to remove shampoo/soap residue.  Turn the water OFF and apply about 3 tablespoons (45 ml) of CHG soap to a CLEAN washcloth.  Apply CHG soap ONLY FROM YOUR NECK DOWN TO YOUR TOES (washing for 3-5 minutes)  DO NOT use CHG soap on face, private areas, open wounds, or sores.  Pay special attention to the area where your surgery is being  performed.  If you are having back surgery, having someone wash your back for you may be helpful. Wait 2 minutes after CHG soap is applied, then you may rinse off the CHG soap.  Pat dry with a clean towel  Put on clean clothes/pajamas   If you choose to wear lotion, please use ONLY the CHG-compatible lotions on the back of this paper.     Additional instructions for the day of surgery: DO NOT APPLY any lotions, deodorants, cologne, or perfumes.   Put on clean/comfortable clothes.  Brush your teeth.  Ask your nurse before applying any prescription medications to the skin.      CHG Compatible Lotions   Aveeno Moisturizing lotion  Cetaphil Moisturizing Cream  Cetaphil Moisturizing Lotion  Clairol Herbal Essence Moisturizing Lotion, Dry Skin  Clairol Herbal Essence Moisturizing Lotion, Extra Dry Skin  Clairol Herbal Essence Moisturizing Lotion, Normal Skin  Curel Age Defying Therapeutic Moisturizing Lotion with Alpha Hydroxy  Curel Extreme Care Body Lotion  Curel Soothing Hands  Moisturizing Hand Lotion  Curel Therapeutic Moisturizing Cream, Fragrance-Free  Curel Therapeutic Moisturizing Lotion, Fragrance-Free  Curel Therapeutic Moisturizing Lotion, Original Formula  Eucerin Daily Replenishing Lotion  Eucerin Dry Skin Therapy Plus Alpha Hydroxy Crme  Eucerin Dry Skin Therapy Plus Alpha Hydroxy Lotion  Eucerin Original Crme  Eucerin Original Lotion  Eucerin Plus Crme Eucerin Plus Lotion  Eucerin TriLipid Replenishing Lotion  Keri Anti-Bacterial Hand Lotion  Keri Deep Conditioning Original Lotion Dry Skin Formula Softly Scented  Keri Deep Conditioning Original Lotion, Fragrance Free Sensitive Skin Formula  Keri Lotion Fast Absorbing Fragrance Free Sensitive Skin Formula  Keri Lotion Fast Absorbing Softly Scented Dry Skin Formula  Keri Original Lotion  Keri Skin Renewal Lotion Keri Silky Smooth Lotion  Keri Silky Smooth Sensitive Skin Lotion  Nivea Body Creamy Conditioning Oil  Nivea Body Extra Enriched Lotion  Nivea Body Original Lotion  Nivea Body Sheer Moisturizing Lotion Nivea Crme  Nivea Skin Firming Lotion  NutraDerm 30 Skin Lotion  NutraDerm Skin Lotion  NutraDerm Therapeutic Skin Cream  NutraDerm Therapeutic Skin Lotion  ProShield Protective Hand Cream  Provon moisturizing lotion  Preoperative Educational Videos for Total Hip, Knee and Shoulder Replacements  To better prepare for surgery, please view our videos that explain the physical activity and discharge planning required to have the best surgical recovery at [facility].  [email or web address]  Questions? Call [phone] or email [email or web address]

## 2023-10-18 NOTE — Telephone Encounter (Signed)
-----   Message from Verlee Monte sent at 10/18/2023  3:21 PM EST ----- Regarding: Request for pre-operative cardiac clearance Request for pre-operative cardiac clearance:  1. What type of surgery is being performed?  TOTAL KNEE ARTHROPLASTY  2. When is this surgery scheduled?  10/23/2023  3. Type of clearance being requested (medical, pharmacy, both)? BOTH   4. Are there any medications that need to be held prior to surgery? APIXABAN  5. Practice name and name of physician performing surgery?  Performing surgeon: Dr. Leron Croak, MD Requesting clearance: Quentin Mulling, FNP-C    6. Anesthesia type (none, local, MAC, general)? GENERAL  7. What is the office phone and fax number?   Phone: [phone] Fax: [phone]  ATTENTION: Unable to create telephone message as per your standard workflow. Directed by [facility] providers to send requests for cardiac clearance to this pool for appropriate distribution to provider covering pre-operative clearances.   Quentin Mulling, MSN, APRN, FNP-C, CEN [facility]  Peri-operative Services Nurse Practitioner Phone: [phone] 10/18/23 3:21 PM

## 2023-10-19 ENCOUNTER — Encounter: Payer: Self-pay | Admitting: Surgery

## 2023-10-19 NOTE — Progress Notes (Signed)
Perioperative / Anesthesia Services  Pre-Admission Testing Clinical Review / Pre-Operative Anesthesia Consult  Date: 10/19/23  Patient Demographics:  Name: MAXIMA SCHMELTZ DOB:   [birth year] MRN:   [id number]  Planned Surgical Procedure(s):    Case: [id number] Date/Time: 10/23/23 1024   Procedure: TOTAL KNEE ARTHROPLASTY (Left: Knee)   Anesthesia type: Choice   Pre-op diagnosis: PRIMARY OSTEOARTHRITIS OF LEFT KNEE.   Location: [facility] OR ROOM 02 / [facility] ORS FOR ANESTHESIA GROUP   Surgeons: Christena Flake, MD     NOTE: Available PAT nursing documentation and vital signs have been reviewed. Clinical nursing staff has updated patient's PMH/PSHx, current medication list, and drug allergies/intolerances to ensure comprehensive history available to assist in medical decision making as it pertains to the aforementioned surgical procedure and anticipated anesthetic course. Extensive review of available clinical information personally performed. [place] [facility] and PSHx updated with any diagnoses/procedures that  may have been inadvertently omitted during her intake with the pre-admission testing department's nursing staff.  Clinical Discussion:  Emma Rivera is a 82 y.o. female who is submitted for pre-surgical anesthesia review and clearance prior to her undergoing the above procedure. Patient has never been a smoker. Pertinent PMH includes: atrial fibrillation, diastolic dysfunction, NSVT, PSVT, remote infarct of LEFT posterior inferior cerebellum, acute ischemic LEFT MCA infarct, cerebral microvascular disease, cardiac murmur, HTN, HLD, prediabetes, asthma, GERD (on daily PPI), esophageal spasm, OA, cervical and lumbar DDD, nephrolithiasis, depression.  Patient is followed by cardiology (End, MD and Elberta Fortis, MD). She was last seen in the cardiology clinic on 09/24/2023; notes reviewed. At the time of her clinic visit, patient doing well overall from a cardiovascular perspective. Patient denied any chest pain,  shortness of breath, PND, orthopnea, palpitations, significant peripheral edema, weakness, fatigue, vertiginous symptoms, or presyncope/syncope. Patient with a past medical history significant for cardiovascular diagnoses. Documented physical exam was grossly benign, providing no evidence of acute exacerbation and/or decompensation of the patient's known cardiovascular conditions.  Patient diagnosed with new onset atrial fibrillation on 12/16/2018.    Atrial arrhythmia felt to have caused embolic CVA.  CT imaging of the head initially was negative for acute CVA.  CTA of the neck revealed an emergent M2 occlusion.  Neurology was consulted and patient was ultimately treated with TPA and transferred to [facility] for possible thrombectomy.  Repeat CT imaging of the head done at [facility] revealed interval resolution of the previously noted M2 occlusion. No thrombectomy was indicated. Patient was admitted for ongoing care and management. MRI imaging of the brain was performed on 12/17/2018 revealing an acute LEFT MCA infarct with 3 punctate areas of restricted diffusion.  The largest area involve the left insular cortex. Additionally MRI imaging revealed a remote infarct of the posterior inferior LEFT cerebellum.  Patient underwent DCCV procedure 04/01/2019, at which time she received a single 120 J synchronized cardioversion briefly converting her to NSR, however ERAF occurred.   Patient underwent A.fib ablation procedure on 06/13/2019.   Due to refractory arrhythmia, patient went underwent second DCCV procedure on 03/12/2023.  Patient received a single 150 J synchronized cardioversion, which restored NSR.  Patient with refractory atrial fibrillation.  She underwent repeat cardiac A.fib ablation on 06/21/2023.  Since that time, patient has maintained sinus rhythm with brief/occasional episodes of irregularity.  Myocardial perfusion imaging study was performed on 07/18/2019 revealing a normal left  ventricular systolic function with a hyperdynamic LVEF of 74%.  There was no evidence of stress-induced myocardial ischemia or arrhythmia; no scintigraphic  evidence of scar.  Study was determined to be normal and low risk.  Most recent TTE was performed on 03/23/2023 revealing a normal left ventricular systolic function with an EF of 60 to 65%.  There were no regional wall motion abnormalities. Left ventricular diastolic Doppler parameters consistent with pseudonormalization (G2DD).  Right ventricular size and function normal.  Left atrium moderately and right atrium mildly dilated.  There was aortic valve sclerosis/calcification. All transvalvular gradients were noted to be normal providing no evidence suggestive of valvular stenosis. Aorta normal in size with no evidence of aneurysmal dilatation.  Coronary CTA was performed on 06/13/2023 that demonstrated an Agatston coronary artery calcium score of 0. Study demonstrates normal coronary origin with RIGHT dominance.  Most recent long-term cardiac event monitor study was performed on 10/09/2023 revealing a predominant underlying sinus rhythm at an average rate of 64 bpm; range 51-210 bpm.  There were 37 runs of SVT, all of which lasted less than 17 beats.  Supraventricular ectopy burden 3.3%.  There was a less than 1.0% ventricular ectopy burden.  There were no patient triggered events.  Of note, previous ZIO study performed back in 01/2019 revealed 2 runs of NSVT lasting up to 18 beats at a maximum rate of 174 bpm.  Following termination of NSVT runs, patient experienced post termination pauses lasting up to 4.6 seconds.  Again, patient with an atrial fibrillation diagnosis; CHA2DS2-VASc Score = 6 (age x 2, sex, HTN, CVA x 2). Currently, patient's cardiac rate and rhythm are being maintained intrinsically without the need for pharmacological intervention. Patient remains on daily oral anticoagulation therapy using standard dose apixaban. Blood pressure well  controlled at 120/60 mmHg on currently prescribed CCB (amlodipine), diuretic (HCTZ), and ARB (losartan) therapies.  Patient is on atorvastatin for her HLD diagnosis and ASCVD prevention.  Patient has a prediabetes diagnosis.  Her last hemoglobin A1c was 5.9% when checked on 10/01/2023. Patient does not have an OSAH diagnosis.  Functional capacity is limited by patient's age, arthritides, and multiple medical comorbidities.  With that said, patient is able to complete all of her ADLs/IADLs without cardiovascular limitation.  Per the DASI, patient is able to achieve at least 4 METS of physical activity without experiencing any significant degree of angina/anginal equivalent symptoms. No changes were made to her medication regimen.  Patient to follow-up with outpatient cardiology in 2 to 3 months or sooner if needed.  [name] is scheduled for an elective TOTAL KNEE ARTHROPLASTY (Left: Knee) on 10/23/2023 with Dr. Leron Croak, MD.  Given patient's past medical history significant for cardiovascular diagnoses, presurgical cardiac clearance was sought by the PAT team.  Per cardiology, "RCRI score is 1, which is associated with a 0.9% perioperative cardiac risk. Based ACC/AHA guidelines, the patient's past medical history, and the amount of time since her last clinic visit, this patient would be at an overall ACCEPTABLE risk for the planned procedure without further cardiovascular testing or intervention at this time".   Again, this patient is on daily oral anticoagulation therapy using a DOAC medication.  She has been instructed on recommendations for holding her apixaban for 3 days prior to her procedure with plans to restart as soon as postoperative bleeding risk felt to be minimized by her attending surgeon. The patient has been instructed that her last dose of her apixaban should be on 10/19/2023.  Patient denies previous perioperative complications with anesthesia in the past. In review of the available  records, it is noted that patient underwent a  general anesthetic course at [facility] ([facility]) in 06/2023 without documented complications.      10/18/2023    3:05 PM 09/24/2023   10:31 AM 07/19/2023    1:18 PM  Vitals with BMI  Height 5\' 2"  5\' 2"  5\' 2"   Weight 179 lbs 181 lbs 180 lbs 3 oz  BMI 32.73 33.1 32.95  Systolic 150 120 130  Diastolic 71 60 74  Pulse 63 70 58    Providers/Specialists:   NOTE: Primary physician provider listed below. Patient may have been seen by APP or partner within same practice.   PROVIDER ROLE / SPECIALTY LAST OV  Poggi, Excell Seltzer, MD Orthopedics (Surgeon) 10/08/2023  [name], MD Primary Care Provider 10/08/2019:  Yvonne Kendall, MD Cardiology 03/23/2023; update call with preop APP on 10/19/2023  Loman Brooklyn, MD Electrophysiology 09/24/2023  Vida Rigger, MD Pulmonary Medicine 05/02/2023  Delia Heady, MD Neurology 02/12/2020   Allergies:  Imdur [isosorbide nitrate] and Sulfa antibiotics  Current Home Medications:   No current facility-administered medications for this encounter.    acetaminophen (TYLENOL) 500 MG tablet   albuterol (VENTOLIN HFA) 108 (90 Base) MCG/ACT inhaler   amLODipine (NORVASC) 10 MG tablet   apixaban (ELIQUIS) 5 MG TABS tablet   atorvastatin (LIPITOR) 20 MG tablet   Carboxymethylcellul-Glycerin (LUBRICATING EYE DROPS OP)   citalopram (CELEXA) 20 MG tablet   Fluticasone Furoate 50 MCG/ACT AEPB   hydrochlorothiazide (HYDRODIURIL) 25 MG tablet   loratadine (CLARITIN) 10 MG tablet   losartan (COZAAR) 50 MG tablet   Multiple Vitamin (MULTIVITAMIN) tablet   omeprazole (PRILOSEC) 20 MG capsule   tolterodine (DETROL) 2 MG tablet   History:   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   a.) CTA head/neck showed emergent M2 occlusion; CT head negative --> neurology consulted and recommended TPA and transfer to [facility] for possible thrombectomy; b.) repeat  CT at [facility] revealed interval resolution of M2 occlusion --> admitted; c.) MRI brain 12/17/2018: acute LEFT MCA infarct with 3 punctate areas of restricted diffusion (largest area involved the LEFT insular cortex)   Atrial fibrillation (HCC) 12/16/2018   a.) new onset 12/16/2018; b.) CHA2DS2-VASc = 6 (age x2, sex, HTN, CVA x2) as of 10/19/2023; c.) s/p DCCV (120 J x 1) with ERAF 04/01/2019; d.) s/p ablation 06/13/2019; e.) s/p DCCV (150 J x1) 03/12/2023; f.) s/p repeat ablation 06/21/2023; g.) cardiac rate/rhythm currently maintained instrinsically without pharmacological intervention; chronically anticoagulated using standard dose apixaban   Baker's cyst of knee, left    Cerebral microvascular disease    DDD (degenerative disc disease), cervical    DDD (degenerative disc disease), lumbar    Depression    Diastolic dysfunction 03/23/2023   a.) TTE 03/23/2023: EF 60-65%, no RWMAs, G2DD, RVSF/RVSP normal, mod LAE, mild RAE, AoV sclerosis with no stenosis   Diverticulosis    Esophageal spasm    Essential hypertension    GERD (gastroesophageal reflux disease)    Heart murmur    History of bilateral cataract extraction 2016   History of kidney stones    History of stroke involving cerebellum    a.) date of neurovascular event unknown; infarct of the posterior inferior LEFT cerebellum noted on MRI brain done on 12/17/2018 performed in setting of acute LEFT MCA infarct   HOH (hard of hearing)    Hyperlipidemia    Moderate persistent asthma    NSVT (nonsustained ventricular tachycardia) (HCC) 01/21/2019   a.) Zio 01/21/2019: 2  runs NSVT lasting up to 18 beats at max rate of 174 bpm;  up to 4.6 second post-termination pauses   On apixaban therapy    Osteoarthritis    Prediabetes    PSVT (paroxysmal supraventricular tachycardia) (HCC) 10/09/2023   a.) Zio 10/09/2023: 37 runs each lasting < 17 seconds (max rate 210 bpm)   Past Surgical History:  Procedure Laterality Date   ATRIAL FIBRILLATION  ABLATION N/A 06/13/2019   Procedure: ATRIAL FIBRILLATION ABLATION;  Surgeon: Regan Lemming, MD;  [facility]: [facility] INVASIVE CV LAB;  Service: Cardiovascular;  Laterality: N/A;   ATRIAL FIBRILLATION ABLATION N/A 06/21/2023   Procedure: ATRIAL FIBRILLATION ABLATION;  Surgeon: Regan Lemming, MD;  Location: [facility] INVASIVE CV LAB;  Service: Cardiovascular;  Laterality: N/A;   CARDIAC CATHETERIZATION     CARDIOVERSION N/A 04/01/2019   Procedure: CARDIOVERSION;  Surgeon: Lars Masson, MD;  Location: [facility] ENDOSCOPY;  Service: Cardiovascular;  Laterality: N/A;   CARDIOVERSION N/A 03/12/2023   Procedure: CARDIOVERSION;  Surgeon: Meriam Sprague, MD;  Location: [facility] INVASIVE CV LAB;  Service: Cardiovascular;  Laterality: N/A;   CATARACT EXTRACTION W/PHACO Left 09/09/2015   Procedure: CATARACT EXTRACTION PHACO AND INTRAOCULAR LENS PLACEMENT (IOC);  Surgeon: Lia Hopping, MD;  Location: [facility] ORS;  Service: Ophthalmology;  Laterality: Left;  [place]   1:10.5 AP     8.6 CDE  6.05 casette lot #  [id number]   CATARACT EXTRACTION W/PHACO Right 09/30/2015   Procedure: CATARACT EXTRACTION PHACO AND INTRAOCULAR LENS PLACEMENT (IOC);  Surgeon: Lia Hopping, MD;  Location: [facility] ORS;  Service: Ophthalmology;  Laterality: Right;  [place] 01:00.3 AP%: 11.6 CDE: 7.00 FLUID LOT# [id number]   COLONOSCOPY  01/02/2007   COLONOSCOPY WITH PROPOFOL N/A 04/04/2017   Procedure: COLONOSCOPY WITH PROPOFOL;  Surgeon: Scot Jun, MD;  Location: [facility] ENDOSCOPY;  Service: Endoscopy;  Laterality: N/A;   ESOPHAGOGASTRODUODENOSCOPY  10/09/2007   TONSILLECTOMY     Family History  Problem Relation Age of Onset   Breast cancer Maternal Aunt    Hypertension Mother    Heart disease Mother    Heart disease Father    Hypertension Father    Social History   Tobacco Use   Smoking status: Never   Smokeless tobacco: Never   Tobacco comments:    Never smoked 07/19/23  Vaping Use   Vaping status: Never Used  Substance  Use Topics   Alcohol use: Yes    Alcohol/week: 1.0 standard drink of alcohol    Types: 1 Glasses of wine per week    Comment: occasional   Drug use: No    Pertinent Clinical Results:  LABS:   Hospital Outpatient Visit on 10/18/2023  Component Date Value Ref Range Status   MRSA, PCR 10/18/2023 NEGATIVE  NEGATIVE Final   Staphylococcus aureus 10/18/2023 NEGATIVE  NEGATIVE Final   Comment: (NOTE) The Xpert SA Assay (FDA approved for NASAL specimens in patients 62 years of age and older), is one component of a comprehensive surveillance program. It is not intended to diagnose infection nor to guide or monitor treatment. Performed at [facility], [address] [place], [place] [address]    WBC 10/18/2023 7.0  4.0 - 10.5 K/uL Final   RBC 10/18/2023 3.90  3.87 - 5.11 MIL/uL Final   Hemoglobin 10/18/2023 12.6  12.0 - 15.0 g/dL Final   HCT 62/13/0865 38.0  36.0 - 46.0 % Final   MCV 10/18/2023 97.4  80.0 - 100.0 fL Final   MCH 10/18/2023 32.3  26.0 -  34.0 pg Final   MCHC 10/18/2023 33.2  30.0 - 36.0 g/dL Final   RDW 21/30/8657 13.5  11.5 - 15.5 % Final   Platelets 10/18/2023 244  150 - 400 K/uL Final   nRBC 10/18/2023 0.0  0.0 - 0.2 % Final   Neutrophils Relative % 10/18/2023 75  % Final   Neutro Abs 10/18/2023 5.2  1.7 - 7.7 K/uL Final   Lymphocytes Relative 10/18/2023 17  % Final   Lymphs Abs 10/18/2023 1.2  0.7 - 4.0 K/uL Final   Monocytes Relative 10/18/2023 7  % Final   Monocytes Absolute 10/18/2023 0.5  0.1 - 1.0 K/uL Final   Eosinophils Relative 10/18/2023 1  % Final   Eosinophils Absolute 10/18/2023 0.1  0.0 - 0.5 K/uL Final   Basophils Relative 10/18/2023 0  % Final   Basophils Absolute 10/18/2023 0.0  0.0 - 0.1 K/uL Final   Immature Granulocytes 10/18/2023 0  % Final   Abs Immature Granulocytes 10/18/2023 0.02  0.00 - 0.07 K/uL Final   Performed at [facility], [address] [place], [place] [address]   Sodium 10/18/2023 141  135 - 145 mmol/L  Final   Potassium 10/18/2023 3.7  3.5 - 5.1 mmol/L Final   Chloride 10/18/2023 103  98 - 111 mmol/L Final   CO2 10/18/2023 29  22 - 32 mmol/L Final   Glucose, Bld 10/18/2023 85  70 - 99 mg/dL Final   Glucose reference range applies only to samples taken after fasting for at least 8 hours.   BUN 10/18/2023 23  8 - 23 mg/dL Final   Creatinine, Ser 10/18/2023 1.06 (H)  0.44 - 1.00 mg/dL Final   Calcium 29/52/8413 9.1  8.9 - 10.3 mg/dL Final   Total Protein 24/40/1027 7.0  6.5 - 8.1 g/dL Final   Albumin 25/36/6440 3.9  3.5 - 5.0 g/dL Final   AST 34/74/2595 51 (H)  15 - 41 U/L Final   ALT 10/18/2023 64 (H)  0 - 44 U/L Final   Alkaline Phosphatase 10/18/2023 54  38 - 126 U/L Final   Total Bilirubin 10/18/2023 0.5  <1.2 mg/dL Final   GFR, Estimated 10/18/2023 53 (L)  >60 mL/min Final   Comment: (NOTE) Calculated using the CKD-EPI Creatinine Equation (2021)    Anion gap 10/18/2023 9  5 - 15 Final   Performed at [facility], [address] [place], [place] [address]   Color, Urine 10/18/2023 YELLOW (A)  YELLOW Final   APPearance 10/18/2023 HAZY (A)  CLEAR Final   Specific Gravity, Urine 10/18/2023 1.020  1.005 - 1.030 Final   pH 10/18/2023 6.0  5.0 - 8.0 Final   Glucose, UA 10/18/2023 NEGATIVE  NEGATIVE mg/dL Final   Hgb urine dipstick 10/18/2023 NEGATIVE  NEGATIVE Final   Bilirubin Urine 10/18/2023 NEGATIVE  NEGATIVE Final   Ketones, ur 10/18/2023 NEGATIVE  NEGATIVE mg/dL Final   Protein, ur 64/33/2951 NEGATIVE  NEGATIVE mg/dL Final   Nitrite 88/41/6606 NEGATIVE  NEGATIVE Final   Leukocytes,Ua 10/18/2023 MODERATE (A)  NEGATIVE Final   RBC / HPF 10/18/2023 0-5  0 - 5 RBC/hpf Final   WBC, UA 10/18/2023 11-20  0 - 5 WBC/hpf Final   Bacteria, UA 10/18/2023 NONE SEEN  NONE SEEN Final   Squamous Epithelial / HPF 10/18/2023 0-5  0 - 5 /HPF Final   Mucus 10/18/2023 PRESENT   Final   Hyaline Casts, UA 10/18/2023 PRESENT   Final   Performed at [facility], [address] [place], [place] [address]    ECG: Date: 09/24/2023 Time ECG obtained: 1051 AM Rate: 59 bpm Rhythm:  Sinus rhythm with short PR Axis (leads I and aVF): Normal Intervals: PR 72 ms. QRS 76 ms. QTc 409 ms. ST segment and T wave changes: No evidence of acute ST segment elevation or depression. Comparison: Similar to previous tracing obtained on 07/19/2023   IMAGING / PROCEDURES: LONG TERM CARDIAC EVENT MONITOR STUDY performed on 10/09/2023 Patch Wear Time:  2 days and 23 hours Patient had a min HR of 51 bpm, max HR of 210 bpm, and avg HR of 64 bpm.  Predominant underlying rhythm was Sinus Rhythm.  37 Supraventricular Tachycardia runs occurred, all less than 17 beats  3.3% supraventricular ectopy Less than 1% ventricular ectopy No patient triggered episodes recorded  MR KNEE LEFT WO CONTRAST performed on 08/10/2023 Small focal grade 3 oblique tear of the posterior horn medial meniscus extending to the inferior surface. Meniscal extrusion with free edge fraying in the midbody and posterior horn lateral meniscus. Potential small free edge tear in the posterior horn along the inferior margin of the free edge. Subcortical stress fracture versus non-fragmented osteochondral lesion of the lateral femoral condyle. Large knee effusion with synovitis. Moderate-sized Baker's cyst. Mild proximal popliteus tendinopathy. Edema signal adjacent to the fibular collateral ligament without a distinct tear. Subcutaneous edema anterior to the patellar tendon and lateral retinaculum.  CT CARDIAC MORPH/PULM VEIN W/CM&W/O CA SCORE performed on 06/13/2023 There is normal pulmonary vein drainage into the left atrium with ostial measurements above. There is no thrombus in the left atrial appendage. The esophagus runs in the left atrial midline and is not in proximity to any of the pulmonary vein ostia. No PFO/ASD. Normal coronary origin. Right dominance. CAC score of 0 which is 0 percentile for age-, race-,  and sex-matched controls.  TRANSTHORACIC ECHOCARDIOGRAM performed on 03/23/2023 Left ventricular ejection fraction, by estimation, is 60 to 65%. The left ventricle has normal function. The left ventricle has no regional wall motion abnormalities. Left ventricular diastolic parameters are consistent with Grade II diastolic dysfunction (pseudonormalization).  Right ventricular systolic function is normal. The right ventricular size is normal. There is normal pulmonary artery systolic pressure.  Left atrial size was moderately dilated.  Right atrial size was mildly dilated.  The mitral valve is normal in structure. No evidence of mitral valve regurgitation. No evidence of mitral stenosis.  The aortic valve is normal in structure. Aortic valve regurgitation is not visualized. Aortic valve sclerosis/calcification is present, without any evidence of aortic stenosis.  The inferior vena cava is normal in size with greater than 50% respiratory variability, suggesting right atrial pressure of 3 mmHg.   NM MYOCAR MULTI W/SPECT W/WALL MOTION / EF performed on 07/18/2019 Pharmacological myocardial perfusion imaging study with no significant  ischemia Normal wall motion EF estimated at 74% No EKG changes concerning for ischemia at peak stress or in recovery. Low risk scan  MR BRAIN WO CONTRAST performed on 12/17/2018 Three punctate focal areas of restricted diffusion indicating acute nonhemorrhagic infarction in the left MCA territory without other larger confluent infarct. The largest area involves the left insular cortex. Atrophy and white matter disease is moderately advanced for age. This is consistent with chronic microvascular ischemic changes. White matter disease extends into the brainstem. 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  Impression and Plan:  Emma Rivera has been referred for pre-anesthesia review and clearance prior to her undergoing the planned anesthetic and  procedural courses. Available labs, pertinent testing, and imaging results were personally reviewed by me in preparation for upcoming operative/procedural course. [facility] medical record has been updated following extensive record review and patient interview with PAT staff.   This patient has been appropriately cleared by cardiology with an overall ACCEPTABLE  risk of experiencing significant perioperative cardiovascular complications. Based on clinical review performed today (10/19/23), barring any significant acute changes in the patient's overall condition, it is anticipated that she will be able to proceed with the planned surgical intervention. Any acute changes in clinical condition may necessitate her procedure being postponed and/or cancelled. Patient will meet with anesthesia team (MD and/or CRNA) on the day of her procedure for preoperative evaluation/assessment. Questions regarding anesthetic course will be fielded at that time.   Pre-surgical instructions were reviewed with the patient during her PAT appointment, and questions were fielded to satisfaction by PAT clinical staff. She has been instructed on which medications that she will need to hold prior to surgery, as well as the ones that have been deemed safe/appropriate to take on the day of her procedure. As part of the general education provided by PAT, patient made aware both verbally and in writing, that she would need to abstain from the use of any illegal substances during her perioperative course.  She was advised that failure to follow the provided instructions could necessitate case cancellation or result in serious perioperative complications up to and including death. Patient encouraged to contact PAT and/or her surgeon's office to discuss any questions or concerns that may arise prior to surgery; verbalized understanding.   Quentin Mulling, MSN, APRN, FNP-C, CEN [facility]  Perioperative Services Nurse  Practitioner Phone: [phone] Fax: [phone] 10/19/23 2:55 PM  NOTE: This note has been prepared using Dragon dictation software. Despite my best ability to proofread, there is always the potential that unintentional transcriptional errors may still occur from this process.

## 2023-10-19 NOTE — Telephone Encounter (Signed)
Patient with diagnosis of PAF on Eliquis for anticoagulation.    Procedure: TKA Date of procedure: 10/23/2023    CHA2DS2-VASc Score = 6   This indicates a 9.7% annual risk of stroke. The patient's score is based upon: CHF History: 0 HTN History: 1 Diabetes History: 0 Stroke History: 2 Vascular Disease History: 0 Age Score: 2 Gender Score: 1     CrCl 41 mL/min (based in adj BW) Platelet count 244 K  Patient had ablation on 06/21/2023.Typically hold Xarelto for 3 days prior to TKA, however patient is at elevated risk off of anticoagulation based on history of afib with stroke. Will forward to MD to confirm 3 day hold is acceptable   **This guidance is not considered finalized until pre-operative APP has relayed final recommendations.**

## 2023-10-19 NOTE — Telephone Encounter (Signed)
   Name: Emma Rivera  DOB: 1941/01/31  MRN: [id number]   Primary Cardiologist: Yvonne Kendall, MD  Chart reviewed as part of pre-operative protocol coverage.   [name] was last seen on 09/24/23 by Francis Dowse PAC.  Per Renee: She reported no c/o CP, SOB, some palpitations post ablation,  Was in an ectopic atrial rhythm 59bpm with f/u monitor noting SR  No CAD by CT in 2022  Preserved LVEF by echo 2024  In review of my note, nothing reported to suggest any cardiac clinical changes that would require any additional testing or cardiac contraindication to knee surgery  RCRI score is one (0.9%)   Recommend resuming her OAC as soon as safe as per her surgeon    Per Dr. Elberta Fortis: OK to hold xarelto for three days.   Therefore, based on ACC/AHA guidelines, the patient would be at acceptable risk for the planned procedure without further cardiovascular testing.   I will route this recommendation to the requesting party via Epic fax function and remove from pre-op pool. Please call with questions.  Roe Rutherford Cariah Salatino, PA 10/19/2023, 1:32 PM

## 2023-10-19 NOTE — Telephone Encounter (Signed)
Renee You saw this patient 09/24/23. Can you please give recommendations for preop risk stratification for TKA?\  Thanks Angie

## 2023-10-20 LAB — URINE CULTURE: Culture: NO GROWTH

## 2023-10-23 ENCOUNTER — Ambulatory Visit: Payer: Medicare PPO | Admitting: Urgent Care

## 2023-10-23 ENCOUNTER — Other Ambulatory Visit: Payer: Self-pay

## 2023-10-23 ENCOUNTER — Encounter
Admission: RE | Disposition: A | Discharge status: Home / Self Care | Payer: Self-pay | Source: Home / Self Care | Attending: Surgery

## 2023-10-23 ENCOUNTER — Ambulatory Visit
Admission: RE | Admit: 2023-10-23 | Discharge: 2023-10-24 | Disposition: A | Discharge status: Home / Self Care | Payer: Medicare PPO | Attending: Surgery | Admitting: Surgery

## 2023-10-23 ENCOUNTER — Encounter: Payer: Self-pay | Admitting: Surgery

## 2023-10-23 ENCOUNTER — Ambulatory Visit: Payer: Medicare PPO

## 2023-10-23 DIAGNOSIS — Z7901 Long term (current) use of anticoagulants: Secondary | ICD-10-CM | POA: Insufficient documentation

## 2023-10-23 DIAGNOSIS — M1712 Unilateral primary osteoarthritis, left knee: Secondary | ICD-10-CM | POA: Diagnosis present

## 2023-10-23 DIAGNOSIS — R7303 Prediabetes: Secondary | ICD-10-CM | POA: Insufficient documentation

## 2023-10-23 DIAGNOSIS — M23304 Other meniscus derangements, unspecified medial meniscus, left knee: Secondary | ICD-10-CM | POA: Insufficient documentation

## 2023-10-23 DIAGNOSIS — R8290 Unspecified abnormal findings in urine: Secondary | ICD-10-CM

## 2023-10-23 DIAGNOSIS — E669 Obesity, unspecified: Secondary | ICD-10-CM | POA: Insufficient documentation

## 2023-10-23 DIAGNOSIS — Z6832 Body mass index (BMI) 32.0-32.9, adult: Secondary | ICD-10-CM | POA: Insufficient documentation

## 2023-10-23 DIAGNOSIS — Z8673 Personal history of transient ischemic attack (TIA), and cerebral infarction without residual deficits: Secondary | ICD-10-CM | POA: Insufficient documentation

## 2023-10-23 DIAGNOSIS — E785 Hyperlipidemia, unspecified: Secondary | ICD-10-CM | POA: Diagnosis not present

## 2023-10-23 DIAGNOSIS — I10 Essential (primary) hypertension: Secondary | ICD-10-CM | POA: Insufficient documentation

## 2023-10-23 DIAGNOSIS — F32A Depression, unspecified: Secondary | ICD-10-CM | POA: Diagnosis not present

## 2023-10-23 DIAGNOSIS — K219 Gastro-esophageal reflux disease without esophagitis: Secondary | ICD-10-CM | POA: Diagnosis not present

## 2023-10-23 DIAGNOSIS — R8281 Pyuria: Secondary | ICD-10-CM

## 2023-10-23 DIAGNOSIS — Z01812 Encounter for preprocedural laboratory examination: Secondary | ICD-10-CM

## 2023-10-23 DIAGNOSIS — I4819 Other persistent atrial fibrillation: Secondary | ICD-10-CM | POA: Insufficient documentation

## 2023-10-23 DIAGNOSIS — J4540 Moderate persistent asthma, uncomplicated: Secondary | ICD-10-CM | POA: Diagnosis not present

## 2023-10-23 DIAGNOSIS — Z96652 Presence of left artificial knee joint: Secondary | ICD-10-CM

## 2023-10-23 HISTORY — DX: Other intervertebral disc degeneration, lumbar region without mention of lumbar back pain or lower extremity pain: M51.369

## 2023-10-23 HISTORY — DX: Personal history of transient ischemic attack (TIA), and cerebral infarction without residual deficits: Z86.73

## 2023-10-23 HISTORY — DX: Long term (current) use of anticoagulants: Z79.01

## 2023-10-23 HISTORY — DX: Unspecified osteoarthritis, unspecified site: M19.90

## 2023-10-23 HISTORY — PX: TOTAL KNEE ARTHROPLASTY: SHX125

## 2023-10-23 HISTORY — DX: Prediabetes: R73.03

## 2023-10-23 HISTORY — DX: Synovial cyst of popliteal space (Baker), left knee: M71.22

## 2023-10-23 HISTORY — DX: Other cervical disc degeneration, unspecified cervical region: M50.30

## 2023-10-23 HISTORY — DX: Other cerebrovascular disease: I67.89

## 2023-10-23 SURGERY — ARTHROPLASTY, KNEE, TOTAL
Anesthesia: General | Site: Knee | Laterality: Left

## 2023-10-23 MED ORDER — CEFAZOLIN SODIUM-DEXTROSE 2-4 GM/100ML-% IV SOLN
INTRAVENOUS | Status: AC
  Filled 2023-10-23: qty 100

## 2023-10-23 MED ORDER — SODIUM CHLORIDE 0.9 % IR SOLN
Status: DC | PRN
  Administered 2023-10-23: 3000 mL

## 2023-10-23 MED ORDER — METOCLOPRAMIDE HCL 5 MG/ML IJ SOLN
5.0000 mg | Freq: Three times a day (TID) | INTRAMUSCULAR | Status: DC | PRN
Start: 2023-10-23 — End: 2023-10-24

## 2023-10-23 MED ORDER — PROPOFOL 1000 MG/100ML IV EMUL
INTRAVENOUS | Status: AC
  Filled 2023-10-23: qty 100

## 2023-10-23 MED ORDER — TRANEXAMIC ACID-NACL 1000-0.7 MG/100ML-% IV SOLN
INTRAVENOUS | Status: AC
  Filled 2023-10-23: qty 100

## 2023-10-23 MED ORDER — LOSARTAN POTASSIUM 50 MG PO TABS
50.0000 mg | ORAL_TABLET | Freq: Every day | ORAL | Status: DC
Start: 2023-10-24 — End: 2023-10-24

## 2023-10-23 MED ORDER — FENTANYL CITRATE (PF) 100 MCG/2ML IJ SOLN
INTRAMUSCULAR | Status: DC | PRN
  Administered 2023-10-23: 50 ug via INTRAVENOUS

## 2023-10-23 MED ORDER — FENTANYL CITRATE (PF) 100 MCG/2ML IJ SOLN
INTRAMUSCULAR | Status: AC
  Filled 2023-10-23: qty 2

## 2023-10-23 MED ORDER — ONDANSETRON HCL 4 MG/2ML IJ SOLN
INTRAMUSCULAR | Status: DC | PRN
  Administered 2023-10-23: 4 mg via INTRAVENOUS

## 2023-10-23 MED ORDER — EPHEDRINE SULFATE-NACL 50-0.9 MG/10ML-% IV SOSY
PREFILLED_SYRINGE | INTRAVENOUS | Status: DC | PRN
  Administered 2023-10-23 (×4): 5 mg via INTRAVENOUS

## 2023-10-23 MED ORDER — TRIAMCINOLONE ACETONIDE 40 MG/ML IJ SUSP
INTRAMUSCULAR | Status: AC
  Filled 2023-10-23: qty 2

## 2023-10-23 MED ORDER — DIPHENHYDRAMINE HCL 12.5 MG/5ML PO ELIX: 12.5000 mg | ORAL_SOLUTION | ORAL | Status: DC | PRN

## 2023-10-23 MED ORDER — SODIUM CHLORIDE FLUSH 0.9 % IV SOLN
INTRAVENOUS | Status: AC
  Filled 2023-10-23: qty 40

## 2023-10-23 MED ORDER — ORAL CARE MOUTH RINSE: 15.0000 mL | Freq: Once | OROMUCOSAL | Status: AC

## 2023-10-23 MED ORDER — POLYVINYL ALCOHOL 1.4 % OP SOLN: 1.0000 [drp] | Freq: Every day | OPHTHALMIC | Status: DC | PRN

## 2023-10-23 MED ORDER — ACETAMINOPHEN 500 MG PO TABS
1000.0000 mg | ORAL_TABLET | Freq: Four times a day (QID) | ORAL | Status: DC
  Administered 2023-10-23 – 2023-10-24 (×3): 1000 mg via ORAL

## 2023-10-23 MED ORDER — CHLORHEXIDINE GLUCONATE 0.12 % MT SOLN
OROMUCOSAL | Status: AC
  Filled 2023-10-23: qty 15

## 2023-10-23 MED ORDER — APIXABAN 5 MG PO TABS
5.0000 mg | ORAL_TABLET | Freq: Two times a day (BID) | ORAL | Status: DC
  Administered 2023-10-24: 5 mg via ORAL
  Filled 2023-10-23: qty 1

## 2023-10-23 MED ORDER — OXYCODONE HCL 5 MG PO TABS
ORAL_TABLET | ORAL | Status: AC
  Filled 2023-10-23: qty 1

## 2023-10-23 MED ORDER — KETOROLAC TROMETHAMINE 30 MG/ML IJ SOLN
INTRAMUSCULAR | Status: DC | PRN
  Administered 2023-10-23: 15 mg via INTRAMUSCULAR

## 2023-10-23 MED ORDER — ADULT MULTIVITAMIN W/MINERALS CH
1.0000 | ORAL_TABLET | Freq: Every morning | ORAL | Status: DC
Start: 2023-10-24 — End: 2023-10-24
  Administered 2023-10-24: 1 via ORAL

## 2023-10-23 MED ORDER — ONDANSETRON HCL 4 MG PO TABS: 4.0000 mg | ORAL_TABLET | Freq: Four times a day (QID) | ORAL | Status: DC | PRN

## 2023-10-23 MED ORDER — DOCUSATE SODIUM 100 MG PO CAPS
ORAL_CAPSULE | ORAL | Status: AC
Start: 2023-10-23 — End: ?
  Filled 2023-10-23: qty 1

## 2023-10-23 MED ORDER — BUDESONIDE 0.25 MG/2ML IN SUSP
0.2500 mg | Freq: Two times a day (BID) | RESPIRATORY_TRACT | Status: DC
  Filled 2023-10-23 (×2): qty 2

## 2023-10-23 MED ORDER — KETOROLAC TROMETHAMINE 30 MG/ML IJ SOLN
INTRAMUSCULAR | Status: AC
  Filled 2023-10-23: qty 2

## 2023-10-23 MED ORDER — PANTOPRAZOLE SODIUM 40 MG PO TBEC
40.0000 mg | DELAYED_RELEASE_TABLET | Freq: Every day | ORAL | Status: DC
  Administered 2023-10-24: 40 mg via ORAL

## 2023-10-23 MED ORDER — ONDANSETRON HCL 4 MG/2ML IJ SOLN: 4.0000 mg | Freq: Four times a day (QID) | INTRAMUSCULAR | Status: DC | PRN

## 2023-10-23 MED ORDER — ALBUTEROL SULFATE (2.5 MG/3ML) 0.083% IN NEBU: 2.5000 mg | INHALATION_SOLUTION | Freq: Four times a day (QID) | RESPIRATORY_TRACT | Status: DC | PRN

## 2023-10-23 MED ORDER — ACETAMINOPHEN 10 MG/ML IV SOLN
1000.0000 mg | Freq: Once | INTRAVENOUS | Status: DC | PRN
  Administered 2023-10-23: 1000 mg via INTRAVENOUS

## 2023-10-23 MED ORDER — TRANEXAMIC ACID-NACL 1000-0.7 MG/100ML-% IV SOLN: 1000.0000 mg | INTRAVENOUS | Status: DC

## 2023-10-23 MED ORDER — CITALOPRAM HYDROBROMIDE 20 MG PO TABS
20.0000 mg | ORAL_TABLET | Freq: Every morning | ORAL | Status: DC
Start: 2023-10-24 — End: 2023-10-24
  Administered 2023-10-24: 20 mg via ORAL
  Filled 2023-10-23: qty 1

## 2023-10-23 MED ORDER — CEFAZOLIN SODIUM-DEXTROSE 2-4 GM/100ML-% IV SOLN
2.0000 g | Freq: Four times a day (QID) | INTRAVENOUS | Status: AC
  Administered 2023-10-23 – 2023-10-24 (×2): 2 g via INTRAVENOUS

## 2023-10-23 MED ORDER — LORATADINE 10 MG PO TABS: 10.0000 mg | ORAL_TABLET | Freq: Every day | ORAL | Status: DC | PRN

## 2023-10-23 MED ORDER — ACETAMINOPHEN 325 MG PO TABS: 325.0000 mg | ORAL_TABLET | Freq: Four times a day (QID) | ORAL | Status: DC | PRN

## 2023-10-23 MED ORDER — CHLORHEXIDINE GLUCONATE 0.12 % MT SOLN
15.0000 mL | Freq: Once | OROMUCOSAL | Status: AC
  Administered 2023-10-23: 15 mL via OROMUCOSAL

## 2023-10-23 MED ORDER — AMLODIPINE BESYLATE 5 MG PO TABS
10.0000 mg | ORAL_TABLET | Freq: Every day | ORAL | Status: DC
  Administered 2023-10-24: 10 mg via ORAL

## 2023-10-23 MED ORDER — FLEET ENEMA RE ENEM: 1.0000 | ENEMA | Freq: Once | RECTAL | Status: DC | PRN

## 2023-10-23 MED ORDER — OXYCODONE HCL 5 MG/5ML PO SOLN: 5.0000 mg | Freq: Once | ORAL | Status: AC | PRN

## 2023-10-23 MED ORDER — BUPIVACAINE HCL (PF) 0.5 % IJ SOLN
INTRAMUSCULAR | Status: DC | PRN
  Administered 2023-10-23: 2.4 mL

## 2023-10-23 MED ORDER — ACETAMINOPHEN 10 MG/ML IV SOLN
INTRAVENOUS | Status: AC
  Filled 2023-10-23: qty 100

## 2023-10-23 MED ORDER — FENTANYL CITRATE (PF) 100 MCG/2ML IJ SOLN
25.0000 ug | INTRAMUSCULAR | Status: DC | PRN
  Administered 2023-10-23 (×4): 25 ug via INTRAVENOUS
  Administered 2023-10-23: 50 ug via INTRAVENOUS

## 2023-10-23 MED ORDER — SODIUM CHLORIDE 0.9 % IV SOLN: INTRAVENOUS | Status: DC | PRN

## 2023-10-23 MED ORDER — TRANEXAMIC ACID-NACL 1000-0.7 MG/100ML-% IV SOLN
INTRAVENOUS | Status: DC | PRN
  Administered 2023-10-23: 1000 mg via INTRAVENOUS

## 2023-10-23 MED ORDER — MAGNESIUM HYDROXIDE 400 MG/5ML PO SUSP: 30.0000 mL | Freq: Every day | ORAL | Status: DC | PRN

## 2023-10-23 MED ORDER — ATORVASTATIN CALCIUM 20 MG PO TABS
ORAL_TABLET | ORAL | Status: AC
  Filled 2023-10-23: qty 1

## 2023-10-23 MED ORDER — ACETAMINOPHEN 500 MG PO TABS
ORAL_TABLET | ORAL | Status: AC
  Filled 2023-10-23: qty 2

## 2023-10-23 MED ORDER — BISACODYL 10 MG RE SUPP: 10.0000 mg | Freq: Every day | RECTAL | Status: DC | PRN

## 2023-10-23 MED ORDER — LACTATED RINGERS IV SOLN: INTRAVENOUS | Status: AC

## 2023-10-23 MED ORDER — PROPOFOL 500 MG/50ML IV EMUL
INTRAVENOUS | Status: DC | PRN
  Administered 2023-10-23: 75 ug/kg/min via INTRAVENOUS

## 2023-10-23 MED ORDER — SODIUM CHLORIDE 0.9 % IV SOLN: INTRAVENOUS | Status: DC

## 2023-10-23 MED ORDER — FESOTERODINE FUMARATE ER 4 MG PO TB24
4.0000 mg | ORAL_TABLET | Freq: Every day | ORAL | Status: DC
  Administered 2023-10-24: 4 mg via ORAL
  Filled 2023-10-23: qty 1

## 2023-10-23 MED ORDER — METOCLOPRAMIDE HCL 10 MG PO TABS: 5.0000 mg | ORAL_TABLET | Freq: Three times a day (TID) | ORAL | Status: DC | PRN

## 2023-10-23 MED ORDER — OXYCODONE HCL 5 MG PO TABS
5.0000 mg | ORAL_TABLET | Freq: Once | ORAL | Status: AC | PRN
  Administered 2023-10-23: 5 mg via ORAL

## 2023-10-23 MED ORDER — TRIAMCINOLONE ACETONIDE 40 MG/ML IJ SUSP
INTRAMUSCULAR | Status: DC | PRN
  Administered 2023-10-23: 80 mg via INTRAMUSCULAR

## 2023-10-23 MED ORDER — HYDROCHLOROTHIAZIDE 25 MG PO TABS
25.0000 mg | ORAL_TABLET | Freq: Every morning | ORAL | Status: DC
  Administered 2023-10-24: 25 mg via ORAL

## 2023-10-23 MED ORDER — SODIUM CHLORIDE 0.9 % IV SOLN
INTRAVENOUS | Status: DC | PRN
  Administered 2023-10-23: 60 mL

## 2023-10-23 MED ORDER — DOCUSATE SODIUM 100 MG PO CAPS
100.0000 mg | ORAL_CAPSULE | Freq: Two times a day (BID) | ORAL | Status: DC
  Administered 2023-10-23 – 2023-10-24 (×2): 100 mg via ORAL

## 2023-10-23 MED ORDER — OXYCODONE HCL 5 MG PO TABS
5.0000 mg | ORAL_TABLET | ORAL | Status: DC | PRN
  Administered 2023-10-23 – 2023-10-24 (×3): 5 mg via ORAL

## 2023-10-23 MED ORDER — ONDANSETRON HCL 4 MG/2ML IJ SOLN: 4.0000 mg | Freq: Once | INTRAMUSCULAR | Status: DC | PRN

## 2023-10-23 MED ORDER — CEFAZOLIN SODIUM-DEXTROSE 2-4 GM/100ML-% IV SOLN
2.0000 g | INTRAVENOUS | Status: AC
  Administered 2023-10-23: 2 g via INTRAVENOUS

## 2023-10-23 MED ORDER — LACTATED RINGERS IV SOLN: INTRAVENOUS | Status: DC

## 2023-10-23 MED ORDER — BUPIVACAINE-EPINEPHRINE (PF) 0.5% -1:200000 IJ SOLN
INTRAMUSCULAR | Status: AC
  Filled 2023-10-23: qty 30

## 2023-10-23 MED ORDER — ATORVASTATIN CALCIUM 20 MG PO TABS
20.0000 mg | ORAL_TABLET | Freq: Every day | ORAL | Status: DC
  Administered 2023-10-23: 20 mg via ORAL

## 2023-10-23 MED ORDER — FLUTICASONE FUROATE 50 MCG/ACT IN AEPB: 1.0000 | INHALATION_SPRAY | Freq: Every morning | RESPIRATORY_TRACT | Status: DC

## 2023-10-23 MED ORDER — BUPIVACAINE-EPINEPHRINE (PF) 0.5% -1:200000 IJ SOLN
INTRAMUSCULAR | Status: DC | PRN
  Administered 2023-10-23: 30 mL

## 2023-10-23 SURGICAL SUPPLY — 57 items
BLADE SAW 90X13X1.19 OSCILLAT (BLADE) ×1 IMPLANT
BLADE SAW SAG 25X90X1.19 (BLADE) ×1 IMPLANT
BLADE SURG SZ20 CARB STEEL (BLADE) ×1 IMPLANT
BNDG ELASTIC 6X5.8 VLCR NS LF (GAUZE/BANDAGES/DRESSINGS) ×1 IMPLANT
CEMENT BONE R 1X40 (Cement) ×2 IMPLANT
CEMENT VACUUM MIXING SYSTEM (MISCELLANEOUS) ×1 IMPLANT
CHLORAPREP W/TINT 26 (MISCELLANEOUS) ×1 IMPLANT
COMP FEM CMT PS STD 5 LT (Joint) ×1 IMPLANT
COMP TIB CMT KNEE C NP LT (Joint) ×1 IMPLANT
COMPONENT FEM CMT PS STD 5 LT (Joint) IMPLANT
COMPONENT TIB CMT KNEE C NP LT (Joint) IMPLANT
COOLER POLAR GLACIER W/PUMP (MISCELLANEOUS) ×1 IMPLANT
COVER MAYO STAND STRL (DRAPES) ×1 IMPLANT
CUFF TRNQT CYL 24X4X16.5-23 (TOURNIQUET CUFF) IMPLANT
CUFF TRNQT CYL 34X4.125X (TOURNIQUET CUFF) IMPLANT
DRAPE IMP U-DRAPE 54X76 (DRAPES) ×1 IMPLANT
DRAPE SHEET LG 3/4 BI-LAMINATE (DRAPES) ×1 IMPLANT
DRAPE U-SHAPE 47X51 STRL (DRAPES) ×1 IMPLANT
DRSG MEPILEX SACRM 8.7X9.8 (GAUZE/BANDAGES/DRESSINGS) IMPLANT
DRSG OPSITE POSTOP 4X10 (GAUZE/BANDAGES/DRESSINGS) ×1 IMPLANT
DRSG OPSITE POSTOP 4X8 (GAUZE/BANDAGES/DRESSINGS) ×1 IMPLANT
ELECT CAUTERY BLADE 6.4 (BLADE) ×1 IMPLANT
ELECT REM PT RETURN 9FT ADLT (ELECTROSURGICAL) ×1
ELECTRODE REM PT RTRN 9FT ADLT (ELECTROSURGICAL) ×1 IMPLANT
GAUZE XEROFORM 1X8 LF (GAUZE/BANDAGES/DRESSINGS) ×1 IMPLANT
GLOVE BIO SURGEON STRL SZ7.5 (GLOVE) ×4 IMPLANT
GLOVE BIO SURGEON STRL SZ8 (GLOVE) ×4 IMPLANT
GLOVE BIOGEL PI IND STRL 8 (GLOVE) ×1 IMPLANT
GLOVE INDICATOR 8.0 STRL GRN (GLOVE) ×1 IMPLANT
GOWN STRL REUS W/ TWL LRG LVL3 (GOWN DISPOSABLE) ×1 IMPLANT
GOWN STRL REUS W/ TWL XL LVL3 (GOWN DISPOSABLE) ×1 IMPLANT
HOOD PEEL AWAY T7 (MISCELLANEOUS) ×3 IMPLANT
INSERT ASF VE PS 13 6-7/CD LT (Insert) IMPLANT
IV NS IRRIG 3000ML ARTHROMATIC (IV SOLUTION) ×1 IMPLANT
KIT TURNOVER KIT A (KITS) ×1 IMPLANT
MANIFOLD NEPTUNE II (INSTRUMENTS) ×1 IMPLANT
NDL SPNL 20GX3.5 QUINCKE YW (NEEDLE) ×1 IMPLANT
NEEDLE SPNL 20GX3.5 QUINCKE YW (NEEDLE) ×1 IMPLANT
NS IRRIG 500ML POUR BTL (IV SOLUTION) ×1 IMPLANT
PACK TOTAL KNEE (MISCELLANEOUS) ×1 IMPLANT
PAD WRAPON POLAR KNEE (MISCELLANEOUS) ×1 IMPLANT
PENCIL SMOKE EVACUATOR (MISCELLANEOUS) ×1 IMPLANT
PIN DRILL HDLS TROCAR 75 4PK (PIN) IMPLANT
PULSAVAC PLUS IRRIG FAN TIP (DISPOSABLE) ×1
SCREW FEMALE HEX FIX 25X2.5 (ORTHOPEDIC DISPOSABLE SUPPLIES) IMPLANT
STAPLER SKIN PROX 35W (STAPLE) ×1 IMPLANT
STEM POLY PAT PLY 32M KNEE (Knees) IMPLANT
SUCTION TUBE FRAZIER 10FR DISP (SUCTIONS) ×1 IMPLANT
SUT VIC AB 0 CT1 36 (SUTURE) ×3 IMPLANT
SUT VIC AB 2-0 CT1 TAPERPNT 27 (SUTURE) ×3 IMPLANT
SYR 10ML LL (SYRINGE) ×1 IMPLANT
SYR 20ML LL LF (SYRINGE) ×1 IMPLANT
SYR 30ML LL (SYRINGE) IMPLANT
TIP FAN IRRIG PULSAVAC PLUS (DISPOSABLE) ×1 IMPLANT
TRAP FLUID SMOKE EVACUATOR (MISCELLANEOUS) ×1 IMPLANT
WATER STERILE IRR 500ML POUR (IV SOLUTION) ×1 IMPLANT
WRAPON POLAR PAD KNEE (MISCELLANEOUS) ×1

## 2023-10-23 NOTE — Anesthesia Procedure Notes (Signed)
Spinal  Patient location during procedure: OR Start time: 10/23/2023 12:33 PM End time: 10/23/2023 12:38 PM Staffing Performed: resident/CRNA  Resident/CRNA: Lanell Matar, CRNA Performed by: Lanell Matar, CRNA Authorized by: Reed Breech, MD   Preanesthetic Checklist Completed: patient identified, IV checked, site marked, risks and benefits discussed, surgical consent, monitors and equipment checked, pre-op evaluation and timeout performed Spinal Block Patient position: sitting Prep: ChloraPrep Patient monitoring: heart rate, cardiac monitor, continuous pulse ox and blood pressure Approach: midline Location: L3-4 Injection technique: single-shot Needle Needle type: Pencan  Needle gauge: 24 G Needle length: 10 cm Assessment Sensory level: T10 Events: CSF return

## 2023-10-23 NOTE — Discharge Summary (Signed)
Physician Discharge Summary  Patient ID: Emma Rivera MRN: [id number] DOB/AGE: 13-Feb-1941 82 y.o.  Admit date: 10/23/2023 Discharge date: 10/24/2023  Admission Diagnoses:  Status post total knee replacement using cement, left [Z96.652] Left knee degenerative joint disease  Discharge Diagnoses: Patient Active Problem List   Diagnosis Date Noted   Status post total knee replacement using cement, left 10/23/2023   Hypercoagulable state due to persistent atrial fibrillation (HCC) 07/19/2023   History of stroke 01/04/2023   Stable angina (HCC) 03/02/2021   Chest pain of uncertain etiology 02/11/2020   PVC's (premature ventricular contractions) 08/14/2019   Dyspnea on exertion 03/07/2019   Persistent atrial fibrillation (HCC) 12/18/2018   Essential hypertension 12/18/2018   Hyperlipidemia LDL goal <70 12/18/2018   Prediabetes 12/18/2018   Obesity (BMI 30.0-34.9) 12/18/2018   CVA (cerebral vascular accident) (HCC) - L frontal embolic d/t AF s/p IV TPA 12/16/2018    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   a.) CTA head/neck showed emergent M2 occlusion; CT head negative --> neurology consulted and recommended TPA and transfer to [facility] for possible thrombectomy; b.) repeat CT at [facility] revealed interval resolution of M2 occlusion --> admitted; c.) MRI brain 12/17/2018: acute LEFT MCA infarct with 3 punctate areas of restricted diffusion (largest area involved the LEFT insular cortex)   Atrial fibrillation (HCC) 12/16/2018   a.) new onset 12/16/2018; b.) CHA2DS2-VASc = 6 (age x2, sex, HTN, CVA x2) as of 10/19/2023; c.) s/p DCCV (120 J x 1) with ERAF 04/01/2019; d.) s/p ablation 06/13/2019; e.) s/p DCCV (150 J x1) 03/12/2023; f.) s/p repeat ablation 06/21/2023; g.) cardiac rate/rhythm currently maintained instrinsically without pharmacological intervention; chronically anticoagulated using standard dose apixaban   Baker's cyst of knee, left     Cerebral microvascular disease    DDD (degenerative disc disease), cervical    DDD (degenerative disc disease), lumbar    Depression    Diastolic dysfunction 03/23/2023   a.) TTE 03/23/2023: EF 60-65%, no RWMAs, G2DD, RVSF/RVSP normal, mod LAE, mild RAE, AoV sclerosis with no stenosis   Diverticulosis    Esophageal spasm    Essential hypertension    GERD (gastroesophageal reflux disease)    Heart murmur    History of bilateral cataract extraction 2016   History of kidney stones    History of stroke involving cerebellum    a.) date of neurovascular event unknown; infarct of the posterior inferior LEFT cerebellum noted on MRI brain done on 12/17/2018 performed in setting of acute LEFT MCA infarct   HOH (hard of hearing)    Hyperlipidemia    Moderate persistent asthma    NSVT (nonsustained ventricular tachycardia) (HCC) 01/21/2019   a.) Zio 01/21/2019: 2 runs NSVT lasting up to 18 beats at max rate of 174 bpm;  up to 4.6 second post-termination pauses   On apixaban therapy    Osteoarthritis    Prediabetes    PSVT (paroxysmal supraventricular tachycardia) (HCC) 10/09/2023   a.) Zio 10/09/2023: 37 runs each lasting < 17 seconds (max rate 210 bpm)   Transfusion: None.   Consultants (if any):   Discharged Condition: Improved  Hospital Course: ANNMARGARET CARUSONE is an 82 y.o. female who was admitted 10/23/2023 with a diagnosis of left knee osteoarthritis and went to the operating room on 10/23/2023 and underwent the above named procedures.    Surgeries: Procedure(s): TOTAL KNEE ARTHROPLASTY on 10/23/2023 Patient tolerated the surgery well. Taken to PACU where she was stabilized and then  transferred to the post-op recovery area.  She was re-started on her chronic Eliquis 5mg  BID. Heels elevated on bed with rolled towels. No evidence of DVT. Negative Homan. Physical therapy started on day #1 for gait training and transfer. OT started day #1 for ADL and assisted devices.  Patient's IV was  removed on POD1.  Implants: Left TKA using all-cemented Zimmer Persona system with a #5 PCR femur, a(n) C-sized  tibial tray with a 13 mm medial congruent E-poly insert, and a 32 x 8.5 mm all-poly 3-pegged domed patella.   She was given perioperative antibiotics:  Anti-infectives (From admission, onward)    Start     Dose/Rate Route Frequency Ordered Stop   10/23/23 1800  ceFAZolin (ANCEF) IVPB 2g/100 mL premix        2 g 200 mL/hr over 30 Minutes Intravenous Every 6 hours 10/23/23 1704 10/24/23 0110   10/23/23 1100  ceFAZolin (ANCEF) IVPB 2g/100 mL premix        2 g 200 mL/hr over 30 Minutes Intravenous On call to O.R. 10/23/23 1058 10/23/23 1233     .  She was given sequential compression devices, early ambulation, and Eliquis for DVT prophylaxis.  She benefited maximally from the hospital stay and there were no complications.    Recent vital signs:  Vitals:   10/24/23 0031 10/24/23 0413  BP: (!) 136/58 (!) 146/61  Pulse: 70 75  Resp: 16 16  Temp: 97.7 F (36.5 C) 97.8 F (36.6 C)  SpO2: 96% 95%    Recent laboratory studies:  Lab Results  Component Value Date   HGB 12.6 10/18/2023   HGB 13.1 06/01/2023   HGB 14.1 03/23/2023   Lab Results  Component Value Date   WBC 7.0 10/18/2023   PLT 244 10/18/2023   Lab Results  Component Value Date   INR 1.06 12/16/2018   Lab Results  Component Value Date   NA 141 10/18/2023   K 3.7 10/18/2023   CL 103 10/18/2023   CO2 29 10/18/2023   BUN 23 10/18/2023   CREATININE 1.06 (H) 10/18/2023   GLUCOSE 85 10/18/2023    Discharge Medications:   Allergies as of 10/24/2023       Reactions   Imdur [isosorbide Nitrate] Other (See Comments)   Hair loss   Sulfa Antibiotics Nausea Only        Medication List     TAKE these medications    acetaminophen 500 MG tablet Commonly known as: TYLENOL Take 1,000 mg by mouth every 6 (six) hours as needed for moderate pain or headache.   albuterol 108 (90 Base) MCG/ACT  inhaler Commonly known as: VENTOLIN HFA Inhale 1-2 puffs into the lungs every 6 (six) hours as needed (exercise/wheezing/shortness of breath.).   amLODipine 10 MG tablet Commonly known as: NORVASC Take 1 tablet (10 mg total) by mouth daily. Please schedule appointment for further refills. 1st attempt.   apixaban 5 MG Tabs tablet Commonly known as: ELIQUIS Take 1 tablet (5 mg total) by mouth 2 (two) times daily.   atorvastatin 20 MG tablet Commonly known as: LIPITOR TAKE 1 TABLET (20 MG TOTAL) BY MOUTH DAILY AT 6 PM.   citalopram 20 MG tablet Commonly known as: CELEXA Take 20 mg by mouth in the morning.   Fluticasone Furoate 50 MCG/ACT Aepb Inhale 1 puff into the lungs in the morning. ARNUITY ELLIPTA   hydrochlorothiazide 25 MG tablet Commonly known as: HYDRODIURIL Take 25 mg by mouth in the morning.   loratadine  10 MG tablet Commonly known as: CLARITIN Take 10 mg by mouth daily as needed for allergies.   losartan 50 MG tablet Commonly known as: COZAAR Take 50 mg by mouth daily.   LUBRICATING EYE DROPS OP Place 1 drop into both eyes daily as needed (dry eyes).   multivitamin tablet Take 1 tablet by mouth in the morning.   omeprazole 20 MG capsule Commonly known as: PRILOSEC Take 20 mg by mouth daily before breakfast.   ondansetron 4 MG tablet Commonly known as: ZOFRAN Take 1 tablet (4 mg total) by mouth every 6 (six) hours as needed for nausea.   oxyCODONE 5 MG immediate release tablet Commonly known as: Oxy IR/ROXICODONE Take 1-2 tablets (5-10 mg total) by mouth every 4 (four) hours as needed for moderate pain (pain score 4-6).   tolterodine 2 MG tablet Commonly known as: DETROL Take 2 mg by mouth 2 (two) times daily.               Durable Medical Equipment  (From admission, onward)           Start     Ordered   10/23/23 1705  DME Bedside commode  Once       Question:  Patient needs a bedside commode to treat with the following condition   Answer:  Status post total knee replacement using cement, left   10/23/23 1704   10/23/23 1705  DME 3 n 1  Once        10/23/23 1704   10/23/23 1705  DME Walker rolling  Once       Question Answer Comment  Walker: With 5 Inch Wheels   Patient needs a walker to treat with the following condition Status post total knee replacement using cement, left      10/23/23 1704            Diagnostic Studies: DG Knee Left Port  Result Date: 10/23/2023 CLINICAL DATA:  Status post total left knee arthroplasty using cement, left. EXAM: PORTABLE LEFT KNEE - 1-2 VIEW COMPARISON:  MRI left knee 08/10/2023 FINDINGS: Interval total left knee arthroplasty. No perihardware lucency is seen to indicate hardware failure or loosening. Expected postop changes including small joint effusion, intra-articular air, and mild anterior subcutaneous air. A surgical drain is seen anteriorly. Anterior surgical skin staples. No acute fracture or dislocation. IMPRESSION: Interval total left knee arthroplasty without evidence of hardware failure. Electronically Signed   By: Neita Garnet M.D.   On: 10/23/2023 16:45   LONG TERM MONITOR (3-14 DAYS)  Result Date: 10/10/2023 Patch Wear Time:  2 days and 23 hours Patient had a min HR of 51 bpm, max HR of 210 bpm, and avg HR of 64 bpm. Predominant underlying rhythm was Sinus Rhythm. 37 Supraventricular Tachycardia runs occurred, all less than 17 beats 3.3% supraventricular ectopy Less than 1% ventricular ectopy No patient triggered episodes recorded Will Camnitz, MD   Disposition: Plan for discharge home today with HHPT pending progress with PT.     Follow-up Information     Anson [place], PA-C Follow up in 14 day(s).   Specialty: Physician Assistant Why: Mindi Slicker information: [address] [place] [address] [phone]                Signed: Meriel Pica PA-C 10/24/2023, 7:59 AM

## 2023-10-23 NOTE — Discharge Instructions (Signed)

## 2023-10-23 NOTE — Anesthesia Postprocedure Evaluation (Signed)
Anesthesia Post Note  Patient: Emma Rivera  Procedure(s) Performed: TOTAL KNEE ARTHROPLASTY (Left: Knee)  Patient location during evaluation: PACU Anesthesia Type: Spinal Level of consciousness: oriented and awake and alert Pain management: pain level controlled Vital Signs Assessment: post-procedure vital signs reviewed and stable Respiratory status: spontaneous breathing, respiratory function stable and patient connected to nasal cannula oxygen Cardiovascular status: blood pressure returned to baseline and stable Postop Assessment: no headache, no backache, no apparent nausea or vomiting and spinal receding Anesthetic complications: no   No notable events documented.   Last Vitals:  Vitals:   10/23/23 1545 10/23/23 1550  BP: (!) 127/56   Pulse: 68 67  Resp: (!) 25 14  Temp:    SpO2: 93% 90%    Last Pain:  Vitals:   10/23/23 1540  TempSrc:   PainSc: 3                  Reed Breech

## 2023-10-23 NOTE — H&P (Signed)
History of Present Illness: Emma Rivera is a 82 y.o.female who presents today for her surgical history and physical for upcoming left total knee arthroplasty scheduled with Dr. Joice Lofts on 10/23/2023. The patient denies any changes in her medical history since she was last evaluated. The patient denies any trauma or injury affecting the right knee since her last evaluation. Pain score today is a 5 out of 10 to the left knee. She denies any numbness or tingling to the left lower extremity. The patient does have a history of atrial fibrillation, she is on chronic Eliquis because of this. The patient also has a history of asthma and does have a inhaler that she uses routinely. The patient does have a history of stroke in the past. She denies any history of DVT, denies any history of diabetes.  Current Outpatient Medications:  acetaminophen (TYLENOL) 500 MG tablet Take 1,000 mg by mouth every 8 (eight) hours as needed  amLODIPine (NORVASC) 10 MG tablet take 1 tablet by mouth every day 90 tablet 1  atorvastatin (LIPITOR) 20 MG tablet take 1 tablet by mouth every day 90 tablet 1  citalopram (CELEXA) 20 MG tablet Take 1 tablet by mouth once daily 90 tablet 1  ELIQUIS 5 mg tablet take 1 tablet by mouth every 12 hours 180 tablet 1  flecainide (TAMBOCOR) 100 MG tablet Take 100 mg by mouth 2 (two) times daily  fluticasone furoate (ARNUITY ELLIPTA) 100 mcg/actuation DsDv Inhale 1 Puff (1 spray total) into the lungs once daily 3 each 3  hydroCHLOROthiazide (HYDRODIURIL) 25 MG tablet take 1 tablet by mouth every day 100 tablet 1  loratadine (CLARITIN) 10 mg tablet Take 10 mg by mouth once daily  losartan (COZAAR) 50 MG tablet Take 1 tablet (50 mg total) by mouth once daily 100 tablet 1  mometasone (NASONEX) 50 mcg/actuation nasal spray Place 2 sprays into both nostrils once daily as needed  multivitamin tablet Take 1 tablet by mouth once daily.  omeprazole (PRILOSEC) 20 MG DR capsule TAKE 1 CAPSULE BY MOUTH EVERY DAY  90 capsule 1  tolterodine (DETROL) 2 MG tablet take 1 tablet by mouth twice a day 180 tablet 1  triamcinolone 0.1 % ointment Apply topically 2 (two) times daily as needed  VENTOLIN HFA 90 mcg/actuation inhaler INHALE 2 INHALATIONS INTO THE LUNGS 3 (THREE) TIMES DAILY AS NEEDED FOR WHEEZING VENTOLIN 18 each 10   Allergies:  Isosorbide Dinitrate Other (HA with imdur)  Sulfa (Sulfonamide Antibiotics) Nausea   Past Medical History:  Allergic state  Asthma, unspecified asthma severity, unspecified whether complicated, unspecified whether persistent (HHS-HCC)  Atrial fibrillation (CMS/HHS-HCC) 12/2018  Noted in hospital at time of stroke  Degeneration, intervertebral disc, lumbar  Depression  Essential hypertension  GERD (gastroesophageal reflux disease) - With gastritis  Heart murmur  History of chicken pox  History of stroke 12/16/2018  Obesity  Postmenopausal (on HRT)  Renal insufficiency (Cr 1 and GFR 54 -09/14/20) 09/23/2020  Urge incontinence   Past Surgical History:  COLONOSCOPY 01/02/2007 (Int Hemorrhoids, Diverticulosis: CBF 12/2016)  EGD 10/09/2007  CATARACT EXTRACTION 08/2015  COLONOSCOPY 04/04/2017 (Int Hemorrhoids, Diverticulosis: No repeat per RTE)  CORONARY ANGIOPLASTY  TONSILLECTOMY & ADENOIDECTOMY   Family History:  Coronary Artery Disease - Father Albin Fischer  Heart disease Father Albin Fischer  High blood pressure (Hypertension) Father Albin Fischer  Diabetes type II Father Albin Fischer  Coronary Artery Disease - Mother Unknown Jim  Heart disease Mother Unknown Jim  High blood pressure (Hypertension) Mother Unknown Jim  Stroke Mother Unknown Jim  Depression Mother Unknown Jim  Coronary Artery Disease - Brother  Breast cancer Maternal Aunt Daphyne Jones  High blood pressure (Hypertension) Brother Ray  Coronary Artery Disease - Brother Ray   Social History:   Socioeconomic History:  Marital status: Married  Tobacco Use  Smoking status: Never   Passive exposure: Never  Smokeless tobacco: Never  Vaping Use  Vaping status: Never Used  Substance and Sexual Activity  Alcohol use: Not Currently  Comment: Glass of wine occasionally  Drug use: Never  Sexual activity: Not Currently  Partners: Male  Birth control/protection: None  Social History Narrative  Marital status- Married  Lives with husband  Employment- Retired  EtOH hx- None  Drug use- None  Supplements- MVI  Exercise hx- Goes to [employer]; walks  Religious affliation- Control and instrumentation engineer   Social Drivers of Health:   Physicist, medical Strain: Low Risk (10/08/2023)  Overall Financial Resource Strain (CARDIA)  Difficulty of Paying Living Expenses: Not hard at all  Food Insecurity: No Food Insecurity (10/08/2023)  Hunger Vital Sign  Worried About Running Out of Food in the Last Year: Never true  Ran Out of Food in the Last Year: Never true  Transportation Needs: No Transportation Needs (10/08/2023)  PRAPARE - Risk analyst (Medical): No  Lack of Transportation (Non-Medical): No   Review of Systems:  A comprehensive 14 point ROS was performed, reviewed, and the pertinent orthopaedic findings are documented in the HPI.  Physical Exam: Vitals:  10/08/23 1435  BP: 138/70  Weight: 81.3 kg (179 lb 3.2 oz)  Height: 157.5 cm (5\' 2" )  PainSc: 5  PainLoc: Knee   General/Constitutional: The patient appears to be well-nourished, well-developed, and in no acute distress. Neuro/Psych: Normal mood and affect, oriented to person, place and time. Eyes: Non-icteric. Pupils are equal, round, and reactive to light, and exhibit synchronous movement. Lymphatic: No palpable adenopathy. Respiratory: Lungs clear to auscultation, Normal chest excursion, and Non-labored breathing. Mild end expiratory wheeze noted bilaterally. Cardiovascular: Irregularly irregular rate and rhythm, but no murmurs and No edema, swelling or tenderness, except as noted in detailed  exam. Vascular: No edema, swelling or tenderness, except as noted in detailed exam. Integumentary: No impressive skin lesions present, except as noted in detailed exam. Musculoskeletal: Unremarkable, except as noted in detailed exam.  Left knee exam: GAIT: mild limp and uses no assistive devices. ALIGNMENT: normal SKIN: unremarkable SWELLING: mild EFFUSION: small WARMTH: no warmth TENDERNESS: moderate numbness over the lateral joint line, mild tenderness along medial joint line ROM: 0 to 110 degrees with pain in maximal flexion McMURRAY'S: equivocal PATELLOFEMORAL: normal tracking with no peri-patellar tenderness and negative apprehension sign CREPITUS: Mild patellofemoral crepitance LACHMAN'S: negative PIVOT SHIFT: Not evaluated ANTERIOR DRAWER: negative POSTERIOR DRAWER: negative VARUS/VALGUS: stable  She is neurovascularly intact to the left lower extremity and foot.  Knee Imaging: An AP weightbearing film, as well as lateral and merchant views of the left knee are available for review and have been reviewed by myself. These films demonstrate mild degenerative changes, primarily involving the lateral compartment with 40% lateral joint space narrowing, best seen on the lateral view. Overall alignment is neutral. No fractures, lytic lesions, or abnormal calcifications are noted.  Knee Imaging, external: Left knee: A recent MRI scan of the left knee is available for review and has been reviewed by myself. By report, the study demonstrates evidence of degenerative tearing of the medial and lateral menisci as well as moderate tricompartmental degenerative changes, most pronounced  in the low involving the lateral compartment. There appears to be evidence of "subchondral marrow edema along the lateral tibial plateau and lateral femoral condyle. Subcortical stress fracture versus nonfragmented osteochondral lesion of the lateral femoral condyle." There also is evidence of a large knee  effusion with synovitis as well as a Baker's cyst.   Assessment:  1. Primary osteoarthritis of left knee.  2. Degenerative tear of medial meniscus, left.  3. Stress fracture of lateral condyle of left femur.   Plan: 1. Treatment options were discussed today with the patient. 2. The patient is scheduled for a left total knee arthroplasty with Dr. Joice Lofts on 10/23/2023. 3. The risk and benefits of a left total knee arthroplasty were discussed in detail with the patient at today's appointment. 4. The patient was instructed to stop her Eliquis 3 days prior to surgery. 5. This document will serve as a surgical history and physical for the patient. 6. The patient can contact clinic if she has any questions, new symptoms develop or symptoms worsen.  The procedure was discussed with the patient, as were the potential risks (including bleeding, infection, nerve and/or blood vessel injury, persistent or recurrent pain, failure of the hardware, stiffness, need for further surgery, blood clots, strokes, heart attacks and/or arhythmias, pneumonia, etc.) and benefits. The patient states her understanding and wishes to proceed.    H&P reviewed and patient re-examined. No changes.

## 2023-10-23 NOTE — Transfer of Care (Signed)
Immediate Anesthesia Transfer of Care Note  Patient: Emma Rivera  Procedure(s) Performed: TOTAL KNEE ARTHROPLASTY (Left: Knee)  Patient Location: PACU  Anesthesia Type:General  Level of Consciousness: awake, drowsy, and patient cooperative  Airway & Oxygen Therapy: Patient Spontanous Breathing and Patient connected to face mask oxygen  Post-op Assessment: Report given to RN and Post -op Vital signs reviewed and stable  Post vital signs: Reviewed and stable  Last Vitals:  Vitals Value Taken Time  BP    Temp    Pulse 73 10/23/23 1503  Resp 17 10/23/23 1503  SpO2 98 % 10/23/23 1503  Vitals shown include unfiled device data.  Last Pain:  Vitals:   10/23/23 1104  TempSrc: Temporal  PainSc: 0-No pain         Complications: No notable events documented.

## 2023-10-23 NOTE — Anesthesia Preprocedure Evaluation (Addendum)
Anesthesia Evaluation  Patient identified by MRN, date of birth, ID band Patient awake    Reviewed: Allergy & Precautions, NPO status , Patient's Chart, lab work & pertinent test results  History of Anesthesia Complications Negative for: history of anesthetic complications  Airway Mallampati: III   Neck ROM: Full    Dental no notable dental hx.    Pulmonary asthma    Pulmonary exam normal breath sounds clear to auscultation       Cardiovascular hypertension, Normal cardiovascular exam+ dysrhythmias (a fib, persistent s/p ablation on Eliquis, last dose 10/19/23)  Rhythm:Regular Rate:Normal  ECG 09/24/23: low atrial/ectopic atrial rhythm, with short PR  Echo 03/23/23:  1. Left ventricular ejection fraction, by estimation, is 60 to 65%. The left ventricle has normal function. The left ventricle has no regional wall motion abnormalities. Left ventricular diastolic parameters are consistent with Grade II diastolic dysfunction (pseudonormalization).  2. Right ventricular systolic function is normal. The right ventricular size is normal. There is normal pulmonary artery systolic pressure.  3. Left atrial size was moderately dilated.  4. Right atrial size was mildly dilated.  5. The mitral valve is normal in structure. No evidence of mitral valve regurgitation. No evidence of mitral stenosis.  6. The aortic valve is normal in structure. Aortic valve regurgitation is not visualized. Aortic valve sclerosis/calcification is present, without any evidence of aortic stenosis.  7. The inferior vena cava is normal in size with greater than 50% respiratory variability, suggesting right atrial pressure of 3 mmHg.  Myocardial perfusion 07/18/19:  Pharmacological myocardial perfusion imaging study with no significant  ischemia Normal wall motion, EF estimated at 74% No EKG changes concerning for ischemia at peak stress or in recovery. Low risk scan     Neuro/Psych  PSYCHIATRIC DISORDERS  Depression    HOH CVA (2020), No Residual Symptoms    GI/Hepatic ,GERD  ,,  Endo/Other  Obesity   Renal/GU negative Renal ROS     Musculoskeletal   Abdominal   Peds  Hematology negative hematology ROS (+)   Anesthesia Other Findings Reviewed and agree with Emma Rivera pre-anesthesia clinical review note.    Cardiology note 10/18/23:  She reported no c/o CP, SOB, some palpitations post ablation,  Was in an ectopic atrial rhythm 59bpm with f/u monitor noting SR  No CAD by CT in 2022  Preserved LVEF by echo 2024  In review of my note, nothing reported to suggest any cardiac clinical changes that would require any additional testing or cardiac contraindication to knee surgery  RCRI score is one (0.9%)   Recommend resuming her OAC as soon as safe as per her surgeon    Per Dr. Elberta Fortis: OK to hold xarelto for three days.   Therefore, based on ACC/AHA guidelines, the patient would be at acceptable risk for the planned procedure without further cardiovascular testing.     Cardiology note 09/24/23:  1. Paroxysmal Afib CHA2DS2Vasc is 7, on Eliquis, appropriately dosed Some palpitations, AFib initially post ablation Ectopic rhythm today Sounds like she is having frequent PACs Plan for a 3 day monitor   2. HTN No change today   3. Secondary hypercoagulable state   Reproductive/Obstetrics                             Anesthesia Physical Anesthesia Plan  ASA: 3  Anesthesia Plan: General and Spinal   Post-op Pain Management:    Induction: Intravenous  PONV Risk Score and  Plan: 3 and Propofol infusion, TIVA, Treatment may vary due to age or medical condition and Ondansetron  Airway Management Planned: Natural Airway and Nasal Cannula  Additional Equipment:   Intra-op Plan:   Post-operative Plan:   Informed Consent: I have reviewed the patients History and Physical, chart, labs and discussed the  procedure including the risks, benefits and alternatives for the proposed anesthesia with the patient or authorized representative who has indicated his/her understanding and acceptance.       Plan Discussed with: CRNA  Anesthesia Plan Comments: (Plan for spinal and GA with natural airway, LMA/GETA backup.  Patient consented for risks of anesthesia including but not limited to:  - adverse reactions to medications - damage to eyes, teeth, lips or other oral mucosa - nerve damage due to positioning  - sore throat or hoarseness - headache, bleeding, infection, nerve damage 2/2 spinal - damage to heart, brain, nerves, lungs, other parts of body or loss of life  Informed patient about role of CRNA in peri- and intra-operative care.  Patient voiced understanding.)        Anesthesia Quick Evaluation

## 2023-10-23 NOTE — Op Note (Signed)
10/23/2023  2:48 PM  Patient:   Emma Rivera  Pre-Op Diagnosis:   Degenerative joint disease, left knee.  Post-Op Diagnosis:   Same  Procedure:   Left TKA using all-cemented Zimmer Persona system with a #5 PCR femur, a(n) C-sized  tibial tray with a 13 mm medial congruent E-poly insert, and a 32 x 8.5 mm all-poly 3-pegged domed patella.  Surgeon:   Maryagnes Amos, MD  Assistant:   Horris Latino, PA-C   Anesthesia:   Spinal  Findings:   As above  Complications:   None  EBL:   5 cc  Fluids:   500 cc crystalloid  UOP:   None  TT:   90 minutes at 300 mmHg  Drains:   None  Closure:   Staples  Implants:   As above  Brief Clinical Note:   The patient is an 82 year old female with a history of progressively worsening left knee pain. The patient's symptoms have progressed despite medications, activity modification, injections, etc. The patient's history and examination were consistent with degenerative joint disease of the left knee confirmed by plain radiographs and MRI scan. The patient presents at this time for a left total knee arthroplasty.  Procedure:   The patient was brought into the operating room. After adequate spinal anesthesia was obtained, the patient was repositioned in the supine position on the operating room table. The left lower extremity was prepped with ChloraPrep solution and draped sterilely. Preoperative antibiotics were administered. A timeout was performed to verify the appropriate surgical site before the limb was exsanguinated with an Esmarch and the tourniquet inflated to 300 mmHg.   A standard anterior approach to the knee was made through an approximately 6-7 inch incision. The incision was carried down through the subcutaneous tissues to expose superficial retinaculum. This was split the length of the incision and the medial flap elevated sufficiently to expose the medial retinaculum. The medial retinaculum was incised, leaving a 3-4 mm cuff of tissue on  the patella. This was extended distally along the medial border of the patellar tendon and proximally through the medial third of the quadriceps tendon. A subtotal fat pad excision was performed before the soft tissues were elevated off the anteromedial and anterolateral aspects of the proximal tibia to the level of the collateral ligaments. The anterior portions of the medial and lateral menisci were removed, as was the anterior cruciate ligament. With the knee flexed to 90, the external tibial guide was positioned and the appropriate proximal tibial cut made. This piece was taken to the back table where it was measured and found to be optimally replicated by a(n) C-sized component.  Attention was directed to the distal femur. The intramedullary canal was accessed through a 3/8" drill hole. The intramedullary guide was inserted and placed at 5 of valgus alignment. Using the +0 slot, the distal cut was made. The distal femur was measured and found to be optimally replicated by the #5 component. The #5 4-in-1 cutting block was positioned and first the posterior, then the posterior chamfer, the anterior, and finally the anterior chamfer cuts were made after verifying that the anterior cortex would not be notched.   At this point, the posterior portions medial and lateral menisci were removed. A trial reduction was performed using the appropriate femoral and tibial components with first the 10 mm, then the 12 mm, and finally the 13 mm  insert. The 13 mm insert demonstrated excellent stability to varus and valgus stressing both in flexion  and extension while permitting full extension. Patellar tracking was assessed and found to be excellent. Therefore, the tibial trial position was marked on the proximal tibia. The patella thickness was measured and found to be 22 mm. Therefore, the appropriate cut was made. The patellar surface was measured and found to be optimally replicated by the 32 mm component. The three  peg holes were drilled in place before the trial button was inserted. Patella tracking was assessed and found to be excellent, passing the "no thumb test". The lug holes were drilled into the distal femur before the trial component was removed.  The tibial tray was repositioned before the keel was created using the appropriate tower, reamer, and punch.  The bony surfaces were prepared for cementing by irrigating them thoroughly with sterile saline solution via the jet lavage system. A bone plug was fashioned from some of the bone that had been removed previously and used to plug the distal femoral canal. In addition, a "cocktail" of 20 cc of Exparel, 30 cc of 0.5% Sensorcaine, 2 cc of Kenalog 40 (80 mg), and 30 mg of Toradol diluted out to 90 cc with normal saline was injected into the postero-medial and postero-lateral aspects of the knee, the medial and lateral gutter regions, and the peri-incisional tissues to help with postoperative analgesia. Meanwhile, the cement was being mixed on the back table.   When the cement was ready, the tibial tray was cemented in first. The excess cement was removed using Personal assistant. Next, the femoral component was impacted into place. Again, the excess cement was removed using Personal assistant. The  13 mm  trial insert was positioned and the knee brought into extension while the cement hardened. Finally, the patella was cemented into place and secured using the patellar clamp. Again, the excess cement was removed using Personal assistant. Once the cement had hardened, the knee was placed through a range of motion with the findings as described above. Therefore, the trial insert was removed and, after verifying that no cement had been retained posteriorly, the permanent 13 mm medial congruent E-polyethylene insert was snapped into place with care taken to ensure appropriate locking of the insert. Again the knee was placed through a range of motion with the findings as described  above.  The wound was copiously irrigated with sterile saline solution using the jet lavage system before the quadriceps tendon and retinacular layer were reapproximated using #0 Vicryl interrupted sutures. The superficial retinacular layer also was closed using a running #0 Vicryl suture. The subcutaneous tissues were closed in several layers using 2-0 Vicryl interrupted sutures. The skin was closed using staples. A sterile honeycomb dressing was applied to the skin before the leg was wrapped with an Ace wrap to accommodate the Polar Care device. The patient was then awakened and returned to the recovery room in satisfactory condition after tolerating the procedure well.

## 2023-10-24 ENCOUNTER — Encounter: Payer: Self-pay | Admitting: Surgery

## 2023-10-24 DIAGNOSIS — M1712 Unilateral primary osteoarthritis, left knee: Secondary | ICD-10-CM | POA: Diagnosis not present

## 2023-10-24 MED ORDER — ORAL CARE MOUTH RINSE: 15.0000 mL | OROMUCOSAL | Status: DC | PRN

## 2023-10-24 MED ORDER — ONDANSETRON HCL 4 MG PO TABS: 4.0000 mg | ORAL_TABLET | Freq: Four times a day (QID) | ORAL | 0 refills | Status: DC | PRN

## 2023-10-24 MED ORDER — OXYCODONE HCL 5 MG PO TABS
ORAL_TABLET | ORAL | Status: AC
  Filled 2023-10-24: qty 1

## 2023-10-24 MED ORDER — ACETAMINOPHEN 500 MG PO TABS
ORAL_TABLET | ORAL | Status: AC
  Filled 2023-10-24: qty 2

## 2023-10-24 MED ORDER — LOSARTAN POTASSIUM 50 MG PO TABS
ORAL_TABLET | ORAL | Status: AC
  Filled 2023-10-24: qty 1

## 2023-10-24 MED ORDER — AMLODIPINE BESYLATE 5 MG PO TABS
ORAL_TABLET | ORAL | Status: AC
  Filled 2023-10-24: qty 2

## 2023-10-24 MED ORDER — OXYCODONE HCL 5 MG PO TABS: 5.0000 mg | ORAL_TABLET | ORAL | 0 refills | Status: DC | PRN

## 2023-10-24 MED ORDER — ADULT MULTIVITAMIN W/MINERALS CH
ORAL_TABLET | ORAL | Status: AC
  Filled 2023-10-24: qty 1

## 2023-10-24 MED ORDER — CEFAZOLIN SODIUM-DEXTROSE 2-4 GM/100ML-% IV SOLN
INTRAVENOUS | Status: AC
  Filled 2023-10-24: qty 100

## 2023-10-24 MED ORDER — DOCUSATE SODIUM 100 MG PO CAPS
ORAL_CAPSULE | ORAL | Status: AC
Start: 2023-10-24 — End: ?
  Filled 2023-10-24: qty 1

## 2023-10-24 MED ORDER — HYDROCHLOROTHIAZIDE 25 MG PO TABS
ORAL_TABLET | ORAL | Status: AC
  Filled 2023-10-24: qty 1

## 2023-10-24 MED ORDER — PANTOPRAZOLE SODIUM 40 MG PO TBEC
DELAYED_RELEASE_TABLET | ORAL | Status: AC
  Filled 2023-10-24: qty 1

## 2023-10-24 NOTE — Plan of Care (Signed)
  Problem: Activity: Goal: Risk for activity intolerance will decrease Outcome: Progressing   Problem: Nutrition: Goal: Adequate nutrition will be maintained Outcome: Progressing   Problem: Elimination: Goal: Will not experience complications related to urinary retention Outcome: Progressing   Problem: Pain Management: Goal: General experience of comfort will improve Outcome: Progressing   Problem: Safety: Goal: Ability to remain free from injury will improve Outcome: Progressing

## 2023-10-24 NOTE — Progress Notes (Signed)
DISCHARGE NOTE:  Pt given discharge instructions, scripts and extra honeycomb dressing. Pt verbalized understanding. TED hose on both legs. Walker and polar care sent with pt. Pt wheeled to car by staff. Husband providing transportation.

## 2023-10-24 NOTE — Plan of Care (Signed)
  Problem: Clinical Measurements: Goal: Respiratory complications will improve Outcome: Progressing   Problem: Activity: Goal: Risk for activity intolerance will decrease Outcome: Progressing   Problem: Coping: Goal: Level of anxiety will decrease Outcome: Progressing   Problem: Elimination: Goal: Will not experience complications related to urinary retention Outcome: Progressing   Problem: Pain Management: Goal: General experience of comfort will improve Outcome: Progressing

## 2023-10-24 NOTE — Evaluation (Signed)
Physical Therapy Evaluation Patient Details Name: Emma Rivera MRN: [id number] DOB: [birth year] Today's Date: 10/24/2023  History of Present Illness  82 y/o s/p L TKA 10/23/23  Clinical Impression  Pt pleasant and motivated with POD1 PT session. She did not have a lot of pain and was able to perform all expected POD1 goals well with appropriate cuing.  Pt showed good effort with HEP, had 0-83 AROM (PROM to 86) and was able to easily do SLRs and all mobility.  She initially had very slow and guarded gait but improved with cuing for appropriate UE use and cadence, pt managed steps with various rail configurations w/o issue.  Pt doing well, expect to be able to d/c POD1.  Will benefit from continued PT per TKA protocols.        If plan is discharge home, recommend the following: A little help with walking and/or transfers;A little help with bathing/dressing/bathroom;Assistance with cooking/housework;Assist for transportation;Help with stairs or ramp for entrance   Can travel by private vehicle        Equipment Recommendations Rolling walker (2 wheels)  Recommendations for Other Services       Functional Status Assessment Patient has had a recent decline in their functional status and demonstrates the ability to make significant improvements in function in a reasonable and predictable amount of time.     Precautions / Restrictions Precautions Precautions: Fall;Knee Precaution Booklet Issued: Yes (comment) (HEP) Restrictions Weight Bearing Restrictions: Yes LLE Weight Bearing: Weight bearing as tolerated      Mobility  Bed Mobility Overal bed mobility: Modified Independent             General bed mobility comments: easily transitions to sitting    Transfers Overall transfer level: Needs assistance Equipment used: Rolling walker (2 wheels) Transfers: Sit to/from Stand Sit to Stand: Contact guard assist           General transfer comment: Minimal cuing for set up and  sequencing    Ambulation/Gait Ambulation/Gait assistance: Supervision Gait Distance (Feet): 110 Feet Assistive device: Rolling walker (2 wheels)         General Gait Details: Pt initially with slow, guarded cadence.  Increased cuing and subsequent confidence led to improved confidence and cadence with appropriate RW usage.  Stairs Stairs: Yes Stairs assistance: Contact guard assist   Number of Stairs: 4 (trailed side strategy with both R and L rails x 4 steps each)    Wheelchair Mobility     Tilt Bed    Modified Rankin (Stroke Patients Only)       Balance Overall balance assessment: Modified Independent                                           Pertinent Vitals/Pain Pain Assessment Pain Assessment: Faces Faces Pain Scale: Hurts little more Pain Location: L knee    Home Living Family/patient expects to be discharged to:: Private residence Living Arrangements: Spouse/significant other Available Help at Discharge: Available 24 hours/day   Home Access: Stairs to enter Entrance Stairs-Rails: None Entrance Stairs-Number of Steps: 1 small step Alternate Level Stairs-Number of Steps: 4 Home Layout: Multi-level;Able to live on main level with bedroom/bathroom (recliner, pul-out-couch and 1/2 bath on entry floor) Home Equipment: BSC/3in1      Prior Function Prior Level of Function : Independent/Modified Independent  Extremity/Trunk Assessment   Upper Extremity Assessment Upper Extremity Assessment: Overall WFL for tasks assessed;Generalized weakness    Lower Extremity Assessment Lower Extremity Assessment: Overall WFL for tasks assessed;Generalized weakness (expected post-op weakness)       Communication   Communication Communication: No apparent difficulties  Cognition Arousal: Alert Behavior During Therapy: WFL for tasks assessed/performed Overall Cognitive Status: Within Functional Limits for tasks  assessed                                          General Comments General comments (skin integrity, edema, etc.): Pt needing some extra encouragement but ultimately did very well    Exercises Total Joint Exercises Ankle Circles/Pumps: AROM, 10 reps Quad Sets: Strengthening, 10 reps Short Arc Quad: Strengthening, 10 reps Heel Slides: AROM, 5 reps, Strengthening (with resisted leg ext) Hip ABduction/ADduction: Strengthening, 10 reps Straight Leg Raises: AROM, 10 reps Knee Flexion: PROM, 5 reps Goniometric ROM: 0-83 (PROM to 86)   Assessment/Plan    PT Assessment Patient needs continued PT services  PT Problem List Decreased strength;Decreased range of motion;Decreased activity tolerance;Decreased balance;Decreased safety awareness;Pain;Decreased mobility;Decreased knowledge of use of DME       PT Treatment Interventions DME instruction;Gait training;Stair training;Functional mobility training;Therapeutic activities;Therapeutic exercise;Balance training;Neuromuscular re-education;Patient/family education;Cognitive remediation    PT Goals (Current goals can be found in the Care Plan section)  Acute Rehab PT Goals Patient Stated Goal: Go home PT Goal Formulation: With patient/family Time For Goal Achievement: 11/06/23 Potential to Achieve Goals: Good    Frequency BID     Co-evaluation               AM-PAC PT "6 Clicks" Mobility  Outcome Measure Help needed turning from your back to your side while in a flat bed without using bedrails?: None Help needed moving from lying on your back to sitting on the side of a flat bed without using bedrails?: None Help needed moving to and from a bed to a chair (including a wheelchair)?: None Help needed standing up from a chair using your arms (e.g., wheelchair or bedside chair)?: None Help needed to walk in hospital room?: A Little Help needed climbing 3-5 steps with a railing? : A Little 6 Click Score: 22     End of Session Equipment Utilized During Treatment: Gait belt Activity Tolerance: Patient limited by fatigue Patient left: in chair;with call bell/phone within reach;with family/visitor present Nurse Communication: Mobility status PT Visit Diagnosis: Muscle weakness (generalized) (M62.81);Difficulty in walking, not elsewhere classified (R26.2);Pain Pain - Right/Left: Left Pain - part of body: Knee    Time: 9528-4132 PT Time Calculation (min) (ACUTE ONLY): 40 min   Charges:   PT Evaluation $PT Eval Low Complexity: 1 Low PT Treatments $Gait Training: 8-22 mins $Therapeutic Exercise: 8-22 mins PT General Charges $$ ACUTE PT VISIT: 1 Visit         Malachi Pro, DPT 10/24/2023, 11:02 AM

## 2023-10-24 NOTE — TOC Initial Note (Signed)
Transition of Care [facility]) - Initial/Assessment Note    Patient Details  Name: Emma Rivera MRN: [id number] Date of Birth: [birth year]  [facility]) CM/SW Contact:    Marlowe Sax, RN Phone Number: 10/24/2023, 9:15 AM  Clinical Narrative:                  The patient lives at home with the spouse, RW to be delviered by [employer], [facility] det up for [facility] prior to surgery by surgeons office Expected Discharge Plan: Home w Home Health Services Barriers to Discharge: No Barriers Identified   Patient Goals and CMS Choice            Expected Discharge Plan and Services   Discharge Planning Services: CM Consult   Living arrangements for the past 2 months: Single Family Home Expected Discharge Date: 10/24/23               DME Arranged: Dan Humphreys rolling DME Agency: [employer] Date DME Agency Contacted: 10/24/23 Time DME Agency Contacted: [phone] Representative spoke with at DME Agency: Cletis [place] HH Arranged: PT, OT HH Agency: [facility] Date [facility] Agency Contacted: 10/24/23 Time HH Agency Contacted: [phone] Representative spoke with at [facility] Agency: [place]  Prior Living Arrangements/Services Living arrangements for the past 2 months: Single Family Home Lives with:: Spouse Patient language and need for interpreter reviewed:: Yes Do you feel safe going back to the place where you live?: Yes      Need for Family Participation in Patient Care: Yes (Comment) Care giver support system in place?: Yes (comment)   Criminal Activity/Legal Involvement Pertinent to Current Situation/Hospitalization: No - Comment as needed  Activities of Daily Living   ADL Screening (condition at time of admission) Independently performs ADLs?: Yes (appropriate for developmental age) Is the patient deaf or have difficulty hearing?: Yes (has hearing aids) Does the patient have difficulty seeing, even when wearing glasses/contacts?: No Does the patient have difficulty concentrating, remembering,  or making decisions?: No  Permission Sought/Granted   Permission granted to share information with : Yes, Verbal Permission Granted              Emotional Assessment Appearance:: Appears stated age Attitude/Demeanor/Rapport: Engaged Affect (typically observed): Accepting Orientation: : Oriented to Self, Oriented to Place, Oriented to  Time, Oriented to Situation Alcohol / Substance Use: Not Applicable Psych Involvement: No (comment)  Admission diagnosis:  Status post total knee replacement using cement, left [Z96.652] Patient Active Problem List   Diagnosis Date Noted   Status post total knee replacement using cement, left 10/23/2023   Hypercoagulable state due to persistent atrial fibrillation (HCC) 07/19/2023   History of stroke 01/04/2023   Stable angina (HCC) 03/02/2021   Chest pain of uncertain etiology 02/11/2020   PVC's (premature ventricular contractions) 08/14/2019   Dyspnea on exertion 03/07/2019   Persistent atrial fibrillation (HCC) 12/18/2018   Essential hypertension 12/18/2018   Hyperlipidemia LDL goal <70 12/18/2018   Prediabetes 12/18/2018   Obesity (BMI 30.0-34.9) 12/18/2018   CVA (cerebral vascular accident) (HCC) - L frontal embolic d/t AF s/p IV TPA 12/16/2018   PCP:  [name], MD Pharmacy:   [facility] [address] [place] - [address] [place] [address] Phone: [phone] Fax: [phone]     Social Determinants of Health (SDOH) Social History: SDOH Screenings   Food Insecurity: No Food Insecurity (10/23/2023)  Housing: Patient Declined (10/23/2023)  Transportation Needs: No Transportation Needs (10/23/2023)  Utilities: Not At Risk (  10/23/2023)  Depression (PHQ2-9): Low Risk  (01/01/2019)  Financial Resource Strain: Low Risk  (10/08/2023)   Received from [facility]  Tobacco Use: Low Risk  (10/23/2023)   SDOH Interventions:     Readmission Risk Interventions     No data to  display

## 2023-10-24 NOTE — Progress Notes (Signed)
Subjective: 1 Day Post-Op Procedure(s) (LRB): TOTAL KNEE ARTHROPLASTY (Left) Patient reports pain as mild.   Patient is well, and has had no acute complaints or problems Plan is to go Home after hospital stay. Negative for chest pain and shortness of breath Fever: no Gastrointestinal:Negative for nausea and vomiting  Objective: Vital signs in last 24 hours: Temp:  [97.3 F (36.3 C)-98.1 F (36.7 C)] 97.8 F (36.6 C) (12/11 0413) Pulse Rate:  [60-76] 75 (12/11 0413) Resp:  [9-27] 16 (12/11 0413) BP: (99-148)/(53-78) 146/61 (12/11 0413) SpO2:  [89 %-100 %] 95 % (12/11 0413) Weight:  [81.2 kg] 81.2 kg (12/10 1456)  Intake/Output from previous day:  Intake/Output Summary (Last 24 hours) at 10/24/2023 0756 Last data filed at 10/24/2023 0300 Gross per 24 hour  Intake 1360.11 ml  Output 580 ml  Net 780.11 ml    Intake/Output this shift: No intake/output data recorded.  Labs: No results for input(s): "HGB" in the last 72 hours. No results for input(s): "WBC", "RBC", "HCT", "PLT" in the last 72 hours. No results for input(s): "NA", "K", "CL", "CO2", "BUN", "CREATININE", "GLUCOSE", "CALCIUM" in the last 72 hours. No results for input(s): "LABPT", "INR" in the last 72 hours.   EXAM General - Patient is Alert, Appropriate, and Oriented Extremity - ABD soft Neurovascular intact Dorsiflexion/Plantar flexion intact Incision: dressing C/D/I No cellulitis present Compartment soft Dressing/Incision - clean, dry, no drainage noted to the left knee honeycomb dressing Motor Function - intact, moving foot and toes well on exam.  Abdomen soft with intact bowel sounds this AM.  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   a.) CTA head/neck showed emergent M2 occlusion; CT head negative --> neurology consulted and recommended TPA and transfer to [facility] for possible thrombectomy; b.) repeat CT at [facility] revealed interval resolution of M2  occlusion --> admitted; c.) MRI brain 12/17/2018: acute LEFT MCA infarct with 3 punctate areas of restricted diffusion (largest area involved the LEFT insular cortex)   Atrial fibrillation (HCC) 12/16/2018   a.) new onset 12/16/2018; b.) CHA2DS2-VASc = 6 (age x2, sex, HTN, CVA x2) as of 10/19/2023; c.) s/p DCCV (120 J x 1) with ERAF 04/01/2019; d.) s/p ablation 06/13/2019; e.) s/p DCCV (150 J x1) 03/12/2023; f.) s/p repeat ablation 06/21/2023; g.) cardiac rate/rhythm currently maintained instrinsically without pharmacological intervention; chronically anticoagulated using standard dose apixaban   Baker's cyst of knee, left    Cerebral microvascular disease    DDD (degenerative disc disease), cervical    DDD (degenerative disc disease), lumbar    Depression    Diastolic dysfunction 03/23/2023   a.) TTE 03/23/2023: EF 60-65%, no RWMAs, G2DD, RVSF/RVSP normal, mod LAE, mild RAE, AoV sclerosis with no stenosis   Diverticulosis    Esophageal spasm    Essential hypertension    GERD (gastroesophageal reflux disease)    Heart murmur    History of bilateral cataract extraction 2016   History of kidney stones    History of stroke involving cerebellum    a.) date of neurovascular event unknown; infarct of the posterior inferior LEFT cerebellum noted on MRI brain done on 12/17/2018 performed in setting of acute LEFT MCA infarct   HOH (hard of hearing)    Hyperlipidemia    Moderate persistent asthma    NSVT (nonsustained ventricular tachycardia) (HCC) 01/21/2019   a.) Zio 01/21/2019: 2 runs NSVT lasting up to 18 beats at max rate of 174 bpm;  up to 4.6 second  post-termination pauses   On apixaban therapy    Osteoarthritis    Prediabetes    PSVT (paroxysmal supraventricular tachycardia) (HCC) 10/09/2023   a.) Zio 10/09/2023: 37 runs each lasting < 17 seconds (max rate 210 bpm)    Assessment/Plan: 1 Day Post-Op Procedure(s) (LRB): TOTAL KNEE ARTHROPLASTY (Left) Principal Problem:   Status post  total knee replacement using cement, left  Estimated body mass index is 32.74 kg/m as calculated from the following:   Height as of this encounter: 5\' 2"  (1.575 m).   Weight as of this encounter: 81.2 kg. Advance diet Up with therapy D/C IV fluids when tolerating po intake.  Vitals reviewed this AM. Up with PT today. Continue to work on a BM. Plan for discharge home with HHPT pending progress with PT.  DVT Prophylaxis - TED hose and Eliquis Weight-Bearing as tolerated to left leg  J. Horris Latino, PA-C [facility] 10/24/2023, 7:56 AM

## 2023-10-30 ENCOUNTER — Emergency Department: Payer: Medicare PPO

## 2023-10-30 ENCOUNTER — Inpatient Hospital Stay
Admission: EM | Admit: 2023-10-30 | Discharge: 2023-11-01 | DRG: 309 | Disposition: A | Discharge status: Home Health | Payer: Medicare PPO | Attending: Internal Medicine | Admitting: Internal Medicine

## 2023-10-30 ENCOUNTER — Other Ambulatory Visit: Payer: Self-pay

## 2023-10-30 DIAGNOSIS — J4540 Moderate persistent asthma, uncomplicated: Secondary | ICD-10-CM | POA: Diagnosis present

## 2023-10-30 DIAGNOSIS — I4819 Other persistent atrial fibrillation: Secondary | ICD-10-CM | POA: Diagnosis not present

## 2023-10-30 DIAGNOSIS — I493 Ventricular premature depolarization: Secondary | ICD-10-CM | POA: Diagnosis present

## 2023-10-30 DIAGNOSIS — Z888 Allergy status to other drugs, medicaments and biological substances status: Secondary | ICD-10-CM

## 2023-10-30 DIAGNOSIS — E785 Hyperlipidemia, unspecified: Secondary | ICD-10-CM | POA: Diagnosis present

## 2023-10-30 DIAGNOSIS — K5790 Diverticulosis of intestine, part unspecified, without perforation or abscess without bleeding: Secondary | ICD-10-CM | POA: Diagnosis present

## 2023-10-30 DIAGNOSIS — I10 Essential (primary) hypertension: Secondary | ICD-10-CM | POA: Diagnosis not present

## 2023-10-30 DIAGNOSIS — N3941 Urge incontinence: Secondary | ICD-10-CM | POA: Diagnosis present

## 2023-10-30 DIAGNOSIS — K219 Gastro-esophageal reflux disease without esophagitis: Secondary | ICD-10-CM | POA: Insufficient documentation

## 2023-10-30 DIAGNOSIS — J810 Acute pulmonary edema: Secondary | ICD-10-CM

## 2023-10-30 DIAGNOSIS — Z6832 Body mass index (BMI) 32.0-32.9, adult: Secondary | ICD-10-CM

## 2023-10-30 DIAGNOSIS — Z87442 Personal history of urinary calculi: Secondary | ICD-10-CM

## 2023-10-30 DIAGNOSIS — Z7901 Long term (current) use of anticoagulants: Secondary | ICD-10-CM

## 2023-10-30 DIAGNOSIS — Z881 Allergy status to other antibiotic agents status: Secondary | ICD-10-CM

## 2023-10-30 DIAGNOSIS — E669 Obesity, unspecified: Secondary | ICD-10-CM | POA: Diagnosis present

## 2023-10-30 DIAGNOSIS — Z96652 Presence of left artificial knee joint: Secondary | ICD-10-CM

## 2023-10-30 DIAGNOSIS — F3340 Major depressive disorder, recurrent, in remission, unspecified: Secondary | ICD-10-CM | POA: Diagnosis present

## 2023-10-30 DIAGNOSIS — Z79899 Other long term (current) drug therapy: Secondary | ICD-10-CM

## 2023-10-30 DIAGNOSIS — J811 Chronic pulmonary edema: Secondary | ICD-10-CM

## 2023-10-30 DIAGNOSIS — I4891 Unspecified atrial fibrillation: Secondary | ICD-10-CM | POA: Diagnosis not present

## 2023-10-30 DIAGNOSIS — Z8249 Family history of ischemic heart disease and other diseases of the circulatory system: Secondary | ICD-10-CM

## 2023-10-30 DIAGNOSIS — M1990 Unspecified osteoarthritis, unspecified site: Secondary | ICD-10-CM | POA: Diagnosis present

## 2023-10-30 DIAGNOSIS — Z803 Family history of malignant neoplasm of breast: Secondary | ICD-10-CM

## 2023-10-30 DIAGNOSIS — Z8673 Personal history of transient ischemic attack (TIA), and cerebral infarction without residual deficits: Secondary | ICD-10-CM

## 2023-10-30 DIAGNOSIS — H9190 Unspecified hearing loss, unspecified ear: Secondary | ICD-10-CM | POA: Diagnosis present

## 2023-10-30 DIAGNOSIS — M25562 Pain in left knee: Secondary | ICD-10-CM | POA: Diagnosis present

## 2023-10-30 LAB — COMPREHENSIVE METABOLIC PANEL
ALT: 38 U/L (ref 0–44)
AST: 42 U/L — ABNORMAL HIGH (ref 15–41)
Albumin: 3.6 g/dL (ref 3.5–5.0)
Alkaline Phosphatase: 50 U/L (ref 38–126)
Anion gap: 13 (ref 5–15)
BUN: 28 mg/dL — ABNORMAL HIGH (ref 8–23)
CO2: 21 mmol/L — ABNORMAL LOW (ref 22–32)
Calcium: 9.1 mg/dL (ref 8.9–10.3)
Chloride: 103 mmol/L (ref 98–111)
Creatinine, Ser: 0.88 mg/dL (ref 0.44–1.00)
GFR, Estimated: >60 mL/min (ref >60)
Glucose, Bld: 83 mg/dL (ref 70–99)
Potassium: 4.2 mmol/L (ref 3.5–5.1)
Sodium: 137 mmol/L (ref 135–145)
Total Bilirubin: 1.1 mg/dL (ref <1.2)
Total Protein: 6.7 g/dL (ref 6.5–8.1)

## 2023-10-30 LAB — URINALYSIS, ROUTINE W REFLEX MICROSCOPIC
Bacteria, UA: NONE SEEN
Bilirubin Urine: NEGATIVE
Glucose, UA: NEGATIVE mg/dL
Ketones, ur: NEGATIVE mg/dL
Nitrite: NEGATIVE
Protein, ur: NEGATIVE mg/dL
Specific Gravity, Urine: 1.011 (ref 1.005–1.030)
pH: 8 (ref 5.0–8.0)

## 2023-10-30 LAB — CBC WITH DIFFERENTIAL/PLATELET
Abs Immature Granulocytes: 0.09 10*3/uL — ABNORMAL HIGH (ref 0.00–0.07)
Basophils Absolute: 0 10*3/uL (ref 0.0–0.1)
Basophils Relative: 0 %
Eosinophils Absolute: 0 10*3/uL (ref 0.0–0.5)
Eosinophils Relative: 0 %
HCT: 35.9 % — ABNORMAL LOW (ref 36.0–46.0)
Hemoglobin: 11.8 g/dL — ABNORMAL LOW (ref 12.0–15.0)
Immature Granulocytes: 1 %
Lymphocytes Relative: 14 %
Lymphs Abs: 1.4 10*3/uL (ref 0.7–4.0)
MCH: 32.6 pg (ref 26.0–34.0)
MCHC: 32.9 g/dL (ref 30.0–36.0)
MCV: 99.2 fL (ref 80.0–100.0)
Monocytes Absolute: 1 10*3/uL (ref 0.1–1.0)
Monocytes Relative: 10 %
Neutro Abs: 7.4 10*3/uL (ref 1.7–7.7)
Neutrophils Relative %: 75 %
Platelets: 243 10*3/uL (ref 150–400)
RBC: 3.62 MIL/uL — ABNORMAL LOW (ref 3.87–5.11)
RDW: 13.5 % (ref 11.5–15.5)
WBC: 9.8 10*3/uL (ref 4.0–10.5)
nRBC: 0 % (ref 0.0–0.2)

## 2023-10-30 LAB — MAGNESIUM: Magnesium: 2 mg/dL (ref 1.7–2.4)

## 2023-10-30 LAB — D-DIMER, QUANTITATIVE: D-Dimer, Quant: 1.87 ug{FEU}/mL — ABNORMAL HIGH (ref 0.00–0.50)

## 2023-10-30 MED ORDER — LOSARTAN POTASSIUM 50 MG PO TABS
50.0000 mg | ORAL_TABLET | Freq: Every day | ORAL | Status: DC
  Administered 2023-10-31 – 2023-11-01 (×2): 50 mg via ORAL
  Filled 2023-10-30 (×2): qty 1

## 2023-10-30 MED ORDER — DILTIAZEM HCL-DEXTROSE 125-5 MG/125ML-% IV SOLN (PREMIX)
5.0000 mg/h | INTRAVENOUS | Status: AC
  Administered 2023-10-30: 5 mg/h via INTRAVENOUS
  Filled 2023-10-30: qty 125

## 2023-10-30 MED ORDER — ADULT MULTIVITAMIN W/MINERALS CH
1.0000 | ORAL_TABLET | Freq: Every day | ORAL | Status: DC
  Administered 2023-10-31 – 2023-11-01 (×2): 1 via ORAL
  Filled 2023-10-30 (×2): qty 1

## 2023-10-30 MED ORDER — HYDROCHLOROTHIAZIDE 25 MG PO TABS
25.0000 mg | ORAL_TABLET | Freq: Every day | ORAL | Status: DC
  Administered 2023-10-31 – 2023-11-01 (×2): 25 mg via ORAL
  Filled 2023-10-30 (×2): qty 1

## 2023-10-30 MED ORDER — IOHEXOL 350 MG/ML SOLN
100.0000 mL | Freq: Once | INTRAVENOUS | Status: AC | PRN
  Administered 2023-10-30: 100 mL via INTRAVENOUS

## 2023-10-30 MED ORDER — SODIUM CHLORIDE 0.9 % IV BOLUS
1000.0000 mL | Freq: Once | INTRAVENOUS | Status: AC
  Administered 2023-10-30: 1000 mL via INTRAVENOUS

## 2023-10-30 MED ORDER — CITALOPRAM HYDROBROMIDE 20 MG PO TABS
20.0000 mg | ORAL_TABLET | Freq: Every day | ORAL | Status: DC
  Administered 2023-10-31 – 2023-11-01 (×2): 20 mg via ORAL
  Filled 2023-10-30 (×2): qty 1

## 2023-10-30 MED ORDER — DILTIAZEM HCL 60 MG PO TABS
60.0000 mg | ORAL_TABLET | Freq: Once | ORAL | Status: AC
  Administered 2023-10-30: 60 mg via ORAL
  Filled 2023-10-30: qty 1

## 2023-10-30 MED ORDER — FESOTERODINE FUMARATE ER 4 MG PO TB24
4.0000 mg | ORAL_TABLET | Freq: Every evening | ORAL | Status: DC
  Administered 2023-10-31: 4 mg via ORAL
  Filled 2023-10-30 (×2): qty 1

## 2023-10-30 MED ORDER — ALBUTEROL SULFATE HFA 108 (90 BASE) MCG/ACT IN AERS
1.0000 | INHALATION_SPRAY | Freq: Four times a day (QID) | RESPIRATORY_TRACT | Status: DC | PRN
Start: 2023-10-30 — End: 2023-11-01

## 2023-10-30 MED ORDER — ACETAMINOPHEN 325 MG PO TABS: 650.0000 mg | ORAL_TABLET | ORAL | Status: DC | PRN

## 2023-10-30 MED ORDER — PANTOPRAZOLE SODIUM 40 MG PO TBEC
40.0000 mg | DELAYED_RELEASE_TABLET | Freq: Every day | ORAL | Status: DC
Start: 2023-10-31 — End: 2023-11-01
  Administered 2023-10-31 – 2023-11-01 (×2): 40 mg via ORAL
  Filled 2023-10-30 (×2): qty 1

## 2023-10-30 MED ORDER — BUDESONIDE 0.5 MG/2ML IN SUSP
0.5000 mg | Freq: Two times a day (BID) | RESPIRATORY_TRACT | Status: DC
  Administered 2023-10-31 – 2023-11-01 (×3): 0.5 mg via RESPIRATORY_TRACT
  Filled 2023-10-30 (×3): qty 2

## 2023-10-30 MED ORDER — OXYCODONE HCL 5 MG PO TABS
5.0000 mg | ORAL_TABLET | ORAL | Status: DC | PRN
  Administered 2023-10-30 – 2023-11-01 (×4): 5 mg via ORAL
  Filled 2023-10-30 (×4): qty 1

## 2023-10-30 MED ORDER — FLUTICASONE FUROATE 50 MCG/ACT IN AEPB: 1.0000 | INHALATION_SPRAY | Freq: Every morning | RESPIRATORY_TRACT | Status: DC

## 2023-10-30 MED ORDER — APIXABAN 5 MG PO TABS
5.0000 mg | ORAL_TABLET | Freq: Two times a day (BID) | ORAL | Status: DC
  Administered 2023-10-30 – 2023-11-01 (×4): 5 mg via ORAL
  Filled 2023-10-30 (×4): qty 1

## 2023-10-30 MED ORDER — ATORVASTATIN CALCIUM 20 MG PO TABS
20.0000 mg | ORAL_TABLET | Freq: Every day | ORAL | Status: DC
  Administered 2023-10-31: 20 mg via ORAL
  Filled 2023-10-30: qty 1

## 2023-10-30 MED ORDER — ONDANSETRON HCL 4 MG/2ML IJ SOLN
4.0000 mg | Freq: Four times a day (QID) | INTRAMUSCULAR | Status: DC | PRN
Start: 2023-10-30 — End: 2023-11-01

## 2023-10-30 NOTE — ED Notes (Signed)
Patient assisted to the toilet in room via walker. Patient dizzy and SOB with ambulation. Patient back to bed. VSS. Will continue to monitor.

## 2023-10-30 NOTE — ED Notes (Signed)
Patient ambulated with walker to the toilet. Dizziness present. Patient back in bed without incident. Monitor applied. Will continue to monitor.

## 2023-10-30 NOTE — ED Notes (Signed)
Monitor showing afib rate 155. MD Bradler notified.

## 2023-10-30 NOTE — Assessment & Plan Note (Signed)
-   Hold home amlodipine - Continue home losartan and HCTZ

## 2023-10-30 NOTE — ED Provider Notes (Signed)
Emergency department handoff note  Care of this patient was signed out to me at the end of the previous provider shift.  All pertinent patient information was conveyed and all questions were answered.  Patient pending rate control of her atrial fibrillation with rapid ventricular response.  Patient initially well rate controlled with oral diltiazem however prior to discharge, patient's heart rate jumped back up into the 150s and required a diltiazem drip.  Given the need for persistent IV medications, patient will require admission to the internal medicine service for further evaluation and management.  Dispo: Admit to medicine  CRITICAL CARE Performed by: Merwyn Katos  Total critical care time: 31 minutes  Critical care time was exclusive of separately billable procedures and treating other patients.  Critical care was necessary to treat or prevent imminent or life-threatening deterioration.  Critical care was time spent personally by me on the following activities: development of treatment plan with patient and/or surrogate as well as nursing, discussions with consultants, evaluation of patient's response to treatment, examination of patient, obtaining history from patient or surrogate, ordering and performing treatments and interventions, ordering and review of laboratory studies, ordering and review of radiographic studies, pulse oximetry and re-evaluation of patient's condition.    Merwyn Katos, MD 10/30/23 [phone]

## 2023-10-30 NOTE — ED Provider Triage Note (Signed)
Emergency Medicine Provider Triage Evaluation Note  MAYBREE GROETSCH , a 82 y.o. female  was evaluated in triage.  Pt reports she was trying to get up from the bathroom this morning when her left knee gave out she experienced dizziness and shortness of breath.  Patient had knee replacement surgery on 10/23/23.   Review of Systems  Positive:  Negative:   Physical Exam  BP 115/65 (BP Location: Right Arm)   Pulse 81   Temp (!) 97.5 F (36.4 C) (Oral)   Resp 19   SpO2 100%  Gen:   Awake, no distress tearful Resp:  Normal effort  MSK:   Moves extremities without difficulty  Other:    Medical Decision Making  Medically screening exam initiated at 12:26 PM.  Appropriate orders placed.  [name] was informed that the remainder of the evaluation will be completed by another provider, this initial triage assessment does not replace that evaluation, and the importance of remaining in the ED until their evaluation is complete.     Romeo Apple, Colum Colt A, PA-C 10/30/23 1231

## 2023-10-30 NOTE — Assessment & Plan Note (Signed)
Continue home pain regimen °

## 2023-10-30 NOTE — ED Notes (Signed)
CT arrival to take patient for scan

## 2023-10-30 NOTE — Assessment & Plan Note (Addendum)
Patient has a previous history of recurrent atrial fibrillation with RVR s/p DCCV and ablation x2, now presenting with recurrence.  Potentially triggered by recent surgery.  Cardiology consulted with potential cardioversion tomorrow if remains in RVR.  - Telemetry monitoring - Cardiology consulted; appreciate their recommendations - Diltiazem infusion - Continue home Eliquis - N.p.o. after midnight

## 2023-10-30 NOTE — Assessment & Plan Note (Signed)
-   Continue home regimen 

## 2023-10-30 NOTE — ED Provider Notes (Signed)
[facility] Provider Note    Event Date/Time   First MD Initiated Contact with Patient 10/30/23 1236     (approximate)   History   Weakness   HPI  Emma Rivera is a 82 y.o. female who presents to the emergency department today because of concerns for weakness.  The patient states that she notices when she got up to go use the restroom.  She became weak and dizzy and was a unable to get off of the toilet or out of the restroom.  She denies any palpitations or chest pain when this was going on.  She states she does have a history of A-fib and has had 2 ablations in the past.  She states that she had been feeling good otherwise recently.  Denies any fevers. Apparently when EMS arrived patient's heart rate was in the 180s. She was given 10 of diltiazem.      Physical Exam   Triage Vital Signs: ED Triage Vitals  Encounter Vitals Group     BP 10/30/23 1220 115/65     Systolic BP Percentile --      Diastolic BP Percentile --      Pulse Rate 10/30/23 1220 81     Resp 10/30/23 1220 19     Temp 10/30/23 1220 (!) 97.5 F (36.4 C)     Temp Source 10/30/23 1220 Oral     SpO2 10/30/23 1220 100 %     Weight 10/30/23 1228 179 lb (81.2 kg)     Height 10/30/23 1228 5' 2.5" (1.588 m)     Head Circumference --      Peak Flow --      Pain Score 10/30/23 1228 0     Pain Loc --      Pain Education --      Exclude from Growth Chart --     Most recent vital signs: Vitals:   10/30/23 1220  BP: 115/65  Pulse: 81  Resp: 19  Temp: (!) 97.5 F (36.4 C)  SpO2: 100%   General: Awake, alert, oriented. CV:  Good peripheral perfusion. Regular rate, irregular rhythm. Resp:  Normal effort. Lungs clear. Abd:  No distention.    ED Results / Procedures / Treatments   Labs (all labs ordered are listed, but only abnormal results are displayed) Labs Reviewed  CBC WITH DIFFERENTIAL/PLATELET  COMPREHENSIVE METABOLIC PANEL  D-DIMER, QUANTITATIVE     EKG  I, Phineas Semen, attending physician, personally viewed and interpreted this EKG  EKG Time: 1219 Rate: 89 Rhythm: atrial fibrillation with PVC Axis: normal Intervals: qtc 423 QRS: narrow, q waves v1 ST changes: no st elevation Impression: abnormal ekg   RADIOLOGY CT pending   PROCEDURES:  Critical Care performed: No    MEDICATIONS ORDERED IN ED: Medications - No data to display   IMPRESSION / MDM / ASSESSMENT AND PLAN / ED COURSE  I reviewed the triage vital signs and the nursing notes.                              Differential diagnosis includes, but is not limited to, anemia, electrolyte abnormality, arrythmia.   Patient's presentation is most consistent with acute presentation with potential threat to life or bodily function.   The patient is on the cardiac monitor to evaluate for evidence of arrhythmia and/or significant heart rate changes.  Patient presented to the emergency department today because of concerns  for weakness.  Apparently patient was in A-fib with RVR for EMS on scene.  They did give diltiazem.  By the time of patient's arrival here in the emergency department her heart rate had improved although she continues to be in A-fib.  Will check blood work to evaluate for possible heart damage, electrolyte abnormality or anemia.  Blood work without anemia or leukocytosis. D-dimer however was elevated. Will check ct angio.  CT angio pending at time of sign out.      FINAL CLINICAL IMPRESSION(S) / ED DIAGNOSES   Final diagnoses:  Atrial fibrillation, unspecified type [facility])       Note:  This document was prepared using Dragon voice recognition software and may include unintentional dictation errors.    Phineas Semen, MD 10/30/23 [phone]

## 2023-10-30 NOTE — ED Notes (Signed)
Huel Cote, MD in with patient for consultation.

## 2023-10-30 NOTE — ED Notes (Signed)
Patient up and to toilet with walker. Back to bed without incident.

## 2023-10-30 NOTE — ED Triage Notes (Signed)
Pt comes via EMS from home with c/o increased weakness, sob and dizziness. Pt was on toilet for about hour and was not able to get off.  Pt also states left knee pain and after recent replacement.  22 in right hand Pt HR was in 180, 10 of cardizem was given and pt down to 140s.  130/79 98% RA

## 2023-10-30 NOTE — ED Notes (Signed)
Patient provided box meal and fluids. Also fluids to family.

## 2023-10-30 NOTE — ED Notes (Signed)
While performing OVS, patient very dizzy and head pain/tingling when standing to where patient needed to sit immediately after blood pressure cycle. MD Bradler notified of the same.

## 2023-10-30 NOTE — Assessment & Plan Note (Addendum)
CTA notable for GGO concerning for pulmonary edema.  Not clinically consistent with fat embolism.  Likely due to atrial fibrillation with RVR.  Most recent echocardiogram in May 2024 demonstrated preserved EF of 60-65% with grade 2 diastolic dysfunction.  Pulmonary examination is reassuring.  - Supplemental oxygen if needed.  No evidence of hypoxia at this time - Hold off on diuretics at this time unless patient develops tachypnea, shortness of breath or hypoxia - BNP pending

## 2023-10-30 NOTE — H&P (Signed)
History and Physical    Patient: Emma Rivera DOB: Jan 09, 1941 DOA: 10/30/2023 DOS: the patient was seen and examined on 10/30/2023 PCP: [name], MD  Patient coming from: Home  Chief Complaint:  Chief Complaint  Patient presents with   Atrial Fibrillation   HPI: Emma Rivera is a 82 y.o. female with medical history significant of atrial fibrillation on Eliquis s/p DCCV (July 2024) and ablation (July 2020, August 2024), CVA, asthma, hypertension, hyperlipidemia, prediabetes, recent femur fracture s/p knee arthroplasty, who presents to the ED due to weakness.  Emma Rivera states Emma Rivera was in her usual state of health this morning when Emma Rivera used the restroom at approximately 9:30 AM.  When Emma Rivera wanted to stand up, Emma Rivera noticed that Emma Rivera was feeling very weak and was unable to stand.  Due to this, EMS was called.  Emma Rivera endorses dizziness with standing.  Emma Rivera also endorses shortness of breath but states this is intermittent only and has been ongoing prior to symptom onset.  Emma Rivera denies any chest pain, orthopnea, or lower extremity swelling.  Emma Rivera notes that her right knee has been healing well but Emma Rivera continues to have pain with movement.  ED course: On arrival to the ED, patient was normotensive at 115/65 with heart rate of 88.  Emma Rivera was saturating at 100% on room air.  Emma Rivera was afebrile at 97.5.  Initial workup notable for hemoglobin of 11.8, bicarb 21, BUN 28, creatinine 0.88 with GFR above 60.  UDS with small leukocytes but no bacteria or WBCs/hpf.  CTA of the chest was obtained with geographic GGO concerning for edema versus fat embolism.  Patient given oral diltiazem with initial improvement in rate, however subsequently rate increased up to 155 and patient was placed on diltiazem infusion.  Cardiology consulted with potential cardioversion tomorrow.  TRH contacted for admission.  Review of Systems: As mentioned in the history of present illness. All other systems reviewed and are  negative.  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   a.) CTA head/neck showed emergent M2 occlusion; CT head negative --> neurology consulted and recommended TPA and transfer to [facility] for possible thrombectomy; b.) repeat CT at [facility] revealed interval resolution of M2 occlusion --> admitted; c.) MRI brain 12/17/2018: acute LEFT MCA infarct with 3 punctate areas of restricted diffusion (largest area involved the LEFT insular cortex)   Atrial fibrillation (HCC) 12/16/2018   a.) new onset 12/16/2018; b.) CHA2DS2-VASc = 6 (age x2, sex, HTN, CVA x2) as of 10/19/2023; c.) s/p DCCV (120 J x 1) with ERAF 04/01/2019; d.) s/p ablation 06/13/2019; e.) s/p DCCV (150 J x1) 03/12/2023; f.) s/p repeat ablation 06/21/2023; g.) cardiac rate/rhythm currently maintained instrinsically without pharmacological intervention; chronically anticoagulated using standard dose apixaban   Baker's cyst of knee, left    Cerebral microvascular disease    DDD (degenerative disc disease), cervical    DDD (degenerative disc disease), lumbar    Depression    Diastolic dysfunction 03/23/2023   a.) TTE 03/23/2023: EF 60-65%, no RWMAs, G2DD, RVSF/RVSP normal, mod LAE, mild RAE, AoV sclerosis with no stenosis   Diverticulosis    Esophageal spasm    Essential hypertension    GERD (gastroesophageal reflux disease)    Heart murmur    History of bilateral cataract extraction 2016   History of kidney stones    History of stroke involving cerebellum    a.) date of neurovascular event unknown; infarct of the posterior inferior  LEFT cerebellum noted on MRI brain done on 12/17/2018 performed in setting of acute LEFT MCA infarct   HOH (hard of hearing)    Hyperlipidemia    Moderate persistent asthma    NSVT (nonsustained ventricular tachycardia) (HCC) 01/21/2019   a.) Zio 01/21/2019: 2 runs NSVT lasting up to 18 beats at max rate of 174 bpm;  up to 4.6 second post-termination pauses   On  apixaban therapy    Osteoarthritis    Prediabetes    PSVT (paroxysmal supraventricular tachycardia) (HCC) 10/09/2023   a.) Zio 10/09/2023: 37 runs each lasting < 17 seconds (max rate 210 bpm)   Past Surgical History:  Procedure Laterality Date   ATRIAL FIBRILLATION ABLATION N/A 06/13/2019   Procedure: ATRIAL FIBRILLATION ABLATION;  Surgeon: Regan Lemming, MD;  Location: [facility];  Service: Cardiovascular;  Laterality: N/A;   ATRIAL FIBRILLATION ABLATION N/A 06/21/2023   Procedure: ATRIAL FIBRILLATION ABLATION;  Surgeon: Regan Lemming, MD;  Location: [facility] CV [facility];  Service: Cardiovascular;  Laterality: N/A;   CARDIAC CATHETERIZATION     CARDIOVERSION N/A 04/01/2019   Procedure: CARDIOVERSION;  Surgeon: Lars Masson, MD;  Location: [facility] ENDOSCOPY;  Service: Cardiovascular;  Laterality: N/A;   CARDIOVERSION N/A 03/12/2023   Procedure: CARDIOVERSION;  Surgeon: Meriam Sprague, MD;  Location: [facility] INVASIVE CV LAB;  Service: Cardiovascular;  Laterality: N/A;   CATARACT EXTRACTION W/PHACO Left 09/09/2015   Procedure: CATARACT EXTRACTION PHACO AND INTRAOCULAR LENS PLACEMENT (IOC);  Surgeon: Lia Hopping, MD;  Location: [facility] ORS;  Service: Ophthalmology;  Laterality: Left;  [place]   1:10.5 AP     8.6 CDE  6.05 casette lot #  [id number]   CATARACT EXTRACTION W/PHACO Right 09/30/2015   Procedure: CATARACT EXTRACTION PHACO AND INTRAOCULAR LENS PLACEMENT (IOC);  Surgeon: Lia Hopping, MD;  Location: [facility] ORS;  Service: Ophthalmology;  Laterality: Right;  [place] 01:00.3 AP%: 11.6 CDE: 7.00 FLUID LOT# [id number]   COLONOSCOPY  01/02/2007   COLONOSCOPY WITH PROPOFOL N/A 04/04/2017   Procedure: COLONOSCOPY WITH PROPOFOL;  Surgeon: Scot Jun, MD;  Location: [facility] ENDOSCOPY;  Service: Endoscopy;  Laterality: N/A;   ESOPHAGOGASTRODUODENOSCOPY  10/09/2007   TONSILLECTOMY     TOTAL KNEE ARTHROPLASTY Left 10/23/2023   Procedure: TOTAL KNEE ARTHROPLASTY;  Surgeon:  Christena Flake, MD;  Location: [facility] ORS;  Service: Orthopedics;  Laterality: Left;   Social History:  reports that Emma Rivera has never smoked. Emma Rivera has never used smokeless tobacco. Emma Rivera reports current alcohol use of about 1.0 standard drink of alcohol per week. Emma Rivera reports that Emma Rivera does not use drugs.  Allergies  Allergen Reactions   Imdur [Isosorbide Nitrate] Other (See Comments)    Hair loss   Sulfa Antibiotics Nausea Only    Family History  Problem Relation Age of Onset   Breast cancer Maternal Aunt    Hypertension Mother    Heart disease Mother    Heart disease Father    Hypertension Father     Prior to Admission medications   Medication Sig Start Date End Date Taking? Authorizing Provider  acetaminophen (TYLENOL) 500 MG tablet Take 1,000 mg by mouth every 6 (six) hours as needed for moderate pain or headache.    [provider]  albuterol (VENTOLIN HFA) 108 (90 Base) MCG/ACT inhaler Inhale 1-2 puffs into the lungs every 6 (six) hours as needed (exercise/wheezing/shortness of breath.). 12/24/19   [provider]  amLODipine (NORVASC) 10 MG tablet Take 1 tablet (10 mg total) by mouth daily. Please  schedule appointment for further refills. 1st attempt. 11/28/22   Camnitz, Andree Coss, MD  apixaban (ELIQUIS) 5 MG TABS tablet Take 1 tablet (5 mg total) by mouth 2 (two) times daily. 12/18/18   Layne Benton, NP  atorvastatin (LIPITOR) 20 MG tablet TAKE 1 TABLET (20 MG TOTAL) BY MOUTH DAILY AT 6 PM. 03/11/19   End, Cristal Deer, MD  Carboxymethylcellul-Glycerin (LUBRICATING EYE DROPS OP) Place 1 drop into both eyes daily as needed (dry eyes).    [provider]  citalopram (CELEXA) 20 MG tablet Take 20 mg by mouth in the morning.    [provider]  Fluticasone Furoate 50 MCG/ACT AEPB Inhale 1 puff into the lungs in the morning. ARNUITY ELLIPTA 11/13/22   [provider]  hydrochlorothiazide (HYDRODIURIL) 25 MG tablet Take 25 mg by mouth in the morning.  12/28/18   [provider]  loratadine (CLARITIN) 10 MG tablet Take 10 mg by mouth daily as needed for allergies.    [provider]  losartan (COZAAR) 50 MG tablet Take 50 mg by mouth daily.    [provider]  Multiple Vitamin (MULTIVITAMIN) tablet Take 1 tablet by mouth in the morning.    [provider]  omeprazole (PRILOSEC) 20 MG capsule Take 20 mg by mouth daily before breakfast.    [provider]  ondansetron (ZOFRAN) 4 MG tablet Take 1 tablet (4 mg total) by mouth every 6 (six) hours as needed for nausea. 10/24/23   Anson Oregon, PA-C  oxyCODONE (OXY IR/ROXICODONE) 5 MG immediate release tablet Take 1-2 tablets (5-10 mg total) by mouth every 4 (four) hours as needed for moderate pain (pain score 4-6). 10/24/23   Anson Oregon, PA-C  tolterodine (DETROL) 2 MG tablet Take 2 mg by mouth 2 (two) times daily. 02/14/23   [provider]    Physical Exam: Vitals:   10/30/23 1738 10/30/23 1740 10/30/23 1800 10/30/23 1900  BP: 114/64 108/81 118/70 117/77  Pulse: (!) 116  95 (!) 155  Resp: 10 16 (!) 22 14  Temp:   98.4 F (36.9 C)   TempSrc:   Oral   SpO2: 100%  100% 100%  Weight:      Height:       Physical Exam Vitals and nursing note reviewed.  Constitutional:      General: Emma Rivera is not in acute distress.    Appearance: Emma Rivera is obese.  HENT:     Head: Normocephalic and atraumatic.     Mouth/Throat:     Mouth: Mucous membranes are moist.     Pharynx: Oropharynx is clear.  Eyes:     Conjunctiva/sclera: Conjunctivae normal.     Pupils: Pupils are equal, round, and reactive to light.  Cardiovascular:     Rate and Rhythm: Tachycardia present. Rhythm irregular.     Heart sounds: No murmur heard. Pulmonary:     Effort: Pulmonary effort is normal. No respiratory distress.     Breath sounds: Normal breath sounds. No wheezing, rhonchi or rales.  Musculoskeletal:     Right lower leg: No edema.     Left lower leg: No  edema.  Skin:    General: Skin is warm and dry.     Coloration: Skin is not mottled.  Neurological:     General: No focal deficit present.     Mental Status: Emma Rivera is alert and oriented to person, place, and time. Mental status is at baseline.  Psychiatric:  Mood and Affect: Mood normal.        Behavior: Behavior normal.    Data Reviewed: CBC with WBC of 9.8, hemoglobin of 11.8, platelets of 243 CMP with sodium of 137, potassium 4.2, bicarb 21, BUN 28, creatinine 0.88, AST 42, ALT 38, GFR above 60 Urinalysis with small hematuria, small leukocytes, negative nitrites, no bacteria, no RBC/hpf, minimal WBC/hpf  Initial EKG is only reviewed.  Atrial fibrillation with frequent PVCs.  Rate of 89.  No ST or T wave changes consistent with acute ischemia.  CT Angio Chest PE W and/or Wo Contrast Result Date: 10/30/2023 CLINICAL DATA:  Dizziness and shortness of breath. Recent left knee arthroplasty on 10/23/2023. EXAM: CT ANGIOGRAPHY CHEST WITH CONTRAST TECHNIQUE: Multidetector CT imaging of the chest was performed using the standard protocol during bolus administration of intravenous contrast. Multiplanar CT image reconstructions and MIPs were obtained to evaluate the vascular anatomy. RADIATION DOSE REDUCTION: This exam was performed according to the departmental dose-optimization program which includes automated exposure control, adjustment of the mA and/or kV according to patient size and/or use of iterative reconstruction technique. CONTRAST:  OMNIPAQUE IOHEXOL 350 MG/ML SOLN COMPARISON:  Coronary CTA on 06/13/2023 and 03/17/2021 FINDINGS: Cardiovascular: The pulmonary arteries are adequately opacified. There is no evidence of pulmonary embolism. Central pulmonary arteries are of normal caliber. The thoracic aorta is normal in caliber. Mild aortic atherosclerosis. The heart size is top normal. No pericardial fluid identified. No significant visualized calcified coronary artery plaque.  Mediastinum/Nodes: No enlarged mediastinal, hilar, or axillary lymph nodes. Thyroid gland, trachea, and esophagus demonstrate no significant findings. Lungs/Pleura: Lungs demonstrate geographic ground-glass opacities throughout both lungs which were not present on the prior cardiac CT studies. There is some associated scattered interlobular septal thickening. No focal airspace consolidation, pleural fluid or pneumothorax. Upper Abdomen: No acute abnormality. Musculoskeletal: No chest wall abnormality. No acute or significant osseous findings. Review of the MIP images confirms the above findings. IMPRESSION: 1. No evidence of pulmonary embolism. 2. Geographic ground-glass opacities throughout both lungs with some associated scattered interlobular septal thickening. These findings were not present on the prior cardiac CT studies and differential diagnosis includes pulmonary edema and fat embolism given recent arthroplasty. 3. Aortic atherosclerosis. Aortic Atherosclerosis (ICD10-I70.0). Electronically Signed   By: Irish Lack M.D.   On: 10/30/2023 17:02   Results are pending, will review when available.  Assessment and Plan:  * Atrial fibrillation with RVR (HCC) Patient has a previous history of recurrent atrial fibrillation with RVR s/p DCCV and ablation x2, now presenting with recurrence.  Potentially triggered by recent surgery.  Cardiology consulted with potential cardioversion tomorrow if remains in RVR.  - Telemetry monitoring - Cardiology consulted; appreciate their recommendations - Diltiazem infusion - Continue home Eliquis - N.p.o. after midnight  Pulmonary edema CTA notable for GGO concerning for pulmonary edema.  Not clinically consistent with fat embolism.  Likely due to atrial fibrillation with RVR.  Most recent echocardiogram in May 2024 demonstrated preserved EF of 60-65% with grade 2 diastolic dysfunction.  Pulmonary examination is reassuring.  - Supplemental oxygen if needed.   No evidence of hypoxia at this time - Hold off on diuretics at this time unless patient develops tachypnea, shortness of breath or hypoxia - BNP pending  Essential hypertension - Hold home amlodipine - Continue home losartan and HCTZ  Status post total knee replacement using cement, left - Continue home pain regimen  Recurrent major depressive disorder, in remission (HCC) - Continue home regimen  Urge  incontinence - Continue home regimen  Advance Care Planning:   Code Status: Full Code Verified by patient with her husband at bedside  Consults: Cardiology  Family Communication: Husband updated at bedside  Severity of Illness: The appropriate patient status for this patient is OBSERVATION. Observation status is judged to be reasonable and necessary in order to provide the required intensity of service to ensure the patient's safety. The patient's presenting symptoms, physical exam findings, and initial radiographic and laboratory data in the context of their medical condition is felt to place them at decreased risk for further clinical deterioration. Furthermore, it is anticipated that the patient will be medically stable for discharge from the hospital within 2 midnights of admission.   Author: Verdene Lennert, MD 10/30/2023 8:01 PM  For on call review [email or web address].

## 2023-10-31 DIAGNOSIS — Z96652 Presence of left artificial knee joint: Secondary | ICD-10-CM | POA: Diagnosis present

## 2023-10-31 DIAGNOSIS — E785 Hyperlipidemia, unspecified: Secondary | ICD-10-CM | POA: Diagnosis present

## 2023-10-31 DIAGNOSIS — I10 Essential (primary) hypertension: Secondary | ICD-10-CM | POA: Diagnosis present

## 2023-10-31 DIAGNOSIS — K5790 Diverticulosis of intestine, part unspecified, without perforation or abscess without bleeding: Secondary | ICD-10-CM | POA: Diagnosis present

## 2023-10-31 DIAGNOSIS — F3340 Major depressive disorder, recurrent, in remission, unspecified: Secondary | ICD-10-CM | POA: Diagnosis present

## 2023-10-31 DIAGNOSIS — N3941 Urge incontinence: Secondary | ICD-10-CM | POA: Diagnosis present

## 2023-10-31 DIAGNOSIS — J4540 Moderate persistent asthma, uncomplicated: Secondary | ICD-10-CM | POA: Diagnosis present

## 2023-10-31 DIAGNOSIS — I4891 Unspecified atrial fibrillation: Secondary | ICD-10-CM | POA: Diagnosis present

## 2023-10-31 DIAGNOSIS — Z888 Allergy status to other drugs, medicaments and biological substances status: Secondary | ICD-10-CM | POA: Diagnosis not present

## 2023-10-31 DIAGNOSIS — Z803 Family history of malignant neoplasm of breast: Secondary | ICD-10-CM | POA: Diagnosis not present

## 2023-10-31 DIAGNOSIS — Z7901 Long term (current) use of anticoagulants: Secondary | ICD-10-CM | POA: Diagnosis not present

## 2023-10-31 DIAGNOSIS — H9190 Unspecified hearing loss, unspecified ear: Secondary | ICD-10-CM | POA: Diagnosis present

## 2023-10-31 DIAGNOSIS — K219 Gastro-esophageal reflux disease without esophagitis: Secondary | ICD-10-CM | POA: Diagnosis present

## 2023-10-31 DIAGNOSIS — Z8249 Family history of ischemic heart disease and other diseases of the circulatory system: Secondary | ICD-10-CM | POA: Diagnosis not present

## 2023-10-31 DIAGNOSIS — E669 Obesity, unspecified: Secondary | ICD-10-CM | POA: Diagnosis present

## 2023-10-31 DIAGNOSIS — Z79899 Other long term (current) drug therapy: Secondary | ICD-10-CM | POA: Diagnosis not present

## 2023-10-31 DIAGNOSIS — Z6832 Body mass index (BMI) 32.0-32.9, adult: Secondary | ICD-10-CM | POA: Diagnosis not present

## 2023-10-31 DIAGNOSIS — M1990 Unspecified osteoarthritis, unspecified site: Secondary | ICD-10-CM | POA: Diagnosis present

## 2023-10-31 DIAGNOSIS — I493 Ventricular premature depolarization: Secondary | ICD-10-CM | POA: Diagnosis present

## 2023-10-31 DIAGNOSIS — Z881 Allergy status to other antibiotic agents status: Secondary | ICD-10-CM | POA: Diagnosis not present

## 2023-10-31 DIAGNOSIS — Z8673 Personal history of transient ischemic attack (TIA), and cerebral infarction without residual deficits: Secondary | ICD-10-CM | POA: Diagnosis not present

## 2023-10-31 DIAGNOSIS — M25562 Pain in left knee: Secondary | ICD-10-CM | POA: Diagnosis present

## 2023-10-31 DIAGNOSIS — I4819 Other persistent atrial fibrillation: Secondary | ICD-10-CM | POA: Diagnosis present

## 2023-10-31 DIAGNOSIS — Z87442 Personal history of urinary calculi: Secondary | ICD-10-CM | POA: Diagnosis not present

## 2023-10-31 LAB — BASIC METABOLIC PANEL
Anion gap: 11 (ref 5–15)
BUN: 27 mg/dL — ABNORMAL HIGH (ref 8–23)
CO2: 21 mmol/L — ABNORMAL LOW (ref 22–32)
Calcium: 8.4 mg/dL — ABNORMAL LOW (ref 8.9–10.3)
Chloride: 105 mmol/L (ref 98–111)
Creatinine, Ser: 0.87 mg/dL (ref 0.44–1.00)
GFR, Estimated: >60 mL/min (ref >60)
Glucose, Bld: 115 mg/dL — ABNORMAL HIGH (ref 70–99)
Potassium: 4.4 mmol/L (ref 3.5–5.1)
Sodium: 137 mmol/L (ref 135–145)

## 2023-10-31 LAB — T4, FREE: Free T4: 1.09 ng/dL (ref 0.61–1.12)

## 2023-10-31 LAB — TSH: TSH: 1.958 u[IU]/mL (ref 0.350–4.500)

## 2023-10-31 LAB — BRAIN NATRIURETIC PEPTIDE: B Natriuretic Peptide: 284.7 pg/mL — ABNORMAL HIGH (ref 0.0–100.0)

## 2023-10-31 MED ORDER — DOCUSATE SODIUM 100 MG PO CAPS
100.0000 mg | ORAL_CAPSULE | Freq: Two times a day (BID) | ORAL | Status: DC | PRN
  Administered 2023-10-31 – 2023-11-01 (×2): 100 mg via ORAL
  Filled 2023-10-31 (×2): qty 1

## 2023-10-31 MED ORDER — AMIODARONE HCL IN DEXTROSE 360-4.14 MG/200ML-% IV SOLN
60.0000 mg/h | INTRAVENOUS | Status: DC
  Administered 2023-10-31: 60 mg/h via INTRAVENOUS
  Filled 2023-10-31: qty 200

## 2023-10-31 MED ORDER — AMIODARONE HCL IN DEXTROSE 360-4.14 MG/200ML-% IV SOLN
30.0000 mg/h | INTRAVENOUS | Status: DC
  Administered 2023-10-31 – 2023-11-01 (×2): 30 mg/h via INTRAVENOUS
  Filled 2023-10-31 (×2): qty 200

## 2023-10-31 MED ORDER — AMIODARONE LOAD VIA INFUSION
150.0000 mg | Freq: Once | INTRAVENOUS | Status: AC
  Administered 2023-10-31: 150 mg via INTRAVENOUS
  Filled 2023-10-31: qty 83.34

## 2023-10-31 NOTE — ED Notes (Signed)
Pt ambulated to restroom w her walker with no incident. Pt returned to bed and hooked back to monitor. NAD, Call light within reach.

## 2023-10-31 NOTE — ED Notes (Signed)
Husband at bedside. Pt is awake, NAD, and engaging in conversation.

## 2023-10-31 NOTE — Progress Notes (Signed)
  PROGRESS NOTE    Emma Rivera  XLK:[id number] DOB: Dec 21, 1940 DOA: 10/30/2023 PCP: Emma Rivera  [id number]/ED15A  LOS: 0 days   Brief hospital course:   Assessment & Plan: Emma Rivera is a 82 y.o. female with medical history significant of atrial fibrillation on Eliquis s/p DCCV (July 2024) and ablation (July 2020, August 2024), CVA, asthma, hypertension, hyperlipidemia, prediabetes, recent femur fracture s/p knee arthroplasty, who presents to the ED due to weakness.     * Atrial fibrillation with RVR (HCC) --HR 88 initially, but rate subsequently went up to 155.  Pt was started on dilt gtt. Patient has a previous history of recurrent atrial fibrillation with RVR s/p DCCV and ablation x2, now presenting with recurrence.   --cardio consulted Plan: --start amio bolus+gtt, per cardio --d/c dilt gtt --cont Eliquis  Pulmonary edema, ruled out CTA notable for GGO.  No respiratory symptoms, no hypoxia.  Essential hypertension - Hold home amlodipine --cont losartan and HCTZ  Status post total knee replacement using cement, left, on 10/23/23 - Continue home pain regimen --cont outpatient ortho f/u  Recurrent major depressive disorder, in remission (HCC) - Continue Celexa  Urge incontinence - cont fesoterodine   DVT prophylaxis: [id number] Code Status: Full code  Family Communication: family updated at bedside today Level of care: Progressive Dispo:   The patient is from: home Anticipated d/c is to: home Anticipated d/c date is: 1-2 days Patient currently is not medically ready to d/c due to: on amio gtt   Subjective and Interval History:  Pt reported no dyspnea, no chest pain.  Feeling much better.   Objective: Vitals:   10/31/23 1000 10/31/23 1300 10/31/23 1550 10/31/23 1816  BP: 113/65 112/71  (!) 118/99  Pulse: 94 77  95  Resp: 12 17  (!) 22  Temp:   98.1 F (36.7 C)   TempSrc:   Oral   SpO2: 100% 100%  100%  Weight:      Height:       No intake or  output data in the 24 hours ending 10/31/23 1846 Filed Weights   10/30/23 1228  Weight: 81.2 kg    Examination:   Constitutional: NAD, AAOx3 HEENT: conjunctivae and lids normal, EOMI CV: No cyanosis.   RESP: normal respiratory effort, on RA Extremities: surgical dressing over left knee clean and dry SKIN: warm, dry Neuro: II - XII grossly intact.   Psych: Normal mood and affect.  Appropriate judgement and reason   Data Reviewed: I have personally reviewed labs and imaging studies  Time spent: 50 minutes  Emma Priestly, [facility] If 7PM-7AM, please contact night-coverage 10/31/2023, 6:46 PM

## 2023-10-31 NOTE — Consult Note (Cosign Needed Addendum)
ELECTROPHYSIOLOGY CONSULT NOTE    Patient ID: Emma Rivera MRN: [id number], DOB/AGE: 1941-11-04 82 y.o.  Admit date: 10/30/2023 Date of Consult: 10/31/2023  Primary Physician: [name], MD Primary Cardiologist: Yvonne Kendall, MD  Electrophysiologist: Dr. Elberta Fortis     Patient Profile: Emma Rivera is a 82 y.o. female with a history of afib, CVA, HTN, HLD, recent femur fracture s/p knee arthroplasty who is being seen today for the evaluation of afib at the request of Dr. Fran Lowes.  HPI:  Emma Rivera is a 82 y.o. female with PMH as above.  She is s/p AF ablation 2020 and 06/2023,  maintaining sinus at her most recent follow-up appointment with EP 09/2023. She was in her usual state of health 12/17 morning when she used the restroom at approximately 9:30 AM.  When she wanted to stand up, she noticed that she was feeling very weak and was unable to stand.  Due to this, EMS was called.   Initial EMS EKG concerning for afib w RVR up to ~140-150. She was given dilt IV and pulse lowered to 80s. Initial EKG in ER shows afib, rate 89. She has recurrence of RVR with activity. Dilt gtt was started and cardiology consulted.   On interview, she feels relatively well while laying down. Endorses knee pain.  She denies chest pain, palpitations, dyspnea, PND, orthopnea, nausea, vomiting, dizziness, syncope, edema, weight gain, or early satiety.        Labs Potassium4.4 (12/18 0455) Magnesium  2.0 (12/17 1221) Creatinine, ser  0.87 (12/18 0455) PLT  243 (12/17 1221) HGB  11.8* (12/17 1221) WBC 9.8 (12/17 1221)  .    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   a.) CTA head/neck showed emergent M2 occlusion; CT head negative --> neurology consulted and recommended TPA and transfer to [facility] for possible thrombectomy; b.) repeat CT at [facility] revealed interval resolution of M2 occlusion --> admitted; c.) MRI brain 12/17/2018: acute LEFT MCA infarct  with 3 punctate areas of restricted diffusion (largest area involved the LEFT insular cortex)   Atrial fibrillation (HCC) 12/16/2018   a.) new onset 12/16/2018; b.) CHA2DS2-VASc = 6 (age x2, sex, HTN, CVA x2) as of 10/19/2023; c.) s/p DCCV (120 J x 1) with ERAF 04/01/2019; d.) s/p ablation 06/13/2019; e.) s/p DCCV (150 J x1) 03/12/2023; f.) s/p repeat ablation 06/21/2023; g.) cardiac rate/rhythm currently maintained instrinsically without pharmacological intervention; chronically anticoagulated using standard dose apixaban   Baker's cyst of knee, left    Cerebral microvascular disease    DDD (degenerative disc disease), cervical    DDD (degenerative disc disease), lumbar    Depression    Diastolic dysfunction 03/23/2023   a.) TTE 03/23/2023: EF 60-65%, no RWMAs, G2DD, RVSF/RVSP normal, mod LAE, mild RAE, AoV sclerosis with no stenosis   Diverticulosis    Esophageal spasm    Essential hypertension    GERD (gastroesophageal reflux disease)    Heart murmur    History of bilateral cataract extraction 2016   History of kidney stones    History of stroke involving cerebellum    a.) date of neurovascular event unknown; infarct of the posterior inferior LEFT cerebellum noted on MRI brain done on 12/17/2018 performed in setting of acute LEFT MCA infarct   HOH (hard of hearing)    Hyperlipidemia    Moderate persistent asthma    NSVT (nonsustained ventricular tachycardia) (HCC) 01/21/2019   a.) Zio 01/21/2019: 2 runs NSVT  lasting up to 18 beats at max rate of 174 bpm;  up to 4.6 second post-termination pauses   On apixaban therapy    Osteoarthritis    Prediabetes    PSVT (paroxysmal supraventricular tachycardia) (HCC) 10/09/2023   a.) Zio 10/09/2023: 37 runs each lasting < 17 seconds (max rate 210 bpm)     Surgical History:  Past Surgical History:  Procedure Laterality Date   ATRIAL FIBRILLATION ABLATION N/A 06/13/2019   Procedure: ATRIAL FIBRILLATION ABLATION;  Surgeon: Regan Lemming,  MD;  Location: [facility] IN[facility]ASIVE CV LAB;  Service: Cardiovascular;  Laterality: N/A;   ATRIAL FIBRILLATION ABLATION N/A 06/21/2023   Procedure: ATRIAL FIBRILLATION ABLATION;  Surgeon: Regan Lemming, MD;  Location: [facility] CV [facility];  Service: Cardiovascular;  Laterality: N/A;   CARDIAC CATHETERIZATION     CARDIOVERSION N/A 04/01/2019   Procedure: CARDIOVERSION;  Surgeon: Lars Masson, MD;  Location: [facility] ENDOSCOPY;  Service: Cardiovascular;  Laterality: N/A;   CARDIOVERSION N/A 03/12/2023   Procedure: CARDIOVERSION;  Surgeon: Meriam Sprague, MD;  Location: [facility] INVAS[facility] CV LAB;  Service: Cardiovascular;  Laterality: N/A;   CATARACT EXTRACTION W/PHACO Left 09/09/2015   Procedure: CATARACT EXTRACTION PHACO AND INTRAOCULAR LENS PLACEMENT (IOC);  Surgeon: Lia Hopping, MD;  Location: [facility] ORS;  Service: Ophthalmology;  Laterality: Left;  [place]   1:10.5 AP     8.6 CDE  6.05 casette lot #  [id number]   CATARACT EXTRACTION W/PHACO Right 09/30/2015   Procedure: CATARACT EXTRACTION PHACO AND INTRAOCULAR LENS PLACEMENT (IOC);  Surgeon: Lia Hopping, MD;  Location: [facility] ORS;  Service: Ophthalmology;  Laterality: Right;  [place] 01:00.3 AP%: 11.6 CDE: 7.00 FLUID LOT# [id number]   COLONOSCOPY  01/02/2007   COLONOSCOPY WITH PROPOFOL N/A 04/04/2017   Procedure: COLONOSCOPY WITH PROPOFOL;  Surgeon: Scot Jun, MD;  Location: [facility] ENDOSCOPY;  Service: Endoscopy;  Laterality: N/A;   ESOPHAGOGASTRODUODENOSCOPY  10/09/2007   TONSILLECTOMY     TOTAL KNEE ARTHROPLASTY Left 10/23/2023   Procedure: TOTAL KNEE ARTHROPLASTY;  Surgeon: Christena Flake, MD;  Location: [facility] ORS;  Service: Orthopedics;  Laterality: Left;     (Not in a hospital admission)   Inpatient Medications:   apixaban  5 mg Oral BID   atorvastatin  20 mg Oral q1800   budesonide (PULMICORT) nebulizer solution  0.5 mg Nebulization BID   citalopram  20 mg Oral Daily   fesoterodine  4 mg Oral QPM   hydrochlorothiazide  25 mg Oral  Daily   losartan  50 mg Oral Daily   multivitamin with minerals  1 tablet Oral Daily   pantoprazole  40 mg Oral QAC breakfast    Allergies:  Allergies  Allergen Reactions   Imdur [Isosorbide Nitrate] Other (See Comments)    Hair loss   Sulfa Antibiotics Nausea Only    Family History  Problem Relation Age of Onset   Breast cancer Maternal Aunt    Hypertension Mother    Heart disease Mother    Heart disease Father    Hypertension Father      Physical Exam: Vitals:   10/31/23 0700 10/31/23 0719 10/31/23 0939 10/31/23 1000  BP: 109/68   113/65  Pulse: 77   94  Resp: 13   12  Temp:   97.8 F (36.6 C)   TempSrc:   Oral   SpO2: 96% 100%  100%  Weight:      Height:        GEN- NAD, A&O x 3, normal affect HEENT: Normocephalic, atraumatic  Lungs- CTAB, Normal effort.  Heart- irregular rate and rhythm, No M/G/R.  GI- Soft, NT, ND.  Extremities- No clubbing, cyanosis, or edema   Radiology/Studies: CT Angio Chest PE W and/or Wo Contrast Result Date: 10/30/2023 CLINICAL DATA:  Dizziness and shortness of breath. Recent left knee arthroplasty on 10/23/2023. EXAM: CT ANGIOGRAPHY CHEST WITH CONTRAST TECHNIQUE: Multidetector CT imaging of the chest was performed using the standard protocol during bolus administration of intravenous contrast. Multiplanar CT image reconstructions and MIPs were obtained to evaluate the vascular anatomy. RADIATION DOSE REDUCTION: This exam was performed according to the departmental dose-optimization program which includes automated exposure control, adjustment of the mA and/or kV according to patient size and/or use of iterative reconstruction technique. CONTRAST:  OMNIPAQUE IOHEXOL 350 MG/ML SOLN COMPARISON:  Coronary CTA on 06/13/2023 and 03/17/2021 FINDINGS: Cardiovascular: The pulmonary arteries are adequately opacified. There is no evidence of pulmonary embolism. Central pulmonary arteries are of normal caliber. The thoracic aorta is normal in  caliber. Mild aortic atherosclerosis. The heart size is top normal. No pericardial fluid identified. No significant visualized calcified coronary artery plaque. Mediastinum/Nodes: No enlarged mediastinal, hilar, or axillary lymph nodes. Thyroid gland, trachea, and esophagus demonstrate no significant findings. Lungs/Pleura: Lungs demonstrate geographic ground-glass opacities throughout both lungs which were not present on the prior cardiac CT studies. There is some associated scattered interlobular septal thickening. No focal airspace consolidation, pleural fluid or pneumothorax. Upper Abdomen: No acute abnormality. Musculoskeletal: No chest wall abnormality. No acute or significant osseous findings. Review of the MIP images confirms the above findings. IMPRESSION: 1. No evidence of pulmonary embolism. 2. Geographic ground-glass opacities throughout both lungs with some associated scattered interlobular septal thickening. These findings were not present on the prior cardiac CT studies and differential diagnosis includes pulmonary edema and fat embolism given recent arthroplasty. 3. Aortic atherosclerosis. Aortic Atherosclerosis (ICD10-I70.0). Electronically Signed   By: Irish Lack M.D.   On: 10/30/2023 17:02   DG Knee Left Port Result Date: 10/23/2023 CLINICAL DATA:  Status post total left knee arthroplasty using cement, left. EXAM: PORTABLE LEFT KNEE - 1-2 VIEW COMPARISON:  MRI left knee 08/10/2023 FINDINGS: Interval total left knee arthroplasty. No perihardware lucency is seen to indicate hardware failure or loosening. Expected postop changes including small joint effusion, intra-articular air, and mild anterior subcutaneous air. A surgical drain is seen anteriorly. Anterior surgical skin staples. No acute fracture or dislocation. IMPRESSION: Interval total left knee arthroplasty without evidence of hardware failure. Electronically Signed   By: Neita Garnet M.D.   On: 10/23/2023 16:45   LONG TERM  MONITOR (3-14 DAYS) Result Date: 10/10/2023 Patch Wear Time:  2 days and 23 hours Patient had a min HR of 51 bpm, max HR of 210 bpm, and avg HR of 64 bpm. Predominant underlying rhythm was Sinus Rhythm. 37 Supraventricular Tachycardia runs occurred, all less than 17 beats 3.3% supraventricular ectopy Less than 1% ventricular ectopy No patient triggered episodes recorded Will Camnitz, MD  TTE, 03/23/2023 1. Left ventricular ejection fraction, by estimation, is 60 to 65%. The left ventricle has normal function. The left ventricle has no regional wall motion abnormalities. Left ventricular diastolic parameters are consistent with Grade II diastolic  dysfunction (pseudonormalization).   2. Right ventricular systolic function is normal. The right ventricular size is normal. There is normal pulmonary artery systolic pressure.   3. Left atrial size was moderately dilated.   4. Right atrial size was mildly dilated.   5. The mitral valve is normal in structure.  No evidence of mitral valve regurgitation. No evidence of mitral stenosis.   6. The aortic valve is normal in structure. Aortic valve regurgitation is not visualized. Aortic valve sclerosis/calcification is present, without any evidence of aortic stenosis.   7. The inferior vena cava is normal in size with greater than 50% respiratory variability, suggesting right atrial pressure of 3 mmHg.     EKG: (personally reviewed)  10/30/2023 at 1219 - AF w frequent PVCs, trigeminal at times   EMS run sheet 12/17  AFib w rates ~150-80    TELEMETRY: Afib w rates 70-90s (personally reviewed)    Assessment/Plan: #) Persis Afib #) hypercoag d/t afib S/p ablation x 2,  most recently 06/2023 Now with recurrence of AFib Discussion of AAD options including tikosyn vs amiodarone. Given multiple medication interaction with tikosyn (celexa and hydrochlorothiazide), favor initiating amiodarone at this time. Patient and daughter are in agreement with  this Start bolus + gtt amiodarone Stop dilt gtt after amiodarone bolus is complete Anticipate transitioning to PO amiodarone tomorrow if rates are well-controlled LFTs stable Update thryoid labs for baseline Continue eliquis 5mg  BID Keep K > 4, Mag > 2       For questions or updates, please contact [facility] Please consult [email or web address] for contact info under Cardiology/STEMI.  Signed, Sherie Don, NP  10/31/2023 11:10 AM

## 2023-11-01 DIAGNOSIS — I4891 Unspecified atrial fibrillation: Secondary | ICD-10-CM | POA: Diagnosis not present

## 2023-11-01 DIAGNOSIS — F3340 Major depressive disorder, recurrent, in remission, unspecified: Secondary | ICD-10-CM | POA: Diagnosis not present

## 2023-11-01 DIAGNOSIS — R9389 Abnormal findings on diagnostic imaging of other specified body structures: Secondary | ICD-10-CM

## 2023-11-01 DIAGNOSIS — Z96652 Presence of left artificial knee joint: Secondary | ICD-10-CM | POA: Diagnosis not present

## 2023-11-01 DIAGNOSIS — N3941 Urge incontinence: Secondary | ICD-10-CM

## 2023-11-01 DIAGNOSIS — I10 Essential (primary) hypertension: Secondary | ICD-10-CM | POA: Diagnosis not present

## 2023-11-01 LAB — BASIC METABOLIC PANEL
Anion gap: 7 (ref 5–15)
BUN: 24 mg/dL — ABNORMAL HIGH (ref 8–23)
CO2: 24 mmol/L (ref 22–32)
Calcium: 8.6 mg/dL — ABNORMAL LOW (ref 8.9–10.3)
Chloride: 104 mmol/L (ref 98–111)
Creatinine, Ser: 1.06 mg/dL — ABNORMAL HIGH (ref 0.44–1.00)
GFR, Estimated: 53 mL/min — ABNORMAL LOW (ref >60)
Glucose, Bld: 123 mg/dL — ABNORMAL HIGH (ref 70–99)
Potassium: 4.7 mmol/L (ref 3.5–5.1)
Sodium: 135 mmol/L (ref 135–145)

## 2023-11-01 LAB — CBC
HCT: 33.1 % — ABNORMAL LOW (ref 36.0–46.0)
Hemoglobin: 11.1 g/dL — ABNORMAL LOW (ref 12.0–15.0)
MCH: 33 pg (ref 26.0–34.0)
MCHC: 33.5 g/dL (ref 30.0–36.0)
MCV: 98.5 fL (ref 80.0–100.0)
Platelets: 245 10*3/uL (ref 150–400)
RBC: 3.36 MIL/uL — ABNORMAL LOW (ref 3.87–5.11)
RDW: 13.9 % (ref 11.5–15.5)
WBC: 8.7 10*3/uL (ref 4.0–10.5)
nRBC: 0 % (ref 0.0–0.2)

## 2023-11-01 LAB — MAGNESIUM: Magnesium: 2.1 mg/dL (ref 1.7–2.4)

## 2023-11-01 MED ORDER — AMIODARONE HCL 200 MG PO TABS: ORAL_TABLET | ORAL | 0 refills | Status: DC

## 2023-11-01 MED ORDER — AMIODARONE HCL 200 MG PO TABS: 400.0000 mg | ORAL_TABLET | Freq: Every day | ORAL | Status: DC

## 2023-11-01 MED ORDER — AMIODARONE HCL 200 MG PO TABS: 200.0000 mg | ORAL_TABLET | Freq: Every day | ORAL | Status: DC

## 2023-11-01 MED ORDER — POLYETHYLENE GLYCOL 3350 17 G PO PACK
17.0000 g | PACK | Freq: Every day | ORAL | Status: DC
  Administered 2023-11-01: 17 g via ORAL
  Filled 2023-11-01: qty 1

## 2023-11-01 MED ORDER — AMIODARONE HCL 200 MG PO TABS
400.0000 mg | ORAL_TABLET | Freq: Two times a day (BID) | ORAL | Status: DC
  Administered 2023-11-01: 400 mg via ORAL
  Filled 2023-11-01: qty 2

## 2023-11-01 NOTE — Discharge Summary (Signed)
Physician Discharge Summary   Patient: Emma Rivera MRN: [id number] DOB: [birth year]  Admit date:     10/30/2023  Discharge date: 11/01/2023  Discharge Physician: Marcelino Duster   PCP: [name], MD   Recommendations at discharge:    PCP follow up in 1 week. Cardiology and EP follow up as scheduled.  Discharge Diagnoses: Principal Problem:   Atrial fibrillation with RVR (HCC) Active Problems:   Pulmonary edema   Essential hypertension   Status post total knee replacement using cement, left   Recurrent major depressive disorder, in remission (HCC)   Urge incontinence   Atrial fibrillation with rapid ventricular response (HCC)  Resolved Problems:   * No resolved hospital problems. [facility] Course: Emma Rivera is a 82 y.o. female with medical history significant of atrial fibrillation on Eliquis s/p DCCV (July 2024) and ablation (July 2020, August 2024), CVA, asthma, hypertension, hyperlipidemia, prediabetes, recent femur fracture s/p knee arthroplasty, who presents to the ED due to weakness.  She endorses dizziness with standing. She also endorses shortness of breath but states this is intermittent only and has been ongoing prior to symptom onset. She denies any chest pain, orthopnea, or lower extremity swelling. She notes that her right knee has been healing well but she continues to have pain with movement.  Her heart rate noted to be 155 started on diltiazem drip in the emergency department and admitted to hospitalist service.  Assessment and Plan: * Atrial fibrillation with RVR (HCC) Patient has a previous history of recurrent atrial fibrillation with RVR s/p DCCV and ablation x2, now presenting with recurrence.  Potentially triggered by recent surgery.  Cardiology consulted who recommended to start IV amiodarone drip. Patient's heart rate improved, IV amiodarone transition to oral amnio 400 mg twice daily for 5 days, then 400 mg once daily for 5 days, then 200 mg  daily.  Patient is advised to follow-up with EP in 2 to 4 weeks. Continue Eliquis twice daily for stroke prevention.  Pulmonary edema CTA notable for GGO concerning for pulmonary edema.  Not clinically consistent with fat embolism.  Likely due to atrial fibrillation with RVR.   No respiratory symptoms or hypoxia noted.  Essential hypertension Home dose amlodipine held. Continue home losartan and HCTZ. Advised to keep a log of blood pressures for PCP to adjust antihypertensive medications.  Status post total knee replacement using cement, left Continue home pain regimen, oxycodone.  No new scripts given. Patient to follow-up with orthopedic surgery for suture removal in 1 week as scheduled. PT evaluated her, advised home HHPT which is arranged by TOC.  Recurrent major depressive disorder, in remission (HCC) Continue Celexa  Urge incontinence Continue fesoterodine.  Obesity with BMI 32.2: Encourage diet, exercise and weight reduction.      Consultants: Cardiology Procedures performed: None Disposition: Home health Diet recommendation:  Discharge Diet Orders (From admission, onward)     Start     Ordered   11/01/23 0000  Diet - low sodium heart healthy        11/01/23 1517           Cardiac diet DISCHARGE MEDICATION: Allergies as of 11/01/2023       Reactions   Imdur [isosorbide Nitrate] Other (See Comments)   Hair loss   Sulfa Antibiotics Nausea Only        Medication List     STOP taking these medications    amLODipine 10 MG tablet Commonly known as: NORVASC  TAKE these medications    acetaminophen 500 MG tablet Commonly known as: TYLENOL Take 1,000 mg by mouth every 6 (six) hours as needed for moderate pain or headache.   albuterol 108 (90 Base) MCG/ACT inhaler Commonly known as: VENTOLIN HFA Inhale 1-2 puffs into the lungs every 6 (six) hours as needed (exercise/wheezing/shortness of breath.).   amiodarone 200 MG tablet Commonly  known as: PACERONE Take 2 tablets (400 mg total) by mouth 2 (two) times daily for 5 days, THEN 2 tablets (400 mg total) daily for 5 days, THEN 1 tablet (200 mg total) daily. Start taking on: November 01, 2023   apixaban 5 MG Tabs tablet Commonly known as: ELIQUIS Take 1 tablet (5 mg total) by mouth 2 (two) times daily.   atorvastatin 20 MG tablet Commonly known as: LIPITOR TAKE 1 TABLET (20 MG TOTAL) BY MOUTH DAILY AT 6 PM.   citalopram 20 MG tablet Commonly known as: CELEXA Take 20 mg by mouth in the morning.   Fluticasone Furoate 50 MCG/ACT Aepb Inhale 1 puff into the lungs in the morning. ARNUITY ELLIPTA   hydrochlorothiazide 25 MG tablet Commonly known as: HYDRODIURIL Take 25 mg by mouth in the morning.   loratadine 10 MG tablet Commonly known as: CLARITIN Take 10 mg by mouth daily as needed for allergies.   losartan 50 MG tablet Commonly known as: COZAAR Take 50 mg by mouth daily.   LUBRICATING EYE DROPS OP Place 1 drop into both eyes daily as needed (dry eyes).   multivitamin tablet Take 1 tablet by mouth in the morning.   omeprazole 20 MG capsule Commonly known as: PRILOSEC Take 20 mg by mouth daily before breakfast.   ondansetron 4 MG tablet Commonly known as: ZOFRAN Take 1 tablet (4 mg total) by mouth every 6 (six) hours as needed for nausea.   oxyCODONE 5 MG immediate release tablet Commonly known as: Oxy IR/ROXICODONE Take by mouth. What changed: Another medication with the same name was removed. Continue taking this medication, and follow the directions you see here.   tolterodine 2 MG tablet Commonly known as: DETROL Take 2 mg by mouth 2 (two) times daily.        Follow-up Information     Regan Lemming, MD.   Specialty: Cardiology Contact information: [address] [place] [address] [phone]                Discharge Exam: Ceasar Mons Weights   10/30/23 1228  Weight: 81.2 kg   General - Elderly/obese  Caucasian female, no apparent distress HEENT - PERRLA, EOMI, atraumatic head, non tender sinuses. Lung - Clear, basal rales, no rhonchi or wheezes. Heart - S1, S2 heard, no murmurs, rubs, trace pedal edema. Abdomen-soft, nontender, nondistended. Neuro - Alert, awake and oriented x 3, non focal exam. Skin - Warm and dry.  Left knee sutures, dressing intact  Condition at discharge: stable  The results of significant diagnostics from this hospitalization (including imaging, microbiology, ancillary and laboratory) are listed below for reference.   Imaging Studies: CT Angio Chest PE W and/or Wo Contrast Result Date: 10/30/2023 CLINICAL DATA:  Dizziness and shortness of breath. Recent left knee arthroplasty on 10/23/2023. EXAM: CT ANGIOGRAPHY CHEST WITH CONTRAST TECHNIQUE: Multidetector CT imaging of the chest was performed using the standard protocol during bolus administration of intravenous contrast. Multiplanar CT image reconstructions and MIPs were obtained to evaluate the vascular anatomy. RADIATION DOSE REDUCTION: This exam was performed according to the departmental dose-optimization  program which includes automated exposure control, adjustment of the mA and/or kV according to patient size and/or use of iterative reconstruction technique. CONTRAST:  OMNIPAQUE IOHEXOL 350 MG/ML SOLN COMPARISON:  Coronary CTA on 06/13/2023 and 03/17/2021 FINDINGS: Cardiovascular: The pulmonary arteries are adequately opacified. There is no evidence of pulmonary embolism. Central pulmonary arteries are of normal caliber. The thoracic aorta is normal in caliber. Mild aortic atherosclerosis. The heart size is top normal. No pericardial fluid identified. No significant visualized calcified coronary artery plaque. Mediastinum/Nodes: No enlarged mediastinal, hilar, or axillary lymph nodes. Thyroid gland, trachea, and esophagus demonstrate no significant findings. Lungs/Pleura: Lungs demonstrate geographic ground-glass  opacities throughout both lungs which were not present on the prior cardiac CT studies. There is some associated scattered interlobular septal thickening. No focal airspace consolidation, pleural fluid or pneumothorax. Upper Abdomen: No acute abnormality. Musculoskeletal: No chest wall abnormality. No acute or significant osseous findings. Review of the MIP images confirms the above findings. IMPRESSION: 1. No evidence of pulmonary embolism. 2. Geographic ground-glass opacities throughout both lungs with some associated scattered interlobular septal thickening. These findings were not present on the prior cardiac CT studies and differential diagnosis includes pulmonary edema and fat embolism given recent arthroplasty. 3. Aortic atherosclerosis. Aortic Atherosclerosis (ICD10-I70.0). Electronically Signed   By: Irish Lack M.D.   On: 10/30/2023 17:02   DG Knee Left Port Result Date: 10/23/2023 CLINICAL DATA:  Status post total left knee arthroplasty using cement, left. EXAM: PORTABLE LEFT KNEE - 1-2 VIEW COMPARISON:  MRI left knee 08/10/2023 FINDINGS: Interval total left knee arthroplasty. No perihardware lucency is seen to indicate hardware failure or loosening. Expected postop changes including small joint effusion, intra-articular air, and mild anterior subcutaneous air. A surgical drain is seen anteriorly. Anterior surgical skin staples. No acute fracture or dislocation. IMPRESSION: Interval total left knee arthroplasty without evidence of hardware failure. Electronically Signed   By: Neita Garnet M.D.   On: 10/23/2023 16:45   LONG TERM MONITOR (3-14 DAYS) Result Date: 10/10/2023 Patch Wear Time:  2 days and 23 hours Patient had a min HR of 51 bpm, max HR of 210 bpm, and avg HR of 64 bpm. Predominant underlying rhythm was Sinus Rhythm. 37 Supraventricular Tachycardia runs occurred, all less than 17 beats 3.3% supraventricular ectopy Less than 1% ventricular ectopy No patient triggered episodes  recorded Will Camnitz, MD   Microbiology: Results for orders placed or performed during the hospital encounter of 10/23/23  Urine Culture Add-On     Status: None   Collection Time: 10/18/23  2:01 PM   Specimen: Urine, Clean Catch  Result Value Ref Range Status   Specimen Description   Final    URINE, CLEAN CATCH Performed at [facility], [address], [place], [place] [address]    Special Requests   Final    NONE Performed at [facility], [address], [place], [place] [address]    Culture   Final    NO GROWTH Performed at [facility], [address], [place], [place] [address]    Report Status 10/20/2023 FINAL  Final    Labs: CBC: Recent Labs  Lab 10/30/23 1221 11/01/23 0412  WBC 9.8 8.7  NEUTROABS 7.4  --   HGB 11.8* 11.1*  HCT 35.9* 33.1*  MCV 99.2 98.5  PLT 243 245   Basic Metabolic Panel: Recent Labs  Lab 10/30/23 1221 10/31/23 0455 11/01/23 0412  NA 137 137 135  K 4.2 4.4 4.7  CL  103 105 104  CO2 21* 21* 24  GLUCOSE 83 115* 123*  BUN 28* 27* 24*  CREATININE 0.88 0.87 1.06*  CALCIUM 9.1 8.4* 8.6*  MG 2.0  --  2.1   Liver Function Tests: Recent Labs  Lab 10/30/23 1221  AST 42*  ALT 38  ALKPHOS 50  BILITOT 1.1  PROT 6.7  ALBUMIN 3.6   CBG: No results for input(s): "GLUCAP" in the last 168 hours.  Discharge time spent: 35 minutes.  Signed: Marcelino Duster, MD [facility] 11/01/2023

## 2023-11-01 NOTE — Progress Notes (Signed)
  Patient Name: DESHONA TESCHNER Date of Encounter: 11/01/2023  Primary Cardiologist: Yvonne Kendall, MD Electrophysiologist: Regan Lemming, MD  Interval Summary   Viona Gilmore. Tx to hospital room overnight. Remains in afib, she overall feels better though. Main complaint is ongoing knee pain.  Vital Signs    Vitals:   10/31/23 2217 11/01/23 0333 11/01/23 0829 11/01/23 1019  BP: 124/76 102/81  113/64  Pulse: 75 80  95  Resp: 20 20  18   Temp: 97.8 F (36.6 C) 97.8 F (36.6 C)    TempSrc:      SpO2: 100% 96% 95% 98%  Weight:      Height:        Intake/Output Summary (Last 24 hours) at 11/01/2023 1247 Last data filed at 10/31/2023 2230 Gross per 24 hour  Intake 237 ml  Output --  Net 237 ml   Filed Weights   10/30/23 1228  Weight: 81.2 kg    Physical Exam    GEN- The patient is well appearing, alert and oriented x 3 today.   Lungs- Clear to ausculation bilaterally, normal work of breathing Cardiac- Irregularly irregular rate and rhythm, no murmurs, rubs or gallops GI- soft, NT, ND, + BS Extremities- no clubbing or cyanosis. No edema  Telemetry    AF 60-90s (personally reviewed)  Hospital Course    RHEANNON SPIERS is a 82 y.o. female with a history of afib, CVA, HTN, HLD, recent femur fracture s/p knee arthroplasty who is being seen today for the evaluation of afib   Assessment & Plan    #) Persis Afib #) hypercoag d/t afib S/p ablation x 2,  most recently 06/2023 Now with recurrence of AFib She has remained in rate-controlled AFib overnight Transition amiodarone to PO - 400mg  BID x 5 days, then 400mg  daily x 5 days, then 200mg  daily Continue eliquis 5mg  BID Keep K > 4, Mag > 2   OK to discharge from EP perspective She has follow-up scheduled with EP APP    For questions or updates, please contact [facility] Please consult [email or web address] for contact info under Cardiology/STEMI.  Signed, Sherie Don, NP  11/01/2023, 12:47 PM

## 2023-11-01 NOTE — TOC Initial Note (Signed)
Transition of Care [facility]) - Initial/Assessment Note    Patient Details  Name: Emma Rivera MRN: [id number] Date of Birth: 1941/04/05  Transition [facility]) CM/SW Contact:    Truddie Hidden, RN Phone Number: 11/01/2023, 11:31 AM  Clinical Narrative:                 Per TOC note 12/11 patient HH was arranged with [facility],  [place] from [facility] confirmed patient is active until 12/23/2023.         Patient Goals and CMS Choice            Expected Discharge Plan and Services                                              Prior Living Arrangements/Services                       Activities of Daily Living   ADL Screening (condition at time of admission) Independently performs ADLs?: Yes (appropriate for developmental age) Is the patient deaf or have difficulty hearing?: No Does the patient have difficulty seeing, even when wearing glasses/contacts?: No Does the patient have difficulty concentrating, remembering, or making decisions?: No  Permission Sought/Granted                  Emotional Assessment              Admission diagnosis:  Atrial fibrillation with rapid ventricular response (HCC) [I48.91] Atrial fibrillation with RVR (HCC) [I48.91] Atrial fibrillation, unspecified type [facility]) [I48.91] Patient Active Problem List   Diagnosis Date Noted   Atrial fibrillation with rapid ventricular response (HCC) 10/31/2023   Atrial fibrillation with RVR (HCC) 10/30/2023   GERD without esophagitis 10/30/2023   Recurrent major depressive disorder, in remission (HCC) 10/30/2023   Urge incontinence 10/30/2023   Pulmonary edema 10/30/2023   Status post total knee replacement using cement, left 10/23/2023   Primary osteoarthritis of left knee 09/10/2023   Hypercoagulable state due to persistent atrial fibrillation (HCC) 07/19/2023   History of stroke 01/04/2023   Aortic atherosclerosis (HCC) 04/04/2021   Stable angina (HCC) 03/02/2021    Chest pain of uncertain etiology 02/11/2020   PVC's (premature ventricular contractions) 08/14/2019   Dyspnea on exertion 03/07/2019   Carotid stenosis, asymptomatic, bilateral 01/03/2019   Persistent atrial fibrillation (HCC) 12/18/2018   Essential hypertension 12/18/2018   Hyperlipidemia LDL goal <70 12/18/2018   Prediabetes 12/18/2018   Obesity (BMI 30.0-34.9) 12/18/2018   CVA (cerebral vascular accident) (HCC) - L frontal embolic d/t AF s/p IV TPA 12/16/2018   PCP:  [name], MD Pharmacy:   [facility] [address], [place] - [address] [place] [address] Phone: [phone] Fax: [phone]     Social Drivers of Health (SDOH) Social History: SDOH Screenings   Food Insecurity: No Food Insecurity (10/31/2023)  Housing: Low Risk  (10/31/2023)  Transportation Needs: No Transportation Needs (10/31/2023)  Utilities: Not At Risk (10/31/2023)  Depression (PHQ2-9): Low Risk  (01/01/2019)  Financial Resource Strain: Low Risk  (10/08/2023)   Received from [facility]  Tobacco Use: Low Risk  (10/30/2023)   SDOH Interventions:     Readmission Risk Interventions     No data to display

## 2023-11-01 NOTE — Progress Notes (Signed)
Physical Therapy Evaluation Patient Details Name: Emma Rivera MRN: [id number] DOB: [birth year] Today's Date: 11/01/2023  History of Present Illness  NEKISHA WHITEN is a 82 y.o. female with medical history significant of atrial fibrillation on Eliquis s/p DCCV (July 2024) and ablation (July 2020, August 2024), CVA, asthma, hypertension, hyperlipidemia, prediabetes. Pt is s/p knee arthroplasty on 12/10, who presents to the ED due to weakness.  Clinical Impression   Patient supine upon arrival, with spouse at bedside. Patient agreeable to PT evaluation. Patient reports she was ambulating with RW Mod I within the home, and receiving home health PT services due to recent knee arthroplasty on 12/10. On evaluation, patient was Mod I with bed mobility, and supervision with functional transfers and ambulation ~ 40 ft. Patient tolerated activities well overall, mild SOB, HR in 100-110's with mobility, but returned to mid 90's with seated rest break. Patient left supine in bed with all needs in reach and husband remains at bedside. No equipment needs identified. Patient will benefit from skilled acute PT services to address functional impairments (see below for additional) and maximize functional mobility. Anticipate the need for follow up PT services upon acute hospital discharge. Will continue to follow acutely          If plan is discharge home, recommend the following: A little help with walking and/or transfers;A little help with bathing/dressing/bathroom;Assistance with cooking/housework;Assist for transportation;Help with stairs or ramp for entrance   Can travel by private vehicle        Equipment Recommendations None recommended by PT  Recommendations for Other Services       Functional Status Assessment Patient has had a recent decline in their functional status and demonstrates the ability to make significant improvements in function in a reasonable and predictable amount of time.      Precautions / Restrictions Precautions Precautions: Fall;Knee Precaution Booklet Issued: No (recent TKA on 12/10. Verbal review of exercises, no handout provided) Restrictions Weight Bearing Restrictions Per Provider Order: Yes LLE Weight Bearing Per Provider Order: Weight bearing as tolerated Other Position/Activity Restrictions: WBAT on LLE; Recent TKA on 12/10      Mobility  Bed Mobility Overal bed mobility: Modified Independent             General bed mobility comments: easily transitions to sitting, increased time required to get LLE off bed.    Transfers Overall transfer level: Needs assistance Equipment used: Rolling walker (2 wheels) Transfers: Sit to/from Stand Sit to Stand: Supervision           General transfer comment: able to complete without physical assistance, supervision for safety    Ambulation/Gait Ambulation/Gait assistance: Supervision Gait Distance (Feet): 40 Feet Assistive device: Rolling walker (2 wheels)   Gait velocity: Decreased     General Gait Details: With use of RW, supervision throughout for safety. Antalgic gait pattern with decreased step length noted. Pt guarded with ambulation but overall able to ambulate with no physical assistance. HR elevated to 100-110 with activity  Stairs            Wheelchair Mobility     Tilt Bed    Modified Rankin (Stroke Patients Only)       Balance Overall balance assessment: Modified Independent                                           Pertinent Vitals/Pain Pain  Assessment Pain Assessment: No/denies pain    Home Living Family/patient expects to be discharged to:: Private residence Living Arrangements: Spouse/significant other Available Help at Discharge: Available 24 hours/day Type of Home: House Home Access: Stairs to enter Entrance Stairs-Rails: None Entrance Stairs-Number of Steps: 1 small step (very short threshold) Alternate Level Stairs-Number of  Steps: 4 Home Layout: Multi-level;Able to live on main level with bedroom/bathroom (recliner, pul-out-couch and 1/2 bath on entry floor) Home Equipment: Rolling Walker (2 wheels);BSC/3in1;Shower seat      Prior Function Prior Level of Function : Independent/Modified Independent             Mobility Comments: Recent use of RW due to TKA on 12/10, overall Mod I with mobility.       Extremity/Trunk Assessment   Upper Extremity Assessment Upper Extremity Assessment: Overall WFL for tasks assessed    Lower Extremity Assessment Lower Extremity Assessment: Generalized weakness (weakness in LLE; recent TKA on 12/10)       Communication   Communication Communication: No apparent difficulties  Cognition Arousal: Alert Behavior During Therapy: WFL for tasks assessed/performed Overall Cognitive Status: Within Functional Limits for tasks assessed                                          General Comments      Exercises     Assessment/Plan    PT Assessment Patient needs continued PT services  PT Problem List Decreased strength;Decreased range of motion;Decreased activity tolerance;Decreased balance;Decreased safety awareness;Decreased mobility;Decreased knowledge of use of DME       PT Treatment Interventions DME instruction;Gait training;Stair training;Functional mobility training;Therapeutic activities;Therapeutic exercise;Balance training;Neuromuscular re-education;Patient/family education;Cognitive remediation    PT Goals (Current goals can be found in the Care Plan section)  Acute Rehab PT Goals Patient Stated Goal: Get Home PT Goal Formulation: With patient/family Time For Goal Achievement: 11/15/23 Potential to Achieve Goals: Good    Frequency Min 1X/week     Co-evaluation               AM-PAC PT "6 Clicks" Mobility  Outcome Measure Help needed turning from your back to your side while in a flat bed without using bedrails?: None Help  needed moving from lying on your back to sitting on the side of a flat bed without using bedrails?: None Help needed moving to and from a bed to a chair (including a wheelchair)?: None Help needed standing up from a chair using your arms (e.g., wheelchair or bedside chair)?: None Help needed to walk in hospital room?: A Little Help needed climbing 3-5 steps with a railing? : A Little 6 Click Score: 22    End of Session Equipment Utilized During Treatment: Gait belt Activity Tolerance: Patient tolerated treatment well Patient left: with family/visitor present;in bed;with call bell/phone within reach Nurse Communication: Mobility status PT Visit Diagnosis: Muscle weakness (generalized) (M62.81);Difficulty in walking, not elsewhere classified (R26.2);Other abnormalities of gait and mobility (R26.89)    Time: 9629-5284 PT Time Calculation (min) (ACUTE ONLY): 29 min   Charges:   PT Evaluation $PT Eval Moderate Complexity: 1 Mod   PT General Charges $$ ACUTE PT VISIT: 1 Visit         Creed Copper Fairly, PT, DPT 11/01/23 1:51 PM

## 2023-11-01 NOTE — TOC Transition Note (Signed)
Transition of Care [facility]) - Discharge Note   Patient Details  Name: Emma Rivera MRN: [id number] Date of Birth: Jun 16, 1941  Transition of Care [facility]) CM/SW Contact:  Truddie Hidden, RN Phone Number: 11/01/2023, 4:20 PM   Clinical Narrative:    Patient being discharged home. [facility] notified.   TOC signing off          Patient Goals and CMS Choice            Discharge Placement                       Discharge Plan and Services Additional resources added to the After Visit Summary for                                       Social Drivers of Health (SDOH) Interventions SDOH Screenings   Food Insecurity: No Food Insecurity (10/31/2023)  Housing: Low Risk  (10/31/2023)  Transportation Needs: No Transportation Needs (10/31/2023)  Utilities: Not At Risk (10/31/2023)  Depression (PHQ2-9): Low Risk  (01/01/2019)  Financial Resource Strain: Low Risk  (10/08/2023)   Received from [facility]  Tobacco Use: Low Risk  (10/30/2023)     Readmission Risk Interventions     No data to display

## 2023-11-06 ENCOUNTER — Telehealth: Payer: Self-pay | Admitting: Cardiology

## 2023-11-06 NOTE — Telephone Encounter (Signed)
Reviewed with Dr. Elberta Fortis.  Pt reports that she starts Amiodarone 2 tablets (400 mg total) daily today.  Advised to take 1 tablet BID and see if that helps w/ symptoms.  Aware if it does not to decrease it to once a day and let us know.  Patient verbalized understanding and agreeable to plan.

## 2023-11-06 NOTE — Telephone Encounter (Signed)
Pt c/o medication issue:  1. Name of Medication: amiodarone (PACERONE) 200 MG tablet   2. How are you currently taking this medication (dosage and times per day)? Take 2 tablets (400 mg total) by mouth 2 (two) times daily for 5 days, THEN 2 tablets (400 mg total) daily for 5 days, THEN 1 tablet (200 mg total) daily.   3. Are you having a reaction (difficulty breathing--STAT)? No  4. What is your medication issue? Pt states she is experiencing fatigue and dizziness.

## 2023-11-17 NOTE — Progress Notes (Signed)
 Electrophysiology Clinic Note    Date:  11/19/2023  Patient ID:  Emma Rivera, Emma Rivera 02/03/1941, MRN [id number] PCP:  Alla Amis, MD  Cardiologist:  Lonni Hanson, MD Electrophysiologist: Soyla Gladis Norton, MD   Discussed the use of AI scribe software for clinical note transcription with the patient, who gave verbal consent to proceed.    Patient Profile    Chief Complaint: hospital follow-up  History of Present Illness: Emma Rivera is a 83 y.o. female with PMH notable for Afib s/p ablation x 2, CVA, HTN, HLD, recent L knee replacement surgery; seen today for Will Gladis Norton, MD for post hospital follow up.   She is s/p AFib ablation 05/2019 and 06/2023 both by Dr. Norton  She was admitted 12/17-19/2024 for Afib w RVR. Amiodarone  was started during that hospitalization. Her ventricular rates were well-controlled at discharge, but she remained in afib.   On follow-up today, she thinks that she converted to sinus rhythm 3 days ago. She wears a fit bit and also has a Radiographer, [employer]rapeutic. Her pulse was previously 80-90, and dropped 3 days ago to 50s, which is her normal sinus range. She continues to take amiodarone  200mg  daily and eliquis  BID, no bleeding concerns.   Her main complaint today is ongoing L knee pain. It is very slowly getting better, continues to work with PT. Needs tylenol  almost daily, only takes narcotics prior to PT sessions. She has a history of sleep apnea but is not currently on treatment. They report snoring, as observed by their spouse. The patient has been hesitant to use a CPAP machine in the past but is open to reconsidering treatment options.   AAD History: Amiodarone  - started 10/2023 Flecainide  - stopped d/t dizziness    ROS:  Please see the history of present illness. All other systems are reviewed and otherwise negative.    Physical Exam    VS:  BP (!) 159/77   Pulse (!) 53   Ht 5' 2 (1.575 m)   Wt 177 lb 3.2 oz (80.4 kg)   SpO2 98%    BMI 32.41 kg/m  BMI: Body mass index is 32.41 kg/m.  Wt Readings from Last 3 Encounters:  11/19/23 177 lb 3.2 oz (80.4 kg)  10/30/23 179 lb (81.2 kg)  10/23/23 179 lb (81.2 kg)     GEN- The patient is well appearing, alert and oriented x 3 today.   Lungs- Clear to ausculation bilaterally, normal work of breathing.  Heart- Regular rate and rhythm, no murmurs, rubs or gallops Extremities- Trace peripheral edema, warm, dry    Studies Reviewed   Previous EP, cardiology notes.    EKG is ordered. Personal review of EKG from today shows:    EKG Interpretation Date/Time:  Monday November 19 2023 10:33:35 EST Ventricular Rate:  53 PR Interval:  144 QRS Duration:  70 QT Interval:  446 QTC Calculation: 418 R Axis:   41  Text Interpretation: Sinus bradycardia Confirmed by Emma Rivera [id number] on 11/19/2023 10:37:24 AM    Long term monitor, 10/09/2023 Patient had a min HR of 51 bpm, max HR of 210 bpm, and avg HR of 64bpm.  Predominant underlying rhythm was Sinus Rhythm.  37 Supraventricular Tachycardia runs occurred, all less than 17 beats  3.3% supraventricular ectopy Less than 1% ventricular ectopy No patient triggered episodes recorded  Long term monitor, 04/02/2023 Predominant rhythm was sinus rhythm Less than 1% ventricular and supraventricular ectopy burden No atrial fibrillation noted Patient triggered episodes  associated with sinus rhythm  TTE, 03/23/2023  1. Left ventricular ejection fraction, by estimation, is 60 to 65%. The left ventricle has normal function. The left ventricle has no regional wall motion abnormalities. Left ventricular diastolic parameters are consistent with Grade II diastolic dysfunction (pseudonormalization).   2. Right ventricular systolic function is normal. The right ventricular size is normal. There is normal pulmonary artery systolic pressure.   3. Left atrial size was moderately dilated.   4. Right atrial size was mildly dilated.   5. The  mitral valve is normal in structure. No evidence of mitral valve regurgitation. No evidence of mitral stenosis.   6. The aortic valve is normal in structure. Aortic valve regurgitation is not visualized. Aortic valve sclerosis/calcification is present, without any evidence of aortic stenosis.   7. The inferior vena cava is normal in size with greater than 50% respiratory variability, suggesting right atrial pressure of 3 mmHg.    Assessment and Plan     #) persis AFib #) high risk medication use - amiodarone  #) sinus bradycardia S/p ablation x 2 by Dr. Inocencio, most recently 06/2023 now with recurrence of afib RVR Recently started amiodarone  and has since converted to sinus brady Asymptomatic of bradycardia with good exercise tolerance Continue 200mg  amiodarone  Will update amio labs at next follow-up appt  #) Hypercoag d/t persis afib CHA2DS2-VASc Score = at least 6 [CHF History: 0, HTN History: 1, Diabetes History: 0, Stroke History: 2, Vascular Disease History: 0, Age Score: 2, Gender Score: 1].  Therefore, the patient's annual risk of stroke is 9.7 %.    Stroke ppx - 5mg  eliquis  BID, appropriately dosed No bleeding concerns  #) OSA Previously diagnosed with OSA but was not interested in using CPAP machine She is not willing to trial OSA treatments Recommended she call pulm - Dr. Aleskerov - to schedule appt to discuss  #) HTN At goal today.  Continue to monitor at home     Current medicines are reviewed at length with the patient today.   The patient does not have concerns regarding her medicines.  The following changes were made today:  none  Labs/ tests ordered today include:  Orders Placed This Encounter  Procedures   EKG 12-Lead     Disposition: patient has routine follow-up schedule with Dr. Inocencio later this month. Will discuss timing of follow-up with MD and notify patient.     Signed, Chantal Needle, NP  11/19/23  12:27 PM  Electrophysiology [facility]

## 2023-11-19 ENCOUNTER — Ambulatory Visit: Payer: Medicare PPO | Attending: Cardiology | Admitting: Cardiology

## 2023-11-19 ENCOUNTER — Encounter: Payer: Self-pay | Admitting: Cardiology

## 2023-11-19 VITALS — BP 159/77 | HR 53 | Ht 62.0 in | Wt 177.2 lb

## 2023-11-19 DIAGNOSIS — I4819 Other persistent atrial fibrillation: Secondary | ICD-10-CM | POA: Diagnosis not present

## 2023-11-19 DIAGNOSIS — D6869 Other thrombophilia: Secondary | ICD-10-CM | POA: Diagnosis not present

## 2023-11-19 DIAGNOSIS — G4733 Obstructive sleep apnea (adult) (pediatric): Secondary | ICD-10-CM

## 2023-11-19 DIAGNOSIS — Z79899 Other long term (current) drug therapy: Secondary | ICD-10-CM

## 2023-11-19 MED ORDER — AMIODARONE HCL 200 MG PO TABS: 200.0000 mg | ORAL_TABLET | Freq: Every day | ORAL | 0 refills | Status: DC

## 2023-11-19 NOTE — Patient Instructions (Addendum)
 Medication Instructions:   Your physician recommends that you continue on your current medications as directed. Please refer to the Current Medication list given to you today.  *If you need a refill on your cardiac medications before your next appointment, please call your pharmacy*   Lab Work:  None Ordered  If you have labs (blood work) drawn today and your tests are completely normal, you will receive your results only by: MyChart Message (if you have MyChart) OR A paper copy in the mail If you have any lab test that is abnormal or we need to change your treatment, we will call you to review the results.   Testing/Procedures:  None Ordered   Follow-Up: At [facility], you and your health needs are our priority.  As part of our continuing mission to provide you with exceptional heart care, we have created designated Provider Care Teams.  These Care Teams include your primary Cardiologist (physician) and Advanced Practice Providers (APPs -  Physician Assistants and Nurse Practitioners) who all work together to provide you with the care you need, when you need it.  We recommend signing up for the patient portal called MyChart.  Sign up information is provided on this After Visit Summary.  MyChart is used to connect with patients for Virtual Visits (Telemedicine).  Patients are able to view lab/test results, encounter notes, upcoming appointments, etc.  Non-urgent messages can be sent to your provider as well.   To learn more about what you can do with MyChart, go to [email or web address].    Your next appointment:   6 month(s)  Provider:   Suzann Riddle, NP    Other Instructions  Please reach out to Dr. DELENA GLENWOOD [facility] Ask about Sleep Apnea Treatments.

## 2023-11-20 NOTE — Telephone Encounter (Signed)
 I called patient to discuss this

## 2023-11-21 ENCOUNTER — Encounter: Payer: Self-pay | Admitting: Surgery

## 2023-11-28 ENCOUNTER — Telehealth: Payer: Self-pay | Admitting: Cardiology

## 2023-11-28 ENCOUNTER — Telehealth: Payer: Self-pay | Admitting: Internal Medicine

## 2023-11-28 MED ORDER — AMLODIPINE BESYLATE 5 MG PO TABS: 5.0000 mg | ORAL_TABLET | Freq: Every day | ORAL | 3 refills | Status: DC

## 2023-11-28 NOTE — Telephone Encounter (Signed)
 Left message to call back

## 2023-11-28 NOTE — Telephone Encounter (Signed)
 It appears that amlodipine  was stopped during her hospitalization for a-fib with RVR last month.  This could be why her BP has been elevated (it was also moderately elevated at recent EP visit).  I recommend starting amlodipine  5 mg daily and following up with an APP in our office in 2-3 weeks to assess her response.  Sammy Crisp, MD [name]

## 2023-11-28 NOTE — Telephone Encounter (Signed)
 See telephone note

## 2023-11-28 NOTE — Telephone Encounter (Signed)
 Called patient and left message for call back.

## 2023-11-28 NOTE — Telephone Encounter (Signed)
 Pt just needs a note from [facility] that she should not travel d/t recent afib issues, needs to be on our letterhead.  Aware I will discuss this w/ MD next week (as he is not in the office this week) and let her know.  She states it is not urgent and agreeable to plan.

## 2023-11-28 NOTE — Telephone Encounter (Signed)
 Pt made aware of MD's recommendations an verbalized understanding. Amlodipine  5 mg sent to requested pharmacy and follow up appointment scheduled for 12/12/23.

## 2023-11-28 NOTE — Telephone Encounter (Signed)
 See previous note

## 2023-11-28 NOTE — Telephone Encounter (Signed)
 Pt returning nurses Sherri's call. Please advise

## 2023-11-28 NOTE — Telephone Encounter (Signed)
 Please see previous encounter

## 2023-11-28 NOTE — Telephone Encounter (Signed)
 Patient stated she was returning RN Michele's call.

## 2023-12-07 ENCOUNTER — Encounter: Payer: Self-pay | Admitting: *Deleted

## 2023-12-07 NOTE — Telephone Encounter (Signed)
Called pt, reviewed preliminary letter w/ pt. Sent via [employer]. Stamped signature to letter and mailed to home address. Pt appreciates the update.

## 2023-12-07 NOTE — Telephone Encounter (Signed)
Called pt.

## 2023-12-11 ENCOUNTER — Other Ambulatory Visit: Payer: Self-pay | Admitting: Surgery

## 2023-12-11 ENCOUNTER — Ambulatory Visit: Payer: Medicare PPO | Admitting: Cardiology

## 2023-12-11 ENCOUNTER — Ambulatory Visit
Admission: RE | Admit: 2023-12-11 | Discharge: 2023-12-11 | Disposition: A | Discharge status: Home / Self Care | Payer: Medicare PPO | Source: Ambulatory Visit | Attending: Surgery | Admitting: Surgery

## 2023-12-11 DIAGNOSIS — M7989 Other specified soft tissue disorders: Secondary | ICD-10-CM | POA: Diagnosis present

## 2023-12-12 ENCOUNTER — Encounter: Payer: Self-pay | Admitting: Cardiology

## 2023-12-12 ENCOUNTER — Ambulatory Visit: Payer: Medicare PPO | Attending: Cardiology | Admitting: Cardiology

## 2023-12-12 VITALS — BP 156/70 | HR 60 | Ht 62.0 in | Wt 177.8 lb

## 2023-12-12 DIAGNOSIS — G4733 Obstructive sleep apnea (adult) (pediatric): Secondary | ICD-10-CM | POA: Diagnosis not present

## 2023-12-12 DIAGNOSIS — I10 Essential (primary) hypertension: Secondary | ICD-10-CM | POA: Diagnosis not present

## 2023-12-12 DIAGNOSIS — I4819 Other persistent atrial fibrillation: Secondary | ICD-10-CM

## 2023-12-12 NOTE — Progress Notes (Signed)
Cardiology Office Note:  .   Date:  12/12/2023  ID:  Emma Rivera, DOB September 04, 1941, MRN [id number] PCP: [name], MD  [facility] Providers Cardiologist:  Yvonne Kendall, MD Electrophysiologist:  Regan Lemming, MD    History of Present Illness: .   Emma Rivera is a 83 y.o. female with a past medical history of stroke (2/20) subsequent diagnosis paroxysmal atrial fibrillation status post ablation (05/2019), COVID-19 infection (11/2020), hypertension, GERD, esophageal spasm, who presents for follow-up on blood pressure today.  Status post A-fib ablation 06/02/2019 and 07/03/2023 for both completed by Dr. Elberta Fortis.  She was admitted 12/17-19/2024 with atrial fibrillation with RVR.  Amiodarone was started during her hospitalization.  Ventricular rates were well-controlled at discharge but she remained in atrial fibrillation.  She was last seen in clinic 11/19/2023 and converted to sinus rhythm approximately 3 days prior.  She wears a Fitbit and also has a cardia mobile.  Her pulse was previously 80-90 bpm and dropped 3 days ago into the 50s.  She continues to take amiodarone 200 mg daily and apixaban 5 mg twice daily.  She continued to be noncompliant with CPAP but was advised to follow-up with pulmonary.  Patient's blood pressure was elevated.  During chart review during hospitalization for A-fib with RVR last month her amlodipine was stopped.  With recent elevation.  She was scheduled for follow-up appointment in clinic after restarting amlodipine 5 mg daily to reassess response.  She returns to clinic today to follow-up on her blood pressure.  She had called into the nurse triage she was concerned starting January 12 Sunday with blood pressure increased to 181/92.  She at that time was subsequently restarted on 5 mg of amlodipine.  She stated during recent hospitalization amlodipine was discontinued and she had previously been on 10 mg daily and her losartan dose was cut in half from  100 mg to 50 mg daily.  She denies any chest pain or shortness of breath but she does have swelling to primarily her left lower extremity which is the knee she had recently had surgery done on.  She just followed up yesterday with her orthopedic provider and had an ultrasound done of that leg that was negative for DVT.  She stated she had been on her amlodipine for several days prior to her unilateral swelling of started so she does not feel this is related to her amlodipine as she had previously been taking amlodipine before.  With her blood pressure being up and down she would like to increase her amlodipine back to 10 mg today as her blood pressure is very well-controlled previously.  She states that she has been compliant with her current medication regimen.  Denies any bleeding with no blood noted in her urine or stool and has not missed any of her apixaban.  She denies any hospitalizations or visits to the emergency department.  ROS: 10 point review of systems has been reviewed and considered negative with exception what is been listed in the HPI  Studies Reviewed: Marland Kitchen   EKG Interpretation Date/Time:  Wednesday December 12 2023 11:19:30 EST Ventricular Rate:  60 PR Interval:  168 QRS Duration:  76 QT Interval:  448 QTC Calculation: 448 R Axis:   31  Text Interpretation: Normal sinus rhythm Normal ECG When compared with ECG of 19-Nov-2023 10:33, No significant change was found Confirmed by Charlsie Quest ([id number]) on 12/12/2023 11:25:59 AM    Long term monitor, 10/09/2023 Patient had a min  HR of 51 bpm, max HR of 210 bpm, and avg HR of 64bpm.  Predominant underlying rhythm was Sinus Rhythm.  37 Supraventricular Tachycardia runs occurred, all less than 17 beats  3.3% supraventricular ectopy Less than 1% ventricular ectopy No patient triggered episodes recorded   Long term monitor, 04/02/2023 Predominant rhythm was sinus rhythm Less than 1% ventricular and supraventricular ectopy burden No  atrial fibrillation noted Patient triggered episodes associated with sinus rhythm   TTE, 03/23/2023  1. Left ventricular ejection fraction, by estimation, is 60 to 65%. The left ventricle has normal function. The left ventricle has no regional wall motion abnormalities. Left ventricular diastolic parameters are consistent with Grade II diastolic dysfunction (pseudonormalization).   2. Right ventricular systolic function is normal. The right ventricular size is normal. There is normal pulmonary artery systolic pressure.   3. Left atrial size was moderately dilated.   4. Right atrial size was mildly dilated.   5. The mitral valve is normal in structure. No evidence of mitral valve regurgitation. No evidence of mitral stenosis.   6. The aortic valve is normal in structure. Aortic valve regurgitation is not visualized. Aortic valve sclerosis/calcification is present, without any evidence of aortic stenosis.   7. The inferior vena cava is normal in size with greater than 50% respiratory variability, suggesting right atrial pressure of 3 mmHg.    Risk Assessment/Calculations:    CHA2DS2-VASc Score = 6   This indicates a 9.7% annual risk of stroke. The patient's score is based upon: CHF History: 0 HTN History: 1 Diabetes History: 0 Stroke History: 2 Vascular Disease History: 0 Age Score: 2 Gender Score: 1    HYPERTENSION CONTROL Vitals:   12/12/23 1115 12/12/23 1125  BP: (!) 158/66 (!) 156/70    The patient's blood pressure is elevated above target today.  In order to address the patient's elevated BP: A current anti-hypertensive medication was adjusted today.          Physical Exam:   VS:  BP (!) 156/70 (BP Location: Left Arm, Patient Position: Sitting, Cuff Size: Normal)   Pulse 60   Ht 5\' 2"  (1.575 m)   Wt 177 lb 12.8 oz (80.6 kg)   SpO2 99%   BMI 32.52 kg/m    Wt Readings from Last 3 Encounters:  12/12/23 177 lb 12.8 oz (80.6 kg)  11/19/23 177 lb 3.2 oz (80.4 kg)   10/30/23 179 lb (81.2 kg)    GEN: Well nourished, well developed in no acute distress NECK: No JVD; No carotid bruits CARDIAC: RRR, no murmurs, rubs, gallops RESPIRATORY:  Clear to auscultation without rales, wheezing or rhonchi  ABDOMEN: Soft, non-tender, non-distended EXTREMITIES:  No edema; No deformity   ASSESSMENT AND PLAN: .   Hypertension with blood pressure today of 115/66 and 156/70.  Recently was restarted on amlodipine 5 mg daily with a small improvement in blood pressure with keeping blood pressure log.  Amlodipine per patient's request is increased to 10 mg daily.  She was advised if her swelling does worsen to notify the office we can always reduce her amlodipine back to 5 mg to go back up to 100 mg on her losartan.  She is encouraged to continue to monitor her blood pressure 1 to 2 hours postmedication administration but not throughout the day.  She is also maintained on hydrochlorothiazide 25 mg daily and losartan 50 mg daily.  Persistent atrial fibrillation status post ablation by 2 by Dr. Elberta Fortis most recently 06/2023 now with recurrence of  atrial fibrillation RVR.  She was recently started on amiodarone and this since converted to sinus as EKG today reveals sinus with a rate of 60 with no acute change from prior studies that she has had in clinic.  She is continued on amiodarone 200 mg daily, apixaban 5 mg twice daily for CHA2DS2-VASc score of at least 6 for stroke prophylaxis.  Obstructive sleep apnea where she is noncompliant with CPAP.  She is not willing to trial OSA treatments and has been recommended to continue to follow-up with pulmonary       Dispo: Patient return to clinic to see MD/APP on previously scheduled appointment in approximately 6 weeks for reevaluation of her blood pressure  Signed, Zenab Gronewold, NP

## 2023-12-12 NOTE — Patient Instructions (Signed)
Medication Instructions:  Increase Amlodipine to 10 mg once a day *If you need a refill on your cardiac medications before your next appointment, please call your pharmacy*  Lab Work: No labs  Testing/Procedures: No testing  Follow-Up: At [facility], you and your health needs are our priority.  As part of our continuing mission to provide you with exceptional heart care, we have created designated Provider Care Teams.  These Care Teams include your primary Cardiologist (physician) and Advanced Practice Providers (APPs -  Physician Assistants and Nurse Practitioners) who all work together to provide you with the care you need, when you need it.  We recommend signing up for the patient portal called "MyChart".  Sign up information is provided on this After Visit Summary.  MyChart is used to connect with patients for Virtual Visits (Telemedicine).  Patients are able to view lab/test results, encounter notes, upcoming appointments, etc.  Non-urgent messages can be sent to your provider as well.   To learn more about what you can do with MyChart, go to [email or web address].    Your next appointment:   March 18th at 12:15 pm with Will Jorja Loa, MD  Other Instructions

## 2024-01-29 ENCOUNTER — Ambulatory Visit: Payer: Medicare PPO | Attending: Cardiology | Admitting: Cardiology

## 2024-01-29 ENCOUNTER — Encounter: Payer: Self-pay | Admitting: Cardiology

## 2024-01-29 VITALS — BP 130/90 | HR 68 | Ht 62.0 in | Wt 177.0 lb

## 2024-01-29 DIAGNOSIS — R0602 Shortness of breath: Secondary | ICD-10-CM | POA: Diagnosis not present

## 2024-01-29 DIAGNOSIS — I10 Essential (primary) hypertension: Secondary | ICD-10-CM | POA: Diagnosis not present

## 2024-01-29 DIAGNOSIS — I4819 Other persistent atrial fibrillation: Secondary | ICD-10-CM | POA: Diagnosis not present

## 2024-01-29 DIAGNOSIS — D6869 Other thrombophilia: Secondary | ICD-10-CM | POA: Diagnosis not present

## 2024-01-29 DIAGNOSIS — G4733 Obstructive sleep apnea (adult) (pediatric): Secondary | ICD-10-CM

## 2024-01-29 MED ORDER — FUROSEMIDE 20 MG PO TABS: ORAL_TABLET | ORAL | 0 refills | Status: DC

## 2024-01-29 NOTE — Patient Instructions (Signed)
 Medication Instructions:  Your physician has recommended you make the following change in your medication:  TAKE Lasix 20 mg daily for 4 days, then stop  *If you need a refill on your cardiac medications before your next appointment, please call your pharmacy*   Lab Work: Today: BNP  If you have any lab test that is abnormal or we need to change your treatment, we will call you to review the results.   Testing/Procedures: None ordered   Follow-Up: At [facility], you and your health needs are our priority.  As part of our continuing mission to provide you with exceptional heart care, we have created designated Provider Care Teams.  These Care Teams include your primary Cardiologist (physician) and Advanced Practice Providers (APPs -  Physician Assistants and Nurse Practitioners) who all work together to provide you with the care you need, when you need it.  Your next appointment:   3 month(s)  The format for your next appointment:   In Person  Provider:   You will see one of the following Advanced Practice Providers on your designated Care Team:   Francis Dowse, Charlott Holler [address] [place], [place] Sherie Don, NP Canary Brim, NP    Thank you for choosing [facility]!!   Dory Horn, RN [phone]

## 2024-01-29 NOTE — Progress Notes (Signed)
  Electrophysiology Office Note:   Date:  01/29/2024  ID:  Emma Rivera, DOB 24-Jul-1941, MRN [id number]  Primary Cardiologist: Yvonne Kendall, MD Primary Heart Failure: None Electrophysiologist: Jeri Jeanbaptiste Jorja Loa, MD      History of Present Illness:   Emma Rivera is a 83 y.o. female with h/o atrial fibrillation post ablation x 2, CVA, hypertension, hyperlipidemia seen today for routine electrophysiology followup.   Since last being seen in our clinic the patient reports no further episodes of atrial fibrillation.  She has now been started on amiodarone.  She has been happy with her control.  Despite this, she has had episodes of shortness of breath.  She gets short of breath with minimal exertion.  She can be short of breath when walking to the bathroom in her house, 15 steps.  She has also noted issues with her hair falling out since starting the amiodarone..  she denies chest pain, palpitations, dyspnea, PND, orthopnea, nausea, vomiting, dizziness, syncope, edema, weight gain, or early satiety.   Review of systems complete and found to be negative unless listed in HPI.   EP Information / Studies Reviewed:    EKG is not ordered today. EKG from 12/12/23 reviewed which showed sinus rhythm        Risk Assessment/Calculations:    CHA2DS2-VASc Score = 6   This indicates a 9.7% annual risk of stroke. The patient's score is based upon: CHF History: 0 HTN History: 1 Diabetes History: 0 Stroke History: 2 Vascular Disease History: 0 Age Score: 2 Gender Score: 1            Physical Exam:   VS:  BP (!) 130/90 (BP Location: Right Arm, Patient Position: Sitting, Cuff Size: Normal)   Pulse 68   Ht 5\' 2"  (1.575 m)   Wt 177 lb (80.3 kg)   SpO2 93%   BMI 32.37 kg/m    Wt Readings from Last 3 Encounters:  01/29/24 177 lb (80.3 kg)  12/12/23 177 lb 12.8 oz (80.6 kg)  11/19/23 177 lb 3.2 oz (80.4 kg)     GEN: Well nourished, well developed in no acute distress NECK: No JVD; No  carotid bruits CARDIAC: Regular rate and rhythm, no murmurs, rubs, gallops RESPIRATORY:  Clear to auscultation without rales, wheezing or rhonchi  ABDOMEN: Soft, non-tender, non-distended EXTREMITIES:  No edema; No deformity   ASSESSMENT AND PLAN:    1.  Persistent atrial fibrillation: Currently on amiodarone 200 mg twice daily.  She has remained in sinus rhythm without further episodes of atrial fibrillation.  She has no acute complaints from her arrhythmia.  Emma Rivera continue with current management.  2.  Secondary hypercoagulable state: Currently on Eliquis 5 mg twice daily for atrial fibrillation  3.  Obstructive sleep apnea: CPAP compliance encouraged  4.  Hypertension: Mildly elevated but usually well-controlled  5.  Shortness of breath: She has mild lower extremity edema.  Emma Rivera plan for Lasix 20 mg daily for the next 4 days.  She Emma Rivera weigh herself and take an extra dose of Lasix if her weight starts to change.  Emma Rivera also plan for BNP today.  Follow up with EP APP in 6 months  Signed, Emma Lacerda Jorja Loa, MD

## 2024-01-30 LAB — PRO B NATRIURETIC PEPTIDE: NT-Pro BNP: 242 pg/mL (ref 0–738)

## 2024-02-21 ENCOUNTER — Other Ambulatory Visit: Payer: Self-pay

## 2024-02-21 ENCOUNTER — Telehealth: Payer: Self-pay | Admitting: Internal Medicine

## 2024-02-21 ENCOUNTER — Emergency Department

## 2024-02-21 ENCOUNTER — Encounter: Payer: Self-pay | Admitting: *Deleted

## 2024-02-21 ENCOUNTER — Other Ambulatory Visit: Payer: Self-pay | Admitting: Cardiology

## 2024-02-21 ENCOUNTER — Emergency Department
Admission: EM | Admit: 2024-02-21 | Discharge: 2024-02-22 | Disposition: A | Discharge status: Home / Self Care | Attending: Emergency Medicine | Admitting: Emergency Medicine

## 2024-02-21 DIAGNOSIS — Z8673 Personal history of transient ischemic attack (TIA), and cerebral infarction without residual deficits: Secondary | ICD-10-CM | POA: Insufficient documentation

## 2024-02-21 DIAGNOSIS — I6782 Cerebral ischemia: Secondary | ICD-10-CM | POA: Diagnosis not present

## 2024-02-21 DIAGNOSIS — R7989 Other specified abnormal findings of blood chemistry: Secondary | ICD-10-CM | POA: Diagnosis not present

## 2024-02-21 DIAGNOSIS — R918 Other nonspecific abnormal finding of lung field: Secondary | ICD-10-CM

## 2024-02-21 DIAGNOSIS — R0789 Other chest pain: Secondary | ICD-10-CM | POA: Diagnosis not present

## 2024-02-21 DIAGNOSIS — D649 Anemia, unspecified: Secondary | ICD-10-CM | POA: Insufficient documentation

## 2024-02-21 DIAGNOSIS — R0602 Shortness of breath: Secondary | ICD-10-CM | POA: Insufficient documentation

## 2024-02-21 LAB — CBC
HCT: 35.7 % — ABNORMAL LOW (ref 36.0–46.0)
Hemoglobin: 11.6 g/dL — ABNORMAL LOW (ref 12.0–15.0)
MCH: 32.1 pg (ref 26.0–34.0)
MCHC: 32.5 g/dL (ref 30.0–36.0)
MCV: 98.9 fL (ref 80.0–100.0)
Platelets: 197 10*3/uL (ref 150–400)
RBC: 3.61 MIL/uL — ABNORMAL LOW (ref 3.87–5.11)
RDW: 14.1 % (ref 11.5–15.5)
WBC: 6.1 10*3/uL (ref 4.0–10.5)
nRBC: 0 % (ref 0.0–0.2)

## 2024-02-21 LAB — BASIC METABOLIC PANEL WITH GFR
Anion gap: 10 (ref 5–15)
BUN: 24 mg/dL — ABNORMAL HIGH (ref 8–23)
CO2: 26 mmol/L (ref 22–32)
Calcium: 9.4 mg/dL (ref 8.9–10.3)
Chloride: 103 mmol/L (ref 98–111)
Creatinine, Ser: 1.07 mg/dL — ABNORMAL HIGH (ref 0.44–1.00)
GFR, Estimated: 52 mL/min — ABNORMAL LOW (ref >60)
Glucose, Bld: 101 mg/dL — ABNORMAL HIGH (ref 70–99)
Potassium: 3.9 mmol/L (ref 3.5–5.1)
Sodium: 139 mmol/L (ref 135–145)

## 2024-02-21 LAB — BRAIN NATRIURETIC PEPTIDE: B Natriuretic Peptide: 199.1 pg/mL — ABNORMAL HIGH (ref 0.0–100.0)

## 2024-02-21 LAB — RESP PANEL BY RT-PCR (RSV, FLU A&B, COVID)  RVPGX2
Influenza A by PCR: NEGATIVE
Influenza B by PCR: NEGATIVE
Resp Syncytial Virus by PCR: NEGATIVE
SARS Coronavirus 2 by RT PCR: NEGATIVE

## 2024-02-21 LAB — TROPONIN I (HIGH SENSITIVITY)
Troponin I (High Sensitivity): 12 ng/L (ref <18)
Troponin I (High Sensitivity): 12 ng/L (ref <18)

## 2024-02-21 LAB — PROTIME-INR
INR: 1.3 — ABNORMAL HIGH (ref 0.8–1.2)
Prothrombin Time: 16.7 s — ABNORMAL HIGH (ref 11.4–15.2)

## 2024-02-21 MED ORDER — IOHEXOL 350 MG/ML SOLN
100.0000 mL | Freq: Once | INTRAVENOUS | Status: AC | PRN
  Administered 2024-02-21: 75 mL via INTRAVENOUS

## 2024-02-21 NOTE — ED Provider Notes (Signed)
-----------------------------------------   11:33 PM on 02/21/2024 -----------------------------------------  Blood pressure (!) 152/67, pulse (!) 57, temperature 97.6 F (36.4 C), resp. rate 10, height 5\' 2"  (1.575 m), weight 81.6 kg, SpO2 93%.  Assuming care from Dr. Derrill Kay.  In short, [name] is a 83 y.o. female with a chief complaint of Chest Pain .  Refer to the original H&P for additional details.  The current plan of care is to follow-up CTA chest to rule out PE.   ----------------------------------------- 12:54 AM on 02/22/2024 ----------------------------------------- CTA chest is negative for PE but does show moderate to extensive groundglass opacities, differential includes infectious or phonatory etiology per radiology.  Patient able to ambulate here in the ED without oxygen desaturation, does have some dyspnea when walking.  She was offered admission to the hospital, but declines which is reasonable given reassuring vital signs.  We will treat for pneumonia and she was encouraged to follow-up with her pulmonologist.  She was counseled to return to the ED for new or worsening symptoms, patient agrees with plan.      [name], MD 02/22/24 [phone]

## 2024-02-21 NOTE — ED Triage Notes (Addendum)
 Pt ambulatory to triage.  Pt reports chest pain, sob and chills for 1 week.  Sx worse today.  No n/v.  No cough, fever.  Non smoker.  Pt reports headache dizziness for past week.  Hx cva  Pt alert  speech clear

## 2024-02-21 NOTE — Telephone Encounter (Signed)
 Pt's pharmacy is requesting a refill on pt's medication furosemide. Pt was supposed to take for 4 days and then stop. Does Dr. Elberta Fortis want pt to continue taking this medication? Please address

## 2024-02-21 NOTE — Telephone Encounter (Signed)
 Pt c/o Shortness Of Breath: STAT if SOB developed within the last 24 hours or pt is noticeably SOB on the phone  1. Are you currently SOB (can you hear that pt is SOB on the phone)? no  2. How long have you been experiencing SOB? 1 week But getting worse 3. Are you SOB when sitting or when up moving around? Moving around  4. Are you currently experiencing any other symptoms? Chest heaviness, arms feel heavy

## 2024-02-21 NOTE — ED Provider Notes (Signed)
 [facility] Provider Note    Event Date/Time   First MD Initiated Contact with Patient 02/21/24 2207     (approximate)   History   Chest Pain   HPI  Emma Rivera is a 83 y.o. female who presents to the emergency department today because of concerns for shortness of breath.  The patient states that she has been short of breath over the past couple of weeks.  She notices it primarily when she exerts herself.  However today it was significantly worse.  She states she cannot walk more than 15 feet without getting short of breath.  She denies any significant chest pain although has had some chest tightness.     Physical Exam   Triage Vital Signs: ED Triage Vitals  Encounter Vitals Group     BP 02/21/24 1712 (!) 143/66     Systolic BP Percentile --      Diastolic BP Percentile --      Pulse Rate 02/21/24 1712 (!) 59     Resp 02/21/24 1712 20     Temp 02/21/24 1712 97.8 F (36.6 C)     Temp Source 02/21/24 1712 Oral     SpO2 02/21/24 1712 93 %     Weight 02/21/24 1713 180 lb (81.6 kg)     Height 02/21/24 1713 5\' 2"  (1.575 m)     Head Circumference --      Peak Flow --      Pain Score 02/21/24 1713 5     Pain Loc --      Pain Education --      Exclude from Growth Chart --     Most recent vital signs: Vitals:   02/21/24 1712 02/21/24 1952  BP: (!) 143/66 (!) 152/67  Pulse: (!) 59 61  Resp: 20 17  Temp: 97.8 F (36.6 C) 97.6 F (36.4 C)  SpO2: 93% 95%   General: Awake, alert, oriented. CV:  Good peripheral perfusion. Regular rate and rhythm. Resp:  Normal effort. Lungs clear. Abd:  No distention.    ED Results / Procedures / Treatments   Labs (all labs ordered are listed, but only abnormal results are displayed) Labs Reviewed  BASIC METABOLIC PANEL WITH GFR - Abnormal; Notable for the following components:      Result Value   Glucose, Bld 101 (*)    BUN 24 (*)    Creatinine, Ser 1.07 (*)    GFR, Estimated 52 (*)    All other  components within normal limits  CBC - Abnormal; Notable for the following components:   RBC 3.61 (*)    Hemoglobin 11.6 (*)    HCT 35.7 (*)    All other components within normal limits  PROTIME-INR - Abnormal; Notable for the following components:   Prothrombin Time 16.7 (*)    INR 1.3 (*)    All other components within normal limits  RESP PANEL BY RT-PCR (RSV, FLU A&B, COVID)  RVPGX2  TROPONIN I (HIGH SENSITIVITY)  TROPONIN I (HIGH SENSITIVITY)     EKG  I, Phineas Semen, attending physician, personally viewed and interpreted this EKG  EKG Time: 2249 Rate: 103 Rhythm: sinus tachycardia Axis: normal Intervals: qtc 394 QRS: low voltage ST changes: no st elevation Impression: abnormal ekg    RADIOLOGY I independently interpreted and visualized the CXR. My interpretation: No pneumonia Radiology interpretation:  IMPRESSION:  Stable chest with no acute process.    I independently interpreted and visualized the CT head.  My interpretation: No ICH Radiology interpretation:  IMPRESSION:  1. No acute intracranial process.  2. 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    I independently interpreted and visualized the CTAPE. My interpretation: No large PE Radiology interpretation: Pending    PROCEDURES:  Critical Care performed: No   MEDICATIONS ORDERED IN ED: Medications - No data to display   IMPRESSION / MDM / ASSESSMENT AND PLAN / ED COURSE  I reviewed the triage vital signs and the nursing notes.                              Differential diagnosis includes, but is not limited to, pneumonia, viral illness, PE, COPD, CHF  Patient's presentation is most consistent with acute presentation with potential threat to life or bodily function.   The patient is on the cardiac monitor to evaluate for evidence of arrhythmia and/or significant heart rate changes.  Patient presented to the emergency  department today because of concerns for worsening shortness of breath specifically with exertion.  On exam patient's lungs are clear.  Patient is afebrile.  Chest x-ray and CT scan performed from triage without any concerning abnormalities.  Blood work with slight anemia however no significant change from previous values.  Troponin negative x 2.  BNP very minimally elevated.  Will check CT angio to evaluate for PE or occult pneumonia.  Additionally awaiting viral panel at time of signout.      FINAL CLINICAL IMPRESSION(S) / ED DIAGNOSES   Final diagnoses:  Shortness of breath     Note:  This document was prepared using Dragon voice recognition software and may include unintentional dictation errors.    Phineas Semen, MD 02/21/24 [id number]

## 2024-02-21 NOTE — Telephone Encounter (Signed)
 Patient called to report experiencing extreme shortness of breath and stated she is unable to move from one room to another without needing to rest. She also reported heaviness in her chest and arms, as well as bilateral leg swelling. The patient confirmed she is currently experiencing these symptoms.  Based on her current symptoms, the nurse recommended that the patient go to the emergency room for further evaluation. The patient verbalized understanding.

## 2024-02-22 MED ORDER — CEFUROXIME AXETIL 500 MG PO TABS
500.0000 mg | ORAL_TABLET | Freq: Once | ORAL | Status: AC
  Administered 2024-02-22: 500 mg via ORAL
  Filled 2024-02-22: qty 1

## 2024-02-22 MED ORDER — DOXYCYCLINE HYCLATE 100 MG PO CAPS: 100.0000 mg | ORAL_CAPSULE | Freq: Two times a day (BID) | ORAL | 0 refills | Status: AC

## 2024-02-22 MED ORDER — CEFUROXIME AXETIL 500 MG PO TABS: 500.0000 mg | ORAL_TABLET | Freq: Two times a day (BID) | ORAL | 0 refills | Status: AC

## 2024-02-28 ENCOUNTER — Other Ambulatory Visit: Payer: Self-pay | Admitting: Cardiology

## 2024-02-28 ENCOUNTER — Other Ambulatory Visit: Payer: Self-pay | Admitting: Internal Medicine

## 2024-03-07 NOTE — Telephone Encounter (Signed)
 Spoke to pt who reports doing well.  Asked pt if she needed more Lasix  tabs (even though original Rx was for only a few days w/ extra tablets if needed later), she responded no. Not sure why pharmacy is asking for refill as medication was sent in with no refills and pt states she did not request a refill...[name] AasAaron AasAaron Aas so NO to refill.

## 2024-04-23 ENCOUNTER — Other Ambulatory Visit: Payer: Self-pay | Admitting: Surgery

## 2024-04-23 DIAGNOSIS — M4807 Spinal stenosis, lumbosacral region: Secondary | ICD-10-CM

## 2024-04-29 ENCOUNTER — Ambulatory Visit
Admission: RE | Admit: 2024-04-29 | Discharge: 2024-04-29 | Disposition: A | Discharge status: Home / Self Care | Source: Ambulatory Visit | Attending: Surgery | Admitting: Surgery

## 2024-04-29 DIAGNOSIS — M4807 Spinal stenosis, lumbosacral region: Secondary | ICD-10-CM | POA: Diagnosis present

## 2024-05-04 NOTE — Progress Notes (Unsigned)
  Electrophysiology Office Note:   Date:  05/05/2024  ID:  Emma Rivera, DOB Feb 12, 1941, MRN [id number]  Primary Cardiologist: Lonni Hanson, MD Electrophysiologist: Soyla Gladis Norton, MD      History of Present Illness:   Emma Rivera is a 83 y.o. female with h/o persistent AF, hypercoagulable state, OSA, HTN, and DOE seen today for routine electrophysiology followup.   Since last being seen in our clinic the patient reports doing very well. No breakthrough AF of which she is aware. Currently, she denies chest pain, palpitations, dyspnea, PND, orthopnea, nausea, vomiting, dizziness, syncope, edema, weight gain, or early satiety.   Review of systems complete and found to be negative unless listed in HPI.   EP Information / Studies Reviewed:    EKG is ordered today. Personal review as below.  EKG Interpretation Date/Time:  Monday May 05 2024 10:24:52 EDT Ventricular Rate:  57 PR Interval:  168 QRS Duration:  78 QT Interval:  458 QTC Calculation: 445 R Axis:   35  Text Interpretation: Sinus bradycardia When compared with ECG of 21-Feb-2024 17:16, Nonspecific T wave abnormality no longer evident in Anterior leads Confirmed by Lesia Sharper [phone]) on 05/05/2024 10:35:25 AM    Arrhythmia/Device History No specialty comments available.   Physical Exam:   VS:  BP (!) 169/68   Pulse (!) 57   Ht 5' 2 (1.575 m)   Wt 174 lb 6.4 oz (79.1 kg)   SpO2 95%   BMI 31.90 kg/m    Wt Readings from Last 3 Encounters:  05/05/24 174 lb 6.4 oz (79.1 kg)  04/29/24 180 lb (81.6 kg)  02/21/24 180 lb (81.6 kg)     GEN: No acute distress NECK: No JVD; No carotid bruits CARDIAC: Regular rate and rhythm, no murmurs, rubs, gallops RESPIRATORY:  Clear to auscultation without rales, wheezing or rhonchi  ABDOMEN: Soft, non-tender, non-distended EXTREMITIES:  No edema; No deformity   ASSESSMENT AND PLAN:    Persistent AF EKG today shows sinus bradycardia Continue eliquis  5 mg BID for  CHA2DS2/VASc of at least 6 Continue amiodarone  200 mg daily Surveillance labs today.  Secondary hypercoagulable state Pt on Eliquis  as above  OSA  Encouraged nightly CPAP  HTN Stable on current regimen    Follow up with [place] EP Team in in 6 months  Signed, Sharper Prentice Lesia, PA-C

## 2024-05-05 ENCOUNTER — Ambulatory Visit: Attending: Student | Admitting: Student

## 2024-05-05 ENCOUNTER — Encounter: Payer: Self-pay | Admitting: Student

## 2024-05-05 VITALS — BP 169/68 | HR 57 | Ht 62.0 in | Wt 174.4 lb

## 2024-05-05 DIAGNOSIS — R0602 Shortness of breath: Secondary | ICD-10-CM

## 2024-05-05 DIAGNOSIS — G4733 Obstructive sleep apnea (adult) (pediatric): Secondary | ICD-10-CM

## 2024-05-05 DIAGNOSIS — I4819 Other persistent atrial fibrillation: Secondary | ICD-10-CM | POA: Diagnosis not present

## 2024-05-05 DIAGNOSIS — D6869 Other thrombophilia: Secondary | ICD-10-CM | POA: Diagnosis not present

## 2024-05-05 DIAGNOSIS — I10 Essential (primary) hypertension: Secondary | ICD-10-CM

## 2024-05-05 DIAGNOSIS — Z9189 Other specified personal risk factors, not elsewhere classified: Secondary | ICD-10-CM

## 2024-05-05 DIAGNOSIS — Z79899 Other long term (current) drug therapy: Secondary | ICD-10-CM

## 2024-05-05 NOTE — Patient Instructions (Signed)
 Medication Instructions:  Your physician recommends that you continue on your current medications as directed. Please refer to the Current Medication list given to you today.  *If you need a refill on your cardiac medications before your next appointment, please call your pharmacy*  Lab Work: CMET, TSH, FreeT4 If you have labs (blood work) drawn today and your tests are completely normal, you will receive your results only by: MyChart Message (if you have MyChart) OR A paper copy in the mail If you have any lab test that is abnormal or we need to change your treatment, we will call you to review the results.  Follow-Up: At [facility], you and your health needs are our priority.  As part of our continuing mission to provide you with exceptional heart care, our providers are all part of one team.  This team includes your primary Cardiologist (physician) and Advanced Practice Providers or APPs (Physician Assistants and Nurse Practitioners) who all work together to provide you with the care you need, when you need it.  Your next appointment:   6 month(s)  Provider:   Suzann Riddle, NP

## 2024-05-06 ENCOUNTER — Other Ambulatory Visit: Payer: Self-pay | Admitting: *Deleted

## 2024-05-06 ENCOUNTER — Ambulatory Visit: Payer: Self-pay | Admitting: Student

## 2024-05-06 LAB — COMPREHENSIVE METABOLIC PANEL WITH GFR
ALT: 37 IU/L — ABNORMAL HIGH (ref 0–32)
AST: 37 IU/L (ref 0–40)
Albumin: 4.4 g/dL (ref 3.7–4.7)
Alkaline Phosphatase: 52 IU/L (ref 44–121)
BUN/Creatinine Ratio: 18 (ref 12–28)
BUN: 17 mg/dL (ref 8–27)
Bilirubin Total: 0.5 mg/dL (ref 0.0–1.2)
CO2: 25 mmol/L (ref 20–29)
Calcium: 9.4 mg/dL (ref 8.7–10.3)
Chloride: 101 mmol/L (ref 96–106)
Creatinine, Ser: 0.96 mg/dL (ref 0.57–1.00)
Globulin, Total: 2.1 g/dL (ref 1.5–4.5)
Glucose: 92 mg/dL (ref 70–99)
Potassium: 4.5 mmol/L (ref 3.5–5.2)
Sodium: 142 mmol/L (ref 134–144)
Total Protein: 6.5 g/dL (ref 6.0–8.5)
eGFR: 59 mL/min/{1.73_m2} — ABNORMAL LOW (ref >59)

## 2024-05-06 LAB — TSH: TSH: 15.6 u[IU]/mL — ABNORMAL HIGH (ref 0.450–4.500)

## 2024-05-06 LAB — T4, FREE: Free T4: 0.73 ng/dL — ABNORMAL LOW (ref 0.82–1.77)

## 2024-05-06 MED ORDER — AMIODARONE HCL 200 MG PO TABS: 100.0000 mg | ORAL_TABLET | Freq: Every day | ORAL | Status: DC

## 2024-06-25 ENCOUNTER — Other Ambulatory Visit: Payer: Self-pay | Admitting: Family Medicine

## 2024-06-25 DIAGNOSIS — Z1231 Encounter for screening mammogram for malignant neoplasm of breast: Secondary | ICD-10-CM

## 2024-07-15 ENCOUNTER — Ambulatory Visit
Admission: RE | Admit: 2024-07-15 | Discharge: 2024-07-15 | Disposition: A | Discharge status: Home / Self Care | Source: Ambulatory Visit | Attending: Family Medicine | Admitting: Family Medicine

## 2024-07-15 DIAGNOSIS — Z1231 Encounter for screening mammogram for malignant neoplasm of breast: Secondary | ICD-10-CM | POA: Insufficient documentation

## 2024-07-25 ENCOUNTER — Ambulatory Visit: Admitting: Internal Medicine

## 2024-08-04 ENCOUNTER — Ambulatory Visit: Admitting: Cardiology

## 2024-08-05 ENCOUNTER — Encounter: Payer: Self-pay | Admitting: Cardiology

## 2024-08-05 ENCOUNTER — Ambulatory Visit: Attending: Cardiology | Admitting: Cardiology

## 2024-08-05 VITALS — BP 140/60 | HR 55 | Ht 62.0 in | Wt 177.8 lb

## 2024-08-05 DIAGNOSIS — R001 Bradycardia, unspecified: Secondary | ICD-10-CM

## 2024-08-05 DIAGNOSIS — I4819 Other persistent atrial fibrillation: Secondary | ICD-10-CM | POA: Diagnosis not present

## 2024-08-05 DIAGNOSIS — R0602 Shortness of breath: Secondary | ICD-10-CM | POA: Diagnosis not present

## 2024-08-05 DIAGNOSIS — E039 Hypothyroidism, unspecified: Secondary | ICD-10-CM

## 2024-08-05 DIAGNOSIS — I10 Essential (primary) hypertension: Secondary | ICD-10-CM | POA: Diagnosis not present

## 2024-08-05 DIAGNOSIS — G4733 Obstructive sleep apnea (adult) (pediatric): Secondary | ICD-10-CM

## 2024-08-05 DIAGNOSIS — R0609 Other forms of dyspnea: Secondary | ICD-10-CM

## 2024-08-05 MED ORDER — TORSEMIDE 10 MG PO TABS: 10.0000 mg | ORAL_TABLET | ORAL | 3 refills | Status: DC

## 2024-08-05 NOTE — Patient Instructions (Signed)
 Medication Instructions:  Your physician recommends the following medication changes.  DECREASE: Torsemide  10 mg 3 times week  *If you need a refill on your cardiac medications before your next appointment, please call your pharmacy*  Lab Work: Your provider would like for you to have following labs drawn today BMP, BNP, CBC, D-dimer.   If you have labs (blood work) drawn today and your tests are completely normal, you will receive your results only by: MyChart Message (if you have MyChart) OR A paper copy in the mail If you have any lab test that is abnormal or we need to change your treatment, we will call you to review the results.  Testing/Procedures: Your physician has requested that you have an echocardiogram. Echocardiography is a painless test that uses sound waves to create images of your heart. It provides your doctor with information about the size and shape of your heart and how well your heart's chambers and valves are working.   You may receive an ultrasound enhancing agent through an IV if needed to better visualize your heart during the echo. This procedure takes approximately one hour.  There are no restrictions for this procedure.  This will take place at [address] [facility] [address], [place] [address]  Please note: We ask at that you not bring children with you during ultrasound (echo/ vascular) testing. Due to room size and safety concerns, children are not allowed in the ultrasound rooms during exams. Our front office staff cannot provide observation of children in our lobby area while testing is being conducted. An adult accompanying a patient to their appointment will only be allowed in the ultrasound room at the discretion of the ultrasound technician under special circumstances. We apologize for any inconvenience.   Follow-Up: At [facility], you and your health needs are our priority.  As part of our continuing mission to provide you  with exceptional heart care, our providers are all part of one team.  This team includes your primary Cardiologist (physician) and Advanced Practice Providers or APPs (Physician Assistants and Nurse Practitioners) who all work together to provide you with the care you need, when you need it.  Your next appointment:   6 - 8 week(s)  Provider:   You may see Lonni Hanson, MD or one of the following Advanced Practice Providers on your designated Care Team:   Lonni Meager, NP Lesley Maffucci, PA-C Bernardino Bring, PA-C Cadence Ephraim, PA-C Tylene Lunch, NP Barnie Hila, NP

## 2024-08-05 NOTE — Progress Notes (Signed)
 Cardiology Office Note   Date:  08/05/2024  ID:  Emma Rivera, DOB 1941/03/08, MRN [id number] PCP: Alla Amis, MD  [facility] Providers Cardiologist:  Lonni Hanson, MD Electrophysiologist:  Soyla Gladis Norton, MD     History of Present Illness Emma Rivera is a 83 y.o. female with a past medical history of stroke (12/2018) subsequent diagnosis of paroxysmal atrial fibrillation status post ablation (05/2019), COVID-19 infection (11/2020), hypertension, GERD, esophageal spasm, obstructive sleep apnea, presents today for follow-up.   Status post A-fib ablation 06/02/2019 and 07/03/2023 for both completed by Dr. Norton.  She was admitted 12/17-19/2024 with atrial fibrillation with RVR.  Amiodarone  was started during her hospitalization.  Ventricular rates were well-controlled at discharge but she remained in atrial fibrillation.  She was last seen in clinic 11/19/2023 and converted to sinus rhythm approximately 3 days prior.  She wears a Fitbit and also has a cardia mobile.  Her pulse was previously 80-90 bpm and dropped 3 days ago into the 50s.  She continues to take amiodarone  200 mg daily and apixaban  5 mg twice daily.  She continued to be noncompliant with CPAP but was advised to follow-up with pulmonary.   She was seen in clinic 12/12/2023, heart failure Concerned about elevated blood pressure.  Blood pressures tend to be 181/92 at home.  She has subsequently restarted 5 mg of amlodipine  that was discontinued during recent hospitalization.  Her losartan  dose was also cut in half from 100 mg to 50 mg daily.  She had followed up with orthopedic provider and an ultrasound was done of her leg that was negative for DVT.  Blood pressure was still optimally controlled at 158/66 156/70 on return.  Her losartan  was increased to 100 mg daily.  She was encouraged to continue to monitor her pressure 1 to 2 hours postmedication administration to evaluate effectiveness of her current medication after  adjusting dose of losartan .   She was last seen in clinic 05/05/2024 for routine EP follow-up.  She did not have breakthrough A-fib which she was aware of.  She denied any associated symptoms.  EKG revealed sinus bradycardia.  She was continued on apixaban  5 mg twice daily for CHA2DS2-VASc score of at least 6 and amiodarone  200 mg daily.  Blood pressure continued to be elevated.  She returns clinic today with several concerns a list of issues.  She has been suffering from shortness of breath and dyspnea on exertion since December but has been getting progressive and worsening over the last couple of weeks.  She states that she has 10 steps in her home and is unable to make it to bed 10 steps without having to stop and take a break at 2 or 3 times.  She stated walking into clinic today she had a stop 4 separate occasions to catch her breath prior to being able to continue.  She states that she has used her inhaler to prevent bronchospasms and is not helping.  She stated that she had followed up with pulmonary and has had several lab studies completed that have been unrevealing and was started on torsemide  where she stated that she lost 6 pounds in 1 day and then felt horrible and stopped taking the medication.  She stated that her thyroid  has been out of whack and her Synthroid has been having to be adjusted.  She states that she has worsening peripheral edema.  She has not been able to sleep and she has not been able to eat.  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  She states that she has been compliant with her current medication regimen.  She said her amiodarone  dosing was reduced to a few months back due to worsening thyroid  function.  She denies any recent hospitalizations or visits to the emergency department.  ROS: 10 point review of systems has been reviewed and considered  negative except ones been listed in the HPI  Studies Reviewed EKG Interpretation Date/Time:  Tuesday August 05 2024 10:19:11 EDT Ventricular Rate:  55 PR Interval:  144 QRS Duration:  82 QT Interval:  480 QTC Calculation: 459 R Axis:   50  Text Interpretation: Sinus bradycardia When compared with ECG of 05-May-2024 10:24, No significant change was found Confirmed by Gerard Frederick ([id number]) on 08/05/2024 10:22:49 AM    Long term monitor, 10/09/2023 Patient had a min HR of 51 bpm, max HR of 210 bpm, and avg HR of 64bpm.  Predominant underlying rhythm was Sinus Rhythm.  37 Supraventricular Tachycardia runs occurred, all less than 17 beats  3.3% supraventricular ectopy Less than 1% ventricular ectopy No patient triggered episodes recorded   Long term monitor, 04/02/2023 Predominant rhythm was sinus rhythm Less than 1% ventricular and supraventricular ectopy burden No atrial fibrillation noted Patient triggered episodes associated with sinus rhythm   TTE, 03/23/2023  1. Left ventricular ejection fraction, by estimation, is 60 to 65%. The left ventricle has normal function. The left ventricle has no regional wall motion abnormalities. Left ventricular diastolic parameters are consistent with Grade II diastolic dysfunction (pseudonormalization).   2. Right ventricular systolic function is normal. The right ventricular size is normal. There is normal pulmonary artery systolic pressure.   3. Left atrial size was moderately dilated.   4. Right atrial size was mildly dilated.   5. The mitral valve is normal in structure. No evidence of mitral valve regurgitation. No evidence of mitral stenosis.   6. The aortic valve is normal in structure. Aortic valve regurgitation is not visualized. Aortic valve sclerosis/calcification is present, without any evidence of aortic stenosis.   7. The inferior vena cava is normal in size with greater than 50% respiratory variability, suggesting right atrial  pressure of 3 mmHg.   Risk Assessment/Calculations  CHA2DS2-VASc Score = 6   This indicates a 9.7% annual risk of stroke. The patient's score is based upon: CHF History: 0 HTN History: 1 Diabetes History: 0 Stroke History: 2 Vascular Disease History: 0 Age Score: 2 Gender Score: 1         Physical Exam VS:  BP (!) 140/60 (BP Location: Left Arm, Patient Position: Sitting, Cuff Size: Normal)   Pulse (!) 55   Ht 5' 2 (1.575 m)   Wt 177 lb 12.8 oz (80.6 kg)   SpO2 99%   BMI 32.52 kg/m        Wt Readings from Last 3 Encounters:  08/05/24 177 lb 12.8 oz (80.6 kg)  05/05/24 174 lb 6.4 oz (79.1 kg)  04/29/24 180 lb (81.6 kg)    GEN: Well nourished, well developed in no acute distress NECK: No JVD; No carotid bruits CARDIAC: RRR, bradycardia, no murmurs, rubs, gallops RESPIRATORY:  Clear with diminished bases to auscultation without rales, wheezing or rhonchi  ABDOMEN: Soft, non-tender, non-distended EXTREMITIES:  1+ pitting edema to BLE; No deformity   ASSESSMENT AND PLAN Worsening progressive shortness of breath and dyspnea on  exertion with moderate persistent asthma also concerning for heart failure.  Recently followed up with pulmonary and had normal PFTs, previous improvement with prednisone but no improvement with Fasenra after three doses.  Recently placed back on prednisone.  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  Pulmonary recently sent blood work for amiodarone  induced pulmonary toxicity to rule out interstitial lung disease.  She has been scheduled for an updated echocardiogram.  She is also being sent for a BN P, CBC, BMP and a D-dimer today.  With amiodarone  being recently reduced to 100 mg daily with extensive half-life of amiodarone  and amiodarone  has been discontinued today.  Primary hypertension with a blood pressure of 140/68.  Blood pressure slightly elevated as  patient has had to do large quantity of walking into the clinic.  She has been continued on amlodipine  10 mg daily, hydrochlorothiazide  25 mg daily, losartan  50 mg daily, and torsemide  10 mg 3 days weekly.  She has been encouraged to continue to monitor her blood pressure 1 to 2 hours postmedication administration as well.  Was starting her torsemide  back in 3 days weekly as she has self discontinued she will need to follow-up and bmp on return.  Persistent atrial fibrillation status post ablation with sinus bradycardia with a rate of 55 noted on EKG today with no significant ischemic changes.  She has been continued on apixaban  5 mg twice daily for CHA2DS2-VASc score of at least 7 for stroke prophylaxis and amiodarone  unfortunately had to be discontinued today due to her ongoing symptoms.  Obstructive sleep apnea which she is trying to be compliant with her CPAP.  Ongoing management per pulmonary.  Sinus bradycardia when she appears to be asymptomatic and chronotropically appropriate.  Will continue to monitor with surveillance studies.  Hypothyroidism with a TSH of 8.373 improved from 15 she was continued on levothyroxine with ongoing management per endocrinology.       Dispo: Patient to return to clinic to see MD/APP once testing is completed or sooner if needed for further evaluation.  Signed, Londell Noll, NP

## 2024-08-06 LAB — CBC
Hematocrit: 38.2 % (ref 34.0–46.6)
Hemoglobin: 12.5 g/dL (ref 11.1–15.9)
MCH: 32.8 pg (ref 26.6–33.0)
MCHC: 32.7 g/dL (ref 31.5–35.7)
MCV: 100 fL — ABNORMAL HIGH (ref 79–97)
Platelets: 190 x10E3/uL (ref 150–450)
RBC: 3.81 x10E6/uL (ref 3.77–5.28)
RDW: 13.1 % (ref 11.7–15.4)
WBC: 7.7 x10E3/uL (ref 3.4–10.8)

## 2024-08-06 LAB — BASIC METABOLIC PANEL WITH GFR
BUN/Creatinine Ratio: 18 (ref 12–28)
BUN: 21 mg/dL (ref 8–27)
CO2: 24 mmol/L (ref 20–29)
Calcium: 9.8 mg/dL (ref 8.7–10.3)
Chloride: 103 mmol/L (ref 96–106)
Creatinine, Ser: 1.19 mg/dL — ABNORMAL HIGH (ref 0.57–1.00)
Glucose: 86 mg/dL (ref 70–99)
Potassium: 4.2 mmol/L (ref 3.5–5.2)
Sodium: 144 mmol/L (ref 134–144)
eGFR: 46 mL/min/1.73 — ABNORMAL LOW (ref >59)

## 2024-08-06 LAB — D-DIMER, QUANTITATIVE: D-DIMER: 0.32 mg{FEU}/L (ref 0.00–0.49)

## 2024-08-06 LAB — BRAIN NATRIURETIC PEPTIDE: BNP: 135.7 pg/mL — ABNORMAL HIGH (ref 0.0–100.0)

## 2024-08-07 ENCOUNTER — Ambulatory Visit: Payer: Self-pay | Admitting: Cardiology

## 2024-08-07 DIAGNOSIS — I10 Essential (primary) hypertension: Secondary | ICD-10-CM

## 2024-08-07 NOTE — Progress Notes (Signed)
 Will continue with previously prescribed diuretic therapy three days weekly, BNP is slightly elevated but not significant, D-dimer negative so decreased concern of a blood clot.  Recommend repeating BMP in 2 weeks after being on diuretic therapy.

## 2024-08-20 ENCOUNTER — Telehealth: Payer: Self-pay | Admitting: Internal Medicine

## 2024-08-20 NOTE — Telephone Encounter (Signed)
 Attempted to transfer, triage phone rang and then was busy.

## 2024-08-20 NOTE — Telephone Encounter (Signed)
 Pt c/o medication issue:  1. Name of Medication: amiodarone    2. How are you currently taking this medication (dosage and times per day)? Discontinued   3. Are you having a reaction (difficulty breathing--STAT)? No  4. What is your medication issue? Pts husband calling to state she was advised to discontinue medication. She is currently experiencing severe dizziness. Please advise.   Call transferred

## 2024-08-20 NOTE — Telephone Encounter (Signed)
 Patient with complaint of dizziness that started yesterday. Patient reports that she had a fall today from the dizziness. Patient reports that her vital signs have been stable. Patient most recent blood pressure 110/57 and heart rate 62. Patient concerned symptoms coming from recently stopping amiodarone . Patient scheduled to be seen in clinic 08/21/24. Patient made aware of ED precaution should any new symptoms develop or worsen.

## 2024-08-21 ENCOUNTER — Ambulatory Visit: Attending: Cardiology | Admitting: Cardiology

## 2024-08-21 ENCOUNTER — Encounter: Payer: Self-pay | Admitting: Cardiology

## 2024-08-21 ENCOUNTER — Inpatient Hospital Stay
Admission: EM | Admit: 2024-08-21 | Discharge: 2024-08-26 | DRG: 811 | Disposition: A | Discharge status: Home / Self Care | Attending: Internal Medicine | Admitting: Internal Medicine

## 2024-08-21 ENCOUNTER — Encounter: Payer: Self-pay | Admitting: Emergency Medicine

## 2024-08-21 ENCOUNTER — Emergency Department

## 2024-08-21 ENCOUNTER — Other Ambulatory Visit: Payer: Self-pay

## 2024-08-21 VITALS — BP 110/72 | HR 60 | Ht 62.0 in | Wt 169.6 lb

## 2024-08-21 DIAGNOSIS — R0609 Other forms of dyspnea: Secondary | ICD-10-CM | POA: Diagnosis present

## 2024-08-21 DIAGNOSIS — Z6831 Body mass index (BMI) 31.0-31.9, adult: Secondary | ICD-10-CM

## 2024-08-21 DIAGNOSIS — Z7952 Long term (current) use of systemic steroids: Secondary | ICD-10-CM

## 2024-08-21 DIAGNOSIS — K254 Chronic or unspecified gastric ulcer with hemorrhage: Secondary | ICD-10-CM | POA: Diagnosis not present

## 2024-08-21 DIAGNOSIS — R001 Bradycardia, unspecified: Secondary | ICD-10-CM

## 2024-08-21 DIAGNOSIS — I951 Orthostatic hypotension: Secondary | ICD-10-CM | POA: Diagnosis present

## 2024-08-21 DIAGNOSIS — K921 Melena: Principal | ICD-10-CM | POA: Diagnosis present

## 2024-08-21 DIAGNOSIS — S92115A Nondisplaced fracture of neck of left talus, initial encounter for closed fracture: Secondary | ICD-10-CM | POA: Diagnosis present

## 2024-08-21 DIAGNOSIS — Z87442 Personal history of urinary calculi: Secondary | ICD-10-CM

## 2024-08-21 DIAGNOSIS — K5731 Diverticulosis of large intestine without perforation or abscess with bleeding: Secondary | ICD-10-CM | POA: Diagnosis present

## 2024-08-21 DIAGNOSIS — N179 Acute kidney failure, unspecified: Secondary | ICD-10-CM | POA: Diagnosis present

## 2024-08-21 DIAGNOSIS — Z8673 Personal history of transient ischemic attack (TIA), and cerebral infarction without residual deficits: Secondary | ICD-10-CM

## 2024-08-21 DIAGNOSIS — Z9842 Cataract extraction status, left eye: Secondary | ICD-10-CM | POA: Diagnosis not present

## 2024-08-21 DIAGNOSIS — E039 Hypothyroidism, unspecified: Secondary | ICD-10-CM | POA: Diagnosis present

## 2024-08-21 DIAGNOSIS — Z79890 Hormone replacement therapy (postmenopausal): Secondary | ICD-10-CM | POA: Diagnosis not present

## 2024-08-21 DIAGNOSIS — G4733 Obstructive sleep apnea (adult) (pediatric): Secondary | ICD-10-CM

## 2024-08-21 DIAGNOSIS — R42 Dizziness and giddiness: Secondary | ICD-10-CM

## 2024-08-21 DIAGNOSIS — K219 Gastro-esophageal reflux disease without esophagitis: Secondary | ICD-10-CM | POA: Diagnosis present

## 2024-08-21 DIAGNOSIS — W1839XA Other fall on same level, initial encounter: Secondary | ICD-10-CM | POA: Diagnosis present

## 2024-08-21 DIAGNOSIS — Z66 Do not resuscitate: Secondary | ICD-10-CM | POA: Diagnosis present

## 2024-08-21 DIAGNOSIS — Z961 Presence of intraocular lens: Secondary | ICD-10-CM | POA: Diagnosis present

## 2024-08-21 DIAGNOSIS — I4819 Other persistent atrial fibrillation: Secondary | ICD-10-CM | POA: Diagnosis not present

## 2024-08-21 DIAGNOSIS — K640 First degree hemorrhoids: Secondary | ICD-10-CM | POA: Diagnosis present

## 2024-08-21 DIAGNOSIS — Z8249 Family history of ischemic heart disease and other diseases of the circulatory system: Secondary | ICD-10-CM

## 2024-08-21 DIAGNOSIS — R609 Edema, unspecified: Secondary | ICD-10-CM | POA: Diagnosis present

## 2024-08-21 DIAGNOSIS — Z7901 Long term (current) use of anticoagulants: Secondary | ICD-10-CM | POA: Diagnosis not present

## 2024-08-21 DIAGNOSIS — K5730 Diverticulosis of large intestine without perforation or abscess without bleeding: Secondary | ICD-10-CM | POA: Diagnosis present

## 2024-08-21 DIAGNOSIS — R531 Weakness: Secondary | ICD-10-CM

## 2024-08-21 DIAGNOSIS — I10 Essential (primary) hypertension: Secondary | ICD-10-CM | POA: Diagnosis present

## 2024-08-21 DIAGNOSIS — R0602 Shortness of breath: Secondary | ICD-10-CM | POA: Diagnosis not present

## 2024-08-21 DIAGNOSIS — E66811 Obesity, class 1: Secondary | ICD-10-CM | POA: Diagnosis present

## 2024-08-21 DIAGNOSIS — S92902A Unspecified fracture of left foot, initial encounter for closed fracture: Secondary | ICD-10-CM

## 2024-08-21 DIAGNOSIS — I639 Cerebral infarction, unspecified: Secondary | ICD-10-CM | POA: Diagnosis present

## 2024-08-21 DIAGNOSIS — K317 Polyp of stomach and duodenum: Secondary | ICD-10-CM | POA: Diagnosis present

## 2024-08-21 DIAGNOSIS — S92912A Unspecified fracture of left toe(s), initial encounter for closed fracture: Secondary | ICD-10-CM | POA: Diagnosis present

## 2024-08-21 DIAGNOSIS — K922 Gastrointestinal hemorrhage, unspecified: Secondary | ICD-10-CM | POA: Diagnosis present

## 2024-08-21 DIAGNOSIS — Z882 Allergy status to sulfonamides status: Secondary | ICD-10-CM

## 2024-08-21 DIAGNOSIS — Z888 Allergy status to other drugs, medicaments and biological substances status: Secondary | ICD-10-CM

## 2024-08-21 DIAGNOSIS — M25572 Pain in left ankle and joints of left foot: Secondary | ICD-10-CM

## 2024-08-21 DIAGNOSIS — I6300 Cerebral infarction due to thrombosis of unspecified precerebral artery: Secondary | ICD-10-CM | POA: Diagnosis not present

## 2024-08-21 DIAGNOSIS — D62 Acute posthemorrhagic anemia: Secondary | ICD-10-CM | POA: Diagnosis present

## 2024-08-21 DIAGNOSIS — Z79899 Other long term (current) drug therapy: Secondary | ICD-10-CM

## 2024-08-21 DIAGNOSIS — Z96652 Presence of left artificial knee joint: Secondary | ICD-10-CM | POA: Diagnosis present

## 2024-08-21 DIAGNOSIS — S92102A Unspecified fracture of left talus, initial encounter for closed fracture: Secondary | ICD-10-CM

## 2024-08-21 DIAGNOSIS — E785 Hyperlipidemia, unspecified: Secondary | ICD-10-CM | POA: Diagnosis present

## 2024-08-21 DIAGNOSIS — Z9841 Cataract extraction status, right eye: Secondary | ICD-10-CM

## 2024-08-21 DIAGNOSIS — M79672 Pain in left foot: Secondary | ICD-10-CM

## 2024-08-21 DIAGNOSIS — I4891 Unspecified atrial fibrillation: Secondary | ICD-10-CM | POA: Diagnosis present

## 2024-08-21 DIAGNOSIS — I480 Paroxysmal atrial fibrillation: Secondary | ICD-10-CM | POA: Diagnosis present

## 2024-08-21 HISTORY — DX: Sleep apnea, unspecified: G47.30

## 2024-08-21 LAB — COMPREHENSIVE METABOLIC PANEL WITH GFR
ALT: 20 U/L (ref 0–44)
AST: 26 U/L (ref 15–41)
Albumin: 3.9 g/dL (ref 3.5–5.0)
Alkaline Phosphatase: 29 U/L — ABNORMAL LOW (ref 38–126)
Anion gap: 8 (ref 5–15)
BUN: 56 mg/dL — ABNORMAL HIGH (ref 8–23)
CO2: 28 mmol/L (ref 22–32)
Calcium: 9.5 mg/dL (ref 8.9–10.3)
Chloride: 105 mmol/L (ref 98–111)
Creatinine, Ser: 1.44 mg/dL — ABNORMAL HIGH (ref 0.44–1.00)
GFR, Estimated: 36 mL/min — ABNORMAL LOW (ref >60)
Glucose, Bld: 105 mg/dL — ABNORMAL HIGH (ref 70–99)
Potassium: 3.5 mmol/L (ref 3.5–5.1)
Sodium: 141 mmol/L (ref 135–145)
Total Bilirubin: 0.6 mg/dL (ref 0.0–1.2)
Total Protein: 6.4 g/dL — ABNORMAL LOW (ref 6.5–8.1)

## 2024-08-21 LAB — CBC
HCT: 28.6 % — ABNORMAL LOW (ref 36.0–46.0)
Hemoglobin: 9.5 g/dL — ABNORMAL LOW (ref 12.0–15.0)
MCH: 32 pg (ref 26.0–34.0)
MCHC: 33.2 g/dL (ref 30.0–36.0)
MCV: 96.3 fL (ref 80.0–100.0)
Platelets: 226 K/uL (ref 150–400)
RBC: 2.97 MIL/uL — ABNORMAL LOW (ref 3.87–5.11)
RDW: 14.5 % (ref 11.5–15.5)
WBC: 9.7 K/uL (ref 4.0–10.5)
nRBC: 0 % (ref 0.0–0.2)

## 2024-08-21 LAB — PROTIME-INR
INR: 1.6 — ABNORMAL HIGH (ref 0.8–1.2)
Prothrombin Time: 20.2 s — ABNORMAL HIGH (ref 11.4–15.2)

## 2024-08-21 MED ORDER — ATORVASTATIN CALCIUM 20 MG PO TABS
20.0000 mg | ORAL_TABLET | Freq: Every day | ORAL | Status: DC
  Administered 2024-08-21 – 2024-08-24 (×4): 20 mg via ORAL
  Filled 2024-08-21 (×5): qty 1

## 2024-08-21 MED ORDER — MORPHINE SULFATE (PF) 2 MG/ML IV SOLN: 2.0000 mg | INTRAVENOUS | Status: DC | PRN

## 2024-08-21 MED ORDER — LEVOTHYROXINE SODIUM 50 MCG PO TABS
50.0000 ug | ORAL_TABLET | Freq: Every day | ORAL | Status: AC
Start: 2024-08-22 — End: ?
  Administered 2024-08-22 – 2024-08-26 (×4): 50 ug via ORAL
  Filled 2024-08-21 (×4): qty 1

## 2024-08-21 MED ORDER — SODIUM CHLORIDE 0.9 % IV BOLUS
500.0000 mL | Freq: Once | INTRAVENOUS | Status: AC
  Administered 2024-08-21: 500 mL via INTRAVENOUS

## 2024-08-21 MED ORDER — CITALOPRAM HYDROBROMIDE 20 MG PO TABS
20.0000 mg | ORAL_TABLET | Freq: Every morning | ORAL | Status: DC
  Administered 2024-08-22 – 2024-08-26 (×4): 20 mg via ORAL
  Filled 2024-08-21 (×4): qty 1

## 2024-08-21 MED ORDER — ACETAMINOPHEN 325 MG PO TABS: 650.0000 mg | ORAL_TABLET | Freq: Four times a day (QID) | ORAL | Status: DC | PRN

## 2024-08-21 MED ORDER — PANTOPRAZOLE SODIUM 40 MG IV SOLR
40.0000 mg | Freq: Once | INTRAVENOUS | Status: AC
  Administered 2024-08-21: 40 mg via INTRAVENOUS
  Filled 2024-08-21: qty 10

## 2024-08-21 MED ORDER — PANTOPRAZOLE SODIUM 40 MG IV SOLR
40.0000 mg | Freq: Two times a day (BID) | INTRAVENOUS | Status: DC
  Administered 2024-08-21 – 2024-08-26 (×9): 40 mg via INTRAVENOUS
  Filled 2024-08-21 (×9): qty 10

## 2024-08-21 MED ORDER — AMLODIPINE BESYLATE 10 MG PO TABS
10.0000 mg | ORAL_TABLET | Freq: Every day | ORAL | Status: DC
  Administered 2024-08-21: 10 mg via ORAL
  Filled 2024-08-21: qty 1

## 2024-08-21 MED ORDER — ACETAMINOPHEN 650 MG RE SUPP: 650.0000 mg | Freq: Four times a day (QID) | RECTAL | Status: DC | PRN

## 2024-08-21 MED ORDER — LOSARTAN POTASSIUM 50 MG PO TABS
50.0000 mg | ORAL_TABLET | Freq: Every day | ORAL | Status: DC
Start: 2024-08-21 — End: 2024-08-22
  Administered 2024-08-21: 50 mg via ORAL
  Filled 2024-08-21: qty 1

## 2024-08-21 MED ORDER — OXYCODONE HCL 5 MG PO TABS: 5.0000 mg | ORAL_TABLET | ORAL | Status: DC | PRN

## 2024-08-21 MED ORDER — SODIUM CHLORIDE 0.9 % IV SOLN: INTRAVENOUS | Status: AC

## 2024-08-21 NOTE — Plan of Care (Signed)

## 2024-08-21 NOTE — Consult Note (Signed)
 Ruel Kung , MD [address], [place], [place], [address] Phone: [phone] Fax: [phone]  Consultation  Referring Provider:     Dr Roann Primary Care Physician:  Alla Amis, MD Primary Gastroenterologist:  None          Reason for Consultation:   GI bleed  Date of Admission:  08/21/2024 Date of Consultation:  08/21/2024         HPI:   Emma Rivera is a 83 y.o. female presented to the ER earlier today with dark colored stool. She is on Eloquis for A fibb. Last dose this am 08/21/2024 hemoglobin electrophoresis 2 weeks back was 12.5 grams and today 9.5 grams. BUN 56 and cr 1.44.   Black stool started day before , last episode at 8 am today , no abdominal pain. Last week had an episode of severe heart burn . No recent egd or colonoscopy . Feels well now.   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 for possible thrombectomy; b.) repeat CT at [facility] revealed interval resolution of M2 occlusion --> admitted; c.) MRI brain 12/17/2018: acute LEFT MCA infarct with 3 punctate areas of restricted diffusion (largest area involved the LEFT insular cortex)   Atrial fibrillation (HCC) 12/16/2018   a.) new onset 12/16/2018; b.) CHA2DS2-VASc = 6 (age x2, sex, HTN, CVA x2) as of 10/19/2023; c.) s/p DCCV (120 J x 1) with ERAF 04/01/2019; d.) s/p ablation 06/13/2019; e.) s/p DCCV (150 J x1) 03/12/2023; f.) s/p repeat ablation 06/21/2023; g.) cardiac rate/rhythm currently maintained instrinsically without pharmacological intervention; chronically anticoagulated using standard dose apixaban    Baker's cyst of knee, left    Cerebral microvascular disease    DDD (degenerative disc disease), cervical    DDD (degenerative disc disease), lumbar    Depression    Diastolic dysfunction 03/23/2023   a.) TTE  03/23/2023: EF 60-65%, no RWMAs, G2DD, RVSF/RVSP normal, mod LAE, mild RAE, AoV sclerosis with no stenosis   Diverticulosis    Esophageal spasm    Essential hypertension    GERD (gastroesophageal reflux disease)    Heart murmur    History of bilateral cataract extraction 2016   History of kidney stones    History of stroke involving cerebellum    a.) date of neurovascular event unknown; infarct of the posterior inferior LEFT cerebellum noted on MRI brain done on 12/17/2018 performed in setting of acute LEFT MCA infarct   HOH (hard of hearing)    Hyperlipidemia    Moderate persistent asthma    NSVT (nonsustained ventricular tachycardia) (HCC) 01/21/2019   a.) Zio 01/21/2019: 2 runs NSVT lasting up to 18 beats at max rate of 174 bpm;  up to 4.6 second post-termination pauses   On apixaban  therapy    Osteoarthritis    Prediabetes    PSVT (paroxysmal supraventricular tachycardia) 10/09/2023   a.) Zio 10/09/2023: 37 runs each lasting < 17 seconds (max rate 210 bpm)    Past Surgical History:  Procedure Laterality Date   ATRIAL FIBRILLATION ABLATION N/A 06/13/2019   Procedure: ATRIAL FIBRILLATION ABLATION;  Surgeon: Inocencio Soyla Lunger, MD;  Location: [facility];  Service: Cardiovascular;  Laterality: N/A;   ATRIAL FIBRILLATION ABLATION N/A 06/21/2023   Procedure: ATRIAL FIBRILLATION ABLATION;  Surgeon: Inocencio Soyla Lunger, MD;  Location: [facility];  Service: Cardiovascular;  Laterality: N/A;   CARDIAC CATHETERIZATION     CARDIOVERSION N/A 04/01/2019   Procedure: CARDIOVERSION;  Surgeon: Maranda Leim DEL, MD;  Location: [facility] ENDOSCOPY;  Service: Cardiovascular;  Laterality: N/A;   CARDIOVERSION N/A 03/12/2023   Procedure: CARDIOVERSION;  Surgeon: Hobart Powell BRAVO, MD;  Location: [facility];  Service: Cardiovascular;  Laterality: N/A;   CATARACT EXTRACTION W/PHACO Left 09/09/2015   Procedure: CATARACT EXTRACTION PHACO AND INTRAOCULAR LENS PLACEMENT (IOC);   Surgeon: Newell Ovens, MD;  Location: [facility] ORS;  Service: Ophthalmology;  Laterality: Left;  US    1:10.5 AP     8.6 CDE  6.05 casette lot #  [id number]   CATARACT EXTRACTION W/PHACO Right 09/30/2015   Procedure: CATARACT EXTRACTION PHACO AND INTRAOCULAR LENS PLACEMENT (IOC);  Surgeon: Newell Ovens, MD;  Location: [facility] ORS;  Service: Ophthalmology;  Laterality: Right;  US  01:00.3 AP%: 11.6 CDE: 7.00 FLUID LOT# [id number]   COLONOSCOPY  01/02/2007   COLONOSCOPY WITH PROPOFOL  N/A 04/04/2017   Procedure: COLONOSCOPY WITH PROPOFOL ;  Surgeon: Viktoria Lamar DASEN, MD;  Location: [facility] ENDOSCOPY;  Service: Endoscopy;  Laterality: N/A;   ESOPHAGOGASTRODUODENOSCOPY  10/09/2007   TONSILLECTOMY     TOTAL KNEE ARTHROPLASTY Left 10/23/2023   Procedure: TOTAL KNEE ARTHROPLASTY;  Surgeon: Edie Norleen PARAS, MD;  Location: [facility] ORS;  Service: Orthopedics;  Laterality: Left;    Prior to Admission medications   Medication Sig Start Date End Date Taking? Authorizing Provider  acetaminophen  (TYLENOL ) 500 MG tablet Take 1,000 mg by mouth every 6 (six) hours as needed for moderate pain or headache.   Yes [provider]  albuterol  (VENTOLIN  HFA) 108 (90 Base) MCG/ACT inhaler Inhale 1-2 puffs into the lungs every 6 (six) hours as needed (exercise/wheezing/shortness of breath.). 12/24/19  Yes [provider]  amLODipine  (NORVASC ) 10 MG tablet Take 10 mg by mouth daily.   Yes [provider]  apixaban  (ELIQUIS ) 5 MG TABS tablet Take 1 tablet (5 mg total) by mouth 2 (two) times daily. 12/18/18  Yes Noemi Reena CROME, NP  atorvastatin  (LIPITOR) 20 MG tablet TAKE 1 TABLET (20 MG TOTAL) BY MOUTH DAILY AT 6 PM. 03/11/19  Yes End, Lonni, MD  Calcium  Carbonate-Vitamin D (CALCIUM -D PO) Take 2 tablets by mouth daily.   Yes [provider]  Carboxymethylcellul-Glycerin (LUBRICATING EYE DROPS OP) Place 1 drop into both eyes daily as needed (dry eyes).   Yes [provider]  chlorhexidine   (PERIDEX ) 0.12 % solution Use as directed 15 mLs in the mouth or throat 2 (two) times daily. 03/13/24  Yes [provider]  citalopram  (CELEXA ) 20 MG tablet Take 20 mg by mouth in the morning.   Yes [provider]  EPINEPHrine  0.3 mg/0.3 mL IJ SOAJ injection Inject 0.3 mg into the muscle as needed. 04/17/24  Yes [provider]  FASENRA PEN 30 MG/ML prefilled autoinjector Inject 30 mg into the skin every 30 (thirty) days. 06/02/24  Yes [provider]  hydrochlorothiazide  (HYDRODIURIL ) 25 MG tablet Take 25 mg by mouth in the morning. 12/28/18  Yes [provider]  levothyroxine (SYNTHROID) 50 MCG tablet Take 50 mcg by mouth daily at 2 am. 07/10/24  Yes [provider]  loratadine  (CLARITIN ) 10 MG tablet Take 10 mg by mouth daily as needed for allergies.   Yes [provider]  losartan  (COZAAR ) 50 MG tablet Take 50 mg by mouth daily.   Yes [provider]  montelukast (SINGULAIR) 10 MG tablet Take 10 mg by  mouth at bedtime.   Yes [provider]  Multiple Vitamin (MULTIVITAMIN) tablet Take 1 tablet by mouth in the morning.   Yes [provider]  omeprazole (PRILOSEC) 20 MG capsule Take 20 mg by mouth daily before breakfast.   Yes [provider]  predniSONE (DELTASONE) 5 MG tablet Take 5 mg by mouth daily at 2 am. 07/16/24 10/14/24 Yes [provider]  tolterodine (DETROL) 2 MG tablet Take 2 mg by mouth 2 (two) times daily. 02/14/23  Yes [provider]  torsemide  (DEMADEX ) 10 MG tablet Take 1 tablet (10 mg total) by mouth 3 (three) times a week. Patient only takes 3 08/06/24 08/06/25 Yes Hammock, Tylene, NP    Family History  Problem Relation Age of Onset   Breast cancer Maternal Aunt    Hypertension Mother    Heart disease Mother    Heart disease Father    Hypertension Father      Social History   Tobacco Use   Smoking status: Never   Smokeless tobacco: Never   Tobacco comments:     Never smoked 07/19/23  Vaping Use   Vaping status: Never Used  Substance Use Topics   Alcohol  use: Yes    Alcohol /week: 1.0 standard drink of alcohol     Types: 1 Glasses of wine per week    Comment: occasional   Drug use: No    Allergies as of 08/21/2024 - Review Complete 08/21/2024  Allergen Reaction Noted   Imdur  [isosorbide  nitrate] Other (See Comments) 05/02/2021   Isosorbide  dinitrate Other (See Comments) 05/02/2021   Sulfa antibiotics Nausea Only 09/03/2015    Review of Systems:    All systems reviewed and negative except where noted in HPI.   Physical Exam:  Vital signs in last 24 hours: Temp:  [97.7 F (36.5 C)] 97.7 F (36.5 C) (10/09 0954) Pulse Rate:  [60-67] 67 (10/09 0954) Resp:  [18] 18 (10/09 0954) BP: (110-132)/(72-77) 132/77 (10/09 0954) SpO2:  [95 %-100 %] 95 % (10/09 1042) Weight:  [76.9 kg] 76.9 kg (10/09 0915)   General:   Pleasant, cooperative in NAD Head:  Normocephalic and atraumatic. Eyes:   No icterus.   Conjunctiva pink. PERRLA. Ears:  Normal auditory acuity. Neck:  Supple; no masses or thyroidomegaly Lungs: Respirations even and unlabored. Lungs clear to auscultation bilaterally.   No wheezes, crackles, or rhonchi.  Heart:  Regular rate and rhythm;  Without murmur, clicks, rubs or gallops Abdomen:  Soft, nondistended, nontender. Normal bowel sounds. No appreciable masses or hepatomegaly.  No rebound or guarding.  Neurologic:  Alert and oriented x3;  grossly normal neurologically. Skin:  Intact without significant lesions or rashes. Cervical Nodes:  No significant cervical adenopathy. Psych:  Alert and cooperative. Normal affect.  LAB RESULTS: Recent Labs    08/21/24 1025  WBC 9.7  HGB 9.5*  HCT 28.6*  PLT 226   BMET Recent Labs    08/21/24 1025  NA 141  K 3.5  CL 105  CO2 28  GLUCOSE 105*  BUN 56*  CREATININE 1.44*  CALCIUM  9.5   LFT Recent Labs    08/21/24 1025  PROT 6.4*  ALBUMIN 3.9  AST 26  ALT 20  ALKPHOS 29*   BILITOT 0.6   PT/INR Recent Labs    08/21/24 1025  LABPROT 20.2*  INR 1.6*    STUDIES: DG Foot Complete Left Result Date: 08/21/2024 CLINICAL DATA:  Clemens. Pain swelling and bruising of the third and fourth toes. EXAM: LEFT FOOT -  COMPLETE 3+ VIEW COMPARISON:  None Available. FINDINGS: Small corner fracture involving the base of the middle phalanx of the third toe laterally. Small avulsion fracture also noted along the lateral base of the fourth middle phalanx. No other midfoot forefoot fractures are identified. The lateral film demonstrates a small avulsion fracture involving the distal dorsal cortex of the talus. IMPRESSION: 1. Small corner fracture involving the base of the middle phalanx of the third toe laterally. 2. Small avulsion fracture involving the lateral base of the fourth middle phalanx. 3. Small avulsion fracture involving the distal dorsal cortex of the talus. Electronically Signed   By: MYRTIS Stammer M.D.   On: 08/21/2024 11:44   DG Ankle 2 Views Left Result Date: 08/21/2024 CLINICAL DATA:  Pain after fall. EXAM: LEFT ANKLE - 2 VIEW COMPARISON:  None Available. FINDINGS: Punctate linear mineralization adjacent to the dorsal talar head/neck may reflect an avulsion fracture. No dislocation. No additional fracture identified. Soft tissue swelling of the anterolateral ankle. IMPRESSION: Findings suspicious for small linear avulsion fracture of the dorsal talar head/neck. Electronically Signed   By: Harrietta Sherry M.D.   On: 08/21/2024 11:17      Impression / Plan:   Emma Rivera is a 83 y.o. y/o female with a h/o Afibb on eloquis last dose this am presents to the ER with light headness , 3 grams drop in Hb, melena, elevated bun/cr ratio likely upper GI bleed, possible severe esophagitis as she had an episode of severe heart burn last week    Plan  Monitor CBC and transfuse as needed - if has acute severe bleeding get CT angiogram EGD after 2 doses of eloquis have been skipped  ie will plan for Sunday .  Dr Aundria will be covering from tomorrow  No nsaids , IV PPI  5. When she eventually gets discharged will need to be on long term PPI   I have discussed alternative options, risks & benefits,  which include, but are not limited to, bleeding, infection, perforation,respiratory complication & drug reaction.  The patient agrees with this plan & written consent will be obtained.    Thank you for involving me in the care of this patient.      LOS: 0 days   Ruel Kung, MD  08/21/2024, 12:37 PM

## 2024-08-21 NOTE — ED Provider Notes (Signed)
 DG Foot Complete Left Result Date: 08/21/2024 CLINICAL DATA:  Clemens. Pain swelling and bruising of the third and fourth toes. EXAM: LEFT FOOT - COMPLETE 3+ VIEW COMPARISON:  None Available. FINDINGS: Small corner fracture involving the base of the middle phalanx of the third toe laterally. Small avulsion fracture also noted along the lateral base of the fourth middle phalanx. No other midfoot forefoot fractures are identified. The lateral film demonstrates a small avulsion fracture involving the distal dorsal cortex of the talus. IMPRESSION: 1. Small corner fracture involving the base of the middle phalanx of the third toe laterally. 2. Small avulsion fracture involving the lateral base of the fourth middle phalanx. 3. Small avulsion fracture involving the distal dorsal cortex of the talus. Electronically Signed   By: MYRTIS Stammer M.D.   On: 08/21/2024 11:44   2 small fractures of the 3rd and 4th toe phalanx.   Dicky Anes, MD 08/21/24 [phone]

## 2024-08-21 NOTE — Plan of Care (Signed)

## 2024-08-21 NOTE — TOC CM/SW Note (Signed)
 Transition of Care [facility]) - Inpatient Brief Assessment   Patient Details  Name: Emma Rivera MRN: [id number] Date of Birth: Mar 29, 1941  Transition of Care [facility]) CM/SW Contact:    Corean ONEIDA Haddock, RN Phone Number: 08/21/2024, 4:25 PM   Clinical Narrative:  Transition of Care [facility]) Screening Note   Patient Details  Name: Emma Rivera Date of Birth: February 14, 1941   Transition of Care [facility]) CM/SW Contact:    Corean ONEIDA Haddock, RN Phone Number: 08/21/2024, 4:25 PM    Transition of Care Department [facility]) has reviewed patient and no TOC needs have been identified at this time. . If new patient transition needs arise, please place a TOC consult.     Transition of Care Asessment: Insurance and Status: Insurance coverage has been reviewed Patient has primary care physician: Yes     Prior/Current Home Services: No current home services Social Drivers of Health Review: SDOH reviewed no interventions necessary Readmission risk has been reviewed: Yes Transition of care needs: no transition of care needs at this time

## 2024-08-21 NOTE — H&P (Signed)
 History and Physical    Emma Rivera FMW:[id number] DOB: 10/30/1941 DOA: 08/21/2024  DOS: the patient was seen and examined on 08/21/2024  PCP: Alla Amis, MD   Patient coming from: Home  I have personally briefly reviewed patient's old medical records in [facility]  Chief Complaint: Lightheadedness/dizziness  HPI: Emma Rivera is a pleasant 83 y.o. female with medical history significant for A-fib on Eliquis , history of stroke, HTN, HLD, GERD, depression, hypothyroidism who came into ED from cardiologist office after she was found to have orthostatic hypotension.  Patient stated that she was lightheaded and dizzy yesterday when she was in [place]  at her daughter's house.  She called cardiologist office to make appointment.  If she went to see them today and they found her blood pressure was low when she sit up and stand up.  Patient stated that she has been having black stool for the last 3 to 4 days.  She has been on Eliquis .  She denies any nausea vomiting, hematemesis.  She denies any chest pain, palpitations, shortness of breath.  However, she stated that she was short of breath on exertion.  She also stated that she had a fall yesterday in [place]  but she did not seek any help.  ED Course: Upon arrival to the ED, patient is found to have normal vital signs.  Patient had dark stool.  Hemoglobin was 9.5 and hematocrit was 28.8 lower than her baseline.  BUN was 56 and creatinine 1.44.  INR was 1.6.  Her x-ray of the foot showed left 3rd and 4th toe fracture along with a talus fracture.  Hospitalist service was consulted for evaluation for admission for GI bleeding in the setting of Eliquis  use and left foot fracture.  Review of Systems:  ROS  All other systems negative except as noted in the HPI.  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   a.) CTA head/neck showed emergent M2 occlusion; CT head negative --> neurology consulted  and recommended TPA and transfer to [facility] for possible thrombectomy; b.) repeat CT at [facility] revealed interval resolution of M2 occlusion --> admitted; c.) MRI brain 12/17/2018: acute LEFT MCA infarct with 3 punctate areas of restricted diffusion (largest area involved the LEFT insular cortex)   Atrial fibrillation (HCC) 12/16/2018   a.) new onset 12/16/2018; b.) CHA2DS2-VASc = 6 (age x2, sex, HTN, CVA x2) as of 10/19/2023; c.) s/p DCCV (120 J x 1) with ERAF 04/01/2019; d.) s/p ablation 06/13/2019; e.) s/p DCCV (150 J x1) 03/12/2023; f.) s/p repeat ablation 06/21/2023; g.) cardiac rate/rhythm currently maintained instrinsically without pharmacological intervention; chronically anticoagulated using standard dose apixaban    Baker's cyst of knee, left    Cerebral microvascular disease    DDD (degenerative disc disease), cervical    DDD (degenerative disc disease), lumbar    Depression    Diastolic dysfunction 03/23/2023   a.) TTE 03/23/2023: EF 60-65%, no RWMAs, G2DD, RVSF/RVSP normal, mod LAE, mild RAE, AoV sclerosis with no stenosis   Diverticulosis    Esophageal spasm    Essential hypertension    GERD (gastroesophageal reflux disease)    Heart murmur    History of bilateral cataract extraction 2016   History of kidney stones    History of stroke involving cerebellum    a.) date of neurovascular event unknown; infarct of the posterior inferior LEFT cerebellum noted on MRI brain done on 12/17/2018 performed in setting of acute LEFT MCA infarct  HOH (hard of hearing)    Hyperlipidemia    Moderate persistent asthma    NSVT (nonsustained ventricular tachycardia) (HCC) 01/21/2019   a.) Zio 01/21/2019: 2 runs NSVT lasting up to 18 beats at max rate of 174 bpm;  up to 4.6 second post-termination pauses   On apixaban  therapy    Osteoarthritis    Prediabetes    PSVT (paroxysmal supraventricular tachycardia) 10/09/2023   a.) Zio 10/09/2023: 37 runs each lasting < 17 seconds (max rate 210 bpm)     Past Surgical History:  Procedure Laterality Date   ATRIAL FIBRILLATION ABLATION N/A 06/13/2019   Procedure: ATRIAL FIBRILLATION ABLATION;  Surgeon: Inocencio Soyla Lunger, MD;  Location: [facility] INVASIVE CV LAB;  Service: Cardiovascular;  Laterality: N/A;   ATRIAL FIBRILLATION ABLATION N/A 06/21/2023   Procedure: ATRIAL FIBRILLATION ABLATION;  Surgeon: Inocencio Soyla Lunger, MD;  Location: [facility] INVASIVE CV LAB;  Service: Cardiovascular;  Laterality: N/A;   CARDIAC CATHETERIZATION     CARDIOVERSION N/A 04/01/2019   Procedure: CARDIOVERSION;  Surgeon: Maranda Leim DEL, MD;  Location: [facility] ENDOSCOPY;  Service: Cardiovascular;  Laterality: N/A;   CARDIOVERSION N/A 03/12/2023   Procedure: CARDIOVERSION;  Surgeon: Hobart Powell BRAVO, MD;  Location: [facility] INVASIVE CV LAB;  Service: Cardiovascular;  Laterality: N/A;   CATARACT EXTRACTION W/PHACO Left 09/09/2015   Procedure: CATARACT EXTRACTION PHACO AND INTRAOCULAR LENS PLACEMENT (IOC);  Surgeon: Newell Ovens, MD;  Location: [facility] ORS;  Service: Ophthalmology;  Laterality: Left;  US    1:10.5 AP     8.6 CDE  6.05 casette lot #  [id number]   CATARACT EXTRACTION W/PHACO Right 09/30/2015   Procedure: CATARACT EXTRACTION PHACO AND INTRAOCULAR LENS PLACEMENT (IOC);  Surgeon: Newell Ovens, MD;  Location: [facility] ORS;  Service: Ophthalmology;  Laterality: Right;  US  01:00.3 AP%: 11.6 CDE: 7.00 FLUID LOT# [id number]   COLONOSCOPY  01/02/2007   COLONOSCOPY WITH PROPOFOL  N/A 04/04/2017   Procedure: COLONOSCOPY WITH PROPOFOL ;  Surgeon: Viktoria Lamar DASEN, MD;  Location: [facility] ENDOSCOPY;  Service: Endoscopy;  Laterality: N/A;   ESOPHAGOGASTRODUODENOSCOPY  10/09/2007   TONSILLECTOMY     TOTAL KNEE ARTHROPLASTY Left 10/23/2023   Procedure: TOTAL KNEE ARTHROPLASTY;  Surgeon: Edie Norleen PARAS, MD;  Location: [facility] ORS;  Service: Orthopedics;  Laterality: Left;     reports that she has never smoked. She has never used smokeless tobacco. She reports current alcohol  use of  about 1.0 standard drink of alcohol  per week. She reports that she does not use drugs.  Allergies  Allergen Reactions   Imdur  [Isosorbide  Nitrate] Other (See Comments)    Hair loss   Isosorbide  Dinitrate Other (See Comments)    HA with imdur     Hair loss   Sulfa Antibiotics Nausea Only    Family History  Problem Relation Age of Onset   Breast cancer Maternal Aunt    Hypertension Mother    Heart disease Mother    Heart disease Father    Hypertension Father     Prior to Admission medications   Medication Sig Start Date End Date Taking? Authorizing Provider  acetaminophen  (TYLENOL ) 500 MG tablet Take 1,000 mg by mouth every 6 (six) hours as needed for moderate pain or headache.    [provider]  albuterol  (VENTOLIN  HFA) 108 (90 Base) MCG/ACT inhaler Inhale 1-2 puffs into the lungs every 6 (six) hours as needed (exercise/wheezing/shortness of breath.). 12/24/19   [provider]  amLODipine  (NORVASC ) 10 MG tablet Take 10 mg by mouth daily.    [provider]  apixaban  (ELIQUIS ) 5 MG TABS tablet Take 1 tablet (5 mg total) by mouth 2 (two) times daily. 12/18/18   Noemi Reena CROME, NP  atorvastatin  (LIPITOR) 20 MG tablet TAKE 1 TABLET (20 MG TOTAL) BY MOUTH DAILY AT 6 PM. 03/11/19   End, Lonni, MD  Calcium  Carbonate-Vitamin D (CALCIUM -D PO) Take 2 tablets by mouth daily.    [provider]  Carboxymethylcellul-Glycerin (LUBRICATING EYE DROPS OP) Place 1 drop into both eyes daily as needed (dry eyes).    [provider]  citalopram  (CELEXA ) 20 MG tablet Take 20 mg by mouth in the morning.    [provider]  hydrochlorothiazide  (HYDRODIURIL ) 25 MG tablet Take 25 mg by mouth in the morning. 12/28/18   [provider]  levothyroxine (SYNTHROID) 50 MCG tablet Take 50 mcg by mouth daily at 2 am. 07/10/24   [provider]  loratadine  (CLARITIN ) 10 MG tablet Take 10 mg by mouth daily as needed for allergies.    [provider]  losartan  (COZAAR ) 50 MG tablet Take 50 mg by mouth daily.    [provider]  Multiple Vitamin (MULTIVITAMIN) tablet Take 1 tablet by mouth in the morning.    [provider]  omeprazole (PRILOSEC) 20 MG capsule Take 20 mg by mouth daily before breakfast.    [provider]  predniSONE (DELTASONE) 5 MG tablet Take 5 mg by mouth daily at 2 am. 07/16/24 10/14/24  [provider]  tolterodine (DETROL) 2 MG tablet Take 2 mg by mouth 2 (two) times daily. 02/14/23   [provider]  torsemide  (DEMADEX ) 10 MG tablet Take 1 tablet (10 mg total) by mouth 3 (three) times a week. Patient only takes 3 08/06/24 08/06/25  Gerard Frederick, NP    Physical Exam: Vitals:   08/21/24 0954 08/21/24 1042 08/21/24 1247 08/21/24 1255  BP: 132/77  (!) 145/53   Pulse: 67  61   Resp: 18  16   Temp: 97.7 F (36.5 C)  97.7 F (36.5 C)   TempSrc: Oral     SpO2: 95% 95% 98%   Weight:    78 kg  Height:    5' 2 (1.575 m)    Physical Exam   Constitutional: Alert, awake, calm, comfortable HEENT: Neck supple Respiratory: Clear to auscultation B/L, no wheezing, no rales.  Cardiovascular: Regular rate and rhythm, no murmurs / rubs / gallops. No extremity edema. 2+ pedal pulses. No carotid bruits.  Abdomen: Soft, no tenderness, Bowel sounds positive.  Musculoskeletal: Left foot has a splint Skin: no rashes, lesions, ulcers. Neurologic: CN 2-12 grossly intact. Sensation intact, No focal deficit identified Psychiatric: Alert and oriented x 3. Normal mood.    Labs on Admission: I have personally reviewed following labs and imaging studies  CBC: Recent Labs  Lab 08/21/24 1025  WBC 9.7  HGB 9.5*  HCT 28.6*  MCV 96.3  PLT 226   Basic Metabolic Panel: Recent Labs  Lab 08/21/24 1025  NA 141  K 3.5  CL 105  CO2 28  GLUCOSE 105*  BUN 56*  CREATININE 1.44*  CALCIUM  9.5   GFR: Estimated Creatinine Clearance: 29.1 mL/min (A) (by C-G formula based on SCr  of 1.44 mg/dL (H)). Liver Function Tests: Recent Labs  Lab 08/21/24 1025  AST 26  ALT 20  ALKPHOS 29*  BILITOT 0.6  PROT 6.4*  ALBUMIN 3.9   No results for input(s): LIPASE, AMYLASE in the last 168 hours. No results for input(s): AMMONIA in  the last 168 hours. Coagulation Profile: Recent Labs  Lab 08/21/24 1025  INR 1.6*   Cardiac Enzymes: No results for input(s): CKTOTAL, CKMB, CKMBINDEX, TROPONINI, TROPONINIHS in the last 168 hours. BNP (last 3 results) Recent Labs    10/31/23 0455 02/21/24 1716 08/05/24 1103  BNP 284.7* 199.1* 135.7*   HbA1C: No results for input(s): HGBA1C in the last 72 hours. CBG: No results for input(s): GLUCAP in the last 168 hours. Lipid Profile: No results for input(s): CHOL, HDL, LDLCALC, TRIG, CHOLHDL, LDLDIRECT in the last 72 hours. Thyroid  Function Tests: No results for input(s): TSH, T4TOTAL, FREET4, T3FREE, THYROIDAB in the last 72 hours. Anemia Panel: No results for input(s): VITAMINB12, FOLATE, FERRITIN, TIBC, IRON, RETICCTPCT in the last 72 hours. Urine analysis:    Component Value Date/Time   COLORURINE YELLOW (A) 10/30/2023 1440   APPEARANCEUR CLEAR (A) 10/30/2023 1440   LABSPEC 1.011 10/30/2023 1440   PHURINE 8.0 10/30/2023 1440   GLUCOSEU NEGATIVE 10/30/2023 1440   HGBUR SMALL (A) 10/30/2023 1440   BILIRUBINUR NEGATIVE 10/30/2023 1440   KETONESUR NEGATIVE 10/30/2023 1440   PROTEINUR NEGATIVE 10/30/2023 1440   NITRITE NEGATIVE 10/30/2023 1440   LEUKOCYTESUR SMALL (A) 10/30/2023 1440    Radiological Exams on Admission: I have personally reviewed images DG Foot Complete Left Result Date: 08/21/2024 CLINICAL DATA:  Clemens. Pain swelling and bruising of the third and fourth toes. EXAM: LEFT FOOT - COMPLETE 3+ VIEW COMPARISON:  None Available. FINDINGS: Small corner fracture involving the base of the middle phalanx of the third toe laterally. Small avulsion fracture also  noted along the lateral base of the fourth middle phalanx. No other midfoot forefoot fractures are identified. The lateral film demonstrates a small avulsion fracture involving the distal dorsal cortex of the talus. IMPRESSION: 1. Small corner fracture involving the base of the middle phalanx of the third toe laterally. 2. Small avulsion fracture involving the lateral base of the fourth middle phalanx. 3. Small avulsion fracture involving the distal dorsal cortex of the talus. Electronically Signed   By: MYRTIS Stammer M.D.   On: 08/21/2024 11:44   DG Ankle 2 Views Left Result Date: 08/21/2024 CLINICAL DATA:  Pain after fall. EXAM: LEFT ANKLE - 2 VIEW COMPARISON:  None Available. FINDINGS: Punctate linear mineralization adjacent to the dorsal talar head/neck may reflect an avulsion fracture. No dislocation. No additional fracture identified. Soft tissue swelling of the anterolateral ankle. IMPRESSION: Findings suspicious for small linear avulsion fracture of the dorsal talar head/neck. Electronically Signed   By: Harrietta Sherry M.D.   On: 08/21/2024 11:17    EKG: My personal interpretation of EKG shows: Normal sinus rhythm at 70 bpm    Assessment/Plan Principal Problem:   GI bleeding Active Problems:   Atrial fibrillation with RVR (HCC)   Essential hypertension   CVA (cerebral vascular accident) (HCC) - L frontal embolic d/t AF s/p IV TPA   Hyperlipidemia LDL goal <70   Dyspnea on exertion   GERD without esophagitis    Assessment and Plan: 83 year old female with history of old stroke, A-fib on Eliquis , HTN, HLD, GERD who came in from cardiologist office after she was found to have orthostatic hypotension when she was that for evaluation of dizziness.  Patient also had a fall yesterday.  1.  Dizziness/fall/orthostatic hypotension - It appears that this this was related to GI bleed. -She had black tarry stool for the last few days - She is on Eliquis  which will be held - Her hemoglobin  hematocrit  is low - She will be n.p.o., IV fluid, Protonix  and GI evaluation. - Will continue to monitor hemoglobin and hematocrit.  2.  GI bleeding - She has Eliquis  as mentioned above. - She is on a small dose of prednisone at home, which will be held at this point - She has been on IV fluid, n.p.o., Protonix  and GI has been called. - Will continue to monitor hemoglobin hematocrit as mentioned above  3.  Left foot fracture - She has been on splint - I will call podiatry for evaluation.  4.  HTN/history of stroke/HLD - Resume home medications amlodipine , losartan  and statin - Hold anticoagulation and diuretics torsemide  and hydrochlorothiazide   5.  History of GERD - Patient is to take omeprazole which will be replaced by Protonix   6.  A-fib - Stable - Does not have any nodal blocker at home - Hold off Eliquis  for now      DVT prophylaxis: SCDs Code Status: DNR/DNI(Do NOT Intubate) Family Communication: Husband was at bedside Disposition Plan: Home Consults called: Gastroenterology Admission status: Inpatient, Telemetry bed   Nena Rebel, MD [facility] 08/21/2024, 12:56 PM

## 2024-08-21 NOTE — ED Triage Notes (Signed)
 Pt reports dizziness for 2 days. Along with dizziness, comes SOB and hypotension. Pt on eliquis  and dark stools for this week. Pt fell yesterday when going to the bathroom and pain to left foot.

## 2024-08-21 NOTE — Progress Notes (Signed)
 Cardiology Office Note   Date:  08/21/2024  ID:  Emma Rivera, DOB [birth year], MRN [id number] PCP: Alla Amis, MD  [facility] Providers Cardiologist:  Lonni Hanson, MD Electrophysiologist:  Soyla Gladis Norton, MD     History of Present Illness Emma Rivera is a 83 y.o. female with a past medical history of stroke (12/2018) subsequent diagnosis paroxysmal atrial fibrillation status post ablation (05/2019), COVID-19 infection (11/2020), hypertension, GERD, esophageal spasm, obstructive sleep apnea, who presents today with complaints of dizziness.   Status post A-fib ablation 06/02/2019 and 07/03/2023 for both completed by Dr. Norton.  She was admitted 12/17-19/2024 with atrial fibrillation with RVR.  Amiodarone  was started during her hospitalization.  Ventricular rates were well-controlled at discharge but she remained in atrial fibrillation.  She was last seen in clinic 11/19/2023 and converted to sinus rhythm approximately 3 days prior.  She wears a Fitbit and also has a cardia mobile.  Her pulse was previously 80-90 bpm and dropped 3 days ago into the 50s.  She continues to take amiodarone  200 mg daily and apixaban  5 mg twice daily.  She continued to be noncompliant with CPAP but was advised to follow-up with pulmonary.   She was seen in clinic 12/12/2023, heart failure Concerned about elevated blood pressure.  Blood pressures tend to be 181/92 at home.  She has subsequently restarted 5 mg of amlodipine  that was discontinued during recent hospitalization.  Her losartan  dose was also cut in half from 100 mg to 50 mg daily.  She had followed up with orthopedic provider and an ultrasound was done of her leg that was negative for DVT.  Blood pressure was still optimally controlled at 158/66 156/70 on return.  Her losartan  was increased to 100 mg daily.  She was encouraged to continue to monitor her pressure 1 to 2 hours postmedication administration to evaluate effectiveness of her current  medication after adjusting dose of losartan .  She was seen in clinic 05/05/2024 for routine EP follow-up.  Denied any breakthrough atrial fibrillation.  EKG revealed sinus bradycardia.  She was continued on apixaban  5 mg twice daily for CHA2DS2-VASc of at least 6 and amiodarone  200 mg daily.  Blood pressure continued to be elevated.   She was last seen in clinic 08/05/2024 with several issues.  She had progressive worsening of shortness of breath over the last several weeks.  She been using her inhaler to prevent bronchospasms.  She had followed up with pulmonary and had several lab studies completed that have been unrevealing.  She had also been started on torsemide  where she had stated she had lost 6 pounds in 1 day and felt horrible so she had stopped taking the medication.  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  She was scheduled for an updated echocardiogram amiodarone  was discontinued.  She was also sent for updated labs.  She returns to clinic today accompanied by her husband.  She states that she had to go to her daughter's house in [place]  over the weekend and she has been extremely dizzy to the point that she has fallen in her left ankle is bruised and swollen even into the toes and she is worried that she may have broken her ankle or possibly her foot.  She had noted that she was suffering from a large amount of constipation and took an over-the-counter medication.  At that point she noted  that she was having a large quantity of dark tarry stools and some occasional bright red blood in her stool.  She had stopped taking her diuretic because she had become lightheaded and with a drop in her blood pressure that she had noted she had stopped taking her blood pressure medication.  She was so dizzy that she was having to walk a couple steps and just sit down.  The black tarry stools  continued.  She also noted some lower abdominal discomfort.  ROS: 10 point review of system has been reviewed and considered negative the exception was been listed in HPI  Studies Reviewed EKG Interpretation Date/Time:  Thursday August 21 2024 09:22:29 EDT Ventricular Rate:  60 PR Interval:  150 QRS Duration:  82 QT Interval:  470 QTC Calculation: 470 R Axis:   50  Text Interpretation: Normal sinus rhythm Normal ECG When compared with ECG of 05-Aug-2024 10:19, No significant change was found Confirmed by Gerard Frederick ([id number]) on 08/21/2024 9:24:56 AM    Long term monitor, 10/09/2023 Patient had a min HR of 51 bpm, max HR of 210 bpm, and avg HR of 64bpm.  Predominant underlying rhythm was Sinus Rhythm.  37 Supraventricular Tachycardia runs occurred, all less than 17 beats  3.3% supraventricular ectopy Less than 1% ventricular ectopy No patient triggered episodes recorded   Long term monitor, 04/02/2023 Predominant rhythm was sinus rhythm Less than 1% ventricular and supraventricular ectopy burden No atrial fibrillation noted Patient triggered episodes associated with sinus rhythm   TTE, 03/23/2023  1. Left ventricular ejection fraction, by estimation, is 60 to 65%. The left ventricle has normal function. The left ventricle has no regional wall motion abnormalities. Left ventricular diastolic parameters are consistent with Grade II diastolic dysfunction (pseudonormalization).   2. Right ventricular systolic function is normal. The right ventricular size is normal. There is normal pulmonary artery systolic pressure.   3. Left atrial size was moderately dilated.   4. Right atrial size was mildly dilated.   5. The mitral valve is normal in structure. No evidence of mitral valve regurgitation. No evidence of mitral stenosis.   6. The aortic valve is normal in structure. Aortic valve regurgitation is not visualized. Aortic valve sclerosis/calcification is present, without any evidence of  aortic stenosis.   7. The inferior vena cava is normal in size with greater than 50% respiratory variability, suggesting right atrial pressure of 3 mmHg.    Risk Assessment/Calculations  CHA2DS2-VASc Score = 6   This indicates a 9.7% annual risk of stroke. The patient's score is based upon: CHF History: 0 HTN History: 1 Diabetes History: 0 Stroke History: 2 Vascular Disease History: 0 Age Score: 2 Gender Score: 1            Physical Exam VS:  BP 110/72   Pulse 60   Ht 5' 2 (1.575 m)   Wt 169 lb 9.6 oz (76.9 kg)   SpO2 100%   BMI 31.02 kg/m   Orthostatic VS for the past 24 hrs (Last 3 readings):  BP- Lying Pulse- Lying BP- Sitting Pulse- Sitting BP- Standing at 0 minutes Pulse- Standing at 0 minutes  08/21/24 0927 133/76 78 105/70 78 (!) 86/54 78      Wt Readings from Last 3 Encounters:  08/21/24 169 lb 9.6 oz (76.9 kg)  08/05/24 177 lb 12.8 oz (80.6 kg)  05/05/24 174 lb 6.4 oz (79.1 kg)    GEN: Well nourished, well developed in no acute distress NECK: No JVD; No  carotid bruits CARDIAC: RRR, no murmurs, rubs, gallops RESPIRATORY:  Clear to auscultation without rales, wheezing or rhonchi  ABDOMEN: Soft, non-tender, non-distended EXTREMITIES: Trace pretibial edema; large quantity of bruising no deformity   ASSESSMENT AND PLAN Dizziness and lightheadedness status post mechanical fall with significant orthostatic hypotension with blood pressure dropping from 133-86 systolic.  Patient has held her diuretic therapy and her blood pressure medication.  Black tarry stools on blood thinning medication.  Patient stated that she had taken over-the-counter medication for constipation and has been having black tarry stools throughout the weekend with worsening dizziness lightheadedness and drops in her blood pressure.  With black tarry stools and lower abdominal pain and orthostatic hypotension concern for GI bleed patient is being sent to the emergency department for further  evaluation.  Worsening progressive shortness of breath and dyspnea on exertion.  Recently followed up with pulmonary and had normal PFTs, previous improvement with prednisone.  Being worked up for interstitial lung disease or pulmonary toxicity due to amiodarone .  At her last visit amiodarone  was discontinued.  Primary hypertension with today significant orthostatic hypotension.  Blood pressure medications have been placed on hold until further evaluation in the emergency department has been completed.  Persistent atrial fibrillation status post ablation with sinus rhythm with a rate of 60 with no ischemic changes noted on EKG today.  With black tarry stools and red blood noted she has been advised to hold her apixaban  at this time for CHA2DS2-VASc score of 7 until further evaluation.  Obstructive sleep apnea where she does continue to be compliant with her CPAP.  Ongoing management per pulmonary.  Sinus bradycardia which is slowly improving with discontinuation of amiodarone .  Hypothyroidism continued on levothyroxine with ongoing management per endocrinology.       Dispo: Patient to return to clinic to see MD/APP after evaluation in the emergency department is complete in 2 to 3 weeks.  Or sooner if needed  Signed, Kambryn Dapolito, NP

## 2024-08-21 NOTE — ED Provider Notes (Signed)
 [facility] Provider Note    Event Date/Time   First MD Initiated Contact with Patient 08/21/24 1052     (approximate)   History   Dizziness and Fall   HPI  Emma Rivera is a 83 y.o. female history of A-fib on Eliquis   Patient noticed on Monday that she started having dark stool.  A couple days later started to have fatigue and lightheadedness and when she gets up quickly or moves about her walk she will start to feel lightheaded as though she is going to faint.  Last night she was walking and she started to feel faint again, she did not fall and strike her head but she fell onto her left leg and since then has had some pain in the left ankle laterally and slight bruising of the 3rd and 4th toes.  She feels like she just bruised things up and been able to bear weight on the leg and ankle since, but is concerned that she see continues to feel lightheaded.  Today she also noticed a little bit of blood in her stool, and her stool continues to be dark black  She does take Eliquis  last dose this morning  No shortness of breath or chest pain.  Did not strike her head no headache.  No nausea or vomiting no abdominal pain no fevers.       Physical Exam   Triage Vital Signs: ED Triage Vitals [08/21/24 0954]  Encounter Vitals Group     BP 132/77     Girls Systolic BP Percentile      Girls Diastolic BP Percentile      Boys Systolic BP Percentile      Boys Diastolic BP Percentile      Pulse Rate 67     Resp 18     Temp 97.7 F (36.5 C)     Temp Source Oral     SpO2 95 %     Weight      Height      Head Circumference      Peak Flow      Pain Score 0     Pain Loc      Pain Education      Exclude from Growth Chart     Most recent vital signs: Vitals:   08/21/24 0954 08/21/24 1042  BP: 132/77   Pulse: 67   Resp: 18   Temp: 97.7 F (36.5 C)   SpO2: 95% 95%     General: Awake, no distress.  CV:  Good peripheral perfusion.  Normal tones and  rate Resp:  Normal effort.  Clear bilateral Abd:  No distention.  Soft nontender nondistended throughout Other:  Lower extremities atraumatic with exception to the left ankle laterally slight edema and tenderness but no deformity.  Normal sensation movement of the feet and StyptStix.  She also has bruising to the base of the 3rd and 4th toes of the left foot but no deformity.  She reports she feels like her sore, but is able to wiggle them and there is no obvious deformity but certainly suspicion for a potential fracture given the amount of ecchymosis.  All toes warm and well-perfused   ED Results / Procedures / Treatments   Labs (all labs ordered are listed, but only abnormal results are displayed) Labs Reviewed  COMPREHENSIVE METABOLIC PANEL WITH GFR - Abnormal; Notable for the following components:      Result Value   Glucose, Bld 105 (*)  BUN 56 (*)    Creatinine, Ser 1.44 (*)    Total Protein 6.4 (*)    Alkaline Phosphatase 29 (*)    GFR, Estimated 36 (*)    All other components within normal limits  CBC - Abnormal; Notable for the following components:   RBC 2.97 (*)    Hemoglobin 9.5 (*)    HCT 28.6 (*)    All other components within normal limits  PROTIME-INR - Abnormal; Notable for the following components:   Prothrombin Time 20.2 (*)    INR 1.6 (*)    All other components within normal limits  POC OCCULT BLOOD, ED  TYPE AND SCREEN   Labs notable for mild AKI.  Additionally she has a 3 point drop in her hemoglobin from previous check which was just towards the end of September.  EKG  And interpreted by me at 10 AM heart rate 70 QRS 80 QTc 450 Normal sinus rhythm no evidence ischemia   RADIOLOGY  X-ray of the left ankle initially inter by me as no obvious fracture.  Radiologist notes a small talar fracture  DG Ankle 2 Views Left Result Date: 08/21/2024 CLINICAL DATA:  Pain after fall. EXAM: LEFT ANKLE - 2 VIEW COMPARISON:  None Available. FINDINGS: Punctate  linear mineralization adjacent to the dorsal talar head/neck may reflect an avulsion fracture. No dislocation. No additional fracture identified. Soft tissue swelling of the anterolateral ankle. IMPRESSION: Findings suspicious for small linear avulsion fracture of the dorsal talar head/neck. Electronically Signed   By: Harrietta Sherry M.D.   On: 08/21/2024 11:17   X-ray left foot pending at time of admission decision.  Dr. Granville aware   PROCEDURES:  Critical Care performed: No  Procedures   MEDICATIONS ORDERED IN ED: Medications  sodium chloride  0.9 % bolus 500 mL (has no administration in time range)  pantoprazole  (PROTONIX ) injection 40 mg (has no administration in time range)     IMPRESSION / MDM / ASSESSMENT AND PLAN / ED COURSE  I reviewed the triage vital signs and the nursing notes.                              Differential diagnosis includes, but is not limited to, symptomatic GI bleeding with associated lightheadedness dark stools.  She is on anticoagulant last dose this morning.  Hemodynamically stable but 3 point drop in hemoglobin.  No associated abdominal pain.  Painless GI bleeding.  No fever no elevated white count.  And concern that she probably has symptomatic GI bleeding and will require admission.  She would benefit from GI consultation.  Discussed with patient and she is agreeable with admission plan.  Additionally, consult with patient accepted to hospitalist service by Dr. Granville  Patient's presentation is most consistent with acute complicated illness / injury requiring diagnostic workup.    Slight increase in creatinine from baseline.  Reports slight decrease in appetite the last day or 2.  Will give small fluid bolus and hydration.  Patient admitted to hospitalist service anticipate further serial hemoglobin monitoring, observation for bleeding, anticipate GI consult.  Patient placed into short leg boot prefab for treatment of an immobilization of findings  suspicious for talar injury      FINAL CLINICAL IMPRESSION(S) / ED DIAGNOSES   Final diagnoses:  Gastrointestinal hemorrhage, unspecified gastrointestinal hemorrhage type  Weakness  Acute left ankle pain  Left foot pain  AKI (acute kidney injury)     Rx /  DC Orders   ED Discharge Orders     None        Note:  This document was prepared using Dragon voice recognition software and may include unintentional dictation errors.   Dicky Anes, MD 08/21/24 1134

## 2024-08-21 NOTE — Patient Instructions (Signed)
 SABRA

## 2024-08-22 DIAGNOSIS — D62 Acute posthemorrhagic anemia: Secondary | ICD-10-CM | POA: Diagnosis not present

## 2024-08-22 DIAGNOSIS — E785 Hyperlipidemia, unspecified: Secondary | ICD-10-CM

## 2024-08-22 DIAGNOSIS — I480 Paroxysmal atrial fibrillation: Secondary | ICD-10-CM

## 2024-08-22 DIAGNOSIS — K921 Melena: Secondary | ICD-10-CM

## 2024-08-22 DIAGNOSIS — Z8673 Personal history of transient ischemic attack (TIA), and cerebral infarction without residual deficits: Secondary | ICD-10-CM

## 2024-08-22 DIAGNOSIS — I6300 Cerebral infarction due to thrombosis of unspecified precerebral artery: Secondary | ICD-10-CM

## 2024-08-22 DIAGNOSIS — S92902A Unspecified fracture of left foot, initial encounter for closed fracture: Secondary | ICD-10-CM | POA: Diagnosis not present

## 2024-08-22 DIAGNOSIS — K219 Gastro-esophageal reflux disease without esophagitis: Secondary | ICD-10-CM

## 2024-08-22 DIAGNOSIS — I10 Essential (primary) hypertension: Secondary | ICD-10-CM

## 2024-08-22 LAB — CBC
HCT: 21.9 % — ABNORMAL LOW (ref 36.0–46.0)
Hemoglobin: 7.2 g/dL — ABNORMAL LOW (ref 12.0–15.0)
MCH: 32.9 pg (ref 26.0–34.0)
MCHC: 32.9 g/dL (ref 30.0–36.0)
MCV: 100 fL (ref 80.0–100.0)
Platelets: 140 K/uL — ABNORMAL LOW (ref 150–400)
RBC: 2.19 MIL/uL — ABNORMAL LOW (ref 3.87–5.11)
RDW: 14.6 % (ref 11.5–15.5)
WBC: 4.4 K/uL (ref 4.0–10.5)
nRBC: 0 % (ref 0.0–0.2)

## 2024-08-22 LAB — PROTIME-INR
INR: 1.2 (ref 0.8–1.2)
Prothrombin Time: 16.3 s — ABNORMAL HIGH (ref 11.4–15.2)

## 2024-08-22 LAB — BASIC METABOLIC PANEL WITH GFR
Anion gap: 3 — ABNORMAL LOW (ref 5–15)
BUN: 32 mg/dL — ABNORMAL HIGH (ref 8–23)
CO2: 28 mmol/L (ref 22–32)
Calcium: 7.9 mg/dL — ABNORMAL LOW (ref 8.9–10.3)
Chloride: 114 mmol/L — ABNORMAL HIGH (ref 98–111)
Creatinine, Ser: 1.14 mg/dL — ABNORMAL HIGH (ref 0.44–1.00)
GFR, Estimated: 48 mL/min — ABNORMAL LOW (ref >60)
Glucose, Bld: 95 mg/dL (ref 70–99)
Potassium: 3.7 mmol/L (ref 3.5–5.1)
Sodium: 145 mmol/L (ref 135–145)

## 2024-08-22 LAB — ABO/RH: ABO/RH(D): A POS

## 2024-08-22 LAB — PREPARE RBC (CROSSMATCH)

## 2024-08-22 MED ORDER — LOSARTAN POTASSIUM 50 MG PO TABS
50.0000 mg | ORAL_TABLET | Freq: Every day | ORAL | Status: DC
  Administered 2024-08-23 – 2024-08-26 (×3): 50 mg via ORAL
  Filled 2024-08-22 (×3): qty 1

## 2024-08-22 MED ORDER — SODIUM CHLORIDE 0.9% IV SOLUTION: Freq: Once | INTRAVENOUS | Status: AC

## 2024-08-22 MED ORDER — AMLODIPINE BESYLATE 10 MG PO TABS
10.0000 mg | ORAL_TABLET | Freq: Every day | ORAL | Status: DC
  Administered 2024-08-23 – 2024-08-26 (×3): 10 mg via ORAL
  Filled 2024-08-22 (×3): qty 1

## 2024-08-22 NOTE — Assessment & Plan Note (Signed)
 Continue statin

## 2024-08-22 NOTE — Hospital Course (Addendum)
 Partly taken from prior notes.  Emma Rivera is a pleasant 83 y.o. female with medical history significant for A-fib on Eliquis , history of stroke, HTN, HLD, GERD, depression, hypothyroidism who came into ED from cardiologist office after she was found to have orthostatic hypotension.  Patient was having black-colored stool for the past 3 to 4 days.  Patient also has a recent fall with injury to left foot. Last dose of Eliquis  was on 10/9 AM.  On presentation normotensive, labs with hemoglobin of 9.5-it was 12.52 weeks ago, BUN 56, creatinine 1.44, INR 1.6. Left foot x-ray with multiple nondisplaced small fractures involving the base of the middle phalanx of the third toe laterally, a small avulsion fracture involving the lateral base of the fourth middle phalanx and his small fracture involving the distal dorsal cortex of the talus.  Podiatry and GI was consulted.  Eliquis  was held.  10/10: Vital stable, INR improved 1.2, hemoglobin with further decreased to 7.2-ordered 2 unit of PRBC, holding antihypertensives for concern of active bleeding. Platelets 140, improving AKI with creatinine now at 1.14-baseline seems to be around 1  GI is planning EGD on Sunday after Eliquis  washout. Podiatry is recommending WBAT with surgical/postoperative shoe.

## 2024-08-22 NOTE — Assessment & Plan Note (Signed)
 Patient had multiple small nondisplaced fractures involving left foot, likely secondary to fall.  Podiatry is recommending conservative management, with WBAT and postoperative boot. - PT evaluation

## 2024-08-22 NOTE — Assessment & Plan Note (Signed)
 Currently on IV Protonix

## 2024-08-22 NOTE — Progress Notes (Signed)
 [facility] Inpatient Progress Note    Subjective: Patient seen for f/u melena while on Eliquis  anticoagulant. No further bleeding. Day 1 off eliquis .  Objective: Vital signs in last 24 hours: Temp:  [97.5 F (36.4 C)-98.7 F (37.1 C)] 98.2 F (36.8 C) (10/10 2035) Pulse Rate:  [53-63] 53 (10/10 2035) Resp:  [14-18] 18 (10/10 2035) BP: (116-150)/(46-59) 140/50 (10/10 2035) SpO2:  [96 %-99 %] 96 % (10/10 2035) Blood pressure (!) 140/50, pulse (!) 53, temperature 98.2 F (36.8 C), resp. rate 18, height 5' 2 (1.575 m), weight 78 kg, SpO2 96%.    Intake/Output from previous day: 10/09 0701 - 10/10 0700 In: 584.9 [P.O.:240; I.V.:344.9] Out: -   Intake/Output this shift: No intake/output data recorded.   Gen: NAD. Appears comfortable.  HEENT: [place]/AT. PERRLA. Normal external ear exam.  Chest: CTA, no wheezes.  CV: RR nl S1, S2. No gallops.  Abd: soft, nt, nd. BS+  Ext: no edema. Pulses 2+  Neuro: Alert and oriented. Judgement appears normal. Nonfocal.   Lab Results: Results for orders placed or performed during the hospital encounter of 08/21/24 (from the past 24 hours)  Basic metabolic panel     Status: Abnormal   Collection Time: 08/22/24  5:38 AM  Result Value Ref Range   Sodium 145 135 - 145 mmol/L   Potassium 3.7 3.5 - 5.1 mmol/L   Chloride 114 (H) 98 - 111 mmol/L   CO2 28 22 - 32 mmol/L   Glucose, Bld 95 70 - 99 mg/dL   BUN 32 (H) 8 - 23 mg/dL   Creatinine, Ser 8.85 (H) 0.44 - 1.00 mg/dL   Calcium  7.9 (L) 8.9 - 10.3 mg/dL   GFR, Estimated 48 (L) >60 mL/min   Anion gap 3 (L) 5 - 15  CBC     Status: Abnormal   Collection Time: 08/22/24  5:38 AM  Result Value Ref Range   WBC 4.4 4.0 - 10.5 K/uL   RBC 2.19 (L) 3.87 - 5.11 MIL/uL   Hemoglobin 7.2 (L) 12.0 - 15.0 g/dL   HCT 78.0 (L) 63.9 - 53.9 %   MCV 100.0 80.0 - 100.0 fL   MCH 32.9 26.0 - 34.0 pg   MCHC 32.9 30.0 - 36.0 g/dL   RDW 85.3 88.4 - 84.4 %   Platelets 140 (L) 150 - 400  K/uL   nRBC 0.0 0.0 - 0.2 %  Protime-INR     Status: Abnormal   Collection Time: 08/22/24  5:38 AM  Result Value Ref Range   Prothrombin Time 16.3 (H) 11.4 - 15.2 seconds   INR 1.2 0.8 - 1.2  ABO/Rh     Status: None   Collection Time: 08/22/24  5:38 AM  Result Value Ref Range   ABO/RH(D)      A POS Performed at [facility], [address] [place], [place] [address]   Prepare RBC (crossmatch)     Status: None   Collection Time: 08/22/24  8:35 AM  Result Value Ref Range   Order Confirmation      ORDER PROCESSED BY BLOOD BANK Performed at [facility], [address] [place], [place] [address]      Recent Labs    08/21/24 1025 08/22/24 0538  WBC 9.7 4.4  HGB 9.5* 7.2*  HCT 28.6* 21.9*  PLT 226 140*   BMET Recent Labs    08/21/24 1025 08/22/24 0538  NA 141 145  K 3.5 3.7  CL 105 114*  CO2  28 28  GLUCOSE 105* 95  BUN 56* 32*  CREATININE 1.44* 1.14*  CALCIUM  9.5 7.9*   LFT Recent Labs    08/21/24 1025  PROT 6.4*  ALBUMIN 3.9  AST 26  ALT 20  ALKPHOS 29*  BILITOT 0.6   PT/INR Recent Labs    08/21/24 1025 08/22/24 0538  LABPROT 20.2* 16.3*  INR 1.6* 1.2   Hepatitis Panel No results for input(s): HEPBSAG, HCVAB, HEPAIGM, HEPBIGM in the last 72 hours. C-Diff No results for input(s): CDIFFTOX in the last 72 hours. No results for input(s): CDIFFPCR in the last 72 hours.   Studies/Results: DG Foot Complete Left Result Date: 08/21/2024 CLINICAL DATA:  [place]. Pain swelling and bruising of the third and fourth toes. EXAM: LEFT FOOT - COMPLETE 3+ VIEW COMPARISON:  None Available. FINDINGS: Small corner fracture involving the base of the middle phalanx of the third toe laterally. Small avulsion fracture also noted along the lateral base of the fourth middle phalanx. No other midfoot forefoot fractures are identified. The lateral film demonstrates a small avulsion fracture involving the distal dorsal cortex of the talus.  IMPRESSION: 1. Small corner fracture involving the base of the middle phalanx of the third toe laterally. 2. Small avulsion fracture involving the lateral base of the fourth middle phalanx. 3. Small avulsion fracture involving the distal dorsal cortex of the talus. Electronically Signed   By: MYRTIS Stammer M.D.   On: 08/21/2024 11:44   DG Ankle 2 Views Left Result Date: 08/21/2024 CLINICAL DATA:  Pain after fall. EXAM: LEFT ANKLE - 2 VIEW COMPARISON:  None Available. FINDINGS: Punctate linear mineralization adjacent to the dorsal talar head/neck may reflect an avulsion fracture. No dislocation. No additional fracture identified. Soft tissue swelling of the anterolateral ankle. IMPRESSION: Findings suspicious for small linear avulsion fracture of the dorsal talar head/neck. Electronically Signed   By: Harrietta Sherry M.D.   On: 08/21/2024 11:17    Scheduled Inpatient Medications:    [START ON 08/23/2024] amLODipine   10 mg Oral Daily   atorvastatin   20 mg Oral q1800   citalopram   20 mg Oral q AM   levothyroxine  50 mcg Oral Q0200   [START ON 08/23/2024] losartan   50 mg Oral Daily   pantoprazole  (PROTONIX ) IV  40 mg Intravenous Q12H    Continuous Inpatient Infusions:    PRN Inpatient Medications:  acetaminophen  **OR** acetaminophen , morphine injection, oxyCODONE   Miscellaneous: N/A  Assessment:  Principal Problem:   GI bleeding Active Problems:   CVA (cerebral vascular accident) (HCC) - L frontal embolic d/t AF s/p IV TPA   Essential hypertension   Hyperlipidemia LDL goal <70   Dyspnea on exertion   History of stroke   GERD without esophagitis   Acute blood loss anemia   Foot fracture, left   Paroxysmal atrial fibrillation (HCC) 83 y/o female with melena while on anticoagulant Eliquis . Currently stable.   Plan:  EGD when off Eliquis  48 or more hours. Patient agreeable. Continue soft diet. Following  Haylyn Halberg K. Aundria, M.D. 08/22/2024, 9:22 PM

## 2024-08-22 NOTE — Assessment & Plan Note (Signed)
 Hemoglobin decreased to 7.2 this morning, Hemoglobin was 12.52 weeks ago.  Symptomatic anemia with dizziness and weakness secondary to GI bleed. - 2 unit of PRBC ordered -Monitor hemoglobin

## 2024-08-22 NOTE — Assessment & Plan Note (Signed)
 Not on any nodal blocking agent at home.  Currently in sinus rhythm. - Holding home Eliquis  for concern of GI bleed

## 2024-08-22 NOTE — Consult Note (Signed)
 Full note to follow. Asked to see re: foot fracture Xray reviewed and shows non-displaced toe fracture and small fleck fracture dorsal talar neck.  Recommend WBAT to foot with post op shoe.  Can wrap with ace wrap for compression.

## 2024-08-22 NOTE — Assessment & Plan Note (Signed)
 Patient presented with orthostatic vitals and dizziness likely secondary to GI bleed.  Having melena for the past 3 to 4 days with significant decrease in hemoglobin. GI was consulted -EGD on Sunday morning after washout of Eliquis , last dose was on 10/9 AM -Continue with IV Protonix  -Continue with supportive care

## 2024-08-22 NOTE — Assessment & Plan Note (Signed)
 Blood pressure borderline soft this morning. - Holding home antihypertensives today and we can resume tomorrow if blood pressure remains stable and trending up

## 2024-08-22 NOTE — Progress Notes (Addendum)
 Progress Note   Patient: Emma Rivera FMW:[id number] DOB: Feb 06, 1941 DOA: 08/21/2024     1 DOS: the patient was seen and examined on 08/22/2024   Brief hospital course: Partly taken from prior notes.  Emma Rivera is a pleasant 83 y.o. female with medical history significant for A-fib on Eliquis , history of stroke, HTN, HLD, GERD, depression, hypothyroidism who came into ED from cardiologist office after she was found to have orthostatic hypotension.  Patient was having black-colored stool for the past 3 to 4 days.  Patient also has a recent fall with injury to left foot. Last dose of Eliquis  was on 10/9 AM.  On presentation normotensive, labs with hemoglobin of 9.5-it was 12.52 weeks ago, BUN 56, creatinine 1.44, INR 1.6. Left foot x-ray with multiple nondisplaced small fractures involving the base of the middle phalanx of the third toe laterally, a small avulsion fracture involving the lateral base of the fourth middle phalanx and his small fracture involving the distal dorsal cortex of the talus.  Podiatry and GI was consulted.  Eliquis  was held.  10/10: Vital stable, INR improved 1.2, hemoglobin with further decreased to 7.2-ordered 2 unit of PRBC, holding antihypertensives for concern of active bleeding. Platelets 140, improving AKI with creatinine now at 1.14-baseline seems to be around 1  GI is planning EGD on Sunday after Eliquis  washout. Podiatry is recommending WBAT with surgical/postoperative shoe.  Assessment and Plan: * GI bleeding Patient presented with orthostatic vitals and dizziness likely secondary to GI bleed.  Having melena for the past 3 to 4 days with significant decrease in hemoglobin. GI was consulted -EGD on Sunday morning after washout of Eliquis , last dose was on 10/9 AM -Continue with IV Protonix  -Continue with supportive care  Acute blood loss anemia Hemoglobin decreased to 7.2 this morning, Hemoglobin was 12.52 weeks ago.  Symptomatic anemia with dizziness  and weakness secondary to GI bleed. - 2 unit of PRBC ordered -Monitor hemoglobin  Paroxysmal atrial fibrillation (HCC) Not on any nodal blocking agent at home.  Currently in sinus rhythm. - Holding home Eliquis  for concern of GI bleed  Foot fracture, left Patient had multiple small nondisplaced fractures involving left foot, likely secondary to fall.  Podiatry is recommending conservative management, with WBAT and postoperative boot. - PT evaluation  Essential hypertension Blood pressure borderline soft this morning. - Holding home antihypertensives today and we can resume tomorrow if blood pressure remains stable and trending up  History of stroke - Continue statin  Hyperlipidemia LDL goal <70 - Continue statin  GERD without esophagitis - Currently on IV Protonix    Subjective: Patient was seen and examined today.  Did not had any bowel movement since in the hospital.  Still feeling weak.  Physical Exam: Vitals:   08/22/24 0806 08/22/24 0926 08/22/24 0944 08/22/24 1230  BP: (!) 119/51 (!) 128/54 (!) 116/53 (!) 128/50  Pulse: (!) 57 (!) 59 (!) 58 (!) 57  Resp: 15 16 16 16   Temp: 98 F (36.7 C) 98.1 F (36.7 C) (!) 97.5 F (36.4 C) 97.9 F (36.6 C)  TempSrc: Oral Oral Oral Oral  SpO2: 98% 97%    Weight:      Height:       General.  Frail elderly lady, in no acute distress. Pulmonary.  Lungs clear bilaterally, normal respiratory effort. CV.  Regular rate and rhythm, no JVD, rub or murmur. Abdomen.  Soft, nontender, nondistended, BS positive. CNS.  Alert and oriented .  No focal neurologic deficit. Extremities.  No edema, no cyanosis, pulses intact and symmetrical. Psychiatry.  Judgment and insight appears normal.   Data Reviewed: Prior data reviewed  Family Communication: Talked with husband on phone  Disposition: Status is: Inpatient Remains inpatient appropriate because: Severity of illness  Planned Discharge Destination: Home  DVT prophylaxis. SCd's Time  spent: 50 minutes  This record has been created using Conservation officer, historic buildings. Errors have been sought and corrected,but may not always be located. Such creation errors do not reflect on the standard of care.   Author: Amaryllis Dare, MD 08/22/2024 4:08 PM  For on call review [email or web address].

## 2024-08-22 NOTE — Progress Notes (Signed)
 Mobility Specialist - Progress Note   08/22/24 1156  Mobility  Activity Ambulated with assistance  Level of Assistance Standby assist, set-up cues, supervision of patient - no hands on  Assistive Device Front wheel walker  Distance Ambulated (ft) 12 ft  Activity Response Tolerated well  Mobility visit 1 Mobility  Mobility Specialist Start Time (ACUTE ONLY) 1135  Mobility Specialist Stop Time (ACUTE ONLY) 1145  Mobility Specialist Time Calculation (min) (ACUTE ONLY) 10 min   Pt amb to/from the bathroom w/ close sup, tolerated well--- MinA for IV pole. Upon return to the bed, Pt expressed fatigue. Pt left supine with alarm set and needs within reach.  Emma Rivera Mobility Specialist 08/22/24 12:00 PM

## 2024-08-22 NOTE — Consult Note (Signed)
 ORTHOPAEDIC CONSULTATION  REQUESTING PHYSICIAN: Caleen Qualia, MD  Chief Complaint: Left foot fracture  HPI: Emma Rivera is a 83 y.o. female who complains of recent left foot and ankle injury.  Admitted and had recent twisting injury to her left foot.  X-rays showed fractures of the toes and small fleck fracture on the dorsal talus.  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   a.) CTA head/neck showed emergent M2 occlusion; CT head negative --> neurology consulted and recommended TPA and transfer to [facility] for possible thrombectomy; b.) repeat CT at [facility] revealed interval resolution of M2 occlusion --> admitted; c.) MRI brain 12/17/2018: acute LEFT MCA infarct with 3 punctate areas of restricted diffusion (largest area involved the LEFT insular cortex)   Atrial fibrillation (HCC) 12/16/2018   a.) new onset 12/16/2018; b.) CHA2DS2-VASc = 6 (age x2, sex, HTN, CVA x2) as of 10/19/2023; c.) s/p DCCV (120 J x 1) with ERAF 04/01/2019; d.) s/p ablation 06/13/2019; e.) s/p DCCV (150 J x1) 03/12/2023; f.) s/p repeat ablation 06/21/2023; g.) cardiac rate/rhythm currently maintained instrinsically without pharmacological intervention; chronically anticoagulated using standard dose apixaban    Baker's cyst of knee, left    Cerebral microvascular disease    DDD (degenerative disc disease), cervical    DDD (degenerative disc disease), lumbar    Depression    Diastolic dysfunction 03/23/2023   a.) TTE 03/23/2023: EF 60-65%, no RWMAs, G2DD, RVSF/RVSP normal, mod LAE, mild RAE, AoV sclerosis with no stenosis   Diverticulosis    Esophageal spasm    Essential hypertension    GERD (gastroesophageal reflux disease)    Heart murmur    History of bilateral cataract extraction 2016   History of kidney stones    History of stroke involving cerebellum    a.) date of neurovascular event unknown; infarct of the posterior inferior LEFT cerebellum noted on MRI brain done  on 12/17/2018 performed in setting of acute LEFT MCA infarct   HOH (hard of hearing)    Hyperlipidemia    Moderate persistent asthma    NSVT (nonsustained ventricular tachycardia) (HCC) 01/21/2019   a.) Zio 01/21/2019: 2 runs NSVT lasting up to 18 beats at max rate of 174 bpm;  up to 4.6 second post-termination pauses   On apixaban  therapy    Osteoarthritis    Prediabetes    PSVT (paroxysmal supraventricular tachycardia) 10/09/2023   a.) Zio 10/09/2023: 37 runs each lasting < 17 seconds (max rate 210 bpm)   Past Surgical History:  Procedure Laterality Date   ATRIAL FIBRILLATION ABLATION N/A 06/13/2019   Procedure: ATRIAL FIBRILLATION ABLATION;  Surgeon: Inocencio Soyla Lunger, MD;  Location: [facility] INVASIVE CV [facility];  Service: Cardiovascular;  Laterality: N/A;   ATRIAL FIBRILLATION ABLATION N/A 06/21/2023   Procedure: ATRIAL FIBRILLATION ABLATION;  Surgeon: Inocencio Soyla Lunger, MD;  Location: [facility] CV [facility];  Service: Cardiovascular;  Laterality: N/A;   CARDIAC CATHETERIZATION     CARDIOVERSION N/A 04/01/2019   Procedure: CARDIOVERSION;  Surgeon: Maranda Leim DEL, MD;  Location: [facility] ENDOSCOPY;  Service: Cardiovascular;  Laterality: N/A;   CARDIOVERSION N/A 03/12/2023   Procedure: CARDIOVERSION;  Surgeon: Hobart Powell BRAVO, MD;  Location: [facility] INVASIVE CV LAB;  Service: Cardiovascular;  Laterality: N/A;   CATARACT EXTRACTION W/PHACO Left 09/09/2015   Procedure: CATARACT EXTRACTION PHACO AND INTRAOCULAR LENS PLACEMENT (IOC);  Surgeon: Newell Ovens, MD;  Location: [facility] ORS;  Service: Ophthalmology;  Laterality: Left;  US    1:10.5 AP  8.6 CDE  6.05 casette lot #  [id number]   CATARACT EXTRACTION W/PHACO Right 09/30/2015   Procedure: CATARACT EXTRACTION PHACO AND INTRAOCULAR LENS PLACEMENT (IOC);  Surgeon: Newell Ovens, MD;  Location: [facility] ORS;  Service: Ophthalmology;  Laterality: Right;  US  01:00.3 AP%: 11.6 CDE: 7.00 FLUID LOT# [id number]   COLONOSCOPY  01/02/2007   COLONOSCOPY  WITH PROPOFOL  N/A 04/04/2017   Procedure: COLONOSCOPY WITH PROPOFOL ;  Surgeon: Viktoria Lamar DASEN, MD;  Location: [facility] ENDOSCOPY;  Service: Endoscopy;  Laterality: N/A;   ESOPHAGOGASTRODUODENOSCOPY  10/09/2007   TONSILLECTOMY     TOTAL KNEE ARTHROPLASTY Left 10/23/2023   Procedure: TOTAL KNEE ARTHROPLASTY;  Surgeon: Edie Norleen PARAS, MD;  Location: [facility] ORS;  Service: Orthopedics;  Laterality: Left;   Social History   Socioeconomic History   Marital status: Married    Spouse name: Kayla   Number of children: 2   Years of education: Not on file   Highest education level: Not on file  Occupational History   Not on file  Tobacco Use   Smoking status: Never   Smokeless tobacco: Never   Tobacco comments:    Never smoked 07/19/23  Vaping Use   Vaping status: Never Used  Substance and Sexual Activity   Alcohol  use: Yes    Alcohol /week: 1.0 standard drink of alcohol     Types: 1 Glasses of wine per week    Comment: occasional   Drug use: No   Sexual activity: Not on file  Other Topics Concern   Not on file  Social History Narrative   Not on file   Social Drivers of Health   Financial Resource Strain: Low Risk  (05/30/2024)   Received from [facility]   Overall Financial Resource Strain (CARDIA)    Difficulty of Paying Living Expenses: Not hard at all  Food Insecurity: No Food Insecurity (08/21/2024)   Hunger Vital Sign    Worried About Running Out of Food in the Last Year: Never true    Ran Out of Food in the Last Year: Never true  Transportation Needs: No Transportation Needs (08/21/2024)   PRAPARE - Administrator, Civil Service (Medical): No    Lack of Transportation (Non-Medical): No  Physical Activity: Not on file  Stress: Not on file  Social Connections: Socially Integrated (08/21/2024)   Social Connection and Isolation Panel    Frequency of Communication with Friends and Family: More than three times a week    Frequency of Social Gatherings  with Friends and Family: Once a week    Attends Religious Services: More than 4 times per year    Active Member of [employer] or Organizations: Yes    Attends Engineer, structural: More than 4 times per year    Marital Status: Married   Family History  Problem Relation Age of Onset   Breast cancer Maternal Aunt    Hypertension Mother    Heart disease Mother    Heart disease Father    Hypertension Father    Allergies  Allergen Reactions   Imdur  [Isosorbide  Nitrate] Other (See Comments)    Hair loss   Isosorbide  Dinitrate Other (See Comments)    HA with imdur     Hair loss   Sulfa Antibiotics Nausea Only   Prior to Admission medications   Medication Sig Start Date End Date Taking? Authorizing Provider  acetaminophen  (TYLENOL ) 500 MG tablet Take 1,000 mg by mouth every 6 (six) hours as needed for moderate pain or headache.  Yes [provider]  albuterol  (VENTOLIN  HFA) 108 (90 Base) MCG/ACT inhaler Inhale 1-2 puffs into the lungs every 6 (six) hours as needed (exercise/wheezing/shortness of breath.). 12/24/19  Yes [provider]  amLODipine  (NORVASC ) 10 MG tablet Take 10 mg by mouth daily.   Yes [provider]  apixaban  (ELIQUIS ) 5 MG TABS tablet Take 1 tablet (5 mg total) by mouth 2 (two) times daily. 12/18/18  Yes Noemi Reena CROME, NP  atorvastatin  (LIPITOR) 20 MG tablet TAKE 1 TABLET (20 MG TOTAL) BY MOUTH DAILY AT 6 PM. 03/11/19  Yes End, Lonni, MD  Calcium  Carbonate-Vitamin D (CALCIUM -D PO) Take 2 tablets by mouth daily.   Yes [provider]  Carboxymethylcellul-Glycerin (LUBRICATING EYE DROPS OP) Place 1 drop into both eyes daily as needed (dry eyes).   Yes [provider]  chlorhexidine  (PERIDEX ) 0.12 % solution Use as directed 15 mLs in the mouth or throat 2 (two) times daily. 03/13/24  Yes [provider]  citalopram  (CELEXA ) 20 MG tablet Take 20 mg by mouth in the morning.   Yes [provider]  EPINEPHrine   0.3 mg/0.3 mL IJ SOAJ injection Inject 0.3 mg into the muscle as needed. 04/17/24  Yes [provider]  FASENRA PEN 30 MG/ML prefilled autoinjector Inject 30 mg into the skin every 30 (thirty) days. 06/02/24  Yes [provider]  hydrochlorothiazide  (HYDRODIURIL ) 25 MG tablet Take 25 mg by mouth in the morning. 12/28/18  Yes [provider]  levothyroxine (SYNTHROID) 50 MCG tablet Take 50 mcg by mouth daily at 2 am. 07/10/24  Yes [provider]  loratadine  (CLARITIN ) 10 MG tablet Take 10 mg by mouth daily as needed for allergies.   Yes [provider]  losartan  (COZAAR ) 50 MG tablet Take 50 mg by mouth daily.   Yes [provider]  montelukast (SINGULAIR) 10 MG tablet Take 10 mg by mouth at bedtime.   Yes [provider]  Multiple Vitamin (MULTIVITAMIN) tablet Take 1 tablet by mouth in the morning.   Yes [provider]  omeprazole (PRILOSEC) 20 MG capsule Take 20 mg by mouth daily before breakfast.   Yes [provider]  predniSONE (DELTASONE) 5 MG tablet Take 5 mg by mouth daily at 2 am. 07/16/24 10/14/24 Yes [provider]  tolterodine (DETROL) 2 MG tablet Take 2 mg by mouth 2 (two) times daily. 02/14/23  Yes [provider]  torsemide  (DEMADEX ) 10 MG tablet Take 1 tablet (10 mg total) by mouth 3 (three) times a week. Patient only takes 3 08/06/24 08/06/25 Yes Gerard Frederick, NP   DG Foot Complete Left Result Date: 08/21/2024 CLINICAL DATA:  Clemens. Pain swelling and bruising of the third and fourth toes. EXAM: LEFT FOOT - COMPLETE 3+ VIEW COMPARISON:  None Available. FINDINGS: Small corner fracture involving the base of the middle phalanx of the third toe laterally. Small avulsion fracture also noted along the lateral base of the fourth middle phalanx. No other midfoot forefoot fractures are identified. The lateral film demonstrates a small avulsion fracture involving the distal dorsal cortex of the talus.  IMPRESSION: 1. Small corner fracture involving the base of the middle phalanx of the third toe laterally. 2. Small avulsion fracture involving the lateral base of the fourth middle phalanx. 3. Small avulsion fracture involving the distal dorsal cortex of the talus. Electronically Signed   By: MYRTIS Stammer M.D.   On: 08/21/2024 11:44   DG Ankle 2 Views Left Result Date: 08/21/2024 CLINICAL  DATA:  Pain after fall. EXAM: LEFT ANKLE - 2 VIEW COMPARISON:  None Available. FINDINGS: Punctate linear mineralization adjacent to the dorsal talar head/neck may reflect an avulsion fracture. No dislocation. No additional fracture identified. Soft tissue swelling of the anterolateral ankle. IMPRESSION: Findings suspicious for small linear avulsion fracture of the dorsal talar head/neck. Electronically Signed   By: Harrietta Sherry M.D.   On: 08/21/2024 11:17    Positive ROS: All other systems have been reviewed and were otherwise negative with the exception of those mentioned in the HPI and as above.  12 point ROS was performed.  Physical Exam: General: Alert and oriented.  No apparent distress.  Vascular:  Left foot:Dorsalis Pedis:  present Posterior Tibial:  present  Right foot: Not evaluated  Neuro:intact sensation  Derm: Mild bruising to the left foot.  No fracture blisters.  Ortho/MS: Tender with palpation at the 3rd and 4th toes on left foot.  Mild tenderness with palpation to the ankle.  Mild edema to the ankle.  I personally reviewed the x-rays that shows nondisplaced digital fractures and a small fleck fracture on the dorsal talus   Assessment: Fracture 3rd and 4th toes left foot with avulsion fracture dorsal talar neck  Plan: I discussed weightbearing as tolerated.  She is in a short boot at this time.  She can ambulate as tolerated with this.  I discussed when she feels comfortable she can transition into a sneaker or athletic shoe.  She has some mild edema and an Ace wrap could be  beneficial for this as well.  I suspect she will recover from this quite easily.  Over the next several weeks she should be mostly asymptomatic.  I discussed if she is have any problems after discharge and needs follow-up I am happy to see her in the outpatient clinic.    Ashley Eva LABOR, DPM Cell [phone]   08/22/2024 2:53 PM

## 2024-08-23 DIAGNOSIS — K921 Melena: Secondary | ICD-10-CM | POA: Diagnosis not present

## 2024-08-23 LAB — CBC WITH DIFFERENTIAL/PLATELET
Abs Immature Granulocytes: 0.02 K/uL (ref 0.00–0.07)
Basophils Absolute: 0 K/uL (ref 0.0–0.1)
Basophils Relative: 0 %
Eosinophils Absolute: 0 K/uL (ref 0.0–0.5)
Eosinophils Relative: 0 %
HCT: 33.4 % — ABNORMAL LOW (ref 36.0–46.0)
Hemoglobin: 11.1 g/dL — ABNORMAL LOW (ref 12.0–15.0)
Immature Granulocytes: 0 %
Lymphocytes Relative: 23 %
Lymphs Abs: 1.2 K/uL (ref 0.7–4.0)
MCH: 31.5 pg (ref 26.0–34.0)
MCHC: 33.2 g/dL (ref 30.0–36.0)
MCV: 94.9 fL (ref 80.0–100.0)
Monocytes Absolute: 0.5 K/uL (ref 0.1–1.0)
Monocytes Relative: 9 %
Neutro Abs: 3.5 K/uL (ref 1.7–7.7)
Neutrophils Relative %: 68 %
Platelets: 155 K/uL (ref 150–400)
RBC: 3.52 MIL/uL — ABNORMAL LOW (ref 3.87–5.11)
RDW: 15.7 % — ABNORMAL HIGH (ref 11.5–15.5)
WBC: 5.3 K/uL (ref 4.0–10.5)
nRBC: 0 % (ref 0.0–0.2)

## 2024-08-23 LAB — BASIC METABOLIC PANEL WITH GFR
Anion gap: 7 (ref 5–15)
BUN: 21 mg/dL (ref 8–23)
CO2: 26 mmol/L (ref 22–32)
Calcium: 8.3 mg/dL — ABNORMAL LOW (ref 8.9–10.3)
Chloride: 109 mmol/L (ref 98–111)
Creatinine, Ser: 1.06 mg/dL — ABNORMAL HIGH (ref 0.44–1.00)
GFR, Estimated: 52 mL/min — ABNORMAL LOW (ref >60)
Glucose, Bld: 91 mg/dL (ref 70–99)
Potassium: 3.5 mmol/L (ref 3.5–5.1)
Sodium: 142 mmol/L (ref 135–145)

## 2024-08-23 LAB — BPAM RBC
Blood Product Expiration Date: 202511082359
Blood Product Expiration Date: 202511082359
ISSUE DATE / TIME: 202510100920
ISSUE DATE / TIME: 202510101614
Unit Type and Rh: 6200
Unit Type and Rh: 6200

## 2024-08-23 LAB — TYPE AND SCREEN
ABO/RH(D): A POS
Antibody Screen: NEGATIVE
Unit division: 0
Unit division: 0

## 2024-08-23 MED ORDER — SODIUM CHLORIDE 0.9 % IV SOLN: INTRAVENOUS | Status: DC

## 2024-08-23 NOTE — Progress Notes (Signed)
 [facility] Inpatient Progress Note    Subjective: Patient seen for follow up of melena. Besides mild fatigue, no acute issues. Day 2 off Eliquis .  Objective: Vital signs in last 24 hours: Temp:  [97.9 F (36.6 C)-98.7 F (37.1 C)] 97.9 F (36.6 C) (10/11 0829) Pulse Rate:  [51-60] 51 (10/11 0829) Resp:  [14-20] 16 (10/11 0829) BP: (117-147)/(43-62) 147/53 (10/11 0829) SpO2:  [96 %-99 %] 96 % (10/11 0829) Blood pressure (!) 147/53, pulse (!) 51, temperature 97.9 F (36.6 C), temperature source Oral, resp. rate 16, height 5' 2 (1.575 m), weight 78 kg, SpO2 96%.    Intake/Output from previous day: 10/10 0701 - 10/11 0700 In: 692 [P.O.:240; I.V.:50; Blood:402] Out: -   Intake/Output this shift: Total I/O In: 480 [P.O.:480] Out: -    Gen: NAD. Appears comfortable.  HEENT: [place]/AT. PERRLA. Normal external ear exam.  Chest: CTA, no wheezes.  CV: RR nl S1, S2. No gallops.  Abd: soft, nt, nd. BS+  Ext: no edema. Pulses 2+  Neuro: Alert and oriented. Judgement appears normal. Nonfocal.   Lab Results: Results for orders placed or performed during the hospital encounter of 08/21/24 (from the past 24 hours)  CBC with Differential/Platelet     Status: Abnormal   Collection Time: 08/23/24  9:11 AM  Result Value Ref Range   WBC 5.3 4.0 - 10.5 K/uL   RBC 3.52 (L) 3.87 - 5.11 MIL/uL   Hemoglobin 11.1 (L) 12.0 - 15.0 g/dL   HCT 66.5 (L) 63.9 - 53.9 %   MCV 94.9 80.0 - 100.0 fL   MCH 31.5 26.0 - 34.0 pg   MCHC 33.2 30.0 - 36.0 g/dL   RDW 84.2 (H) 88.4 - 84.4 %   Platelets 155 150 - 400 K/uL   nRBC 0.0 0.0 - 0.2 %   Neutrophils Relative % 68 %   Neutro Abs 3.5 1.7 - 7.7 K/uL   Lymphocytes Relative 23 %   Lymphs Abs 1.2 0.7 - 4.0 K/uL   Monocytes Relative 9 %   Monocytes Absolute 0.5 0.1 - 1.0 K/uL   Eosinophils Relative 0 %   Eosinophils Absolute 0.0 0.0 - 0.5 K/uL   Basophils Relative 0 %   Basophils Absolute 0.0 0.0 - 0.1 K/uL   Immature  Granulocytes 0 %   Abs Immature Granulocytes 0.02 0.00 - 0.07 K/uL  Basic metabolic panel     Status: Abnormal   Collection Time: 08/23/24  9:11 AM  Result Value Ref Range   Sodium 142 135 - 145 mmol/L   Potassium 3.5 3.5 - 5.1 mmol/L   Chloride 109 98 - 111 mmol/L   CO2 26 22 - 32 mmol/L   Glucose, Bld 91 70 - 99 mg/dL   BUN 21 8 - 23 mg/dL   Creatinine, Ser 8.93 (H) 0.44 - 1.00 mg/dL   Calcium  8.3 (L) 8.9 - 10.3 mg/dL   GFR, Estimated 52 (L) >60 mL/min   Anion gap 7 5 - 15     Recent Labs    08/21/24 1025 08/22/24 0538 08/23/24 0911  WBC 9.7 4.4 5.3  HGB 9.5* 7.2* 11.1*  HCT 28.6* 21.9* 33.4*  PLT 226 140* 155   BMET Recent Labs    08/21/24 1025 08/22/24 0538 08/23/24 0911  NA 141 145 142  K 3.5 3.7 3.5  CL 105 114* 109  CO2 28 28 26   GLUCOSE 105* 95 91  BUN 56* 32* 21  CREATININE 1.44* 1.14* 1.06*  CALCIUM  9.5  7.9* 8.3*   LFT Recent Labs    08/21/24 1025  PROT 6.4*  ALBUMIN 3.9  AST 26  ALT 20  ALKPHOS 29*  BILITOT 0.6   PT/INR Recent Labs    08/21/24 1025 08/22/24 0538  LABPROT 20.2* 16.3*  INR 1.6* 1.2   Hepatitis Panel No results for input(s): HEPBSAG, HCVAB, HEPAIGM, HEPBIGM in the last 72 hours. C-Diff No results for input(s): CDIFFTOX in the last 72 hours. No results for input(s): CDIFFPCR in the last 72 hours.   Studies/Results: No results found.  Scheduled Inpatient Medications:    amLODipine   10 mg Oral Daily   atorvastatin   20 mg Oral q1800   citalopram   20 mg Oral q AM   levothyroxine  50 mcg Oral Q0200   losartan   50 mg Oral Daily   pantoprazole  (PROTONIX ) IV  40 mg Intravenous Q12H    Continuous Inpatient Infusions:    PRN Inpatient Medications:  acetaminophen  **OR** acetaminophen , morphine injection, oxyCODONE   Miscellaneous: N/A  Assessment:  Principal Problem:   GI bleeding Active Problems:   CVA (cerebral vascular accident) (HCC) - L frontal embolic d/t AF s/p IV TPA   Essential  hypertension   Hyperlipidemia LDL goal <70   Dyspnea on exertion   History of stroke   GERD without esophagitis   Acute blood loss anemia   Foot fracture, left   Paroxysmal atrial fibrillation (HCC) Melena clinically resolved. Unknown source, but likely UGI in origin.  Plan:  NPO after MN. IV fluids. Serial H/H. EGD in AM. The patient understands the nature of the planned procedure, indications, risks, alternatives and potential complications including but not limited to bleeding, infection, perforation, damage to internal organs and possible oversedation/side effects from anesthesia. The patient agrees and gives consent to proceed.  Please refer to procedure notes for findings, recommendations and patient disposition/instructions.   Charina Fons K. Aundria, M.D. 08/23/2024, 1:33 PM

## 2024-08-23 NOTE — Plan of Care (Signed)

## 2024-08-23 NOTE — H&P (View-Only) (Signed)
 [facility] Gastro[facility] Inpatient Progress Note    Subjective: Patient seen for follow up of melena. Besides mild fatigue, no acute issues. Day 2 off Eliquis .  Objective: Vital signs in last 24 hours: Temp:  [97.9 F (36.6 C)-98.7 F (37.1 C)] 97.9 F (36.6 C) (10/11 0829) Pulse Rate:  [51-60] 51 (10/11 0829) Resp:  [14-20] 16 (10/11 0829) BP: (117-147)/(43-62) 147/53 (10/11 0829) SpO2:  [96 %-99 %] 96 % (10/11 0829) Blood pressure (!) 147/53, pulse (!) 51, temperature 97.9 F (36.6 C), temperature source Oral, resp. rate 16, height 5' 2 (1.575 m), weight 78 kg, SpO2 96%.    Intake/Output from previous day: 10/10 0701 - 10/11 0700 In: 692 [P.O.:240; I.V.:50; Blood:402] Out: -   Intake/Output this shift: Total I/O In: 480 [P.O.:480] Out: -    Gen: NAD. Appears comfortable.  HEENT: [place]/AT. PERRLA. Normal external ear exam.  Chest: CTA, no wheezes.  CV: RR nl S1, S2. No gallops.  Abd: soft, nt, nd. BS+  Ext: no edema. Pulses 2+  Neuro: Alert and oriented. Judgement appears normal. Nonfocal.   Lab Results: Results for orders placed or performed during the hospital encounter of 08/21/24 (from the past 24 hours)  CBC with Differential/Platelet     Status: Abnormal   Collection Time: 08/23/24  9:11 AM  Result Value Ref Range   WBC 5.3 4.0 - 10.5 K/uL   RBC 3.52 (L) 3.87 - 5.11 MIL/uL   Hemoglobin 11.1 (L) 12.0 - 15.0 g/dL   HCT 66.5 (L) 63.9 - 53.9 %   MCV 94.9 80.0 - 100.0 fL   MCH 31.5 26.0 - 34.0 pg   MCHC 33.2 30.0 - 36.0 g/dL   RDW 84.2 (H) 88.4 - 84.4 %   Platelets 155 150 - 400 K/uL   nRBC 0.0 0.0 - 0.2 %   Neutrophils Relative % 68 %   Neutro Abs 3.5 1.7 - 7.7 K/uL   Lymphocytes Relative 23 %   Lymphs Abs 1.2 0.7 - 4.0 K/uL   Monocytes Relative 9 %   Monocytes Absolute 0.5 0.1 - 1.0 K/uL   Eosinophils Relative 0 %   Eosinophils Absolute 0.0 0.0 - 0.5 K/uL   Basophils Relative 0 %   Basophils Absolute 0.0 0.0 - 0.1 K/uL   Immature  Granulocytes 0 %   Abs Immature Granulocytes 0.02 0.00 - 0.07 K/uL  Basic metabolic panel     Status: Abnormal   Collection Time: 08/23/24  9:11 AM  Result Value Ref Range   Sodium 142 135 - 145 mmol/L   Potassium 3.5 3.5 - 5.1 mmol/L   Chloride 109 98 - 111 mmol/L   CO2 26 22 - 32 mmol/L   Glucose, Bld 91 70 - 99 mg/dL   BUN 21 8 - 23 mg/dL   Creatinine, Ser 8.93 (H) 0.44 - 1.00 mg/dL   Calcium  8.3 (L) 8.9 - 10.3 mg/dL   GFR, Estimated 52 (L) >60 mL/min   Anion gap 7 5 - 15     Recent Labs    08/21/24 1025 08/22/24 0538 08/23/24 0911  WBC 9.7 4.4 5.3  HGB 9.5* 7.2* 11.1*  HCT 28.6* 21.9* 33.4*  PLT 226 140* 155   BMET Recent Labs    08/21/24 1025 08/22/24 0538 08/23/24 0911  NA 141 145 142  K 3.5 3.7 3.5  CL 105 114* 109  CO2 28 28 26   GLUCOSE 105* 95 91  BUN 56* 32* 21  CREATININE 1.44* 1.14* 1.06*  CALCIUM  9.5  7.9* 8.3*   LFT Recent Labs    08/21/24 1025  PROT 6.4*  ALBUMIN 3.9  AST 26  ALT 20  ALKPHOS 29*  BILITOT 0.6   PT/INR Recent Labs    08/21/24 1025 08/22/24 0538  LABPROT 20.2* 16.3*  INR 1.6* 1.2   Hepatitis Panel No results for input(s): HEPBSAG, HCVAB, HEPAIGM, HEPBIGM in the last 72 hours. C-Diff No results for input(s): CDIFFTOX in the last 72 hours. No results for input(s): CDIFFPCR in the last 72 hours.   Studies/Results: No results found.  Scheduled Inpatient Medications:    amLODipine   10 mg Oral Daily   atorvastatin   20 mg Oral q1800   citalopram   20 mg Oral q AM   levothyroxine  50 mcg Oral Q0200   losartan   50 mg Oral Daily   pantoprazole  (PROTONIX ) IV  40 mg Intravenous Q12H    Continuous Inpatient Infusions:    PRN Inpatient Medications:  acetaminophen  **OR** acetaminophen , morphine injection, oxyCODONE   Miscellaneous: N/A  Assessment:  Principal Problem:   GI bleeding Active Problems:   CVA (cerebral vascular accident) (HCC) - L frontal embolic d/t AF s/p IV TPA   Essential  hypertension   Hyperlipidemia LDL goal <70   Dyspnea on exertion   History of stroke   GERD without esophagitis   Acute blood loss anemia   Foot fracture, left   Paroxysmal atrial fibrillation (HCC) Melena clinically resolved. Unknown source, but likely UGI in origin.  Plan:  NPO after MN. IV fluids. Serial H/H. EGD in AM. The patient understands the nature of the planned procedure, indications, risks, alternatives and potential complications including but not limited to bleeding, infection, perforation, damage to internal organs and possible oversedation/side effects from anesthesia. The patient agrees and gives consent to proceed.  Please refer to procedure notes for findings, recommendations and patient disposition/instructions.   Charina Fons K. Aundria, M.D. 08/23/2024, 1:33 PM

## 2024-08-23 NOTE — Progress Notes (Signed)
 Progress Note    Emma Rivera  FMW:[id number] DOB: 10-02-1941  DOA: 08/21/2024 PCP: Alla Amis, MD      Brief Narrative:    Medical records reviewed and are as summarized below:  Emma Rivera is a 83 y.o. female with medical history significant for A-fib on Eliquis , history of stroke, HTN, HLD, GERD, depression, hypothyroidism, who was referred from the cardiologist office to the ED after she was found to have orthostatic hypotension.  She had complained of black-colored stools for about 3 to 4 days prior to admission.  She also had a recent fall and sustained injury to the left foot.   Hemoglobin on admission was 9.5 and it dropped to 7.2 during hospitalization.  BUN and creatinine were 56 and 1.44 respectively.  X-ray left foot: IMPRESSION: 1. Small corner fracture involving the base of the middle phalanx of the third toe laterally. 2. Small avulsion fracture involving the lateral base of the fourth middle phalanx. 3. Small avulsion fracture involving the distal dorsal cortex of the talus.     Assessment/Plan:   Principal Problem:   GI bleeding Active Problems:   Acute blood loss anemia   Foot fracture, left   Paroxysmal atrial fibrillation (HCC)   Essential hypertension   History of stroke   Hyperlipidemia LDL goal <70   GERD without esophagitis   CVA (cerebral vascular accident) (HCC) - L frontal embolic d/t AF s/p IV TPA   Dyspnea on exertion    Body mass index is 31.45 kg/m.  (Class I obesity)   Acute GI bleeding: Eliquis  has been held.  She has been evaluated by gastroenterologist.  Plan for EGD tomorrow.   Acute blood loss anemia: S/p transfusion with 2 units of PRBCs on 08/22/2024.  hemoglobin up to 11.1. Hemoglobin dropped from 9.5-7.2.   AKI: Improved   Paroxysmal atrial fibrillation, history of stroke: Eliquis  on hold.   Left foot fracture: Evaluated by podiatrist.  Conservative management recommended.  Analgesics as needed for  pain.   Comorbidities include hyperlipidemia, GERD, hypertension  Diet Order             DIET SOFT Room service appropriate? Yes; Fluid consistency: Thin  Diet effective now                                  Consultants: Gastroenterologist Podiatrist  Procedures: None    Medications:    amLODipine   10 mg Oral Daily   atorvastatin   20 mg Oral q1800   citalopram   20 mg Oral q AM   levothyroxine  50 mcg Oral Q0200   losartan   50 mg Oral Daily   pantoprazole  (PROTONIX ) IV  40 mg Intravenous Q12H   Continuous Infusions:   Anti-infectives (From admission, onward)    None              Family Communication/Anticipated D/C date and plan/Code Status   DVT prophylaxis: SCDs Start: 08/21/24 1139     Code Status: Limited: Do not attempt resuscitation (DNR) -DNR-LIMITED -Do Not Intubate/DNI   Family Communication: Plan discussed with the husband at the bedside Disposition Plan: Plan to discharge home   Status is: Inpatient Remains inpatient appropriate because: GI bleeding with blood loss anemia       Subjective:   Interval events noted.  Dizziness has improved.  No vomiting, abdominal pain or black stools reported.  She has some pain in the  left foot.  Objective:    Vitals:   08/22/24 2035 08/23/24 0200 08/23/24 0422 08/23/24 0829  BP: (!) 140/50 117/62 (!) 117/43 (!) 147/53  Pulse: (!) 53 (!) 55 60 (!) 51  Resp: 18 20  16   Temp: 98.2 F (36.8 C) 98.5 F (36.9 C) 98.3 F (36.8 C) 97.9 F (36.6 C)  TempSrc:    Oral  SpO2: 96%  98% 96%  Weight:      Height:       No data found.   Intake/Output Summary (Last 24 hours) at 08/23/2024 0956 Last data filed at 08/22/2024 1656 Gross per 24 hour  Intake 642 ml  Output --  Net 642 ml   Filed Weights   08/21/24 1255  Weight: 78 kg    Exam:  GEN: NAD SKIN: Warm and dry EYES: No pallor or icterus ENT: MMM CV: RRR PULM: CTA B ABD: soft, ND, NT, +BS CNS: AAO x 3,  non focal EXT: No edema or tenderness.  Cam Boot on left foot        Data Reviewed:   I have personally reviewed following labs and imaging studies:  Labs: Labs show the following:   Basic Metabolic Panel: Recent Labs  Lab 08/21/24 1025 08/22/24 0538  NA 141 145  K 3.5 3.7  CL 105 114*  CO2 28 28  GLUCOSE 105* 95  BUN 56* 32*  CREATININE 1.44* 1.14*  CALCIUM  9.5 7.9*   GFR Estimated Creatinine Clearance: 36.8 mL/min (A) (by C-G formula based on SCr of 1.14 mg/dL (H)). Liver Function Tests: Recent Labs  Lab 08/21/24 1025  AST 26  ALT 20  ALKPHOS 29*  BILITOT 0.6  PROT 6.4*  ALBUMIN 3.9   No results for input(s): LIPASE, AMYLASE in the last 168 hours. No results for input(s): AMMONIA in the last 168 hours. Coagulation profile Recent Labs  Lab 08/21/24 1025 08/22/24 0538  INR 1.6* 1.2    CBC: Recent Labs  Lab 08/21/24 1025 08/22/24 0538  WBC 9.7 4.4  HGB 9.5* 7.2*  HCT 28.6* 21.9*  MCV 96.3 100.0  PLT 226 140*   Cardiac Enzymes: No results for input(s): CKTOTAL, CKMB, CKMBINDEX, TROPONINI in the last 168 hours. BNP (last 3 results) Recent Labs    01/29/24 1338  PROBNP 242   CBG: No results for input(s): GLUCAP in the last 168 hours. D-Dimer: No results for input(s): DDIMER in the last 72 hours. Hgb A1c: No results for input(s): HGBA1C in the last 72 hours. Lipid Profile: No results for input(s): CHOL, HDL, LDLCALC, TRIG, CHOLHDL, LDLDIRECT in the last 72 hours. Thyroid  function studies: No results for input(s): TSH, T4TOTAL, T3FREE, THYROIDAB in the last 72 hours.  Invalid input(s): FREET3 Anemia work up: No results for input(s): VITAMINB12, FOLATE, FERRITIN, TIBC, IRON, RETICCTPCT in the last 72 hours. Sepsis Labs: Recent Labs  Lab 08/21/24 1025 08/22/24 0538  WBC 9.7 4.4    Microbiology No results found for this or any previous visit (from the past 240  hours).  Procedures and diagnostic studies:  DG Foot Complete Left Result Date: 08/21/2024 CLINICAL DATA:  Clemens. Pain swelling and bruising of the third and fourth toes. EXAM: LEFT FOOT - COMPLETE 3+ VIEW COMPARISON:  None Available. FINDINGS: Small corner fracture involving the base of the middle phalanx of the third toe laterally. Small avulsion fracture also noted along the lateral base of the fourth middle phalanx. No other midfoot forefoot fractures are identified. The lateral film demonstrates a small  avulsion fracture involving the distal dorsal cortex of the talus. IMPRESSION: 1. Small corner fracture involving the base of the middle phalanx of the third toe laterally. 2. Small avulsion fracture involving the lateral base of the fourth middle phalanx. 3. Small avulsion fracture involving the distal dorsal cortex of the talus. Electronically Signed   By: MYRTIS Stammer M.D.   On: 08/21/2024 11:44   DG Ankle 2 Views Left Result Date: 08/21/2024 CLINICAL DATA:  Pain after fall. EXAM: LEFT ANKLE - 2 VIEW COMPARISON:  None Available. FINDINGS: Punctate linear mineralization adjacent to the dorsal talar head/neck may reflect an avulsion fracture. No dislocation. No additional fracture identified. Soft tissue swelling of the anterolateral ankle. IMPRESSION: Findings suspicious for small linear avulsion fracture of the dorsal talar head/neck. Electronically Signed   By: Harrietta Sherry M.D.   On: 08/21/2024 11:17               LOS: 2 days   Waymond Meador  [facility]   Pager on [email or web address]. If 7PM-7AM, please contact night-coverage at [email or web address]     08/23/2024, 9:56 AM

## 2024-08-23 NOTE — Evaluation (Signed)
 Physical Therapy Evaluation Patient Details Name: Emma Rivera MRN: [id number] DOB: December 20, 1940 Today's Date: 08/23/2024  History of Present Illness  Emma Rivera is a pleasant 83 y.o. female with medical history significant for A-fib on Eliquis , history of stroke, HTN, HLD, GERD, depression, hypothyroidism who came into ED from cardiologist office after she was found to have orthostatic hypotension. MD dx include GI bleed, left foot fx.  Clinical Impression  Pt is pleasant 83 y.o. female admitted for GI bleed. Pt is A&Ox4 and agreeable to PT. Short boot donned upon SPT entry. Prior to hospitalization, pt independent with amb and ADLs. Pt lives in split level home with her spouse who is available for assistance if needed. Pt is independent with bed mobility, mod I for transfers, and CGA-sup for amb. Pt able to perform stairs with supervision-CGA for safety using step to pattern. Pt endorsed lightheadedness throughout session, however BP WNL when assessed. Pt demonstrates all bed mobility/transfers/ambulation at baseline level. Pt does not require any further PT needs at this time. Pt will be dc in house and does not require follow up. RN aware. Will dc current orders.         If plan is discharge home, recommend the following: Help with stairs or ramp for entrance   Can travel by private vehicle        Equipment Recommendations None recommended by PT  Recommendations for Other Services       Functional Status Assessment Patient has not had a recent decline in their functional status     Precautions / Restrictions Precautions Precautions: Fall Recall of Precautions/Restrictions: Intact Required Braces or Orthoses: Other Brace Other Brace: short boot for LLE Restrictions Weight Bearing Restrictions Per Provider Order: Yes LLE Weight Bearing Per Provider Order: Weight bearing as tolerated Other Position/Activity Restrictions: WBAT per podiatry note      Mobility  Bed  Mobility Overal bed mobility: Independent             General bed mobility comments: Able to move from supine>sit without assistance. HOB slightly elevated. No use of bed rails.    Transfers Overall transfer level: Modified independent Equipment used: Rolling walker (2 wheels), None               General transfer comment: STS and step pivot transfer using RW. Use of UEs to push up from bed.    Ambulation/Gait Ambulation/Gait assistance: Contact guard assist, Supervision Gait Distance (Feet): 50 Feet Assistive device: Rolling walker (2 wheels), None Gait Pattern/deviations: WFL(Within Functional Limits)       General Gait Details: use of RW at first, however demonstrates good balance/did not rely on RW. Endorsed light headedness after amb. BP assessed and WNLs  Stairs Stairs: Yes Stairs assistance: Contact guard assist, Supervision Stair Management: One rail Left, Step to pattern Number of Stairs: 8 General stair comments: step to pattern to ascend and descend. CGA for descent for safety, and supervision for ascent.  Wheelchair Mobility     Tilt Bed    Modified Rankin (Stroke Patients Only)       Balance Overall balance assessment: Mild deficits observed, not formally tested                                           Pertinent Vitals/Pain Pain Assessment Pain Assessment: No/denies pain    Home Living Family/patient expects to be discharged  to:: Private residence Living Arrangements: Spouse/significant other Available Help at Discharge: Available 24 hours/day Type of Home: House Home Access: Stairs to enter Entrance Stairs-Rails: None Entrance Stairs-Number of Steps: 1 small step Alternate Level Stairs-Number of Steps: 4 steps up to kitchen, ~ 10 to upper level Home Layout: Multi-level;1/2 bath on main level;Bed/bath upstairs Home Equipment: Agricultural consultant (2 wheels);BSC/3in1;Shower seat      Prior Function Prior Level of  Function : Independent/Modified Independent             Mobility Comments: Independent with mobility. 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 Pleasant and agreeable to PT Following commands: Intact       Cueing Cueing Techniques: Verbal cues, Visual cues     General Comments      Exercises     Assessment/Plan    PT Assessment Patient does not need any further PT services  PT Problem List         PT Treatment Interventions      PT Goals (Current goals can be found in the Care Plan section)  Acute Rehab PT Goals Patient Stated Goal: I want to go home PT Goal Formulation: With patient Time For Goal Achievement: 09/06/24 Potential to Achieve Goals: Good    Frequency       Co-evaluation               AM-PAC PT 6 Clicks Mobility  Outcome Measure Help needed turning from your back to your side while in a flat bed without using bedrails?: None Help needed moving from lying on your back to sitting on the side of a flat bed without using bedrails?: None Help needed moving to and from a bed to a chair (including a wheelchair)?: A Little Help needed standing up from a chair using your arms (e.g., wheelchair or bedside chair)?: A Little Help needed to walk in hospital room?: A Little Help needed climbing 3-5 steps with a railing? : A Little 6 Click Score: 20     End of Session Equipment Utilized During Treatment: Gait belt Activity Tolerance: Patient tolerated treatment well Patient left: in chair;with call bell/phone within reach;with chair alarm set Nurse Communication: Mobility status PT Visit Diagnosis: History of falling (Z91.81)    Time: 8891-8861 PT Time Calculation (min) (ACUTE ONLY): 30 min   Charges:                 Sota Hetz, SPT   Treshaun Carrico 08/23/2024, 12:34 PM

## 2024-08-23 NOTE — Progress Notes (Signed)
 OT Screen Note  Patient Details Name: Emma Rivera MRN: [id number] DOB: 1941-02-10   Cancelled Treatment:    Reason Eval/Treat Not Completed: OT screened, no needs identified, will sign off; consult received, chart reviewed, spoke to PT, patient independent with ADLs and no skilled OT needs at this time, will sign off.  [name] Clause 08/23/2024, 12:39 PM

## 2024-08-24 ENCOUNTER — Inpatient Hospital Stay: Admitting: General Practice

## 2024-08-24 ENCOUNTER — Encounter: Payer: Self-pay | Admitting: Hospitalist

## 2024-08-24 ENCOUNTER — Encounter
Admission: EM | Disposition: A | Discharge status: Home / Self Care | Payer: Self-pay | Source: Home / Self Care | Attending: Internal Medicine

## 2024-08-24 DIAGNOSIS — K921 Melena: Secondary | ICD-10-CM | POA: Diagnosis not present

## 2024-08-24 HISTORY — PX: ESOPHAGOGASTRODUODENOSCOPY: SHX5428

## 2024-08-24 SURGERY — EGD (ESOPHAGOGASTRODUODENOSCOPY)
Anesthesia: General

## 2024-08-24 MED ORDER — LIDOCAINE 2% (20 MG/ML) 5 ML SYRINGE
INTRAMUSCULAR | Status: DC | PRN
  Administered 2024-08-24: 20 mg via INTRAVENOUS

## 2024-08-24 MED ORDER — PROPOFOL 10 MG/ML IV BOLUS
INTRAVENOUS | Status: AC
  Filled 2024-08-24: qty 20

## 2024-08-24 MED ORDER — PROPOFOL 10 MG/ML IV BOLUS
INTRAVENOUS | Status: DC | PRN
  Administered 2024-08-24: 30 mg via INTRAVENOUS
  Administered 2024-08-24: 70 mg via INTRAVENOUS

## 2024-08-24 MED ORDER — GLYCOPYRROLATE 0.2 MG/ML IJ SOLN
INTRAMUSCULAR | Status: DC | PRN
  Administered 2024-08-24: .2 mg via INTRAVENOUS

## 2024-08-24 MED ORDER — POLYETHYLENE GLYCOL 3350 17 GM/SCOOP PO POWD
238.0000 g | Freq: Once | ORAL | Status: AC
  Administered 2024-08-24: 238 g via ORAL
  Filled 2024-08-24 (×2): qty 238

## 2024-08-24 NOTE — Plan of Care (Signed)

## 2024-08-24 NOTE — Interval H&P Note (Signed)
 History and Physical Interval Note:  08/24/2024 8:16 AM  Emma Rivera  has presented today for surgery, with the diagnosis of Melena, anemia secondary to gastrointestial blood loss.  The various methods of treatment have been discussed with the patient and family. After consideration of risks, benefits and other options for treatment, the patient has consented to  Procedure(s): EGD (ESOPHAGOGASTRODUODENOSCOPY) (N/A) as a surgical intervention.  The patient's history has been reviewed, patient examined, no change in status, stable for surgery.  I have reviewed the patient's chart and labs.  Questions were answered to the patient's satisfaction.     [place], Yug Loria

## 2024-08-24 NOTE — Anesthesia Postprocedure Evaluation (Signed)
 Anesthesia Post Note  Patient: XIAMARA HULET  Procedure(s) Performed: EGD (ESOPHAGOGASTRODUODENOSCOPY)  Patient location during evaluation: PACU Anesthesia Type: General Level of consciousness: awake and alert Pain management: pain level controlled Vital Signs Assessment: post-procedure vital signs reviewed and stable Respiratory status: spontaneous breathing, nonlabored ventilation, respiratory function stable and patient connected to nasal cannula oxygen Cardiovascular status: blood pressure returned to baseline and stable Postop Assessment: no apparent nausea or vomiting Anesthetic complications: no   No notable events documented.   Last Vitals:  Vitals:   08/24/24 1307 08/24/24 1620  BP: (!) 137/57 (!) 139/58  Pulse: (!) 54 (!) 51  Resp: 16 16  Temp: 36.6 C 36.7 C  SpO2: 97% 93%    Last Pain:  Vitals:   08/24/24 1620  TempSrc: Oral  PainSc:                  Debby Mines

## 2024-08-24 NOTE — Progress Notes (Signed)
 Progress Note    Emma Rivera  FMW:[id number] DOB: Oct 02, 1941  DOA: 08/21/2024 PCP: Emma Amis, MD      Brief Narrative:    Medical records reviewed and are as summarized below:  Emma Rivera is a 83 y.o. female with medical history significant for A-fib on Eliquis , history of stroke, HTN, HLD, GERD, depression, hypothyroidism, who was referred from the cardiologist office to the ED after she was found to have orthostatic hypotension.  She had complained of black-colored stools for about 3 to 4 days prior to admission.  She also had a recent fall and sustained injury to the left foot.   Hemoglobin on admission was 9.5 and it dropped to 7.2 during hospitalization.  BUN and creatinine were 56 and 1.44 respectively.  X-ray left foot: IMPRESSION: 1. Small corner fracture involving the base of the middle phalanx of the third toe laterally. 2. Small avulsion fracture involving the lateral base of the fourth middle phalanx. 3. Small avulsion fracture involving the distal dorsal cortex of the talus.     Assessment/Plan:   Principal Problem:   GI bleeding Active Problems:   Acute blood loss anemia   Foot fracture, left   Paroxysmal atrial fibrillation (HCC)   Essential hypertension   History of stroke   Hyperlipidemia LDL goal <70   GERD without esophagitis   CVA (cerebral vascular accident) (HCC) - L frontal embolic d/t AF s/p IV TPA   Dyspnea on exertion    Body mass index is 31.45 kg/m.  (Class I obesity)   Acute GI bleeding: S/p EGD on 08/24/2024 which showed normal esophagus, single gastric polyp (biopsy) and normal examined duodenum.  Plan for colonoscopy tomorrow. Eliquis  is still on hold.   Acute blood loss anemia: S/p transfusion with 2 units of PRBCs on 08/22/2024.  hemoglobin up to 11.1. Hemoglobin dropped from 9.5-7.2.   AKI: Improved   Paroxysmal atrial fibrillation, history of stroke: Eliquis  on hold.   Left foot fracture: Evaluated by  podiatrist.  Conservative management recommended.  Analgesics as needed for pain.   Comorbidities include hyperlipidemia, GERD, hypertension  Diet Order             Diet clear liquid Room service appropriate? Yes; Fluid consistency: Thin  Diet effective now                                  Consultants: Gastroenterologist Podiatrist  Procedures: EGD on 08/24/2024    Medications:    amLODipine   10 mg Oral Daily   atorvastatin   20 mg Oral q1800   citalopram   20 mg Oral q AM   levothyroxine  50 mcg Oral Q0200   losartan   50 mg Oral Daily   pantoprazole  (PROTONIX ) IV  40 mg Intravenous Q12H   Continuous Infusions:   Anti-infectives (From admission, onward)    None              Family Communication/Anticipated D/C date and plan/Code Status   DVT prophylaxis: SCDs Start: 08/21/24 1139     Code Status: Limited: Do not attempt resuscitation (DNR) -DNR-LIMITED -Do Not Intubate/DNI   Family Communication: Plan discussed with the husband at the bedside Disposition Plan: Plan to discharge home   Status is: Inpatient Remains inpatient appropriate because: GI bleeding with blood loss anemia       Subjective:   Interval events noted.  She complains of constipation.  No  black stools, vomiting or abdominal pain.  Husband at the bedside.  Objective:    Vitals:   08/24/24 0930 08/24/24 1010 08/24/24 1122 08/24/24 1307  BP: (!) 128/55 (!) 144/59 127/65 (!) 137/57  Pulse: (!) 49 (!) 55 (!) 54 (!) 54  Resp: 15 16 15 16   Temp: 98.5 F (36.9 C) 97.8 F (36.6 C) 98.3 F (36.8 C) 97.9 F (36.6 C)  TempSrc: Oral Oral Oral Oral  SpO2: 96% 97% 99% 97%  Weight:      Height:       No data found.   Intake/Output Summary (Last 24 hours) at 08/24/2024 1426 Last data filed at 08/24/2024 9173 Gross per 24 hour  Intake 204.58 ml  Output --  Net 204.58 ml   Filed Weights   08/21/24 1255  Weight: 78 kg    Exam:  GEN: NAD SKIN: Warm  and dry EYES: No pallor or icterus no ENT: MMM CV: RRR PULM: CTA B ABD: soft, ND, NT, +BS CNS: AAO x 3, non focal EXT: Mild swelling and tenderness of the left foot      Data Reviewed:   I have personally reviewed following labs and imaging studies:  Labs: Labs show the following:   Basic Metabolic Panel: Recent Labs  Lab 08/21/24 1025 08/22/24 0538 08/23/24 0911  NA 141 145 142  K 3.5 3.7 3.5  CL 105 114* 109  CO2 28 28 26   GLUCOSE 105* 95 91  BUN 56* 32* 21  CREATININE 1.44* 1.14* 1.06*  CALCIUM  9.5 7.9* 8.3*   GFR Estimated Creatinine Clearance: 39.6 mL/min (A) (by C-G formula based on SCr of 1.06 mg/dL (H)). Liver Function Tests: Recent Labs  Lab 08/21/24 1025  AST 26  ALT 20  ALKPHOS 29*  BILITOT 0.6  PROT 6.4*  ALBUMIN 3.9   No results for input(s): LIPASE, AMYLASE in the last 168 hours. No results for input(s): AMMONIA in the last 168 hours. Coagulation profile Recent Labs  Lab 08/21/24 1025 08/22/24 0538  INR 1.6* 1.2    CBC: Recent Labs  Lab 08/21/24 1025 08/22/24 0538 08/23/24 0911  WBC 9.7 4.4 5.3  NEUTROABS  --   --  3.5  HGB 9.5* 7.2* 11.1*  HCT 28.6* 21.9* 33.4*  MCV 96.3 100.0 94.9  PLT 226 140* 155   Cardiac Enzymes: No results for input(s): CKTOTAL, CKMB, CKMBINDEX, TROPONINI in the last 168 hours. BNP (last 3 results) Recent Labs    01/29/24 1338  PROBNP 242   CBG: No results for input(s): GLUCAP in the last 168 hours. D-Dimer: No results for input(s): DDIMER in the last 72 hours. Hgb A1c: No results for input(s): HGBA1C in the last 72 hours. Lipid Profile: No results for input(s): CHOL, HDL, LDLCALC, TRIG, CHOLHDL, LDLDIRECT in the last 72 hours. Thyroid  function studies: No results for input(s): TSH, T4TOTAL, T3FREE, THYROIDAB in the last 72 hours.  Invalid input(s): FREET3 Anemia work up: No results for input(s): VITAMINB12, FOLATE, FERRITIN, TIBC, IRON,  RETICCTPCT in the last 72 hours. Sepsis Labs: Recent Labs  Lab 08/21/24 1025 08/22/24 0538 08/23/24 0911  WBC 9.7 4.4 5.3    Microbiology No results found for this or any previous visit (from the past 240 hours).  Procedures and diagnostic studies:  No results found.              LOS: 3 days   Emma Rivera  [facility]   Pager on [email or web address]. If 7PM-7AM, please contact night-coverage at [email or web address]  08/24/2024, 2:26 PM

## 2024-08-24 NOTE — Op Note (Signed)
 [facility] Gastroenterology Patient Name: Emma Rivera Procedure Date: 08/24/2024 7:22 AM MRN: [id number] Account #: [id number] Date of Birth: 19-Nov-1940 Admit Type: Inpatient Age: 83 Room: [facility] ENDO ROOM 3 Gender: Female Note Status: Finalized Instrument Name: Barnie GI Scope [id number] Procedure:             Upper GI endoscopy Indications:           Acute post hemorrhagic anemia, Melena Providers:             Dilan Fullenwider K. Aundria MD, MD Referring MD:          Alda Carpen (Referring MD) Medicines:             Propofol  per Anesthesia Complications:         No immediate complications. Estimated blood loss:                         Minimal. Procedure:             Pre-Anesthesia Assessment:                        - The risks and benefits of the procedure and the                         sedation options and risks were discussed with the                         patient. All questions were answered and informed                         consent was obtained.                        - Patient identification and proposed procedure were                         verified prior to the procedure by the nurse. The                         procedure was verified in the procedure room.                        - ASA Grade Assessment: III - A patient with severe                         systemic disease.                        - After reviewing the risks and benefits, the patient                         was deemed in satisfactory condition to undergo the                         procedure.                        After obtaining informed consent, the endoscope was                         passed  under direct vision. Throughout the procedure,                         the patient's blood pressure, pulse, and oxygen                         saturations were monitored continuously. The Endoscope                         was introduced through the mouth, and advanced to the                          third part of duodenum. The upper GI endoscopy was                         accomplished without difficulty. The patient tolerated                         the procedure well. Findings:      The esophagus was normal.      A single 12 mm semi-pedunculated polyp with no bleeding and no stigmata       of recent bleeding was found in the gastric body. Biopsies were taken       with a cold forceps for histology. Estimated blood loss was minimal.      The exam of the stomach was otherwise normal.      The examined duodenum was normal. Impression:            - Normal esophagus.                        - A single gastric polyp. Biopsied.                        - Normal examined duodenum. Recommendation:        - Patient has a contact number available for                         emergencies. The signs and symptoms of potential                         delayed complications were discussed with the patient.                         Return to normal activities tomorrow. Written                         discharge instructions were provided to the patient.                        - Resume previous diet.                        - Continue present medications.                        - Perform a colonoscopy tomorrow.                        - See chart and orders for further recommendations Procedure  Code(s):     --- Professional ---                        [phone], Esophagogastroduodenoscopy, flexible,                         transoral; with biopsy, single or multiple Diagnosis Code(s):     --- Professional ---                        K92.1, Melena (includes Hematochezia)                        D62, Acute posthemorrhagic anemia                        K31.7, Polyp of stomach and duodenum CPT copyright 2022 American Medical Association. All rights reserved. The codes documented in this report are preliminary and upon coder review may  be revised to meet current compliance requirements. Ladell MARLA Boss MD,  MD 08/24/2024 8:35:14 AM This report has been signed electronically. Number of Addenda: 0 Note Initiated On: 08/24/2024 7:22 AM Estimated Blood Loss:  Estimated blood loss was minimal.      [facility]

## 2024-08-24 NOTE — Transfer of Care (Signed)
 Immediate Anesthesia Transfer of Care Note  Patient: Emma Rivera  Procedure(s) Performed: EGD (ESOPHAGOGASTRODUODENOSCOPY)  Patient Location: Endoscopy Unit  Anesthesia Type:General  Level of Consciousness: awake and drowsy  Airway & Oxygen Therapy: Patient Spontanous Breathing and Patient connected to nasal cannula oxygen  Post-op Assessment: Report given to RN and Post -op Vital signs reviewed and stable  Post vital signs: Reviewed  Last Vitals:  Vitals Value Taken Time  BP 103/39 08/24/24 08:32  Temp    Pulse 48 08/24/24 08:34  Resp 13 08/24/24 08:34  SpO2 94 % 08/24/24 08:34  Vitals shown include unfiled device data.  Last Pain:  Vitals:   08/24/24 0745  TempSrc: Temporal  PainSc: 0-No pain         Complications: No notable events documented.

## 2024-08-24 NOTE — Anesthesia Preprocedure Evaluation (Signed)
 Anesthesia Evaluation  Patient identified by MRN, date of birth, ID band Patient awake    Reviewed: Allergy & Precautions, NPO status , Patient's Chart, lab work & pertinent test results  History of Anesthesia Complications Negative for: history of anesthetic complications  Airway Mallampati: III   Neck ROM: Full    Dental no notable dental hx.    Pulmonary asthma    Pulmonary exam normal breath sounds clear to auscultation       Cardiovascular hypertension, Normal cardiovascular exam+ dysrhythmias (a fib, persistent s/p ablation on Eliquis , last dose 10/19/23)  Rhythm:Regular Rate:Normal  ECG 09/24/23: low atrial/ectopic atrial rhythm, with short PR  Echo 03/23/23:  1. Left ventricular ejection fraction, by estimation, is 60 to 65%. The left ventricle has normal function. The left ventricle has no regional wall motion abnormalities. Left ventricular diastolic parameters are consistent with Grade II diastolic dysfunction (pseudonormalization).  2. Right ventricular systolic function is normal. The right ventricular size is normal. There is normal pulmonary artery systolic pressure.  3. Left atrial size was moderately dilated.  4. Right atrial size was mildly dilated.  5. The mitral valve is normal in structure. No evidence of mitral valve regurgitation. No evidence of mitral stenosis.  6. The aortic valve is normal in structure. Aortic valve regurgitation is not visualized. Aortic valve sclerosis/calcification is present, without any evidence of aortic stenosis.  7. The inferior vena cava is normal in size with greater than 50% respiratory variability, suggesting right atrial pressure of 3 mmHg.  Myocardial perfusion 07/18/19:  Pharmacological myocardial perfusion imaging study with no significant  ischemia Normal wall motion, EF estimated at 74% No EKG changes concerning for ischemia at peak stress or in recovery. Low risk scan     Neuro/Psych  PSYCHIATRIC DISORDERS  Depression    HOH CVA (2020), No Residual Symptoms    GI/Hepatic ,GERD  ,,  Endo/Other  Obesity   Renal/GU      Musculoskeletal   Abdominal   Peds  Hematology negative hematology ROS (+)   Anesthesia Other Findings Reviewed and agree with Dorise Boor pre-anesthesia clinical review note.    Cardiology note 10/18/23:  She reported no c/o CP, SOB, some palpitations post ablation,  Was in an ectopic atrial rhythm 59bpm with f/u monitor noting SR  No CAD by CT in 2022  Preserved LVEF by echo 2024  In review of my note, nothing reported to suggest any cardiac clinical changes that would require any additional testing or cardiac contraindication to knee surgery  RCRI score is one (0.9%)   Recommend resuming her OAC as soon as safe as per her surgeon    Per Dr. Inocencio: OK to hold xarelto for three days.   Therefore, based on ACC/AHA guidelines, the patient would be at acceptable risk for the planned procedure without further cardiovascular testing.     Cardiology note 09/24/23:  1. Paroxysmal Afib CHA2DS2Vasc is 7, on Eliquis , appropriately dosed Some palpitations, AFib initially post ablation Ectopic rhythm today Sounds like she is having frequent PACs Plan for a 3 day monitor   2. HTN No change today   3. Secondary hypercoagulable state   Reproductive/Obstetrics                              Anesthesia Physical Anesthesia Plan  ASA: 3  Anesthesia Plan: General   Post-op Pain Management: Minimal or no pain anticipated   Induction: Intravenous  PONV Risk Score  and Plan: 2 and Propofol  infusion and TIVA  Airway Management Planned: Nasal Cannula  Additional Equipment: None  Intra-op Plan:   Post-operative Plan:   Informed Consent: I have reviewed the patients History and Physical, chart, labs and discussed the procedure including the risks, benefits and alternatives for the proposed  anesthesia with the patient or authorized representative who has indicated his/her understanding and acceptance.     Dental advisory given  Plan Discussed with: CRNA and Surgeon  Anesthesia Plan Comments: (Discussed risks of anesthesia with patient, including possibility of difficulty with spontaneous ventilation under anesthesia necessitating airway intervention, PONV, and rare risks such as cardiac or respiratory or neurological events, and allergic reactions. Discussed the role of CRNA in patient's perioperative care. Patient understands.)        Anesthesia Quick Evaluation

## 2024-08-25 ENCOUNTER — Encounter: Payer: Self-pay | Admitting: Hospitalist

## 2024-08-25 ENCOUNTER — Inpatient Hospital Stay: Admitting: Anesthesiology

## 2024-08-25 ENCOUNTER — Encounter
Admission: EM | Disposition: A | Discharge status: Home / Self Care | Payer: Self-pay | Source: Home / Self Care | Attending: Internal Medicine

## 2024-08-25 DIAGNOSIS — K640 First degree hemorrhoids: Secondary | ICD-10-CM | POA: Diagnosis not present

## 2024-08-25 DIAGNOSIS — K5730 Diverticulosis of large intestine without perforation or abscess without bleeding: Secondary | ICD-10-CM

## 2024-08-25 DIAGNOSIS — K921 Melena: Secondary | ICD-10-CM | POA: Diagnosis not present

## 2024-08-25 HISTORY — PX: COLONOSCOPY: SHX5424

## 2024-08-25 SURGERY — COLONOSCOPY
Anesthesia: General

## 2024-08-25 MED ORDER — SODIUM CHLORIDE 0.9 % IV SOLN: INTRAVENOUS | Status: DC

## 2024-08-25 MED ORDER — EPHEDRINE SULFATE-NACL 50-0.9 MG/10ML-% IV SOSY
PREFILLED_SYRINGE | INTRAVENOUS | Status: DC | PRN
  Administered 2024-08-25: 10 mg via INTRAVENOUS

## 2024-08-25 MED ORDER — LIDOCAINE HCL (PF) 2 % IJ SOLN
INTRAMUSCULAR | Status: AC
  Filled 2024-08-25: qty 5

## 2024-08-25 MED ORDER — EPHEDRINE 5 MG/ML INJ
INTRAVENOUS | Status: AC
  Filled 2024-08-25: qty 5

## 2024-08-25 MED ORDER — GLYCOPYRROLATE 0.2 MG/ML IJ SOLN
INTRAMUSCULAR | Status: AC
  Filled 2024-08-25: qty 1

## 2024-08-25 MED ORDER — PROPOFOL 500 MG/50ML IV EMUL
INTRAVENOUS | Status: DC | PRN
  Administered 2024-08-25: 90 ug/kg/min via INTRAVENOUS
  Administered 2024-08-25: 20 mg via INTRAVENOUS

## 2024-08-25 MED ORDER — LIDOCAINE HCL (CARDIAC) PF 100 MG/5ML IV SOSY
PREFILLED_SYRINGE | INTRAVENOUS | Status: DC | PRN
  Administered 2024-08-25: 50 mg via INTRAVENOUS

## 2024-08-25 MED ORDER — MAGNESIUM CITRATE PO SOLN
1.0000 | ORAL | Status: AC
  Administered 2024-08-25: 1 via ORAL
  Filled 2024-08-25: qty 296

## 2024-08-25 MED ORDER — GLYCOPYRROLATE 0.2 MG/ML IJ SOLN
INTRAMUSCULAR | Status: DC | PRN
  Administered 2024-08-25: .2 mg via INTRAVENOUS

## 2024-08-25 NOTE — Progress Notes (Signed)
 Mobility Specialist Progress Note:    08/25/24 1015  Mobility  Activity Ambulated with assistance  Level of Assistance Contact guard assist, steadying assist  Assistive Device None  Distance Ambulated (ft) 40 ft  Range of Motion/Exercises Active;All extremities  LLE Weight Bearing Per Provider Order WBAT  Activity Response Tolerated well  Mobility visit 1 Mobility  Mobility Specialist Start Time (ACUTE ONLY) 0955  Mobility Specialist Stop Time (ACUTE ONLY) 1008  Mobility Specialist Time Calculation (min) (ACUTE ONLY) 13 min   Pt received in bed, boot on LLE. Required CGA to stand and ambulate with no AD. Tolerated well, pt requested ambulation with no AD. LLE WBAT. Left pt sitting EOB, all needs met.  Sherrilee Ditty Mobility Specialist Please contact via Special educational needs teacher or  Rehab office at [phone]

## 2024-08-25 NOTE — Transfer of Care (Signed)
 Immediate Anesthesia Transfer of Care Note  Patient: Emma Rivera  Procedure(s) Performed: COLONOSCOPY  Patient Location: PACU  Anesthesia Type:MAC  Level of Consciousness: sedated  Airway & Oxygen Therapy: Patient Spontanous Breathing  Post-op Assessment: Report given to RN and Post -op Vital signs reviewed and stable  Post vital signs: stable  Last Vitals:  Vitals Value Taken Time  BP 116/53 08/25/24 16:09  Temp 36.1 C 08/25/24 16:09  Pulse 59 08/25/24 16:11  Resp 21 08/25/24 16:11  SpO2 95 % 08/25/24 16:11  Vitals shown include unfiled device data.  Last Pain:  Vitals:   08/25/24 1609  TempSrc: Temporal  PainSc:          Complications: No notable events documented.

## 2024-08-25 NOTE — Care Management Important Message (Signed)
 Important Message  Patient Details  Name: Emma Rivera MRN: [id number] Date of Birth: December 01, 1940   Important Message Given:  Yes - Medicare IM     Rojelio [name] 08/25/2024, 3:38 PM

## 2024-08-25 NOTE — Progress Notes (Signed)
 Pt refused to have bedalarm on while getting bowel prep. Steady on feet, ortho boot on, nonskid sock on.

## 2024-08-25 NOTE — Anesthesia Preprocedure Evaluation (Addendum)
 Anesthesia Evaluation  Patient identified by MRN, date of birth, ID band Patient awake    Reviewed: Allergy & Precautions, H&P , NPO status , Patient's Chart, lab work & pertinent test results  History of Anesthesia Complications Negative for: history of anesthetic complications  Airway Mallampati: II  TM Distance: >3 FB Neck ROM: full    Dental no notable dental hx.    Pulmonary asthma    Pulmonary exam normal breath sounds clear to auscultation       Cardiovascular hypertension, Normal cardiovascular exam+ dysrhythmias (s/p ablation)  Rhythm:Regular Rate:Normal  ECG 09/24/23: low atrial/ectopic atrial rhythm, with short PR  Echo 03/23/23:  1. Left ventricular ejection fraction, by estimation, is 60 to 65%. The left ventricle has normal function. The left ventricle has no regional wall motion abnormalities. Left ventricular diastolic parameters are consistent with Grade II diastolic dysfunction (pseudonormalization).  2. Right ventricular systolic function is normal. The right ventricular size is normal. There is normal pulmonary artery systolic pressure.  3. Left atrial size was moderately dilated.  4. Right atrial size was mildly dilated.  5. The mitral valve is normal in structure. No evidence of mitral valve regurgitation. No evidence of mitral stenosis.  6. The aortic valve is normal in structure. Aortic valve regurgitation is not visualized. Aortic valve sclerosis/calcification is present, without any evidence of aortic stenosis.  7. The inferior vena cava is normal in size with greater than 50% respiratory variability, suggesting right atrial pressure of 3 mmHg.  Myocardial perfusion 07/18/19:  Pharmacological myocardial perfusion imaging study with no significant  ischemia Normal wall motion, EF estimated at 74% No EKG changes concerning for ischemia at peak stress or in recovery. Low risk scan    Neuro/Psych     Depression    HOH CVA, No Residual Symptoms    GI/Hepatic negative GI ROS, Neg liver ROS,,,  Endo/Other  negative endocrine ROS  Obesity   Renal/GU negative Renal ROS  negative genitourinary   Musculoskeletal   Abdominal   Peds  Hematology  (+) Blood dyscrasia, anemia   Anesthesia Other Findings Past Medical History: 12/16/2018: Acute ischemic left MCA stroke (HCC)     Comment:  a.) CTA head/neck showed emergent M2 occlusion; CT head               negative --> neurology consulted and recommended TPA and               transfer to [facility] for possible thrombectomy; b.) repeat CT              at [facility] revealed interval resolution of M2 occlusion -->               admitted; c.) MRI brain 12/17/2018: acute LEFT MCA               infarct with 3 punctate areas of restricted diffusion               (largest area involved the LEFT insular cortex) 12/16/2018: Atrial fibrillation (HCC)     Comment:  a.) new onset 12/16/2018; b.) CHA2DS2-VASc = 6 (age x2,               sex, HTN, CVA x2) as of 10/19/2023; c.) s/p DCCV (120 J x              1) with ERAF 04/01/2019; d.) s/p ablation 06/13/2019; e.)              s/p DCCV (150  J x1) 03/12/2023; f.) s/p repeat ablation               06/21/2023; g.) cardiac rate/rhythm currently maintained               instrinsically without pharmacological intervention;               chronically anticoagulated using standard dose apixaban  No date: Baker's cyst of knee, left No date: Cerebral microvascular disease No date: DDD (degenerative disc disease), cervical No date: DDD (degenerative disc disease), lumbar No date: Depression 03/23/2023: Diastolic dysfunction     Comment:  a.) TTE 03/23/2023: EF 60-65%, no RWMAs, G2DD, RVSF/RVSP              normal, mod LAE, mild RAE, AoV sclerosis with no stenosis No date: Diverticulosis No date: Esophageal spasm No date: Essential hypertension No date: GERD (gastroesophageal reflux disease) No date: Heart  murmur 2016: History of bilateral cataract extraction No date: History of kidney stones No date: History of stroke involving cerebellum     Comment:  a.) date of neurovascular event unknown; infarct of the               posterior inferior LEFT cerebellum noted on MRI brain               done on 12/17/2018 performed in setting of acute LEFT MCA              infarct No date: HOH (hard of hearing) No date: Hyperlipidemia No date: Moderate persistent asthma 01/21/2019: NSVT (nonsustained ventricular tachycardia) (HCC)     Comment:  a.) Zio 01/21/2019: 2 runs NSVT lasting up to 18 beats               at max rate of 174 bpm;  up to 4.6 second               post-termination pauses No date: On apixaban  therapy No date: Osteoarthritis No date: Prediabetes 10/09/2023: PSVT (paroxysmal supraventricular tachycardia)     Comment:  a.) Zio 10/09/2023: 37 runs each lasting < 17 seconds               (max rate 210 bpm) No date: Sleep apnea  Past Surgical History: 06/13/2019: ATRIAL FIBRILLATION ABLATION; N/A     Comment:  Procedure: ATRIAL FIBRILLATION ABLATION;  Surgeon:               Inocencio Soyla Lunger, MD;  Location: [facility] INVASIVE CV LAB;               Service: Cardiovascular;  Laterality: N/A; 06/21/2023: ATRIAL FIBRILLATION ABLATION; N/A     Comment:  Procedure: ATRIAL FIBRILLATION ABLATION;  Surgeon:               Inocencio Soyla Lunger, MD;  Location: [facility] INVASIVE CV LAB;               Service: Cardiovascular;  Laterality: N/A; No date: CARDIAC CATHETERIZATION 04/01/2019: CARDIOVERSION; N/A     Comment:  Procedure: CARDIOVERSION;  Surgeon: Maranda Leim DEL,               MD;  Location: [facility] ENDOSCOPY;  Service: Cardiovascular;                Laterality: N/A; 03/12/2023: CARDIOVERSION; N/A     Comment:  Procedure: CARDIOVERSION;  Surgeon: Hobart Powell BRAVO, MD;  Location: [facility]  INVASIVE CV LAB;  Service:               Cardiovascular;  Laterality: N/A; 09/09/2015: CATARACT  EXTRACTION W/PHACO; Left     Comment:  Procedure: CATARACT EXTRACTION PHACO AND INTRAOCULAR               LENS PLACEMENT (IOC);  Surgeon: Newell Ovens, MD;                Location: [facility] ORS;  Service: Ophthalmology;  Laterality:              Left;  US    1:10.5 AP     8.6 CDE  6.05 casette lot #               [id number] 09/30/2015: CATARACT EXTRACTION W/PHACO; Right     Comment:  Procedure: CATARACT EXTRACTION PHACO AND INTRAOCULAR               LENS PLACEMENT (IOC);  Surgeon: Newell Ovens, MD;                Location: [facility] ORS;  Service: Ophthalmology;  Laterality:              Right;  US  01:00.3 AP%: 11.6 CDE: 7.00 FLUID LOT#               [id number] 01/02/2007: COLONOSCOPY 04/04/2017: COLONOSCOPY WITH PROPOFOL ; N/A     Comment:  Procedure: COLONOSCOPY WITH PROPOFOL ;  Surgeon: Viktoria Lamar DASEN, MD;  Location: [facility] ENDOSCOPY;  Service:               Endoscopy;  Laterality: N/A; 10/09/2007: ESOPHAGOGASTRODUODENOSCOPY 08/24/2024: ESOPHAGOGASTRODUODENOSCOPY; N/A     Comment:  Procedure: EGD (ESOPHAGOGASTRODUODENOSCOPY);  Surgeon:               Toledo, Ladell POUR, MD;  Location: [facility] ENDOSCOPY;                Service: Gastroenterology;  Laterality: N/A; No date: TONSILLECTOMY 10/23/2023: TOTAL KNEE ARTHROPLASTY; Left     Comment:  Procedure: TOTAL KNEE ARTHROPLASTY;  Surgeon: Edie Norleen PARAS, MD;  Location: [facility] ORS;  Service: Orthopedics;                Laterality: Left;  BMI    Body Mass Index: 31.45 kg/m      Reproductive/Obstetrics negative OB ROS                              Anesthesia Physical Anesthesia Plan  ASA: 3  Anesthesia Plan: General   Post-op Pain Management: Minimal or no pain anticipated   Induction: Intravenous  PONV Risk Score and Plan: 2 and Propofol  infusion and TIVA  Airway Management Planned: Nasal Cannula and Natural Airway  Additional Equipment: None  Intra-op Plan:   Post-operative  Plan:   Informed Consent: I have reviewed the patients History and Physical, chart, labs and discussed the procedure including the risks, benefits and alternatives for the proposed anesthesia with the patient or authorized representative who has indicated his/her understanding and acceptance.   Patient has DNR.  Discussed DNR with patient and Suspend DNR.   Dental Advisory Given  Plan Discussed with: CRNA and Surgeon  Anesthesia Plan Comments:          Anesthesia Quick Evaluation

## 2024-08-25 NOTE — Op Note (Signed)
 [facility] Gastroenterology Patient Name: Emma Rivera Procedure Date: 08/25/2024 3:38 PM MRN: [id number] Account #: [id number] Date of Birth: 11-21-1940 Admit Type: Outpatient Age: 83 Room: [facility] ENDO ROOM 4 Gender: Female Note Status: Finalized Instrument Name: Colon Scope [id number] Procedure:             Colonoscopy Indications:           Melena Providers:             Rogelia Copping MD, MD Referring MD:          Alda Carpen (Referring MD) Medicines:             Propofol  per Anesthesia Complications:         No immediate complications. Procedure:             Pre-Anesthesia Assessment:                        - Prior to the procedure, a History and Physical was                         performed, and patient medications and allergies were                         reviewed. The patient's tolerance of previous                         anesthesia was also reviewed. The risks and benefits                         of the procedure and the sedation options and risks                         were discussed with the patient. All questions were                         answered, and informed consent was obtained. Prior                         Anticoagulants: The patient has taken no anticoagulant                         or antiplatelet agents. ASA Grade Assessment: II - A                         patient with mild systemic disease. After reviewing                         the risks and benefits, the patient was deemed in                         satisfactory condition to undergo the procedure.                        After obtaining informed consent, the colonoscope was                         passed under direct vision. Throughout the procedure,  the patient's blood pressure, pulse, and oxygen                         saturations were monitored continuously. The                         Colonoscope was introduced through the anus and                          advanced to the the cecum, identified by appendiceal                         orifice and ileocecal valve. The colonoscopy was                         performed without difficulty. The patient tolerated                         the procedure well. The quality of the bowel                         preparation was excellent. Findings:      The perianal and digital rectal examinations were normal.      Multiple small-mouthed diverticula were found in the sigmoid colon.      Non-bleeding internal hemorrhoids were found during retroflexion. The       hemorrhoids were Grade I (internal hemorrhoids that do not prolapse). Impression:            - Diverticulosis in the sigmoid colon.                        - Non-bleeding internal hemorrhoids.                        - No specimens collected. Recommendation:        - Discharge patient to home.                        - Resume previous diet.                        - Continue present medications.                        - To visualize the small bowel, perform video capsule                         endoscopy today. Procedure Code(s):     --- Professional ---                        [phone], Colonoscopy, flexible; diagnostic, including                         collection of specimen(s) by brushing or washing, when                         performed (separate procedure) Diagnosis Code(s):     --- Professional ---  K92.1, Melena (includes Hematochezia) CPT copyright 2022 American Medical Association. All rights reserved. The codes documented in this report are preliminary and upon coder review may  be revised to meet current compliance requirements. Rogelia Copping MD, MD 08/25/2024 4:06:03 PM This report has been signed electronically. Number of Addenda: 0 Note Initiated On: 08/25/2024 3:38 PM Scope Withdrawal Time: 0 hours 6 minutes 17 seconds  Total Procedure Duration: 0 hours 12 minutes 30 seconds  Estimated Blood Loss:  Estimated blood  loss: none.      [facility]

## 2024-08-25 NOTE — Progress Notes (Signed)
 Progress Note    Emma Rivera  FMW:[id number] DOB: 1941/11/13  DOA: 08/21/2024 PCP: Alla Amis, MD      Brief Narrative:    Medical records reviewed and are as summarized below:  Emma Rivera is a 83 y.o. female with medical history significant for A-fib on Eliquis , history of stroke, HTN, HLD, GERD, depression, hypothyroidism, who was referred from the cardiologist office to the ED after she was found to have orthostatic hypotension.  She had complained of black-colored stools for about 3 to 4 days prior to admission.  She also had a recent fall and sustained injury to the left foot.   Hemoglobin on admission was 9.5 and it dropped to 7.2 during hospitalization.  BUN and creatinine were 56 and 1.44 respectively.  X-ray left foot: IMPRESSION: 1. Small corner fracture involving the base of the middle phalanx of the third toe laterally. 2. Small avulsion fracture involving the lateral base of the fourth middle phalanx. 3. Small avulsion fracture involving the distal dorsal cortex of the talus.     Assessment/Plan:   Principal Problem:   GI bleeding Active Problems:   Acute blood loss anemia   Foot fracture, left   Paroxysmal atrial fibrillation (HCC)   Essential hypertension   History of stroke   Hyperlipidemia LDL goal <70   GERD without esophagitis   CVA (cerebral vascular accident) (HCC) - L frontal embolic d/t AF s/p IV TPA   Dyspnea on exertion    Body mass index is 31.45 kg/m.  (Class I obesity)   Acute GI bleeding: S/p EGD on 08/24/2024 which showed normal esophagus, single gastric polyp (biopsy) and normal examined duodenum.  S/p colonoscopy on 08/25/2024 which showed diverticulosis in the sigmoid colon and nonbleeding internal hemorrhoids. Plan for video capsule endoscopy today.  Follow-up with gastroenterologist for further recommendations. Eliquis  is still on hold.   Acute blood loss anemia: S/p transfusion with 2 units of PRBCs on  08/22/2024.  hemoglobin up to 11.1. Hemoglobin dropped from 9.5-7.2.   AKI: Improved   Paroxysmal atrial fibrillation, history of stroke: Eliquis  on hold.   Left foot fracture: Evaluated by podiatrist.  Conservative management recommended.  Analgesics as needed for pain.   Comorbidities include hyperlipidemia, GERD, hypertension  Diet Order             Diet NPO time specified  Diet effective midnight                                  Consultants: Gastroenterologist Podiatrist  Procedures: EGD on 08/24/2024    Medications:    amLODipine   10 mg Oral Daily   atorvastatin   20 mg Oral q1800   citalopram   20 mg Oral q AM   levothyroxine  50 mcg Oral Q0200   losartan   50 mg Oral Daily   pantoprazole  (PROTONIX ) IV  40 mg Intravenous Q12H   Continuous Infusions:   Anti-infectives (From admission, onward)    None              Family Communication/Anticipated D/C date and plan/Code Status   DVT prophylaxis: SCDs Start: 08/21/24 1139     Code Status: Limited: Do not attempt resuscitation (DNR) -DNR-LIMITED -Do Not Intubate/DNI   Family Communication: Plan discussed with Angie, daughter, at the bedside Disposition Plan: Plan to discharge home   Status is: Inpatient Remains inpatient appropriate because: GI bleeding with blood loss anemia  Subjective:   Interval events noted.  She had diarrhea overnight from colon prep.  No bloody stools.  She was weak from colon prep.  Angie, daughter, was at the bedside  Objective:    Vitals:   08/24/24 1620 08/24/24 1946 08/25/24 0302 08/25/24 0807  BP: (!) 139/58 (!) 147/60 (!) 142/59 (!) 132/56  Pulse: (!) 51 (!) 55 (!) 47 (!) 53  Resp: 16 16 16 16   Temp: 98.1 F (36.7 C) 98.3 F (36.8 C) 97.6 F (36.4 C) 97.8 F (36.6 C)  TempSrc: Oral Oral  Oral  SpO2: 93% 96% 100% 100%  Weight:      Height:       No data found.   Intake/Output Summary (Last 24 hours) at 08/25/2024  1058 Last data filed at 08/24/2024 1900 Gross per 24 hour  Intake 440 ml  Output --  Net 440 ml   Filed Weights   08/21/24 1255  Weight: 78 kg    Exam:  GEN: NAD SKIN: Warm and dry EYES: No pallor or icterus ENT: MMM CV: RRR PULM: CTA B ABD: soft, ND, NT, +BS CNS: AAO x 3, non focal EXT: Mild swelling and tenderness of left foot    Data Reviewed:   I have personally reviewed following labs and imaging studies:  Labs: Labs show the following:   Basic Metabolic Panel: Recent Labs  Lab 08/21/24 1025 08/22/24 0538 08/23/24 0911  NA 141 145 142  K 3.5 3.7 3.5  CL 105 114* 109  CO2 28 28 26   GLUCOSE 105* 95 91  BUN 56* 32* 21  CREATININE 1.44* 1.14* 1.06*  CALCIUM  9.5 7.9* 8.3*   GFR Estimated Creatinine Clearance: 39.6 mL/min (A) (by C-G formula based on SCr of 1.06 mg/dL (H)). Liver Function Tests: Recent Labs  Lab 08/21/24 1025  AST 26  ALT 20  ALKPHOS 29*  BILITOT 0.6  PROT 6.4*  ALBUMIN 3.9   No results for input(s): LIPASE, AMYLASE in the last 168 hours. No results for input(s): AMMONIA in the last 168 hours. Coagulation profile Recent Labs  Lab 08/21/24 1025 08/22/24 0538  INR 1.6* 1.2    CBC: Recent Labs  Lab 08/21/24 1025 08/22/24 0538 08/23/24 0911  WBC 9.7 4.4 5.3  NEUTROABS  --   --  3.5  HGB 9.5* 7.2* 11.1*  HCT 28.6* 21.9* 33.4*  MCV 96.3 100.0 94.9  PLT 226 140* 155   Cardiac Enzymes: No results for input(s): CKTOTAL, CKMB, CKMBINDEX, TROPONINI in the last 168 hours. BNP (last 3 results) Recent Labs    01/29/24 1338  PROBNP 242   CBG: No results for input(s): GLUCAP in the last 168 hours. D-Dimer: No results for input(s): DDIMER in the last 72 hours. Hgb A1c: No results for input(s): HGBA1C in the last 72 hours. Lipid Profile: No results for input(s): CHOL, HDL, LDLCALC, TRIG, CHOLHDL, LDLDIRECT in the last 72 hours. Thyroid  function studies: No results for input(s): TSH,  T4TOTAL, T3FREE, THYROIDAB in the last 72 hours.  Invalid input(s): FREET3 Anemia work up: No results for input(s): VITAMINB12, FOLATE, FERRITIN, TIBC, IRON, RETICCTPCT in the last 72 hours. Sepsis Labs: Recent Labs  Lab 08/21/24 1025 08/22/24 0538 08/23/24 0911  WBC 9.7 4.4 5.3    Microbiology No results found for this or any previous visit (from the past 240 hours).  Procedures and diagnostic studies:  No results found.              LOS: 4 days  Tauriel Scronce  [facility] [employer] loss adjuster on [email or web address]. If 7PM-7AM, please contact night-coverage at [email or web address]     08/25/2024, 10:58 AM

## 2024-08-26 ENCOUNTER — Encounter: Payer: Self-pay | Admitting: Gastroenterology

## 2024-08-26 DIAGNOSIS — K921 Melena: Secondary | ICD-10-CM | POA: Diagnosis not present

## 2024-08-26 LAB — CBC
HCT: 29.7 % — ABNORMAL LOW (ref 36.0–46.0)
Hemoglobin: 10 g/dL — ABNORMAL LOW (ref 12.0–15.0)
MCH: 32.3 pg (ref 26.0–34.0)
MCHC: 33.7 g/dL (ref 30.0–36.0)
MCV: 95.8 fL (ref 80.0–100.0)
Platelets: 144 K/uL — ABNORMAL LOW (ref 150–400)
RBC: 3.1 MIL/uL — ABNORMAL LOW (ref 3.87–5.11)
RDW: 14.6 % (ref 11.5–15.5)
WBC: 3.8 K/uL — ABNORMAL LOW (ref 4.0–10.5)
nRBC: 0 % (ref 0.0–0.2)

## 2024-08-26 LAB — RENAL FUNCTION PANEL
Albumin: 2.9 g/dL — ABNORMAL LOW (ref 3.5–5.0)
Anion gap: 6 (ref 5–15)
BUN: 12 mg/dL (ref 8–23)
CO2: 23 mmol/L (ref 22–32)
Calcium: 8.2 mg/dL — ABNORMAL LOW (ref 8.9–10.3)
Chloride: 112 mmol/L — ABNORMAL HIGH (ref 98–111)
Creatinine, Ser: 1.2 mg/dL — ABNORMAL HIGH (ref 0.44–1.00)
GFR, Estimated: 45 mL/min — ABNORMAL LOW (ref >60)
Glucose, Bld: 79 mg/dL (ref 70–99)
Phosphorus: 3.7 mg/dL (ref 2.5–4.6)
Potassium: 3.9 mmol/L (ref 3.5–5.1)
Sodium: 141 mmol/L (ref 135–145)

## 2024-08-26 LAB — SURGICAL PATHOLOGY

## 2024-08-26 LAB — MAGNESIUM: Magnesium: 2.3 mg/dL (ref 1.7–2.4)

## 2024-08-26 NOTE — Progress Notes (Signed)
 08/26/2024 1:22 PM -----------------------------------------------------------[facility]--------------------------------------------------- D(Data) A(Action) R(response)     Data: Discharge Readiness Assessment EDD today    Action: Chart reviewed    Response: No immediate Barriers to discharge identified at this time.     Biran Mayberry, RN [employer]

## 2024-08-26 NOTE — Plan of Care (Signed)

## 2024-08-26 NOTE — Plan of Care (Signed)

## 2024-08-26 NOTE — Progress Notes (Signed)
 Mobility Specialist - Progress Note  Post-mobility: HR 60, SPO2 96%   08/26/24 0928  Mobility  Activity Ambulated with assistance  Level of Assistance Standby assist, set-up cues, supervision of patient - no hands on  Assistive Device None  Distance Ambulated (ft) 160 ft  LLE Weight Bearing Per Provider Order WBAT  Activity Response Tolerated well  Mobility visit 1 Mobility  Mobility Specialist Start Time (ACUTE ONLY) 0913  Mobility Specialist Stop Time (ACUTE ONLY) 0920  Mobility Specialist Time Calculation (min) (ACUTE ONLY) 7 min   Pt supine upon entry, utilizing RA. Pt amb one lap around the NS w/ sup, tolerated well. Pt endorsed mild dyspnea upon return to the room, O2 >90%. Pt amb to/from the bathroom and left seated in the recliner with needs within reach.  America Silvan Mobility Specialist 08/26/24 9:36 AM

## 2024-08-26 NOTE — Discharge Summary (Signed)
 Physician Discharge Summary   Patient: Emma Rivera MRN: [id number] DOB: 01-14-1941  Admit date:     08/21/2024  Discharge date: 08/26/24  Discharge Physician: [name]   PCP: Alla Amis, MD   Recommendations at discharge:   Follow-up with PCP in 1 to 2 weeks Follow-up with [facility] gastroenterology team in 2 weeks  Discharge Diagnoses: Principal Problem:   GI bleeding Active Problems:   Acute blood loss anemia   Foot fracture, left   Paroxysmal atrial fibrillation (HCC)   Essential hypertension   History of stroke   Hyperlipidemia LDL goal <70   GERD without esophagitis   CVA (cerebral vascular accident) (HCC) - L frontal embolic d/t AF s/p IV TPA   Dyspnea on exertion  Resolved Problems:   * No resolved hospital problems. [facility] Course:  Emma Rivera is a 83 y.o. female with medical history significant for A-fib on Eliquis , history of stroke, HTN, HLD, GERD, depression, hypothyroidism, who was referred from the cardiologist office to the ED after she was found to have orthostatic hypotension.  She had complained of black-colored stools for about 3 to 4 days prior to admission.  She also had a recent fall and sustained injury to the left foot.     Hemoglobin on admission was 9.5 and it dropped to 7.2 during hospitalization.  BUN and creatinine were 56 and 1.44 respectively.   X-ray left foot: IMPRESSION: 1. Small corner fracture involving the base of the middle phalanx of the third toe laterally. 2. Small avulsion fracture involving the lateral base of the fourth middle phalanx. 3. Small avulsion fracture involving the distal dorsal cortex of the talus.       Assessment and Plan:   Acute GI bleeding: Resolved.  S/p EGD on 08/24/2024 which showed normal esophagus, single gastric polyp (biopsy) and normal examined duodenum.  S/p colonoscopy on 08/25/2024 which showed diverticulosis in the sigmoid colon and nonbleeding internal hemorrhoids. S/p  video capsule endoscopy on 08/25/2024.  I was informed by Dr. Jinny that the battery ran out at 13 hours which is normal but was not enough time for the capsule to see the entire small bowel. There was no active bleeding or old blood seen in the small bowel. Outpatient follow-up with gastroenterologist was recommended.    Acute blood loss anemia: S/p transfusion with 2 units of PRBCs on 08/22/2024.  H&H stable.  Hemoglobin down from 11.1-10.  Hemoglobin dropped from 9.5-7.2 current blood transfusion.     AKI: Improved     Paroxysmal atrial fibrillation, history of stroke: Discussed stroke prophylaxis with Eliquis .  Discussed risks and benefits of continued anticoagulation with Eliquis .  Patient has elected to discontinue Eliquis .  She understands that she is at increased risk for stroke.     Left foot fracture: Evaluated by podiatrist.  Conservative management recommended.  Analgesics as needed for pain.  She can follow-up with podiatrist as needed.     Comorbidities include hyperlipidemia, GERD, hypertension, asthma   Her condition has improved and she is deemed stable for discharge to home today.  Discharge plan was discussed with patient, her husband and [name] (daughter) at the bedside.       Consultants: Gastroenterologist Procedures performed: EGD, colonoscopy and video capsule endoscopy Disposition: Home Diet recommendation:  Discharge Diet Orders (From admission, onward)     Start     Ordered   08/26/24 0000  Diet - low sodium heart healthy        08/26/24 1242  Cardiac diet DISCHARGE MEDICATION: Allergies as of 08/26/2024       Reactions   Imdur  [isosorbide  Nitrate] Other (See Comments)   Hair loss   Isosorbide  Dinitrate Other (See Comments)   HA with imdur     Hair loss   Sulfa Antibiotics Nausea Only        Medication List     STOP taking these medications    apixaban  5 MG Tabs tablet Commonly known as: ELIQUIS        TAKE these  medications    acetaminophen  500 MG tablet Commonly known as: TYLENOL  Take 1,000 mg by mouth every 6 (six) hours as needed for moderate pain or headache.   albuterol  108 (90 Base) MCG/ACT inhaler Commonly known as: VENTOLIN  HFA Inhale 1-2 puffs into the lungs every 6 (six) hours as needed (exercise/wheezing/shortness of breath.).   amLODipine  10 MG tablet Commonly known as: NORVASC  Take 10 mg by mouth daily.   atorvastatin  20 MG tablet Commonly known as: LIPITOR TAKE 1 TABLET (20 MG TOTAL) BY MOUTH DAILY AT 6 PM.   CALCIUM -D PO Take 2 tablets by mouth daily.   chlorhexidine  0.12 % solution Commonly known as: PERIDEX  Use as directed 15 mLs in the mouth or throat 2 (two) times daily.   citalopram  20 MG tablet Commonly known as: CELEXA  Take 20 mg by mouth in the morning.   EPINEPHrine  0.3 mg/0.3 mL Soaj injection Commonly known as: EPI-PEN Inject 0.3 mg into the muscle as needed.   Fasenra Pen 30 MG/ML prefilled autoinjector Generic drug: benralizumab Inject 30 mg into the skin every 30 (thirty) days.   hydrochlorothiazide  25 MG tablet Commonly known as: HYDRODIURIL  Take 25 mg by mouth in the morning.   levothyroxine 50 MCG tablet Commonly known as: SYNTHROID Take 50 mcg by mouth daily at 2 am.   loratadine  10 MG tablet Commonly known as: CLARITIN  Take 10 mg by mouth daily as needed for allergies.   losartan  50 MG tablet Commonly known as: COZAAR  Take 50 mg by mouth daily.   LUBRICATING EYE DROPS OP Place 1 drop into both eyes daily as needed (dry eyes).   montelukast 10 MG tablet Commonly known as: SINGULAIR Take 10 mg by mouth at bedtime.   multivitamin tablet Take 1 tablet by mouth in the morning.   omeprazole 20 MG capsule Commonly known as: PRILOSEC Take 20 mg by mouth daily before breakfast.   predniSONE 5 MG tablet Commonly known as: DELTASONE Take 5 mg by mouth daily at 2 am.   tolterodine 2 MG tablet Commonly known as: DETROL Take 2 mg by  mouth 2 (two) times daily.   torsemide  10 MG tablet Commonly known as: DEMADEX  Take 1 tablet (10 mg total) by mouth 3 (three) times a week. Patient only takes 3        Discharge Exam: Filed Weights   08/21/24 1255  Weight: 78 kg   GEN: NAD SKIN: Warm and dry EYES: No pallor or icterus ENT: MMM CV: RRR PULM: CTA B ABD: soft, ND, NT, +BS CNS: AAO x 3, non focal EXT: Mild swelling and tenderness of left foot   Condition at discharge: good  The results of significant diagnostics from this hospitalization (including imaging, microbiology, ancillary and laboratory) are listed below for reference.   Imaging Studies: DG Foot Complete Left Result Date: 08/21/2024 CLINICAL DATA:  Clemens. Pain swelling and bruising of the third and fourth toes. EXAM: LEFT FOOT - COMPLETE 3+ VIEW COMPARISON:  None Available. FINDINGS: Small  corner fracture involving the base of the middle phalanx of the third toe laterally. Small avulsion fracture also noted along the lateral base of the fourth middle phalanx. No other midfoot forefoot fractures are identified. The lateral film demonstrates a small avulsion fracture involving the distal dorsal cortex of the talus. IMPRESSION: 1. Small corner fracture involving the base of the middle phalanx of the third toe laterally. 2. Small avulsion fracture involving the lateral base of the fourth middle phalanx. 3. Small avulsion fracture involving the distal dorsal cortex of the talus. Electronically Signed   By: MYRTIS Stammer M.D.   On: 08/21/2024 11:44   DG Ankle 2 Views Left Result Date: 08/21/2024 CLINICAL DATA:  Pain after fall. EXAM: LEFT ANKLE - 2 VIEW COMPARISON:  None Available. FINDINGS: Punctate linear mineralization adjacent to the dorsal talar head/neck may reflect an avulsion fracture. No dislocation. No additional fracture identified. Soft tissue swelling of the anterolateral ankle. IMPRESSION: Findings suspicious for small linear avulsion fracture of the  dorsal talar head/neck. Electronically Signed   By: Harrietta Sherry M.D.   On: 08/21/2024 11:17    Microbiology: Results for orders placed or performed during the hospital encounter of 02/21/24  Resp panel by RT-PCR (RSV, Flu A&B, Covid) Anterior Nasal Swab     Status: None   Collection Time: 02/21/24 10:35 PM   Specimen: Anterior Nasal Swab  Result Value Ref Range Status   SARS Coronavirus 2 by RT PCR NEGATIVE NEGATIVE Final    Comment: (NOTE) SARS-CoV-2 target nucleic acids are NOT DETECTED.  The SARS-CoV-2 RNA is generally detectable in upper respiratory specimens during the acute phase of infection. The lowest concentration of SARS-CoV-2 viral copies this assay can detect is 138 copies/mL. A negative result does not preclude SARS-Cov-2 infection and should not be used as the sole basis for treatment or other patient management decisions. A negative result may occur with  improper specimen collection/handling, submission of specimen other than nasopharyngeal swab, presence of viral mutation(s) within the areas targeted by this assay, and inadequate number of viral copies(<138 copies/mL). A negative result must be combined with clinical observations, patient history, and epidemiological information. The expected result is Negative.  Fact Sheet for Patients:  BloggerCourse.com  Fact Sheet for Healthcare Providers:  SeriousBroker.it  This test is no t yet approved or cleared by the [place]  FDA and  has been authorized for detection and/or diagnosis of SARS-CoV-2 by FDA under an Emergency Use Authorization (EUA). This EUA will remain  in effect (meaning this test can be used) for the duration of the COVID-19 declaration under Section 564(b)(1) of the Act, 21 U.S.C.section 360bbb-3(b)(1), unless the authorization is terminated  or revoked sooner.       Influenza A by PCR NEGATIVE NEGATIVE Final   Influenza B by PCR  NEGATIVE NEGATIVE Final    Comment: (NOTE) The Xpert Xpress SARS-CoV-2/FLU/RSV plus assay is intended as an aid in the diagnosis of influenza from Nasopharyngeal swab specimens and should not be used as a sole basis for treatment. Nasal washings and aspirates are unacceptable for Xpert Xpress SARS-CoV-2/FLU/RSV testing.  Fact Sheet for Patients: BloggerCourse.com  Fact Sheet for Healthcare Providers: SeriousBroker.it  This test is not yet approved or cleared by the [place]  FDA and has been authorized for detection and/or diagnosis of SARS-CoV-2 by FDA under an Emergency Use Authorization (EUA). This EUA will remain in effect (meaning this test can be used) for the duration of the COVID-19 declaration under Section 564(b)(1) of the  Act, 21 U.S.C. section 360bbb-3(b)(1), unless the authorization is terminated or revoked.     Resp Syncytial Virus by PCR NEGATIVE NEGATIVE Final    Comment: (NOTE) Fact Sheet for Patients: BloggerCourse.com  Fact Sheet for Healthcare Providers: SeriousBroker.it  This test is not yet approved or cleared by the [place]  FDA and has been authorized for detection and/or diagnosis of SARS-CoV-2 by FDA under an Emergency Use Authorization (EUA). This EUA will remain in effect (meaning this test can be used) for the duration of the COVID-19 declaration under Section 564(b)(1) of the Act, 21 U.S.C. section 360bbb-3(b)(1), unless the authorization is terminated or revoked.  Performed at [facility], [address] [place], [place] [address]     Labs: CBC: Recent Labs  Lab 08/21/24 1025 08/22/24 0538 08/23/24 0911 08/26/24 0328  WBC 9.7 4.4 5.3 3.8*  NEUTROABS  --   --  3.5  --   HGB 9.5* 7.2* 11.1* 10.0*  HCT 28.6* 21.9* 33.4* 29.7*  MCV 96.3 100.0 94.9 95.8  PLT 226 140* 155 144*   Basic Metabolic Panel: Recent Labs   Lab 08/21/24 1025 08/22/24 0538 08/23/24 0911 08/26/24 0328  NA 141 145 142 141  K 3.5 3.7 3.5 3.9  CL 105 114* 109 112*  CO2 28 28 26 23   GLUCOSE 105* 95 91 79  BUN 56* 32* 21 12  CREATININE 1.44* 1.14* 1.06* 1.20*  CALCIUM  9.5 7.9* 8.3* 8.2*  MG  --   --   --  2.3  PHOS  --   --   --  3.7   Liver Function Tests: Recent Labs  Lab 08/21/24 1025 08/26/24 0328  AST 26  --   ALT 20  --   ALKPHOS 29*  --   BILITOT 0.6  --   PROT 6.4*  --   ALBUMIN 3.9 2.9*   CBG: No results for input(s): GLUCAP in the last 168 hours.  Discharge time spent: greater than 30 minutes.  Signed: [name], MD [facility] 08/26/2024

## 2024-08-26 NOTE — Anesthesia Postprocedure Evaluation (Signed)
 Anesthesia Post Note  Patient: Emma Rivera  Procedure(s) Performed: COLONOSCOPY  Patient location during evaluation: PACU Anesthesia Type: General Level of consciousness: awake and alert Pain management: pain level controlled Vital Signs Assessment: post-procedure vital signs reviewed and stable Respiratory status: spontaneous breathing, nonlabored ventilation and respiratory function stable Cardiovascular status: blood pressure returned to baseline and stable Postop Assessment: no apparent nausea or vomiting Anesthetic complications: no   No notable events documented.   Last Vitals:  Vitals:   08/26/24 0446 08/26/24 0826  BP: (!) 126/56 (!) 151/62  Pulse: 60 (!) 56  Resp: 16 18  Temp: 36.6 C 36.4 C  SpO2: 97% 97%    Last Pain:  Vitals:   08/26/24 0925  TempSrc:   PainSc: 0-No pain                 Camellia Merilee Louder

## 2024-09-09 ENCOUNTER — Other Ambulatory Visit

## 2024-09-19 ENCOUNTER — Ambulatory Visit: Admitting: Cardiology

## 2024-09-23 ENCOUNTER — Ambulatory Visit: Attending: Cardiology

## 2024-09-23 DIAGNOSIS — R0609 Other forms of dyspnea: Secondary | ICD-10-CM | POA: Diagnosis not present

## 2024-09-23 LAB — ECHOCARDIOGRAM COMPLETE
AR max vel: 1.35 cm2
AV Area VTI: 1.52 cm2
AV Area mean vel: 1.33 cm2
AV Mean grad: 4 mmHg
AV Peak grad: 7.6 mmHg
Ao pk vel: 1.38 m/s
Area-P 1/2: 4.6 cm2
S' Lateral: 1.8 cm

## 2024-09-25 ENCOUNTER — Ambulatory Visit: Admitting: Family Medicine

## 2024-09-26 ENCOUNTER — Ambulatory Visit: Admitting: Family Medicine

## 2024-09-29 ENCOUNTER — Encounter: Payer: Self-pay | Admitting: Cardiology

## 2024-09-29 ENCOUNTER — Ambulatory Visit: Attending: Cardiology | Admitting: Cardiology

## 2024-09-29 VITALS — BP 136/60 | HR 57 | Ht 62.0 in | Wt 179.0 lb

## 2024-09-29 DIAGNOSIS — I10 Essential (primary) hypertension: Secondary | ICD-10-CM

## 2024-09-29 DIAGNOSIS — I4819 Other persistent atrial fibrillation: Secondary | ICD-10-CM

## 2024-09-29 DIAGNOSIS — G4733 Obstructive sleep apnea (adult) (pediatric): Secondary | ICD-10-CM | POA: Diagnosis not present

## 2024-09-29 DIAGNOSIS — R0609 Other forms of dyspnea: Secondary | ICD-10-CM | POA: Diagnosis not present

## 2024-09-29 DIAGNOSIS — R001 Bradycardia, unspecified: Secondary | ICD-10-CM

## 2024-09-29 DIAGNOSIS — R0602 Shortness of breath: Secondary | ICD-10-CM

## 2024-09-29 DIAGNOSIS — Z8719 Personal history of other diseases of the digestive system: Secondary | ICD-10-CM

## 2024-09-29 DIAGNOSIS — E039 Hypothyroidism, unspecified: Secondary | ICD-10-CM

## 2024-09-29 MED ORDER — APIXABAN 2.5 MG PO TABS: 2.5000 mg | ORAL_TABLET | Freq: Two times a day (BID) | ORAL | 3 refills | Status: DC

## 2024-09-29 NOTE — Progress Notes (Signed)
 Cardiology Office Note   Date:  09/29/2024  ID:  Emma Rivera, DOB 1941-10-25, MRN [id number] PCP: Alla Amis, MD  [facility] Providers Cardiologist:  Lonni Hanson, MD Electrophysiologist:  Soyla Gladis Norton, MD     History of Present Illness Emma Rivera is a 83 y.o. female with a past medical history of stroke (12/2018), subsequent diagnosis paroxysmal atrial fibrillation status post ablation (05/2019), COVID (11/2020), hypertension, GERD, esophageal spasm, obstructive sleep apnea, who presents today for follow-up.   Status post A-fib ablation 06/02/2019 and 07/03/2023 for both completed by Dr. Norton.  She was admitted 12/17-19/2024 with atrial fibrillation with RVR.  Amiodarone  was started during her hospitalization.  Ventricular rates were well-controlled at discharge but she remained in atrial fibrillation.  She was last seen in clinic 11/19/2023 and converted to sinus rhythm approximately 3 days prior.  She wears a Fitbit and also has a cardia mobile.  Her pulse was previously 80-90 bpm and dropped 3 days ago into the 50s.  She continues to take amiodarone  200 mg daily and apixaban  5 mg twice daily.  She continued to be noncompliant with CPAP but was advised to follow-up with pulmonary.   She was seen in clinic 12/12/2023, heart failure Concerned about elevated blood pressure.  Blood pressures tend to be 181/92 at home.  She has subsequently restarted 5 mg of amlodipine  that was discontinued during recent hospitalization.  Her losartan  dose was also cut in half from 100 mg to 50 mg daily.  She had followed up with orthopedic provider and an ultrasound was done of her leg that was negative for DVT.  Blood pressure was still optimally controlled at 158/66 156/70 on return.  Her losartan  was increased to 100 mg daily.  She was encouraged to continue to monitor her pressure 1 to 2 hours postmedication administration to evaluate effectiveness of her current medication after adjusting  dose of losartan .  She was seen in clinic 05/05/2024 for routine EP follow-up.  Denied any breakthrough atrial fibrillation.  EKG revealed sinus bradycardia.  She was continued on apixaban  5 mg twice daily for CHA2DS2-VASc of at least 6 and amiodarone  200 mg daily.  Blood pressure continued to be elevated.  She was seen in clinic 08/05/2024 with several issues.  She had progressive worsening shortness of breath over the last several weeks and been using her inhaler to prevent bronchospasms.  She followed up with pulmonary and had several lab studies completed that have been unrevealing.  She has not been started on torsemide  where she states that she had lost 6 pounds in 1 day and felt horrible so she has stopped taking the medication.  She was scheduled for an updated echocardiogram and amiodarone  was discontinued she was also sent for updated labs.  She was last seen in clinic 08/21/2024 accompanied by her husband.  She been extremely dizzy to the point that she had fallen and the left ankle was bruised and swollen into the toes worried she may have broken her ankle possibly her foot.  She had noted she was suffering from enlargement of constipation and took over-the-counter medications.  She had a large quantity of dark tarry stools and some occasional bright red blood in her stool.  She has not taken her diuretic because she becomes lightheaded and noted a drop in her blood pressure.  With worsening progressive shortness of breath with and black tarry stools on blood thinning medication and being hypotensive she was sent to the emergency department for  further evaluation.   She was recently hospitalized at [facility] 10/9 - 08/26/2024 for GI bleeding.  She was found to have orthostatic hypotension and had complained of black tarry stools for 3 to 4 days prior to admission.  She also had a recent fall and sustained injury to her left foot and ankle.  Hemoglobin on admission was 9.5 and dropped to 7.2.  She was  status post EGD on 08/24/2024 which showed a normal esophagus and single gastric polyp with biopsy completed and normal examination of the duodenum.  She was status post colonoscopy on 08/25/2024 which showed diverticulosis in the sigmoid colon with nonbleeding internal hemorrhoids.  Video capsule endoscopy on 08/25/2024 was unrevealing as the battery ran out at 13 hours.  There was no active bleeding or old blood seen in the small bowel.  Outpatient follow-up with a gastroenterologist was recommended.  She was transfused 2 units of PRBCs during her recent hospitalization.  After further discussion of stroke prophylaxis the patient elected to discontinue her apixaban  and understands that she was at increased risk for stroke.  She returns to clinic today stating that overall she has been feeling better than she has in quite some time.  She does continue to suffer from shortness of breath and dyspnea on exertion.  She has the symptoms with minimal exertion.  She recently was hospitalized for an acute GI bleed.  After seeing her primary care provider she was restarted on apixaban  2.5 mg twice daily.  She states that she has discussed that with her family several times but coming off of anticoagulation altogether and she states that she has been out 5-3 and 1 each time.  She denies any reoccurrence of blood in her stool or urine denies any black tarry stools.  States that she has been compliant with her current medication regimen without any undue side effects.  ROS: 10 point review of system has been reviewed and considered negative with exception was been listed in the HPI  Studies Reviewed EKG Interpretation Date/Time:  Monday September 29 2024 14:00:24 EST Ventricular Rate:  57 PR Interval:  164 QRS Duration:  82 QT Interval:  458 QTC Calculation: 445 R Axis:   28  Text Interpretation: Sinus bradycardia When compared with ECG of 21-Aug-2024 10:00, No significant change was found Confirmed by Gerard Frederick ([id number]) on 09/29/2024 2:08:38 PM    Long term monitor, 10/09/2023 Patient had a min HR of 51 bpm, max HR of 210 bpm, and avg HR of 64bpm.  Predominant underlying rhythm was Sinus Rhythm.  37 Supraventricular Tachycardia runs occurred, all less than 17 beats  3.3% supraventricular ectopy Less than 1% ventricular ectopy No patient triggered episodes recorded   Long term monitor, 04/02/2023 Predominant rhythm was sinus rhythm Less than 1% ventricular and supraventricular ectopy burden No atrial fibrillation noted Patient triggered episodes associated with sinus rhythm   TTE, 03/23/2023  1. Left ventricular ejection fraction, by estimation, is 60 to 65%. The left ventricle has normal function. The left ventricle has no regional wall motion abnormalities. Left ventricular diastolic parameters are consistent with Grade II diastolic dysfunction (pseudonormalization).   2. Right ventricular systolic function is normal. The right ventricular size is normal. There is normal pulmonary artery systolic pressure.   3. Left atrial size was moderately dilated.   4. Right atrial size was mildly dilated.   5. The mitral valve is normal in structure. No evidence of mitral valve regurgitation. No evidence of mitral stenosis.   6. The  aortic valve is normal in structure. Aortic valve regurgitation is not visualized. Aortic valve sclerosis/calcification is present, without any evidence of aortic stenosis.   7. The inferior vena cava is normal in size with greater than 50% respiratory variability, suggesting right atrial pressure of 3 mmHg.   Risk Assessment/Calculations  CHA2DS2-VASc Score = 6   This indicates a 9.7% annual risk of stroke. The patient's score is based upon: CHF History: 0 HTN History: 1 Diabetes History: 0 Stroke History: 2 Vascular Disease History: 0 Age Score: 2 Gender Score: 1            Physical Exam VS:  BP 136/60   Pulse (!) 57   Ht 5' 2 (1.575 m)   Wt 179 lb (81.2  kg)   SpO2 95%   BMI 32.74 kg/m        Wt Readings from Last 3 Encounters:  09/29/24 179 lb (81.2 kg)  08/21/24 171 lb 15.3 oz (78 kg)  08/21/24 169 lb 9.6 oz (76.9 kg)    GEN: Well nourished, well developed in no acute distress NECK: No JVD; No carotid bruits CARDIAC: RRR, no murmurs, rubs, gallops RESPIRATORY:  Clear to auscultation without rales, wheezing or rhonchi  ABDOMEN: Soft, non-tender, non-distended EXTREMITIES:   edema; No deformity   ASSESSMENT AND PLAN Recent hospitalization for GI bleed which she denies any reoccurrence of black tarry or red stools.  She was restarted on apixaban  at a reduced dose at 2.5 mg twice daily.  Her last hemoglobin was stable at 11.8 as recently checked by her PCP.  Persistent atrial fibrillation status post ablation with sinus bradycardia with a rate of 57 noted on EKG today without any ischemic changes noted.  She is continued on apixaban  2.5 mg twice daily for CHA2DS2-VASc score of at least 7 until further evaluation.  She is on reduced dosing due to recent GI bleed. (Unfortunately she does not meet reduced dosing criteria).  With recent requirement of blood transfusion she has been continued on apixaban  2.5 mg twice daily at this time.  Would likely need to increase up to 5 mg twice daily at a later date.  Recommend that she follow back up with EP for potential consideration of a Watchman.  Primary hypertension with blood pressure today 136/60.  Blood pressures remain stable.  She is continued on amlodipine  10 mg daily and losartan  50 mg daily and HCTZ 25 mg in the morning.  She has been encouraged to continue to monitor pressures 1 to 2 hours postmedication administration as well.  Shortness of breath with with recent echocardiogram unrevealing with stable LVEF and no valvular abnormalities.  Discussed right heart catheterization today and patient is in agreements would like to have the right heart catheterization completed but she wants to wait  until January.  Sinus bradycardia noted on EKG which is slightly improved with discontinuation of amiodarone .  Will continue to monitor with surveillance studies.  Hypothyroidism which she is continued on levothyroxine with ongoing management per endocrinology.  Obstructive sleep apnea where she does continue to be compliant with CPAP.  Ongoing management per pulmonary       Dispo: Patient to return to clinic to see MD/APP in 2 months or sooner if needed for further evaluation.  At that time patient will be scheduled for right heart catheterization that was discussed today.  Signed, Muzamil Harker, NP

## 2024-09-29 NOTE — Patient Instructions (Signed)
 Medication Instructions:  Your physician recommends the following medication changes.  START TAKING: Eliquis  2.5 mg twice daily  *If you need a refill on your cardiac medications before your next appointment, please call your pharmacy*  Lab Work: No labs ordered today  If you have labs (blood work) drawn today and your tests are completely normal, you will receive your results only by: MyChart Message (if you have MyChart) OR A paper copy in the mail If you have any lab test that is abnormal or we need to change your treatment, we will call you to review the results.  Testing/Procedures: No test ordered today   Follow-Up: At [facility], you and your health needs are our priority.  As part of our continuing mission to provide you with exceptional heart care, our providers are all part of one team.  This team includes your primary Cardiologist (physician) and Advanced Practice Providers or APPs (Physician Assistants and Nurse Practitioners) who all work together to provide you with the care you need, when you need it.  Your next appointment:   2 month(s) - January  Provider:   You may see Lonni Hanson, MD or one of the following Advanced Practice Providers on your designated Care Team:   Tylene Lunch, NP

## 2024-10-01 ENCOUNTER — Telehealth: Payer: Self-pay

## 2024-10-01 NOTE — Telephone Encounter (Signed)
 Called patient to advise that her insurance form had been completed, itemized bill would be mailed to patient's home address - Left message on voicemail

## 2024-12-03 ENCOUNTER — Encounter: Payer: Self-pay | Admitting: Cardiology

## 2024-12-03 ENCOUNTER — Ambulatory Visit: Attending: Cardiology | Admitting: Cardiology

## 2024-12-03 VITALS — BP 130/80 | HR 89 | Ht 62.0 in | Wt 179.2 lb

## 2024-12-03 DIAGNOSIS — I10 Essential (primary) hypertension: Secondary | ICD-10-CM

## 2024-12-03 DIAGNOSIS — G4733 Obstructive sleep apnea (adult) (pediatric): Secondary | ICD-10-CM

## 2024-12-03 DIAGNOSIS — R0609 Other forms of dyspnea: Secondary | ICD-10-CM | POA: Diagnosis not present

## 2024-12-03 DIAGNOSIS — R0602 Shortness of breath: Secondary | ICD-10-CM

## 2024-12-03 DIAGNOSIS — E039 Hypothyroidism, unspecified: Secondary | ICD-10-CM | POA: Diagnosis not present

## 2024-12-03 DIAGNOSIS — I4819 Other persistent atrial fibrillation: Secondary | ICD-10-CM

## 2024-12-03 NOTE — Patient Instructions (Signed)
 Medication Instructions:   Your physician recommends that you continue on your current medications as directed. Please refer to the Current Medication list given to you today.    *If you need a refill on your cardiac medications before your next appointment, please call your pharmacy*  Lab Work:  Your provider would like for you to have following labs drawn today BMet, CBC.    If you have labs (blood work) drawn today and your tests are completely normal, you will receive your results only by:  MyChart Message (if you have MyChart) OR  A paper copy in the mail If you have any lab test that is abnormal or we need to change your treatment, we will call you to review the results.  Testing/Procedures:   [place] A DE[facility] OF . [facility] [place] [address] [place] [address] Dept: [phone] Loc: [phone]  Emma Rivera  12/03/2024  You are scheduled for a Cardiac Catheterization on Tuesday, February 10 with Dr. Lonni End.  1. Please arrive at the [facility] Entrance of [facility], [address], [place] [address] at 6:30 AM (This is 1 hour(s) prior to your procedure time).  Proceed to the Check-In Desk directly inside the entrance.  Procedure Parking: Use the entrance off of the [address] side of the hospital. Turn right upon entering and follow the driveway to parking that is directly in front of the [facility] Center. There is no valet parking available at this entrance, however there is an awning directly in front of the [facility] Vascular Center for drop off/ pick up for patients.  Special note: Every effort is made to have your procedure done on time. Please understand that emergencies sometimes delay scheduled procedures.  2. Diet: NPO: Nothing to eat OR drink after midnight. (For TEE and Cath the same day)   3. Hydration: You need to be well hydrated before  your procedure. On February 10, you may drink approved liquids (see below) until 2 hours before the procedure, with 16 oz of water as your last intake.   List of approved liquids water, clear juice, clear tea, black coffee, fruit juices, non-citric and without pulp, carbonated beverages, Gatorade, Kool -Aid, plain Jello-O and plain ice popsicles.  4. Labs: You will need to have blood drawn on Wednesday, January 21 at [facility], Go to 1st desk on your right to register.  Address: [address] [place], [place] [address]  Open: 8am - 5pm  Phone: [phone]. You do not need to be fasting.  5. Medication instructions in preparation for your procedure:   Contrast Allergy: No   Current Outpatient Medications (Endocrine & Metabolic):    levothyroxine  (SYNTHROID ) 50 MCG tablet, Take 50 mcg by mouth daily at 2 am.  Current Outpatient Medications (Cardiovascular):    amLODipine  (NORVASC ) 10 MG tablet, Take 10 mg by mouth daily.   atorvastatin  (LIPITOR) 20 MG tablet, TAKE 1 TABLET (20 MG TOTAL) BY MOUTH DAILY AT 6 PM.   EPINEPHrine  0.3 mg/0.3 mL IJ SOAJ injection, Inject 0.3 mg into the muscle as needed.   hydrochlorothiazide  (HYDRODIURIL ) 25 MG tablet, Take 25 mg by mouth in the morning.   losartan  (COZAAR ) 50 MG tablet, Take 50 mg by mouth daily.   torsemide  (DEMADEX ) 10 MG tablet, Take 1 tablet (10 mg total) by mouth 3 (three) times a week. Patient only takes 3  Current  Outpatient Medications (Respiratory):    albuterol  (VENTOLIN  HFA) 108 (90 Base) MCG/ACT inhaler, Inhale 1-2 puffs into the lungs every 6 (six) hours as needed (exercise/wheezing/shortness of breath.).   FASENRA PEN 30 MG/ML prefilled autoinjector, Inject 30 mg into the skin every 30 (thirty) days. (Patient taking differently: Inject 30 mg into the skin every 2 (two) months.)   loratadine  (CLARITIN ) 10 MG tablet, Take 10 mg by mouth daily as needed for allergies.  Current Outpatient Medications (Analgesics):     acetaminophen  (TYLENOL ) 500 MG tablet, Take 1,000 mg by mouth every 6 (six) hours as needed for moderate pain or headache.  Current Outpatient Medications (Hematological):    apixaban  (ELIQUIS ) 2.5 MG TABS tablet, Take 1 tablet (2.5 mg total) by mouth 2 (two) times daily.  Current Outpatient Medications (Other):    Calcium  Carbonate-Vitamin D (CALCIUM -D PO), Take 2 tablets by mouth daily.   Carboxymethylcellul-Glycerin (LUBRICATING EYE DROPS OP), Place 1 drop into both eyes daily as needed (dry eyes).   citalopram  (CELEXA ) 20 MG tablet, Take 20 mg by mouth in the morning.   Multiple Vitamin (MULTIVITAMIN) tablet, Take 1 tablet by mouth in the morning.   omeprazole (PRILOSEC) 20 MG capsule, Take 20 mg by mouth daily before breakfast.   tolterodine (DETROL) 2 MG tablet, Take 2 mg by mouth 2 (two) times daily. (Patient not taking: Reported on 12/03/2024) *For reference purposes while preparing patient instructions.   Delete this med list prior to printing instructions for patient.*  6. Plan to go home the same day, you will only stay overnight if medically necessary. 7. Bring a current list of your medications and current insurance cards. 8. You MUST have a responsible person to drive you home. 9. Someone MUST be with you the first 24 hours after you arrive home or your discharge will be delayed. 10. Please wear clothes that are easy to get on and off and wear slip-on shoes.  Thank you for allowing us  to care for you!   -- [facility]   Referrals:  None ordered at this time   Follow-Up:  At [facility], you and your health needs are our priority.  As part of our continuing mission to provide you with exceptional heart care, our providers are all part of one team.  This team includes your primary Cardiologist (physician) and Advanced Practice Providers or APPs (Physician Assistants and Nurse Practitioners) who all work together to provide you  with the care you need, when you need it.  Your next appointment:   6 week(s)  Provider:    Lonni Hanson, MD or Tylene Lunch, NP    We recommend signing up for the patient portal called MyChart.  Sign up information is provided on this After Visit Summary.  MyChart is used to connect with patients for Virtual Visits (Telemedicine).  Patients are able to view lab/test results, encounter notes, upcoming appointments, etc.  Non-urgent messages can be sent to your provider as well.   To learn more about what you can do with MyChart, go to [email or web address].

## 2024-12-03 NOTE — Progress Notes (Signed)
 " Cardiology Office Note   Date:  12/03/2024  ID:  Emma Rivera, DOB 09/08/1941, MRN [id number] PCP: Alla Amis, MD  [facility] Providers Cardiologist:  Lonni Hanson, MD Electrophysiologist:  Soyla Gladis Norton, MD     History of Present Illness Emma Rivera is a 84 y.o. female with a past medical history of stroke (12/2018), paroxysmal atrial fibrillation status post ablation (05/2019), COVID (11/2020), hypertension, GERD, esophageal spasm, obstructive sleep apnea, who presents today for follow-up.   Status post A-fib ablation 06/02/2019 and 07/03/2023 for both completed by Dr. Norton.  She was admitted 12/17-19/2024 with atrial fibrillation with RVR.  Amiodarone  was started during her hospitalization.  Ventricular rates were well-controlled at discharge but she remained in atrial fibrillation.  She was last seen in clinic 11/19/2023 and converted to sinus rhythm approximately 3 days prior.  She wears a Fitbit and also has a cardia mobile.  Her pulse was previously 80-90 bpm and dropped 3 days ago into the 50s.  She continues to take amiodarone  200 mg daily and apixaban  5 mg twice daily.  She continued to be noncompliant with CPAP but was advised to follow-up with pulmonary.   She was seen in clinic 12/12/2023, heart failure Concerned about elevated blood pressure.  Blood pressures tend to be 181/92 at home.  She has subsequently restarted 5 mg of amlodipine  that was discontinued during recent hospitalization.  Her losartan  dose was also cut in half from 100 mg to 50 mg daily.  She had followed up with orthopedic provider and an ultrasound was done of her leg that was negative for DVT.  Blood pressure was still optimally controlled at 158/66 156/70 on return.  Her losartan  was increased to 100 mg daily.  She was encouraged to continue to monitor her pressure 1 to 2 hours postmedication administration to evaluate effectiveness of her current medication after adjusting dose of  losartan .  She was seen in clinic 05/05/2024 for routine EP follow-up.  Denied any breakthrough atrial fibrillation.  EKG revealed sinus bradycardia.  She was continued on apixaban  5 mg twice daily for CHA2DS2-VASc of at least 6 and amiodarone  200 mg daily.  Blood pressure continued to be elevated.  She was seen in clinic 08/05/2024 with several issues.  She had progressive worsening shortness of breath over the last several weeks and been using her inhaler to prevent bronchospasms.  She followed up with pulmonary and had several lab studies completed that have been unrevealing.  She has not been started on torsemide  where she states that she had lost 6 pounds in 1 day and felt horrible so she has stopped taking the medication.  She was scheduled for an updated echocardiogram and amiodarone  was discontinued she was also sent for updated labs.  She was last seen in clinic 08/21/2024 accompanied by her husband.  She been extremely dizzy to the point that she had fallen and the left ankle was bruised and swollen into the toes worried she may have broken her ankle possibly her foot.  She had noted she was suffering from enlargement of constipation and took over-the-counter medications.  She had a large quantity of dark tarry stools and some occasional bright red blood in her stool.  She has not taken her diuretic because she becomes lightheaded and noted a drop in her blood pressure.  With worsening progressive shortness of breath with and black tarry stools on blood thinning medication and being hypotensive she was sent to the emergency department for further  evaluation.  She was recently hospitalized at [facility] 10/9 - 08/26/2024 for GI bleeding. She was found to have orthostatic hypotension and had complained of black tarry stools for 3 to 4 days prior to admission. She also had a recent fall and sustained injury to her left foot and ankle. Hemoglobin on admission was 9.5 and dropped to 7.2. She was status post EGD  on 08/24/2024 which showed a normal esophagus and single gastric polyp with biopsy completed and normal examination of the duodenum. She was status post colonoscopy on 08/25/2024 which showed diverticulosis in the sigmoid colon with nonbleeding internal hemorrhoids. Video capsule endoscopy on 08/25/2024 was unrevealing as the battery ran out at 13 hours. There was no active bleeding or old blood seen in the small bowel. Outpatient follow-up with a gastroenterologist was recommended. She was transfused 2 units of PRBCs during her recent hospitalization. After further discussion of stroke prophylaxis the patient elected to discontinue her apixaban  and understands that she was at increased risk for stroke.   She was last seen in clinic 09/29/2024 stating she been feeling better than she has in quite some time.  Continues to suffer from shortness of breath and dyspnea on exertion.  She has symptoms with minimal exertion.  She was recently hospitalized for acute GI bleed.  She is here primary care provide she was restarted on apixaban  2.5 mg twice daily.  She states that she discussed with her family several times.  She was recommended to continue to follow-up with EP to discuss potential watchman.  She was also encouraged to follow-up with pulmonary.  We discussed potentially right heart catheterization if she continues with worsening shortness of breath.   She returns to clinic today accompanied by her husband.  She continues to have shortness of breath and dyspnea on exertion with minimal exertion.  She has complaints today of converting back into atrial fibrillation.  States that she has been compliant with her current medications.  States she has not missed any doses of her apixaban .  Denies any bleeding with no blood noted in her urine or stool.  She states that several weeks ago she did have an episode where she was just standing at the kitchen sink and just fell on the floor she remembers falling and hitting  did not hit her head so she does not believe that she passed out.  She has had no reoccurrence.  Has continued to follow-up with pulmonary and has recently been weaned off of steroids.  Denies any recent hospitalizations or visits to the emergency department.  ROS: 10 point review of system was reviewed and considered negative the exception was been listed in the HPI  Studies Reviewed EKG Interpretation Date/Time:  Wednesday December 03 2024 10:40:38 EST Ventricular Rate:  89 PR Interval:    QRS Duration:  78 QT Interval:  378 QTC Calculation: 459 R Axis:   41  Text Interpretation: Atrial fibrillation Nonspecific ST abnormality When compared with ECG of 29-Sep-2024 14:00, Confirmed by Gerard Frederick ([id number]) on 12/03/2024 10:42:12 AM    Long term monitor, 10/09/2023 Patient had a min HR of 51 bpm, max HR of 210 bpm, and avg HR of 64bpm.  Predominant underlying rhythm was Sinus Rhythm.  37 Supraventricular Tachycardia runs occurred, all less than 17 beats  3.3% supraventricular ectopy Less than 1% ventricular ectopy No patient triggered episodes recorded   Long term monitor, 04/02/2023 Predominant rhythm was sinus rhythm Less than 1% ventricular and supraventricular ectopy burden No atrial fibrillation noted  Patient triggered episodes associated with sinus rhythm   TTE, 03/23/2023  1. Left ventricular ejection fraction, by estimation, is 60 to 65%. The left ventricle has normal function. The left ventricle has no regional wall motion abnormalities. Left ventricular diastolic parameters are consistent with Grade II diastolic dysfunction (pseudonormalization).   2. Right ventricular systolic function is normal. The right ventricular size is normal. There is normal pulmonary artery systolic pressure.   3. Left atrial size was moderately dilated.   4. Right atrial size was mildly dilated.   5. The mitral valve is normal in structure. No evidence of mitral valve regurgitation. No evidence of  mitral stenosis.   6. The aortic valve is normal in structure. Aortic valve regurgitation is not visualized. Aortic valve sclerosis/calcification is present, without any evidence of aortic stenosis.   7. The inferior vena cava is normal in size with greater than 50% respiratory variability, suggesting right atrial pressure of 3 mmHg.   Risk Assessment/Calculations  CHA2DS2-VASc Score = 6   This indicates a 9.7% annual risk of stroke. The patient's score is based upon: CHF History: 0 HTN History: 1 Diabetes History: 0 Stroke History: 2 Vascular Disease History: 0 Age Score: 2 Gender Score: 1            Physical Exam VS:  BP 130/80 (BP Location: Left Arm, Patient Position: Sitting, Cuff Size: Large)   Pulse 89   Ht 5' 2 (1.575 m)   Wt 179 lb 4 oz (81.3 kg)   SpO2 96%   BMI 32.79 kg/m        Wt Readings from Last 3 Encounters:  12/03/24 179 lb 4 oz (81.3 kg)  09/29/24 179 lb (81.2 kg)  08/21/24 171 lb 15.3 oz (78 kg)    GEN: Well nourished, well developed in no acute distress NECK: No JVD; No carotid bruits CARDIAC: IR IR, no murmurs, rubs, gallops RESPIRATORY:  Clear to auscultation without rales, wheezing or rhonchi  ABDOMEN: Soft, non-tender, non-distended EXTREMITIES: Trace pretibial edema; No deformity   ASSESSMENT AND PLAN Persistent atrial fibrillation status post ablation which she is noted to be back in atrial fibrillation that is rate controlled on EKG today.  She is continued on apixaban  2.5 mg twice daily for CHA2DS2-VASc score of at least 6 for stroke prophylaxis.  She is on reduced dosing with history of recent GI bleed which required blood transfusions.  Unfortunately she does not meet reduced dosing criteria and will likely need an increase back to 5 mg twice daily at a later date.  Did discuss restarting amiodarone  which was previously stopped due to her shortness of breath with concern for amiodarone  toxicity.  Patient stated with ongoing shortness of breath  and dyspnea on exertion even off of amiodarone  she was doubling the amiodarone  caused her shortness of breath but she declines restarting therapy at this time.  She has follow-up with EP later this week.  She has also been advised to discuss with them repeat ablation and potential consideration for watchman.  Primary hypertension with a blood pressure today 130/88.  Blood pressure has been well-controlled.  She is continued on amlodipine  10 mg daily, hydrochlorothiazide  25 mg daily, losartan  50 mg daily.  She has taken torsemide  on an as needed basis.  She has been encouraged to continue to monitor pressures 1 to 2 hours postmedication administration at home as well.  Shortness of breath with prior echocardiogram unrevealing with stable LVEF and no valvular abnormalities.  With ongoing progressive shortness of  breath and dyspnea on exertion that is unchanged even with medication changes by her pulmonologist she has been scheduled for right heart catheterization to better understand hemodynamics and to guide care.  Hypothyroidism which she has continued on levothyroxine  with ongoing management per endocrinology her last TSH was little over 6.  Obstructive sleep apnea which she states she continues to be compliant with CPAP.  Ongoing manage per pulmonary     Informed Consent   Shared Decision Making/Informed Consent The risks, including but not limited to, [bleeding or vascular complications (1 in 500), pneumothorax (1 in 1600), arrhythmia (1 in 1000) and death (1 in 5000)], benefits (diagnostic support and/or management of heart failure, pulmonary hypertension) and alternatives of a right heart catheterization were discussed in detail with Ms. Bouchie and she is willing to proceed.     Dispo: Patient to return to clinic to see MD/APP 3 to 4 weeks postprocedure or sooner if needed for further evaluation.  Signed, Davan Hark, NP   "

## 2024-12-04 ENCOUNTER — Ambulatory Visit: Payer: Self-pay | Admitting: Cardiology

## 2024-12-04 LAB — CBC
Hematocrit: 40.9 % (ref 34.0–46.6)
Hemoglobin: 13 g/dL (ref 11.1–15.9)
MCH: 31.7 pg (ref 26.6–33.0)
MCHC: 31.8 g/dL (ref 31.5–35.7)
MCV: 100 fL — ABNORMAL HIGH (ref 79–97)
Platelets: 241 x10E3/uL (ref 150–450)
RBC: 4.1 x10E6/uL (ref 3.77–5.28)
RDW: 13.1 % (ref 11.7–15.4)
WBC: 6.4 x10E3/uL (ref 3.4–10.8)

## 2024-12-04 LAB — BASIC METABOLIC PANEL WITH GFR
BUN/Creatinine Ratio: 18 (ref 12–28)
BUN: 19 mg/dL (ref 8–27)
CO2: 24 mmol/L (ref 20–29)
Calcium: 9.7 mg/dL (ref 8.7–10.3)
Chloride: 101 mmol/L (ref 96–106)
Creatinine, Ser: 1.07 mg/dL — ABNORMAL HIGH (ref 0.57–1.00)
Glucose: 85 mg/dL (ref 70–99)
Potassium: 4.6 mmol/L (ref 3.5–5.2)
Sodium: 141 mmol/L (ref 134–144)
eGFR: 52 mL/min/1.73 — ABNORMAL LOW

## 2024-12-04 NOTE — Progress Notes (Signed)
 Preprocedure labs have remained stable.  Kidney function appears to be back to baseline.  Hemoglobin has remained stable and actually improved from 10-13.  Continue current medication regimen without changes at this time.

## 2024-12-04 NOTE — Progress Notes (Unsigned)
 "     Electrophysiology Clinic Note    Date:  12/05/2024  Patient ID:  Emma Rivera 1941-10-15, MRN [id number] PCP:  Alla Amis, MD  Cardiologist:  Lonni Hanson, MD  Electrophysiologist:  Soyla Gladis Norton, MD    Discussed the use of AI scribe software for clinical note transcription with the patient, who gave verbal consent to proceed.   Patient Profile    Chief Complaint: AFib  History of Present Illness: Emma Rivera is a 84 y.o. female with PMH notable for persis AFib, HTN, HLD, CVA, CPAP on OSA, GI bleed; seen today for Emma Gladis Norton, MD for routine electrophysiology followup.   She is s/p AF ablation x 2 (2020, 2024) by Dr. Norton.  She had recurrence of AF and was started on amiodarone .  She last saw Dr. Norton 01/2024 where she was happy with her level of AF control and so no plans to change management.   She presented to ER 08/2024 for dark stools x days w hypotension. Rec'd 2u PRBC,  GI workup without bleeding source. Eliquis  stopped at discharge, and later restarted at 2.5mg  dose (though does not meet criteria for dose reduction).  She recently saw NP Hammock 1/21 where she was noted to be back in AFib. She sent follow-up message 1/22 where she was back in sinus.   Today, she tells me that she think she converted to AFib on 1/19. This is the first episode of AFib she has had since stopping amiodarone  in August. She recalls amiodarone  being stopped d/t thyroid  abnormalities, though there are notes from pulm regarding concerns for amio lung toxicity. During this week's AF, her HR got up to 110 with activity and when she lay down at night, felt like she would bounce off the bed d/t her heart racing.   She continues to take eliquis  BID, no bleeding concerns.   She has RHC planned soon to further eval her DOE.      Arrhythmia/Device History Amiodarone  - started 10/2023, stopped 06/2024 Flecainide  - stopped d/t dizziness     ROS:  Please see the  history of present illness. All other systems are reviewed and otherwise negative.    Physical Exam    VS:  BP 120/60 (BP Location: Left Arm, Patient Position: Sitting, Cuff Size: Normal)   Pulse 70   Ht 5' 2 (1.575 m)   Wt 182 lb 3.2 oz (82.6 kg)   SpO2 98%   BMI 33.32 kg/m  BMI: Body mass index is 33.32 kg/m.           Wt Readings from Last 3 Encounters:  12/05/24 182 lb 3.2 oz (82.6 kg)  12/03/24 179 lb 4 oz (81.3 kg)  09/29/24 179 lb (81.2 kg)     GEN- The patient is well appearing, alert and oriented x 3 today.   Lungs- Clear to ausculation bilaterally, normal work of breathing.  Heart- Regular rate and rhythm w rare ectopy, no murmurs, rubs or gallops Extremities- Trace peripheral edema, warm, dry   Studies Reviewed   Previous EP, cardiology notes.    EKG is ordered. Personal review of EKG from today shows:  SR w PAC, rate 68        TTE, 09/23/2024  1. Left ventricular ejection fraction, by estimation, is 60 to 65%. Left ventricular ejection fraction by 3D volume is 61 %. The left ventricle has normal function. Left ventricular endocardial border not optimally defined to evaluate regional wall motion. Left ventricular diastolic  parameters are consistent with Grade III diastolic dysfunction (restrictive). The average left ventricular global longitudinal strain is -21.9 %. The global longitudinal strain is  normal.   2. Right ventricular systolic function is normal. The right ventricular size is normal. There is mildly elevated pulmonary artery systolic pressure. The estimated right ventricular systolic pressure is 39.2 mmHg.   3. The mitral valve is normal in structure. Trivial mitral valve regurgitation. No evidence of mitral stenosis.   4. The aortic valve was not well visualized. Aortic valve regurgitation is not visualized. No aortic stenosis is present.   5. The inferior vena cava is normal in size with greater than 50% respiratory variability, suggesting right  atrial pressure of 3 mmHg.   Long term monitor, 10/09/2023 Patient had a min HR of 51 bpm, max HR of 210 bpm, and avg HR of 64bpm.  Predominant underlying rhythm was Sinus Rhythm.  37 Supraventricular Tachycardia runs occurred, all less than 17 beats  3.3% supraventricular ectopy Less than 1% ventricular ectopy No patient triggered episodes recorded   Long term monitor, 04/02/2023 Predominant rhythm was sinus rhythm Less than 1% ventricular and supraventricular ectopy burden No atrial fibrillation noted Patient triggered episodes associated with sinus rhythm   TTE, 03/23/2023  1. Left ventricular ejection fraction, by estimation, is 60 to 65%. The left ventricle has normal function. The left ventricle has no regional wall motion abnormalities. Left ventricular diastolic parameters are consistent with Grade II diastolic dysfunction (pseudonormalization).   2. Right ventricular systolic function is normal. The right ventricular size is normal. There is normal pulmonary artery systolic pressure.   3. Left atrial size was moderately dilated.   4. Right atrial size was mildly dilated.   5. The mitral valve is normal in structure. No evidence of mitral valve regurgitation. No evidence of mitral stenosis.   6. The aortic valve is normal in structure. Aortic valve regurgitation is not visualized. Aortic valve sclerosis/calcification is present, without any evidence of aortic stenosis.   7. The inferior vena cava is normal in size with greater than 50% respiratory variability, suggesting right atrial pressure of 3 mmHg.    Assessment and Plan     #) persis Afib S/p AF ablation x 2, most recently 2024 by Dr. Inocencio She was previously on amiodarone  and maintaining sinus rhythm, this was stopped 06/2024 This week's AF episode was first AF since stopping amiodarone .  We discussed restarting amiodarone  and treating hypothyroid, redo ablation, or continuing to monitor and consider starting tikosyn.   At this time, patient prefers to continue to monitor AF burden off AAD Emma start 25mg  lopressor  PRN during AF    #) Hypercoag d/t persis afib #) h/o CVA #) h/o GI bleed CHA2DS2-VASc Score = at least 6 [CHF History: 0, HTN History: 1, Diabetes History: 0, Stroke History: 2, Vascular Disease History: 0, Age Score: 2, Gender Score: 1].  Therefore, the patient's annual risk of stroke is 9.7 %.     Has Bled score = 5, 12.5% annual major bleeding risk  Had GI bleed recently requiring transfusion We briefly talked about watchman procedure, patient wanted to discuss further with husband Emma increase eliquis  to 5mg  BID given she is having recurrence of AFib        Current medicines are reviewed at length with the patient today.   The patient does not have concerns regarding her medicines.  The following changes were made today:   INCREASE eliquis  to 5mg  twice a day  Labs/ tests ordered  today include:  Orders Placed This Encounter  Procedures   EKG 12-Lead     Disposition: Follow up with Dr. Kennyth or EP APP in 4 weeks   Signed, Chantal Needle, NP  12/05/24  12:18 PM  Electrophysiology [facility] "

## 2024-12-05 ENCOUNTER — Encounter: Payer: Self-pay | Admitting: Cardiology

## 2024-12-05 ENCOUNTER — Ambulatory Visit: Attending: Cardiology | Admitting: Cardiology

## 2024-12-05 ENCOUNTER — Other Ambulatory Visit: Payer: Self-pay | Admitting: Cardiology

## 2024-12-05 VITALS — BP 120/60 | HR 70 | Ht 62.0 in | Wt 182.2 lb

## 2024-12-05 DIAGNOSIS — Z8719 Personal history of other diseases of the digestive system: Secondary | ICD-10-CM | POA: Diagnosis not present

## 2024-12-05 DIAGNOSIS — R0602 Shortness of breath: Secondary | ICD-10-CM

## 2024-12-05 DIAGNOSIS — D6869 Other thrombophilia: Secondary | ICD-10-CM | POA: Diagnosis not present

## 2024-12-05 DIAGNOSIS — Z8673 Personal history of transient ischemic attack (TIA), and cerebral infarction without residual deficits: Secondary | ICD-10-CM

## 2024-12-05 DIAGNOSIS — I4819 Other persistent atrial fibrillation: Secondary | ICD-10-CM

## 2024-12-05 MED ORDER — APIXABAN 5 MG PO TABS: 5.0000 mg | ORAL_TABLET | Freq: Two times a day (BID) | ORAL | 3 refills | Status: AC

## 2024-12-05 MED ORDER — METOPROLOL TARTRATE 25 MG PO TABS: 25.0000 mg | ORAL_TABLET | ORAL | 3 refills | Status: DC | PRN

## 2024-12-05 MED ORDER — APIXABAN 5 MG PO TABS: 5.0000 mg | ORAL_TABLET | Freq: Two times a day (BID) | ORAL | Status: DC

## 2024-12-05 NOTE — Patient Instructions (Addendum)
" °  Medication Instructions:  Your physician recommends the following medication changes.   START TAKING:  Lopressor  25 mg as needed for afib.   INCREASE: Eliquis  to 5 mg twice a day.  *If you need a refill on your cardiac medications before your next appointment, please call your pharmacy*  Lab Work: No labs ordered today    Testing/Procedures: No test ordered today   Follow-Up: At [facility], you and your health needs are our priority.  As part of our continuing mission to provide you with exceptional heart care, our providers are all part of one team.  This team includes your primary Cardiologist (physician) and Advanced Practice Providers or APPs (Physician Assistants and Nurse Practitioners) who all work together to provide you with the care you need, when you need it.  Your next appointment:   1 month(s)  Provider:   Suzann Riddle, NP         Dofetilide (Tikosyn) Hospital Admission  Preparing for admission:  Check with drug insurance company for cost of drug to ensure affordability --- Price check the generic of Tikosyn which is Dofetilide 500 mcg twice a day.   GoodRx is an option if insurance copay is unaffordable. Patient assistance is also available.  A pharmacist will review all your medications for potential interactions with Tikosyn. If any medication changes are needed prior to admission we will be in touch with you.  If any new medications are started AFTER your admission date is set with Glade RN [phone]). Please notify our office immediately so your medication list can be updated and reviewed by our pharmacist again.  Please ensure no missed doses of your anticoagulation (blood thinner) for 3 weeks prior to admission. If a dose is missed please notify our office immediately.  If you are on coumadin/warfarin we will require 4 weekly therapeutic INR checks prior to admission.  No Benadryl  is allowed 3 days prior to admission.   On day of  admission:  Afib Clinic office visit will be scheduled on the morning of admission for preliminary labs and EKG.  Please take your morning medications and have a normal breakfast prior to arrival to the appointment.     Tikosyn initiation requires a 3 night/4 day hospital stay with constant telemetry monitoring. You will have an EKG after each dose of Tikosyn as well as daily lab draws.  You may bring personal belongings/clothing with you to the hospital. Please leave your suitcase in the car until you arrive in admissions.  If the drug does not convert you to normal rhythm a cardioversion will be performed after the 4th dose of Tikosyn.   If you use a CPAP machine; please bring this with you to the hospital.   Time of admission is dependent on bed availability in the hospital. In some instances, you will be sent home until bed is available. Rarely admission can be delayed to the following day if hospital census prevents available beds.  Questions please call our office at [phone]  "

## 2024-12-16 ENCOUNTER — Encounter: Payer: Self-pay | Admitting: Internal Medicine

## 2024-12-16 ENCOUNTER — Other Ambulatory Visit: Payer: Self-pay | Admitting: Cardiology

## 2024-12-23 ENCOUNTER — Ambulatory Visit: Admission: RE | Admit: 2024-12-23 | Admitting: Internal Medicine

## 2024-12-23 ENCOUNTER — Encounter: Admission: RE | Payer: Self-pay | Source: Home / Self Care

## 2024-12-23 DIAGNOSIS — R0602 Shortness of breath: Secondary | ICD-10-CM

## 2024-12-23 SURGERY — RIGHT HEART CATH
Anesthesia: Moderate Sedation | Laterality: Right

## 2025-01-07 ENCOUNTER — Ambulatory Visit: Admitting: Cardiology

## 2025-02-06 ENCOUNTER — Ambulatory Visit: Admitting: Cardiology
